# Patient Record
Sex: Male | Born: 1940 | Race: White | Hispanic: No | Smoking: Former smoker
Health system: Southern US, Community
[De-identification: ages and names within clinical notes are randomized; demographics above are authoritative.]

## PROBLEM LIST (undated history)

## (undated) DIAGNOSIS — E785 Hyperlipidemia, unspecified: Secondary | ICD-10-CM

## (undated) DIAGNOSIS — M1990 Unspecified osteoarthritis, unspecified site: Secondary | ICD-10-CM

## (undated) DIAGNOSIS — G4733 Obstructive sleep apnea (adult) (pediatric): Secondary | ICD-10-CM

## (undated) DIAGNOSIS — R0600 Dyspnea, unspecified: Secondary | ICD-10-CM

## (undated) DIAGNOSIS — R351 Nocturia: Secondary | ICD-10-CM

## (undated) DIAGNOSIS — I739 Peripheral vascular disease, unspecified: Secondary | ICD-10-CM

## (undated) DIAGNOSIS — G629 Polyneuropathy, unspecified: Secondary | ICD-10-CM

## (undated) DIAGNOSIS — I2510 Atherosclerotic heart disease of native coronary artery without angina pectoris: Secondary | ICD-10-CM

## (undated) DIAGNOSIS — N400 Enlarged prostate without lower urinary tract symptoms: Secondary | ICD-10-CM

## (undated) DIAGNOSIS — I10 Essential (primary) hypertension: Secondary | ICD-10-CM

## (undated) DIAGNOSIS — R251 Tremor, unspecified: Secondary | ICD-10-CM

## (undated) DIAGNOSIS — C4490 Unspecified malignant neoplasm of skin, unspecified: Secondary | ICD-10-CM

## (undated) DIAGNOSIS — R0989 Other specified symptoms and signs involving the circulatory and respiratory systems: Secondary | ICD-10-CM

## (undated) HISTORY — DX: Nocturia: R35.1

## (undated) HISTORY — DX: Peripheral vascular disease, unspecified: I73.9

## (undated) HISTORY — DX: Unspecified osteoarthritis, unspecified site: M19.90

## (undated) HISTORY — DX: Tremor, unspecified: R25.1

## (undated) HISTORY — DX: Benign prostatic hyperplasia without lower urinary tract symptoms: N40.0

## (undated) HISTORY — DX: Hyperlipidemia, unspecified: E78.5

## (undated) HISTORY — PX: CARDIAC CATHETERIZATION: SHX172

## (undated) HISTORY — DX: Other specified symptoms and signs involving the circulatory and respiratory systems: R09.89

## (undated) HISTORY — DX: Essential (primary) hypertension: I10

## (undated) HISTORY — DX: Obstructive sleep apnea (adult) (pediatric): G47.33

## (undated) HISTORY — DX: Polyneuropathy, unspecified: G62.9

## (undated) HISTORY — DX: Unspecified malignant neoplasm of skin, unspecified: C44.90

---

## 2004-03-24 HISTORY — PX: CORONARY ARTERY BYPASS GRAFT: SHX141

## 2004-08-20 ENCOUNTER — Ambulatory Visit: Payer: Self-pay | Admitting: Internal Medicine

## 2005-04-09 ENCOUNTER — Ambulatory Visit: Payer: Self-pay | Admitting: Internal Medicine

## 2005-04-14 ENCOUNTER — Ambulatory Visit: Payer: Self-pay | Admitting: Internal Medicine

## 2005-05-09 ENCOUNTER — Ambulatory Visit: Payer: Self-pay | Admitting: *Deleted

## 2005-05-22 ENCOUNTER — Ambulatory Visit: Payer: Self-pay | Admitting: Family Medicine

## 2005-06-02 ENCOUNTER — Ambulatory Visit: Payer: Self-pay | Admitting: *Deleted

## 2006-08-05 ENCOUNTER — Ambulatory Visit: Payer: Self-pay | Admitting: Vascular Surgery

## 2006-09-30 ENCOUNTER — Ambulatory Visit: Payer: Self-pay | Admitting: Vascular Surgery

## 2007-04-23 ENCOUNTER — Ambulatory Visit: Payer: Self-pay | Admitting: Urology

## 2008-11-13 ENCOUNTER — Other Ambulatory Visit: Payer: Self-pay | Admitting: Internal Medicine

## 2008-11-21 ENCOUNTER — Ambulatory Visit: Payer: Self-pay | Admitting: Internal Medicine

## 2011-09-30 HISTORY — PX: CAROTID ARTERY ANGIOPLASTY: SHX1300

## 2012-01-05 ENCOUNTER — Ambulatory Visit: Payer: Self-pay | Admitting: Vascular Surgery

## 2012-01-05 LAB — BASIC METABOLIC PANEL
Anion Gap: 7 (ref 7–16)
Calcium, Total: 9 mg/dL (ref 8.5–10.1)
EGFR (African American): >60
EGFR (Non-African Amer.): >60
Glucose: 152 mg/dL — ABNORMAL HIGH (ref 65–99)
Osmolality: 288 (ref 275–301)
Potassium: 3.8 mmol/L (ref 3.5–5.1)

## 2013-07-30 LAB — LIPID PANEL
CHOLESTEROL: 115 mg/dL (ref 0–200)
HDL: 31 mg/dL — AB (ref 35–70)
LDL Cholesterol: 63 mg/dL
TRIGLYCERIDES: 104 mg/dL (ref 40–160)

## 2013-07-30 LAB — HEPATIC FUNCTION PANEL
ALK PHOS: 71 U/L (ref 25–125)
ALT: 25 U/L (ref 10–40)
AST: 23 U/L (ref 14–40)
Bilirubin, Direct: 0.5 mg/dL — AB
Bilirubin, Total: 0.5 mg/dL

## 2013-07-30 LAB — BASIC METABOLIC PANEL
BUN: 15 mg/dL (ref 4–21)
Creatinine: 1.2 mg/dL (ref <=1.3)
GLUCOSE: 186 mg/dL
Potassium: 3.9 mmol/L (ref 3.4–5.3)
Sodium: 142 mmol/L (ref 137–147)

## 2013-08-16 DIAGNOSIS — R0602 Shortness of breath: Secondary | ICD-10-CM | POA: Insufficient documentation

## 2013-08-16 DIAGNOSIS — I2510 Atherosclerotic heart disease of native coronary artery without angina pectoris: Secondary | ICD-10-CM | POA: Insufficient documentation

## 2013-08-16 DIAGNOSIS — R079 Chest pain, unspecified: Secondary | ICD-10-CM | POA: Insufficient documentation

## 2013-08-16 DIAGNOSIS — E1122 Type 2 diabetes mellitus with diabetic chronic kidney disease: Secondary | ICD-10-CM | POA: Insufficient documentation

## 2013-08-16 DIAGNOSIS — R011 Cardiac murmur, unspecified: Secondary | ICD-10-CM | POA: Insufficient documentation

## 2013-08-16 DIAGNOSIS — E1142 Type 2 diabetes mellitus with diabetic polyneuropathy: Secondary | ICD-10-CM | POA: Insufficient documentation

## 2013-08-16 DIAGNOSIS — N183 Chronic kidney disease, stage 3 unspecified: Secondary | ICD-10-CM | POA: Insufficient documentation

## 2013-08-18 DIAGNOSIS — R42 Dizziness and giddiness: Secondary | ICD-10-CM | POA: Insufficient documentation

## 2013-08-18 DIAGNOSIS — R262 Difficulty in walking, not elsewhere classified: Secondary | ICD-10-CM | POA: Insufficient documentation

## 2013-12-06 ENCOUNTER — Encounter: Payer: Self-pay | Admitting: Neurology

## 2013-12-22 ENCOUNTER — Encounter: Payer: Self-pay | Admitting: Neurology

## 2014-05-15 ENCOUNTER — Encounter: Payer: Self-pay | Admitting: Family Medicine

## 2014-05-23 ENCOUNTER — Encounter
Admit: 2014-05-23 | Disposition: A | Discharge status: Home / Self Care | Payer: Self-pay | Attending: Family Medicine | Admitting: Family Medicine

## 2014-06-23 LAB — HEMOGLOBIN A1C: HEMOGLOBIN A1C: 7.1 % — AB (ref 4.0–6.0)

## 2014-07-11 NOTE — Op Note (Signed)
PATIENT NAME:  Randy Howard, Randy Howard MR#:  [id number] DATE OF BIRTH:  [birth year]  DATE OF PROCEDURE:  01/05/2012  PREOPERATIVE DIAGNOSES:  1. 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  2. Multiple previous interventions for peripheral arterial disease.  3. Coronary artery disease.  4. Diabetes.  5. Hypertension.   POSTOPERATIVE DIAGNOSES:  1. 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  2. Multiple previous interventions for peripheral arterial disease.  3. Coronary artery disease.  4. Diabetes.  5. Hypertension.   PROCEDURES:  1. Ultrasound guidance for vascular access, left femoral artery.  2. Catheter placement into right popliteal artery from left femoral approach.  3. Aortogram and selective right lower extremity angiogram.  4. Percutaneous transluminal angioplasty of distal superficial femoral artery and above-knee popliteal artery on the right with 5 mm diameter angioplasty balloon.  5. StarClose closure device left femoral artery.   SURGEON: Algernon Huxley, MD  ANESTHESIA: Local with moderate conscious sedation.   ESTIMATED BLOOD LOSS: Approximately 25 mL.   FLUOROSCOPY TIME: 13.2 minutes.   CONTRAST USED: 75 mL.   INDICATION FOR PROCEDURE: This is a gentleman I have known for some time for his peripheral vascular disease. We follow him for a small abdominal aortic aneurysm, carotid disease as well as his peripheral arterial disease. He has had worsening claudication and has now become very limiting, worse on the right and his noninvasive study showed a decreased perfusion. He is brought to the angiography suite for further evaluation and possible treatment. Risks and benefits are discussed. Informed consent is obtained.   DESCRIPTION OF PROCEDURE: Patient is brought to the vascular interventional radiology suite. Groins shaved and prepped, sterile surgical field was  created. Due to the calcified nature of the left common femoral artery ultrasound is used to access above a very densely diseased area in the proximal left common femoral artery and permanent image is recorded. J-wire and 5 French sheath are placed. Pigtail catheter was placed in the aorta at the L1 level. AP aortogram was performed. This showed some mild aneurysmal degeneration of the aorta with heavy calcification but no flow-limiting stenosis in the aorta or iliac segments. I then hooked the aortic bifurcation with a rim catheter, advanced with an Advantage wire down to the right common femoral artery. This demonstrated a very high-grade stenosis in the right common femoral artery extending to the origins of both the SFA and the profunda femoris artery. The SFA was then patent down to just above Hunter's canal where there is an approximately 80% stenosis over a short segment down to the above-knee popliteal artery. The below-knee popliteal artery was patent and there was one vessel runoff to the foot through the peroneal artery. The patient was systemically heparinized. A 6 French high flex Ansel sheath was placed over the wire. I was able to navigate through the common femoral stenosis carefully with a moderate amount of difficulty and get access into the superficial femoral artery. I elected to go ahead treat this lesion percutaneously but the common femoral lesion was not a lesion throughout should be treated percutaneously and will be best served by right femoral endarterectomy. Balloon angioplasty was performed with 5 mm diameter angioplasty balloon with mild residual stenosis of 30% which I elected to leave and not place a stent. At this point the sheath was pulled back to the left iliac artery and LAO and RAO projections were performed. This showed a high-grade stenosis of the  common femoral artery as well on the left just below our access sites. We did perform a StarClose closure device as this lesion was  in the 80% range and appeared to spare the origins of the SFA and profunda. After StarClose was placed the patient's hemostasis was achieved and he was taken to the recovery room in stable condition.   ____________________________ Algernon Huxley, MD jsd:cms D: 01/05/2012 09:23:39 ET T: 01/05/2012 10:26:24 ET JOB#: [id number]  cc: Algernon Huxley, MD, <Dictator> Algernon Huxley MD ELECTRONICALLY SIGNED 01/05/2012 13:35

## 2014-09-06 ENCOUNTER — Other Ambulatory Visit: Payer: Self-pay | Admitting: Family Medicine

## 2014-09-06 MED ORDER — MECLIZINE HCL 32 MG PO TABS: 32.0000 mg | ORAL_TABLET | Freq: Three times a day (TID) | ORAL | Status: DC | PRN

## 2014-10-04 ENCOUNTER — Other Ambulatory Visit: Payer: Self-pay | Admitting: Family Medicine

## 2014-10-04 MED ORDER — MECLIZINE HCL 32 MG PO TABS: 32.0000 mg | ORAL_TABLET | Freq: Three times a day (TID) | ORAL | Status: DC | PRN

## 2014-10-06 ENCOUNTER — Ambulatory Visit (INDEPENDENT_AMBULATORY_CARE_PROVIDER_SITE_OTHER): Payer: Medicare HMO | Admitting: Family Medicine

## 2014-10-06 ENCOUNTER — Encounter: Payer: Self-pay | Admitting: Family Medicine

## 2014-10-06 VITALS — BP 135/70 | HR 65 | Temp 97.4°F | Resp 16 | Ht 71.0 in | Wt 174.4 lb

## 2014-10-06 DIAGNOSIS — E11610 Type 2 diabetes mellitus with diabetic neuropathic arthropathy: Secondary | ICD-10-CM

## 2014-10-06 DIAGNOSIS — R600 Localized edema: Secondary | ICD-10-CM

## 2014-10-06 DIAGNOSIS — R42 Dizziness and giddiness: Secondary | ICD-10-CM | POA: Diagnosis not present

## 2014-10-06 DIAGNOSIS — E7800 Pure hypercholesterolemia, unspecified: Secondary | ICD-10-CM | POA: Insufficient documentation

## 2014-10-06 DIAGNOSIS — I10 Essential (primary) hypertension: Secondary | ICD-10-CM | POA: Insufficient documentation

## 2014-10-06 DIAGNOSIS — I739 Peripheral vascular disease, unspecified: Secondary | ICD-10-CM | POA: Diagnosis not present

## 2014-10-06 DIAGNOSIS — I872 Venous insufficiency (chronic) (peripheral): Secondary | ICD-10-CM | POA: Insufficient documentation

## 2014-10-06 LAB — POCT GLYCOSYLATED HEMOGLOBIN (HGB A1C): Hemoglobin A1C: 6.9

## 2014-10-06 MED ORDER — MECLIZINE HCL 25 MG PO TABS: 25.0000 mg | ORAL_TABLET | Freq: Three times a day (TID) | ORAL | Status: DC | PRN

## 2014-10-06 NOTE — Patient Instructions (Signed)
Keep appt. With Vascular MD and Card.  Cont. All current medications.

## 2014-10-06 NOTE — Progress Notes (Signed)
Name: Randy Howard   MRN: [id number]    DOB: [birth year]   Date:10/06/2014       Progress Note  Subjective  Chief Complaint  Chief Complaint  Patient presents with  . Diabetes    Patient does not check blood sugar at home.   . Edema    Patient states that Dr. Clayborn Bigness stopped HCTZ, started Lasix.  Marland Kitchen Hyperlipidemia    Patient reports that Dr. Clayborn Bigness also stopped Lovastatin for the next three weeks  . Dizziness    Patient states that he has been dizzy and he has been out his out his meclizine for the last 3 weeks.     HPI  Here for f/u of DM and HBP.  Has seen Dr. Clayborn Bigness and he stopped his Lovastatin and added Lasix for edema.  Pt. Stopped HCTZ on his own..  BSs not checked at home. C/o continued periodic dizzness.   Has appt. With Vascular surgeon next week.   No problem-specific assessment & plan notes found for this encounter.   Past Medical History  Diagnosis Date  . Diabetes type 2, controlled   . Nocturia   . Elevated lipids   . Hypertension   . Prostate enlargement   . Peripheral vascular disease   . Obstructive sleep apnea   . Skin cancer     Followed by Dr. Evorn Gong  . Neuropathy   . Right carotid bruit   . Tremor     Past Surgical History  Procedure Laterality Date  . Coronary artery bypass graft  2006  . Carotid artery angioplasty Left 09/30/2011    Family History  Problem Relation Age of Onset  . Heart disease Sister   . Heart disease Sister   . Diabetes Sister   . Diabetes Sister   . Diabetes Brother     History   Social History  . Marital Status: Widowed    Spouse Name: N/A  . Number of Children: N/A  . Years of Education: N/A   Occupational History  . Not on file.   Social History Main Topics  . Smoking status: Former Smoker    Quit date: 03/24/1978  . Smokeless tobacco: Not on file  . Alcohol Use: No  . Drug Use: No  . Sexual Activity: Not on file   Other Topics Concern  . Not on file   Social History Narrative      Current outpatient prescriptions:  .  amLODipine (NORVASC) 5 MG tablet, Take 5 mg by mouth daily., Disp: , Rfl:  .  aspirin 81 MG tablet, Take 81 mg by mouth daily., Disp: , Rfl:  .  baclofen (LIORESAL) 10 MG tablet, Take 10 mg by mouth 3 (three) times daily as needed for muscle spasms., Disp: , Rfl:  .  benazepril (LOTENSIN) 40 MG tablet, Take 40 mg by mouth daily., Disp: , Rfl:  .  clopidogrel (PLAVIX) 75 MG tablet, Take 75 mg by mouth daily., Disp: , Rfl:  .  etodolac (LODINE) 300 MG capsule, Take 300 mg by mouth 2 (two) times daily as needed., Disp: , Rfl:  .  finasteride (PROSCAR) 5 MG tablet, Take 5 mg by mouth daily., Disp: , Rfl:  .  furosemide (LASIX) 20 MG tablet, Take 1 tablet by mouth daily., Disp: , Rfl:  .  gabapentin (NEURONTIN) 100 MG capsule, Take 100 mg by mouth 3 (three) times daily., Disp: , Rfl:  .  glimepiride (AMARYL) 4 MG tablet, Take 2 mg by mouth daily with  breakfast., Disp: , Rfl:  .  meclizine (ANTIVERT) 32 MG tablet, Take 1 tablet (32 mg total) by mouth 3 (three) times daily as needed for dizziness., Disp: 30 tablet, Rfl: 1 .  metFORMIN (GLUCOPHAGE) 1000 MG tablet, Take 1,000 mg by mouth 2 (two) times daily with a meal., Disp: , Rfl:  .  metoprolol (LOPRESSOR) 50 MG tablet, Take 50 mg by mouth 2 (two) times daily., Disp: , Rfl:  .  tamsulosin (FLOMAX) 0.4 MG CAPS capsule, Take 1 capsule by mouth daily., Disp: , Rfl:   Allergies  Allergen Reactions  . Oxycodone Nausea Only     Review of Systems  Constitutional: Negative for fever, chills and malaise/fatigue.  Eyes: Positive for blurred vision. Negative for double vision.  Respiratory: Negative for cough, shortness of breath and wheezing.   Cardiovascular: Positive for leg swelling. Negative for chest pain, palpitations and orthopnea.  Gastrointestinal: Negative for abdominal pain and blood in stool.  Genitourinary: Negative for dysuria, urgency and frequency.  Skin: Negative for rash.  Neurological:  Positive for dizziness. Negative for weakness and headaches.       Objective  Filed Vitals:   10/06/14 1010  BP: 146/75  Pulse: 65  Temp: 97.4 F (36.3 C)  TempSrc: Oral  Resp: 16  Height: 5\' 11"  (1.803 m)  Weight: 174 lb 6.4 oz (79.107 kg)    Physical Exam  Constitutional: He is oriented to person, place, and time and well-developed, well-nourished, and in no distress. No distress.  HENT:  Head: Normocephalic and atraumatic.  Eyes: Conjunctivae and EOM are normal. Pupils are equal, round, and reactive to light.  Neck: Normal range of motion. Neck supple. Carotid bruit is present (R. harsh bruit). No thyromegaly present.  Cardiovascular: Normal rate, regular rhythm and normal heart sounds.  Exam reveals no gallop and no friction rub.   No murmur heard. Pulmonary/Chest: Effort normal and breath sounds normal. No respiratory distress. He has no wheezes. He has no rales.  Abdominal: Soft. Bowel sounds are normal. He exhibits no distension and no mass. There is no tenderness.  Musculoskeletal: He exhibits edema (1+ edena R. foot and ankle; trace edema L. foot and ankle.).  Lymphadenopathy:    He has no cervical adenopathy.  Neurological: He is alert and oriented to person, place, and time.  Dizziness with head motion.        Recent Results (from the past 2160 hour(s))  POCT A1C     Status: None   Collection Time: 10/06/14 10:35 AM  Result Value Ref Range   Hemoglobin A1C 6.9      Assessment & Plan  Problem List Items Addressed This Visit      Cardiovascular and Mediastinum   Hypertension   Relevant Medications   furosemide (LASIX) 20 MG tablet   Peripheral vascular disease   Relevant Medications   furosemide (LASIX) 20 MG tablet     Endocrine   Diabetes - Primary   Relevant Orders   POCT A1C (Completed)     Other   Elevated cholesterol   Relevant Medications   furosemide (LASIX) 20 MG tablet   Pedal edema      Meds ordered this encounter   Medications  . furosemide (LASIX) 20 MG tablet    Sig: Take 1 tablet by mouth daily.  . tamsulosin (FLOMAX) 0.4 MG CAPS capsule    Sig: Take 1 capsule by mouth daily.   1. Type 2 diabetes mellitus with diabetic neuropathic arthropathy  - POCT A1C-6.9 -Cont.  Meds. 2. Essential hypertension -continue meds  3. Elevated cholesterol   4. Pedal edema -Cont. meds 5. Peripheral vascular disease   6. Vertigo  - meclizine (ANTIVERT) 25 MG tablet; Take 1 tablet (25 mg total) by mouth 3 (three) times daily as needed for dizziness.  Dispense: 60 tablet; Refill: 6

## 2014-10-09 ENCOUNTER — Encounter: Payer: Self-pay | Admitting: *Deleted

## 2014-10-09 ENCOUNTER — Encounter
Admission: RE | Disposition: A | Discharge status: Home / Self Care | Payer: Self-pay | Source: Ambulatory Visit | Attending: Vascular Surgery

## 2014-10-09 ENCOUNTER — Ambulatory Visit
Admission: RE | Admit: 2014-10-09 | Discharge: 2014-10-09 | Disposition: A | Discharge status: Home / Self Care | Payer: Medicare HMO | Source: Ambulatory Visit | Attending: Vascular Surgery | Admitting: Vascular Surgery

## 2014-10-09 DIAGNOSIS — I10 Essential (primary) hypertension: Secondary | ICD-10-CM | POA: Insufficient documentation

## 2014-10-09 DIAGNOSIS — I70213 Atherosclerosis of native arteries of extremities with intermittent claudication, bilateral legs: Secondary | ICD-10-CM | POA: Insufficient documentation

## 2014-10-09 DIAGNOSIS — I714 Abdominal aortic aneurysm, without rupture: Secondary | ICD-10-CM | POA: Insufficient documentation

## 2014-10-09 DIAGNOSIS — Z7982 Long term (current) use of aspirin: Secondary | ICD-10-CM | POA: Diagnosis not present

## 2014-10-09 DIAGNOSIS — R0989 Other specified symptoms and signs involving the circulatory and respiratory systems: Secondary | ICD-10-CM | POA: Insufficient documentation

## 2014-10-09 DIAGNOSIS — E119 Type 2 diabetes mellitus without complications: Secondary | ICD-10-CM | POA: Insufficient documentation

## 2014-10-09 DIAGNOSIS — I6529 Occlusion and stenosis of unspecified carotid artery: Secondary | ICD-10-CM | POA: Diagnosis not present

## 2014-10-09 DIAGNOSIS — Z79899 Other long term (current) drug therapy: Secondary | ICD-10-CM | POA: Diagnosis not present

## 2014-10-09 DIAGNOSIS — E785 Hyperlipidemia, unspecified: Secondary | ICD-10-CM | POA: Insufficient documentation

## 2014-10-09 DIAGNOSIS — I2510 Atherosclerotic heart disease of native coronary artery without angina pectoris: Secondary | ICD-10-CM | POA: Insufficient documentation

## 2014-10-09 HISTORY — DX: Atherosclerotic heart disease of native coronary artery without angina pectoris: I25.10

## 2014-10-09 HISTORY — PX: PERIPHERAL VASCULAR CATHETERIZATION: SHX172C

## 2014-10-09 HISTORY — DX: Peripheral vascular disease, unspecified: I73.9

## 2014-10-09 LAB — BASIC METABOLIC PANEL
Anion gap: 7 (ref 5–15)
BUN: 22 mg/dL — ABNORMAL HIGH (ref 6–20)
CHLORIDE: 101 mmol/L (ref 101–111)
CO2: 30 mmol/L (ref 22–32)
CREATININE: 1.31 mg/dL — AB (ref 0.61–1.24)
Calcium: 9.1 mg/dL (ref 8.9–10.3)
GFR calc Af Amer: 60 mL/min — ABNORMAL LOW (ref >60)
GFR calc non Af Amer: 52 mL/min — ABNORMAL LOW (ref >60)
Glucose, Bld: 162 mg/dL — ABNORMAL HIGH (ref 65–99)
Potassium: 3.9 mmol/L (ref 3.5–5.1)
Sodium: 138 mmol/L (ref 135–145)

## 2014-10-09 SURGERY — LOWER EXTREMITY ANGIOGRAPHY
Anesthesia: Moderate Sedation | Laterality: Left

## 2014-10-09 MED ORDER — MIDAZOLAM HCL 5 MG/5ML IJ SOLN
INTRAMUSCULAR | Status: AC
  Filled 2014-10-09: qty 5

## 2014-10-09 MED ORDER — CEFAZOLIN SODIUM 1-5 GM-% IV SOLN
1.0000 g | Freq: Once | INTRAVENOUS | Status: AC
  Administered 2014-10-09: 1 g via INTRAVENOUS

## 2014-10-09 MED ORDER — SODIUM CHLORIDE 0.9 % IV SOLN
INTRAVENOUS | Status: DC
  Administered 2014-10-09: 08:00:00 via INTRAVENOUS

## 2014-10-09 MED ORDER — HEPARIN SODIUM (PORCINE) 1000 UNIT/ML IJ SOLN
INTRAMUSCULAR | Status: AC
  Filled 2014-10-09: qty 1

## 2014-10-09 MED ORDER — FENTANYL CITRATE (PF) 100 MCG/2ML IJ SOLN
INTRAMUSCULAR | Status: AC
  Filled 2014-10-09: qty 2

## 2014-10-09 MED ORDER — HEPARIN (PORCINE) IN NACL 2-0.9 UNIT/ML-% IJ SOLN
INTRAMUSCULAR | Status: AC
  Filled 2014-10-09: qty 1000

## 2014-10-09 MED ORDER — SODIUM CHLORIDE 0.9 % IJ SOLN
INTRAMUSCULAR | Status: AC
  Filled 2014-10-09: qty 15

## 2014-10-09 MED ORDER — FENTANYL CITRATE (PF) 100 MCG/2ML IJ SOLN
INTRAMUSCULAR | Status: DC | PRN
  Administered 2014-10-09 (×2): 50 ug via INTRAVENOUS

## 2014-10-09 MED ORDER — LIDOCAINE-EPINEPHRINE (PF) 1 %-1:200000 IJ SOLN
INTRAMUSCULAR | Status: AC
  Filled 2014-10-09: qty 30

## 2014-10-09 MED ORDER — IOHEXOL 300 MG/ML  SOLN
INTRAMUSCULAR | Status: DC | PRN
  Administered 2014-10-09: 60 mL via INTRAVENOUS
  Administered 2014-10-09: 100 mL via INTRAVENOUS

## 2014-10-09 MED ORDER — MIDAZOLAM HCL 2 MG/2ML IJ SOLN
INTRAMUSCULAR | Status: DC | PRN
  Administered 2014-10-09 (×2): 2 mg via INTRAVENOUS

## 2014-10-09 SURGICAL SUPPLY — 7 items
CATH ROYAL FLUSH PIG 5F 70CM (CATHETERS) ×3 IMPLANT
GLIDEWIRE ADV .035X260CM (WIRE) ×3 IMPLANT
PACK ANGIOGRAPHY (CUSTOM PROCEDURE TRAY) ×3 IMPLANT
SHEATH BRITE TIP 5FRX11 (SHEATH) ×3 IMPLANT
SYR MEDRAD MARK V 150ML (SYRINGE) ×3 IMPLANT
TUBING CONTRAST HIGH PRESS 72 (TUBING) ×3 IMPLANT
WIRE J 3MM .035X145CM (WIRE) ×3 IMPLANT

## 2014-10-09 NOTE — Op Note (Signed)
[facility] Percutaneous Study/Intervention Procedural Note   Date of Surgery: 10/09/2014  Surgeon(s):Danile Trier   Assistants:none  Pre-operative Diagnosis: PAD with claudication bilateral lower extremities  Post-operative diagnosis: Same  Procedure(s) Performed: 1. Ultrasound guidance for vascular access right femoral artery 2. Catheter placement into left common femoral artery from right femoral approach 3. Aortogram and selective bilateral lower extremity angiograms   EBL: 25 cc   Indications: Patient is a 74 year old male with a known history of severe peripheral vascular disease. He is status post interventions to bilateral lower extremities previously. The patient has noninvasive study showing further reduction in his ABIs he now has symptoms of very short distance claudication and inability to walk even a few feet without pain. The patient is brought in for angiography for further evaluation and potential treatment. Risks and benefits are discussed and informed consent is obtained  Procedure: The patient was identified and appropriate procedural time out was performed. The patient was then placed supine on the table and prepped and draped in the usual sterile fashion. Ultrasound was used to evaluate the right common femoral artery. It was heavily diseased and I access this in the proximal common femoral artery under ultrasound guidance . A digital ultrasound image was acquired. A Seldinger needle was used to access the proximal right common femoral artery under direct ultrasound guidance and a permanent image was performed. A 0.035 J wire was advanced without resistance and a 5Fr sheath was placed. Pigtail catheter was placed into the aorta and an AP aortogram was performed. This demonstrated what appeared to be at least a moderate left renal artery stenosis, the right renal artery was not  particularly well opacified, and a mildly aneurysmal aorta and tortuous iliac segments without significant stenosis but with significant calcification. I then crossed the aortic bifurcation and advanced to the left femoral head. Selective left lower extremity angiogram was then performed. This demonstrated heavily calcified left common femoral artery with short segment occlusion extending into the proximal profunda femoris artery and superficial femoral artery. The remainder of the superficial femoral artery beyond the first several centimeters appeared patent with several areas of less than 50% stenosis. The previously placed stent was patent. He then had one-vessel runoff distally with mild to moderate stenosis at the tibial origin. This was not a lesion that was appropriate to be treated with percutaneous therapy, and he will need a femoral endarterectomy to treat his left-sided disease. I then removed the wire and performed images of the right leg through the right femoral sheath. The right common femoral artery was occluded just below the access site. The profunda femoris artery really did not reconstitute distally that I can appreciate on the right. The first several centimeters of the superficial femoral artery were occluded. The superficial femoral artery was then continuous and he again had one-vessel runoff distally without apparent greater than 50% stenosis downstream. Again, this was a lesion not appropriate for percutaneous therapy. A right femoral endarterectomy will be required as well. I elected to terminate the procedure. The sheath was removed and pressure was held on the right groin for hemostasis. The patient was taken to the recovery room in stable condition having tolerated the procedure well.  Findings:  Aortogram: Mildly aneurysmal aorta with highly calcific aorta and iliac segments but no flow limiting stenosis. Left renal artery stenosis did appear to be  present. bilateral Lower Extremities: Severe common femoral disease with occlusion and lesions extending into the proximal SFA and profunda femoris arteries, worse  on the right than the left. One-vessel runoff distally.   Disposition: Patient was taken to the recovery room in stable condition having tolerated the procedure well.  Complications: None  Randy Howard 10/09/2014 8:38 AM

## 2014-10-09 NOTE — H&P (Signed)
[facility] History & Physical Update  The patient was interviewed and re-examined.  The patient's previous History and Physical has been reviewed and is unchanged.  There is no change in the plan of care. We plan to proceed with the scheduled procedure.  DEW,JASON, MD  10/09/2014, 8:07 AM

## 2014-10-09 NOTE — Discharge Instructions (Signed)

## 2014-10-10 ENCOUNTER — Encounter: Payer: Self-pay | Admitting: Vascular Surgery

## 2014-10-24 ENCOUNTER — Telehealth: Payer: Self-pay | Admitting: Family Medicine

## 2014-10-24 ENCOUNTER — Encounter
Admission: RE | Admit: 2014-10-24 | Discharge: 2014-10-24 | Disposition: A | Discharge status: Home / Self Care | Payer: Commercial Managed Care - HMO | Source: Ambulatory Visit | Attending: Vascular Surgery | Admitting: Vascular Surgery

## 2014-10-24 DIAGNOSIS — E119 Type 2 diabetes mellitus without complications: Secondary | ICD-10-CM | POA: Diagnosis not present

## 2014-10-24 DIAGNOSIS — I739 Peripheral vascular disease, unspecified: Secondary | ICD-10-CM | POA: Insufficient documentation

## 2014-10-24 DIAGNOSIS — Z01812 Encounter for preprocedural laboratory examination: Secondary | ICD-10-CM | POA: Insufficient documentation

## 2014-10-24 DIAGNOSIS — Z833 Family history of diabetes mellitus: Secondary | ICD-10-CM | POA: Insufficient documentation

## 2014-10-24 DIAGNOSIS — Z8249 Family history of ischemic heart disease and other diseases of the circulatory system: Secondary | ICD-10-CM | POA: Insufficient documentation

## 2014-10-24 DIAGNOSIS — Z01810 Encounter for preprocedural cardiovascular examination: Secondary | ICD-10-CM | POA: Insufficient documentation

## 2014-10-24 DIAGNOSIS — E780 Pure hypercholesterolemia: Secondary | ICD-10-CM | POA: Diagnosis not present

## 2014-10-24 DIAGNOSIS — R600 Localized edema: Secondary | ICD-10-CM | POA: Diagnosis not present

## 2014-10-24 DIAGNOSIS — I2510 Atherosclerotic heart disease of native coronary artery without angina pectoris: Secondary | ICD-10-CM | POA: Insufficient documentation

## 2014-10-24 DIAGNOSIS — I10 Essential (primary) hypertension: Secondary | ICD-10-CM | POA: Diagnosis not present

## 2014-10-24 LAB — TYPE AND SCREEN
ABO/RH(D): O POS
Antibody Screen: NEGATIVE

## 2014-10-24 LAB — BASIC METABOLIC PANEL
Anion gap: 7 (ref 5–15)
BUN: 26 mg/dL — AB (ref 6–20)
CALCIUM: 9.7 mg/dL (ref 8.9–10.3)
CO2: 30 mmol/L (ref 22–32)
CREATININE: 1.02 mg/dL (ref 0.61–1.24)
Chloride: 103 mmol/L (ref 101–111)
GFR calc Af Amer: >60 mL/min (ref >60)
GLUCOSE: 223 mg/dL — AB (ref 65–99)
POTASSIUM: 4 mmol/L (ref 3.5–5.1)
Sodium: 140 mmol/L (ref 135–145)

## 2014-10-24 LAB — CBC
HEMATOCRIT: 40 % (ref 40.0–52.0)
Hemoglobin: 13.4 g/dL (ref 13.0–18.0)
MCH: 30.6 pg (ref 26.0–34.0)
MCHC: 33.5 g/dL (ref 32.0–36.0)
MCV: 91.2 fL (ref 80.0–100.0)
PLATELETS: 139 10*3/uL — AB (ref 150–440)
RBC: 4.39 MIL/uL — AB (ref 4.40–5.90)
RDW: 13 % (ref 11.5–14.5)
WBC: 7.3 10*3/uL (ref 3.8–10.6)

## 2014-10-24 LAB — PROTIME-INR
INR: 1.21
PROTHROMBIN TIME: 15.5 s — AB (ref 11.4–15.0)

## 2014-10-24 LAB — ABO/RH: ABO/RH(D): O POS

## 2014-10-24 LAB — APTT: aPTT: 30 seconds (ref 24–36)

## 2014-10-24 NOTE — Patient Instructions (Addendum)
  Your procedure is scheduled GG:[id number] 11/01/2014 (ARRIVE AT 7:30) Report to Day Surgery. 2ND FLOOR MEDICAL MALL ENTRANCE To find out your arrival time please call [phone] between 1PM - 3PM on TUESDAY 10/31/2014.  Remember: Instructions that are not followed completely may result in serious medical risk, up to and including death, or upon the discretion of your surgeon and anesthesiologist your surgery may need to be rescheduled.    __X__ 1. Do not eat food or drink liquids after midnight. No gum chewing or hard candies.     __X__ 2. No Alcohol for 24 hours before or after surgery.   ____ 3. Bring all medications with you on the day of surgery if instructed.    __X__ 4. Notify your doctor if there is any change in your medical condition     (cold, fever, infections).     Do not wear jewelry, make-up, hairpins, clips or nail polish.  Do not wear lotions, powders, or perfumes.   Do not shave 48 hours prior to surgery. Men may shave face and neck.  Do not bring valuables to the hospital.    [facility] is not responsible for any belongings or valuables.               Contacts, dentures or bridgework may not be worn into surgery.  Leave your suitcase in the car. After surgery it may be brought to your room.  For patients admitted to the hospital, discharge time is determined by your                treatment team.   Patients discharged the day of surgery will not be allowed to drive home.   Please read over the following fact sheets that you were given:   Surgical Site Infection Prevention   __X__ Take these medicines the morning of surgery with A SIP OF WATER:    1. AMLODIPINE  2. BENAZEPRIL  3. GABAPENTIN  4. METOPROLOL  5.  6.  ____ Fleet Enema (as directed)   __X__ Use CHG Soap as directed  ____ Use inhalers on the day of surgery  __X__ Stop metformin 2 days prior to surgery    ____ Take 1/2 of usual insulin dose the night before surgery and none on the morning  of surgery.   __X__ Stop /Plavix/ on 10/27/2014 YOU SHOULD CONTINUE YOUR ASPIRIN  __X__ Stop Anti-inflammatories on 10/25/2014 STOP ETODOLAC(LODINE)   ____ Stop supplements until after surgery.    ____ Bring C-Pap to the hospital.

## 2014-10-24 NOTE — Telephone Encounter (Signed)
Sherry in [place] at [facility] asked for a copy of pt's EKG if one was done here.  Her call back number is [phone] and fax [phone].

## 2014-10-24 NOTE — OR Nursing (Signed)
Request for clearance by Dr Rosey Bath  Faxed  To Dr Luan Pulling and called to [facility] at Dr Lucky Cowboy

## 2014-10-24 NOTE — Telephone Encounter (Signed)
Spoke to [place].

## 2014-10-25 ENCOUNTER — Other Ambulatory Visit: Payer: Self-pay | Admitting: Family Medicine

## 2014-10-25 DIAGNOSIS — Z01818 Encounter for other preprocedural examination: Secondary | ICD-10-CM

## 2014-10-27 ENCOUNTER — Ambulatory Visit: Payer: Medicare HMO | Admitting: Cardiovascular Disease

## 2014-10-31 ENCOUNTER — Telehealth: Payer: Self-pay | Admitting: Family Medicine

## 2014-10-31 NOTE — OR Nursing (Signed)
Dr Luan Pulling office cleared patient and will fax info

## 2014-10-31 NOTE — Telephone Encounter (Signed)
Judeen Hammans at [facility] anesthesiology is looking for a medical clearance for surgery for pt.  She said something was faxed over on 8/5.  Please call [phone]

## 2014-10-31 NOTE — Telephone Encounter (Signed)
Spoke to [place] and they will refax it.

## 2014-10-31 NOTE — Telephone Encounter (Signed)
Spoke to [place] he needed a cardiac clearance not medical for his surgery pt  had seen [facility].

## 2014-11-01 ENCOUNTER — Encounter
Admission: RE | Disposition: A | Discharge status: Skilled Nursing Facility | Payer: Self-pay | Source: Ambulatory Visit | Attending: Internal Medicine

## 2014-11-01 ENCOUNTER — Encounter: Payer: Self-pay | Admitting: Anesthesiology

## 2014-11-01 ENCOUNTER — Ambulatory Visit: Payer: Medicare HMO | Admitting: Anesthesiology

## 2014-11-01 ENCOUNTER — Inpatient Hospital Stay
Admission: RE | Admit: 2014-11-01 | Discharge: 2014-11-08 | DRG: 254 | Disposition: A | Discharge status: Skilled Nursing Facility | Payer: Medicare HMO | Source: Ambulatory Visit | Attending: Vascular Surgery | Admitting: Vascular Surgery

## 2014-11-01 DIAGNOSIS — Z951 Presence of aortocoronary bypass graft: Secondary | ICD-10-CM

## 2014-11-01 DIAGNOSIS — Z8249 Family history of ischemic heart disease and other diseases of the circulatory system: Secondary | ICD-10-CM

## 2014-11-01 DIAGNOSIS — G4733 Obstructive sleep apnea (adult) (pediatric): Secondary | ICD-10-CM | POA: Diagnosis present

## 2014-11-01 DIAGNOSIS — E1140 Type 2 diabetes mellitus with diabetic neuropathy, unspecified: Secondary | ICD-10-CM | POA: Diagnosis present

## 2014-11-01 DIAGNOSIS — Z9889 Other specified postprocedural states: Secondary | ICD-10-CM | POA: Diagnosis not present

## 2014-11-01 DIAGNOSIS — E1151 Type 2 diabetes mellitus with diabetic peripheral angiopathy without gangrene: Secondary | ICD-10-CM | POA: Diagnosis present

## 2014-11-01 DIAGNOSIS — Z85828 Personal history of other malignant neoplasm of skin: Secondary | ICD-10-CM | POA: Diagnosis not present

## 2014-11-01 DIAGNOSIS — Z833 Family history of diabetes mellitus: Secondary | ICD-10-CM | POA: Diagnosis not present

## 2014-11-01 DIAGNOSIS — I10 Essential (primary) hypertension: Secondary | ICD-10-CM | POA: Diagnosis present

## 2014-11-01 DIAGNOSIS — I743 Embolism and thrombosis of arteries of the lower extremities: Secondary | ICD-10-CM | POA: Diagnosis present

## 2014-11-01 DIAGNOSIS — I2510 Atherosclerotic heart disease of native coronary artery without angina pectoris: Secondary | ICD-10-CM | POA: Diagnosis present

## 2014-11-01 DIAGNOSIS — Z87891 Personal history of nicotine dependence: Secondary | ICD-10-CM | POA: Diagnosis not present

## 2014-11-01 DIAGNOSIS — N400 Enlarged prostate without lower urinary tract symptoms: Secondary | ICD-10-CM | POA: Diagnosis present

## 2014-11-01 DIAGNOSIS — Z885 Allergy status to narcotic agent status: Secondary | ICD-10-CM

## 2014-11-01 DIAGNOSIS — I7025 Atherosclerosis of native arteries of other extremities with ulceration: Secondary | ICD-10-CM | POA: Diagnosis present

## 2014-11-01 HISTORY — PX: ENDARTERECTOMY FEMORAL: SHX5804

## 2014-11-01 LAB — CBC
HEMATOCRIT: 38 % — AB (ref 40.0–52.0)
HEMOGLOBIN: 12.5 g/dL — AB (ref 13.0–18.0)
MCH: 30.6 pg (ref 26.0–34.0)
MCHC: 32.8 g/dL (ref 32.0–36.0)
MCV: 93.5 fL (ref 80.0–100.0)
PLATELETS: 132 10*3/uL — AB (ref 150–440)
RBC: 4.07 MIL/uL — ABNORMAL LOW (ref 4.40–5.90)
RDW: 13.5 % (ref 11.5–14.5)
WBC: 17.6 10*3/uL — ABNORMAL HIGH (ref 3.8–10.6)

## 2014-11-01 LAB — GLUCOSE, CAPILLARY
Glucose-Capillary: 162 mg/dL — ABNORMAL HIGH (ref 65–99)
Glucose-Capillary: 143 mg/dL — ABNORMAL HIGH (ref 65–99)
Glucose-Capillary: 244 mg/dL — ABNORMAL HIGH (ref 65–99)

## 2014-11-01 LAB — CREATININE, SERUM
CREATININE: 1.24 mg/dL (ref 0.61–1.24)
GFR calc non Af Amer: 55 mL/min — ABNORMAL LOW (ref >60)

## 2014-11-01 LAB — MRSA PCR SCREENING: MRSA by PCR: NEGATIVE

## 2014-11-01 SURGERY — ENDARTERECTOMY, FEMORAL
Anesthesia: General | Laterality: Bilateral

## 2014-11-01 MED ORDER — CEFAZOLIN SODIUM 1-5 GM-% IV SOLN: 1.0000 g | Freq: Once | INTRAVENOUS | Status: DC

## 2014-11-01 MED ORDER — TAMSULOSIN HCL 0.4 MG PO CAPS
0.4000 mg | ORAL_CAPSULE | Freq: Every day | ORAL | Status: DC
  Administered 2014-11-01 – 2014-11-08 (×8): 0.4 mg via ORAL
  Filled 2014-11-01 (×8): qty 1

## 2014-11-01 MED ORDER — ACETAMINOPHEN 325 MG PO TABS
325.0000 mg | ORAL_TABLET | ORAL | Status: DC | PRN
  Administered 2014-11-01: 325 mg via ORAL
  Filled 2014-11-01: qty 2

## 2014-11-01 MED ORDER — ASPIRIN 81 MG PO TABS
81.0000 mg | ORAL_TABLET | Freq: Every day | ORAL | Status: DC
  Administered 2014-11-02 – 2014-11-08 (×7): 81 mg via ORAL
  Filled 2014-11-01 (×16): qty 1

## 2014-11-01 MED ORDER — ALUM & MAG HYDROXIDE-SIMETH 200-200-20 MG/5ML PO SUSP: 15.0000 mL | ORAL | Status: DC | PRN

## 2014-11-01 MED ORDER — CEFAZOLIN SODIUM 1-5 GM-% IV SOLN
INTRAVENOUS | Status: AC
  Filled 2014-11-01: qty 50

## 2014-11-01 MED ORDER — SODIUM CHLORIDE 0.9 % IV SOLN
INTRAVENOUS | Status: DC
  Administered 2014-11-01: 11:00:00 via INTRAVENOUS

## 2014-11-01 MED ORDER — FENTANYL CITRATE (PF) 100 MCG/2ML IJ SOLN
INTRAMUSCULAR | Status: DC | PRN
  Administered 2014-11-01: 100 ug via INTRAVENOUS
  Administered 2014-11-01 (×3): 50 ug via INTRAVENOUS

## 2014-11-01 MED ORDER — SUCCINYLCHOLINE CHLORIDE 20 MG/ML IJ SOLN
INTRAMUSCULAR | Status: DC | PRN
  Administered 2014-11-01: 100 mg via INTRAVENOUS

## 2014-11-01 MED ORDER — ACETAMINOPHEN 650 MG RE SUPP: 325.0000 mg | RECTAL | Status: DC | PRN

## 2014-11-01 MED ORDER — MIDAZOLAM HCL 2 MG/2ML IJ SOLN
INTRAMUSCULAR | Status: DC | PRN
  Administered 2014-11-01: 2 mg via INTRAVENOUS

## 2014-11-01 MED ORDER — EPHEDRINE SULFATE 50 MG/ML IJ SOLN
INTRAMUSCULAR | Status: DC | PRN
  Administered 2014-11-01 (×2): 10 mg via INTRAVENOUS

## 2014-11-01 MED ORDER — VANCOMYCIN HCL IN DEXTROSE 1-5 GM/200ML-% IV SOLN
INTRAVENOUS | Status: AC
  Administered 2014-11-01: 1000 mg via INTRAVENOUS
  Filled 2014-11-01: qty 200

## 2014-11-01 MED ORDER — DOPAMINE-DEXTROSE 3.2-5 MG/ML-% IV SOLN: 3.0000 ug/kg/min | INTRAVENOUS | Status: DC

## 2014-11-01 MED ORDER — SENNOSIDES-DOCUSATE SODIUM 8.6-50 MG PO TABS
1.0000 | ORAL_TABLET | Freq: Every evening | ORAL | Status: DC | PRN
  Filled 2014-11-01: qty 1

## 2014-11-01 MED ORDER — LABETALOL HCL 5 MG/ML IV SOLN: 10.0000 mg | INTRAVENOUS | Status: DC | PRN

## 2014-11-01 MED ORDER — DOCUSATE SODIUM 100 MG PO CAPS
100.0000 mg | ORAL_CAPSULE | Freq: Every day | ORAL | Status: DC
  Administered 2014-11-02 – 2014-11-08 (×7): 100 mg via ORAL
  Filled 2014-11-01 (×7): qty 1

## 2014-11-01 MED ORDER — HEPARIN SODIUM (PORCINE) 5000 UNIT/ML IJ SOLN
INTRAMUSCULAR | Status: AC
  Filled 2014-11-01: qty 1

## 2014-11-01 MED ORDER — HEPARIN SODIUM (PORCINE) 1000 UNIT/ML IJ SOLN
INTRAMUSCULAR | Status: DC | PRN
  Administered 2014-11-01: 5 mL via INTRAVENOUS

## 2014-11-01 MED ORDER — GLIMEPIRIDE 2 MG PO TABS
2.0000 mg | ORAL_TABLET | Freq: Every day | ORAL | Status: DC
  Administered 2014-11-02 – 2014-11-08 (×7): 2 mg via ORAL
  Filled 2014-11-01 (×7): qty 1

## 2014-11-01 MED ORDER — DEXAMETHASONE SODIUM PHOSPHATE 4 MG/ML IJ SOLN
INTRAMUSCULAR | Status: DC | PRN
  Administered 2014-11-01: 10 mg via INTRAVENOUS

## 2014-11-01 MED ORDER — POTASSIUM CHLORIDE CRYS ER 20 MEQ PO TBCR: 20.0000 meq | EXTENDED_RELEASE_TABLET | Freq: Every day | ORAL | Status: DC | PRN

## 2014-11-01 MED ORDER — ENOXAPARIN SODIUM 40 MG/0.4ML ~~LOC~~ SOLN
40.0000 mg | SUBCUTANEOUS | Status: DC
  Administered 2014-11-02 – 2014-11-08 (×7): 40 mg via SUBCUTANEOUS
  Filled 2014-11-01 (×7): qty 0.4

## 2014-11-01 MED ORDER — INSULIN ASPART 100 UNIT/ML ~~LOC~~ SOLN
0.0000 [IU] | Freq: Every day | SUBCUTANEOUS | Status: DC
  Administered 2014-11-01: 2 [IU] via SUBCUTANEOUS
  Filled 2014-11-01: qty 2

## 2014-11-01 MED ORDER — MORPHINE SULFATE 2 MG/ML IJ SOLN: 2.0000 mg | INTRAMUSCULAR | Status: DC | PRN

## 2014-11-01 MED ORDER — FENTANYL CITRATE (PF) 100 MCG/2ML IJ SOLN: 25.0000 ug | INTRAMUSCULAR | Status: DC | PRN

## 2014-11-01 MED ORDER — FAMOTIDINE IN NACL 20-0.9 MG/50ML-% IV SOLN
20.0000 mg | Freq: Two times a day (BID) | INTRAVENOUS | Status: DC
Start: 2014-11-01 — End: 2014-11-05
  Administered 2014-11-01 – 2014-11-05 (×8): 20 mg via INTRAVENOUS
  Filled 2014-11-01 (×12): qty 50

## 2014-11-01 MED ORDER — FINASTERIDE 5 MG PO TABS
5.0000 mg | ORAL_TABLET | Freq: Every day | ORAL | Status: DC
  Administered 2014-11-01 – 2014-11-08 (×8): 5 mg via ORAL
  Filled 2014-11-01 (×8): qty 1

## 2014-11-01 MED ORDER — FAMOTIDINE 20 MG PO TABS
20.0000 mg | ORAL_TABLET | Freq: Once | ORAL | Status: AC
  Administered 2014-11-01: 20 mg via ORAL

## 2014-11-01 MED ORDER — MECLIZINE HCL 25 MG PO TABS
25.0000 mg | ORAL_TABLET | Freq: Three times a day (TID) | ORAL | Status: DC | PRN
Start: 2014-11-01 — End: 2014-11-08

## 2014-11-01 MED ORDER — CLOPIDOGREL BISULFATE 75 MG PO TABS
75.0000 mg | ORAL_TABLET | Freq: Every day | ORAL | Status: DC
  Administered 2014-11-02 – 2014-11-08 (×7): 75 mg via ORAL
  Filled 2014-11-01 (×7): qty 1

## 2014-11-01 MED ORDER — SODIUM CHLORIDE 0.9 % IV SOLN
INTRAVENOUS | Status: DC
  Administered 2014-11-01: 18:00:00 via INTRAVENOUS

## 2014-11-01 MED ORDER — GUAIFENESIN-DM 100-10 MG/5ML PO SYRP
15.0000 mL | ORAL_SOLUTION | ORAL | Status: DC | PRN
  Filled 2014-11-01: qty 15

## 2014-11-01 MED ORDER — ONDANSETRON HCL 4 MG/2ML IJ SOLN
4.0000 mg | Freq: Four times a day (QID) | INTRAMUSCULAR | Status: DC | PRN
Start: 2014-11-01 — End: 2014-11-08
  Administered 2014-11-01 – 2014-11-02 (×2): 4 mg via INTRAVENOUS
  Filled 2014-11-01 (×2): qty 2

## 2014-11-01 MED ORDER — ONDANSETRON HCL 4 MG/2ML IJ SOLN: 4.0000 mg | Freq: Once | INTRAMUSCULAR | Status: DC | PRN

## 2014-11-01 MED ORDER — VANCOMYCIN HCL IN DEXTROSE 1-5 GM/200ML-% IV SOLN
1000.0000 mg | Freq: Once | INTRAVENOUS | Status: AC
  Administered 2014-11-01: 1000 mg via INTRAVENOUS

## 2014-11-01 MED ORDER — FAMOTIDINE 20 MG PO TABS
ORAL_TABLET | ORAL | Status: AC
  Administered 2014-11-01: 20 mg via ORAL
  Filled 2014-11-01: qty 1

## 2014-11-01 MED ORDER — SORBITOL 70 % SOLN
30.0000 mL | Freq: Every day | Status: DC | PRN
  Filled 2014-11-01: qty 30

## 2014-11-01 MED ORDER — PHENOL 1.4 % MT LIQD
1.0000 | OROMUCOSAL | Status: DC | PRN
  Administered 2014-11-07: 1 via OROMUCOSAL
  Filled 2014-11-01: qty 177

## 2014-11-01 MED ORDER — ROCURONIUM BROMIDE 100 MG/10ML IV SOLN
INTRAVENOUS | Status: DC | PRN
  Administered 2014-11-01: 10 mg via INTRAVENOUS

## 2014-11-01 MED ORDER — SODIUM CHLORIDE 0.9 % IV SOLN: 500.0000 mL | Freq: Once | INTRAVENOUS | Status: DC | PRN

## 2014-11-01 MED ORDER — VANCOMYCIN HCL IN DEXTROSE 1-5 GM/200ML-% IV SOLN
1000.0000 mg | Freq: Once | INTRAVENOUS | Status: AC
  Administered 2014-11-01: 1000 mg via INTRAVENOUS
  Filled 2014-11-01: qty 200

## 2014-11-01 MED ORDER — HYDROCODONE-ACETAMINOPHEN 5-325 MG PO TABS
1.0000 | ORAL_TABLET | Freq: Four times a day (QID) | ORAL | Status: DC | PRN
  Administered 2014-11-03: 2 via ORAL
  Administered 2014-11-03 – 2014-11-04 (×2): 1 via ORAL
  Filled 2014-11-01 (×2): qty 1
  Filled 2014-11-01: qty 2

## 2014-11-01 MED ORDER — GABAPENTIN 100 MG PO CAPS
100.0000 mg | ORAL_CAPSULE | Freq: Three times a day (TID) | ORAL | Status: DC
  Administered 2014-11-01 – 2014-11-08 (×20): 100 mg via ORAL
  Filled 2014-11-01 (×20): qty 1

## 2014-11-01 MED ORDER — MAGNESIUM SULFATE 2 GM/50ML IV SOLN
2.0000 g | Freq: Every day | INTRAVENOUS | Status: DC | PRN
  Filled 2014-11-01: qty 50

## 2014-11-01 MED ORDER — SODIUM CHLORIDE 0.9 % IV SOLN
INTRAVENOUS | Status: DC
  Administered 2014-11-01 (×3): via INTRAVENOUS

## 2014-11-01 MED ORDER — HYDRALAZINE HCL 20 MG/ML IJ SOLN: 5.0000 mg | INTRAMUSCULAR | Status: DC | PRN

## 2014-11-01 MED ORDER — PROPOFOL 10 MG/ML IV BOLUS
INTRAVENOUS | Status: DC | PRN
  Administered 2014-11-01: 170 mg via INTRAVENOUS

## 2014-11-01 MED ORDER — BENAZEPRIL HCL 20 MG PO TABS
40.0000 mg | ORAL_TABLET | Freq: Every day | ORAL | Status: DC
  Administered 2014-11-02 – 2014-11-08 (×7): 40 mg via ORAL
  Filled 2014-11-01 (×7): qty 2

## 2014-11-01 MED ORDER — BACLOFEN 10 MG PO TABS
10.0000 mg | ORAL_TABLET | Freq: Three times a day (TID) | ORAL | Status: DC | PRN
  Filled 2014-11-01: qty 1

## 2014-11-01 MED ORDER — PHENYLEPHRINE HCL 10 MG/ML IJ SOLN
INTRAMUSCULAR | Status: DC | PRN
  Administered 2014-11-01: 100 ug via INTRAVENOUS

## 2014-11-01 MED ORDER — AMLODIPINE BESYLATE 5 MG PO TABS
5.0000 mg | ORAL_TABLET | Freq: Every day | ORAL | Status: DC
  Administered 2014-11-02 – 2014-11-08 (×7): 5 mg via ORAL
  Filled 2014-11-01 (×7): qty 1

## 2014-11-01 MED ORDER — NITROGLYCERIN IN D5W 200-5 MCG/ML-% IV SOLN: 5.0000 ug/min | INTRAVENOUS | Status: DC

## 2014-11-01 MED ORDER — METOPROLOL TARTRATE 1 MG/ML IV SOLN: 2.0000 mg | INTRAVENOUS | Status: DC | PRN

## 2014-11-01 MED ORDER — INSULIN ASPART 100 UNIT/ML ~~LOC~~ SOLN
0.0000 [IU] | Freq: Three times a day (TID) | SUBCUTANEOUS | Status: DC
  Administered 2014-11-02: 1 [IU] via SUBCUTANEOUS
  Administered 2014-11-02: 2 [IU] via SUBCUTANEOUS
  Filled 2014-11-01: qty 1
  Filled 2014-11-01: qty 2

## 2014-11-01 MED ORDER — METOPROLOL TARTRATE 50 MG PO TABS
50.0000 mg | ORAL_TABLET | Freq: Two times a day (BID) | ORAL | Status: DC
  Administered 2014-11-02 – 2014-11-08 (×12): 50 mg via ORAL
  Filled 2014-11-01 (×14): qty 1

## 2014-11-01 SURGICAL SUPPLY — 57 items
APPLIER CLIP 11 MED OPEN (CLIP)
APPLIER CLIP 9.375 SM OPEN (CLIP) ×2
BAG COUNTER SPONGE EZ (MISCELLANEOUS) IMPLANT
BAG DECANTER STRL (MISCELLANEOUS) ×2 IMPLANT
BLADE CLIPPER SURG (BLADE) ×2 IMPLANT
BLADE SURG 15 STRL LF DISP TIS (BLADE) ×1 IMPLANT
BLADE SURG 15 STRL SS (BLADE) ×1
BLADE SURG SZ11 CARB STEEL (BLADE) ×2 IMPLANT
BOOT SUTURE AID YELLOW STND (SUTURE) ×4 IMPLANT
BRUSH SCRUB 4% CHG (MISCELLANEOUS) ×2 IMPLANT
CANISTER SUCT 1200ML W/VALVE (MISCELLANEOUS) ×2 IMPLANT
CATH TRAY 16F METER LATEX (MISCELLANEOUS) ×2 IMPLANT
CLIP APPLIE 11 MED OPEN (CLIP) IMPLANT
CLIP APPLIE 9.375 SM OPEN (CLIP) ×1 IMPLANT
DRAPE INCISE IOBAN 66X45 STRL (DRAPES) ×2 IMPLANT
DRAPE LAPAROTOMY 100X77 ABD (DRAPES) IMPLANT
DRAPE UNIVERSAL PACK (DRAPES) ×2 IMPLANT
DURAPREP 26ML APPLICATOR (WOUND CARE) ×2 IMPLANT
ELECT CAUTERY BLADE 6.4 (BLADE) ×4 IMPLANT
EVICEL 5ML SEALNT HUMAN B (Miscellaneous) ×2 IMPLANT
GAUZE SPONGE 4X4 12PLY STRL (GAUZE/BANDAGES/DRESSINGS) IMPLANT
GLOVE BIO SURGEON STRL SZ7 (GLOVE) ×8 IMPLANT
GOWN STRL REUS W/ TWL LRG LVL3 (GOWN DISPOSABLE) ×1 IMPLANT
GOWN STRL REUS W/ TWL XL LVL3 (GOWN DISPOSABLE) ×2 IMPLANT
GOWN STRL REUS W/TWL LRG LVL3 (GOWN DISPOSABLE) ×1
GOWN STRL REUS W/TWL XL LVL3 (GOWN DISPOSABLE) ×2
HEMOSTAT SURGICEL 2X3 (HEMOSTASIS) ×2 IMPLANT
IV NS 500ML (IV SOLUTION) ×1
IV NS 500ML BAXH (IV SOLUTION) ×1 IMPLANT
KIT RM TURNOVER STRD PROC AR (KITS) ×2 IMPLANT
LABEL OR SOLS (LABEL) ×2 IMPLANT
LIQUID BAND (GAUZE/BANDAGES/DRESSINGS) IMPLANT
LOOP RED MAXI  1X406MM (MISCELLANEOUS) ×3
LOOP VESSEL MAXI 1X406 RED (MISCELLANEOUS) ×3 IMPLANT
LOOP VESSEL MINI 0.8X406 BLUE (MISCELLANEOUS) ×6 IMPLANT
LOOPS BLUE MINI 0.8X406MM (MISCELLANEOUS) ×6
NS IRRIG 500ML POUR BTL (IV SOLUTION) ×2 IMPLANT
PACK BASIN MAJOR ARMC (MISCELLANEOUS) ×2 IMPLANT
PAD GROUND ADULT SPLIT (MISCELLANEOUS) ×2 IMPLANT
PATCH CAROTID ECM VASC 1X10 (Prosthesis & Implant Heart) ×4 IMPLANT
PENCIL ELECTRO HAND CTR (MISCELLANEOUS) ×2 IMPLANT
SUT PROLENE 5 0 RB 1 DA (SUTURE) ×4 IMPLANT
SUT PROLENE 6 0 BV (SUTURE) ×22 IMPLANT
SUT PROLENE 7 0 BV 1 (SUTURE) ×4 IMPLANT
SUT SILK 2 0 (SUTURE) ×2
SUT SILK 2-0 18XBRD TIE 12 (SUTURE) ×2 IMPLANT
SUT SILK 3 0 (SUTURE) ×2
SUT SILK 3-0 18XBRD TIE 12 (SUTURE) ×2 IMPLANT
SUT SILK 4 0 (SUTURE) ×1
SUT SILK 4-0 18XBRD TIE 12 (SUTURE) ×1 IMPLANT
SUT VIC AB 2-0 CT1 27 (SUTURE) ×2
SUT VIC AB 2-0 CT1 TAPERPNT 27 (SUTURE) ×2 IMPLANT
SUT VIC AB 3-0 SH 27 (SUTURE)
SUT VIC AB 3-0 SH 27X BRD (SUTURE) IMPLANT
SUT VICRYL+ 3-0 36IN CT-1 (SUTURE) ×4 IMPLANT
SYR 20CC LL (SYRINGE) ×2 IMPLANT
SYR 5ML LL (SYRINGE) ×2 IMPLANT

## 2014-11-01 NOTE — Op Note (Signed)
OPERATIVE NOTE   PROCEDURE: 1. Left common femoral, profunda femoris, and superficial femoral artery endarterectomies with Cormatrix patch angioplasty 2.   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 Diabetes 3. Hypertension  POST-OPERATIVE DIAGNOSIS: Same  SURGEON: Rella Egelston, MD  CO-surgeon: Hortencia Pilar, MD  ANESTHESIA: general  ESTIMATED BLOOD LOSS: 300 cc  FINDING(S): 1. significant densely calcific plaque in bilateral common femoral, profunda femoris, and superficial femoral arteries  SPECIMEN(S): Bilateral common femoral, profunda femoris, and superficial femoral artery plaque.  INDICATIONS:  Patient presents with very short distance claudication in both lower extremities which has become disabling and completely lifestyle limiting. He has previously undergone in for inguinal work in years past with improvement in symptoms but now his disease has progressed to femoral artery occlusions bilaterall Leey.  Bilateral femoral endarterectomies are planned to try to improve perfusion. The risks and benefits as well as alternative therapies including intervention were reviewed in detail all questions were answered the patient agrees to proceed with surgery.  DESCRIPTION: After obtaining full informed written consent, the patient was brought back to the operating room and placed supine upon the operating table. The patient received IV antibiotics prior to induction. After obtaining adequate anesthesia, the patient was prepped and draped in the standard fashion appropriate time out is called.   With myself working on the right  and Dr. Delana Meyer  working on the left  we began by dissecting out the femoral arteries on each side. Vertical incisions were created overlying both femoral  arteries. The common femoral artery proximally, and superficial femoral artery, and primary profunda femoris artery branches were encircled with vessel loops and prepared for control.  on each side, the dissection was carried above the circumflex vessels to a softer part of the vessel to allow for clamping. The SFA was dissected out well beyond the origin to a softer area that would be our distal endpoints. With continuous flow in the SFA and distally, we plan to try to perform eversion endarterectomies on the profunda femoris artery and patch open the proximal superficial femoral artery. Both femoral arteries were found to have significant plaque from the common femoral artery into the profunda and superficial femoral arteries.The plaque was very calcific and extensive. The profunda femoris artery was dissected out to secondary branches that were soft for control.  5000  units of heparin was given and allowed circulate for 5 minutes.   Attention is then turned to the left  femoral artery. An arteriotomy is made with 11 blade and extended with Potts scissors in the common femoral artery and carried down onto the first 3-4 cm of the superficial femoral  artery. An endarterectomy was then performed. The [facility] was used to create a plane. The proximal endpoint was cut flush with tenotomy scissors. This was in the proximal common femoral artery Near the level of the circumflex vessels. An eversion endarterectomy was then performed for the first 2-3 cm of the profunda femoris artery with a nice distal endpoint created. Good backbleeding was then seen. The distal endpoint of the superficial femoral artery  endarterectomy was created with gentle traction and the distal endpoint was clean with good backbleeding from both the SFA and profunda. The Cormatrix patcth is then selected and prepared for a patch angioplasty.  It is cut and beveled and started at the proximal endpoint with a 6-0 Prolene suture.   Approximately  one half of the suture line is run medially and laterally and the distal end point was cut and bevelled to match the arteriotomy.  A second 6-0 Prolene was started at the distal end point and run to the mid portion to complete the arteriotomy.  The vessel was flushed prior to release of control and completion of the anastomosis.  At this point, flow was established first to the profunda femoris artery and then to the superficial femoral artery. Excellent doppler signals and pulses are noted well beyond the anastomosis and both arteries.  The right  femoral artery is then addressed.  the proximal common femoral artery is controlled with a vascular clamp and the other vessels are controlled with vessel loops until the superficial femoral artery is also controlled with a small vascular clamp. Arteriotomy is made in the common femoral artery and extended down into the superficial femoral  artery About 3 cm downstream.  the vessel was completely occluded, or seemed to be a component of newer thrombus as part of his dense calcific plaque.  Similarly, an endarterectomy was performed with the [facility]. The proximal endpoint was cut flush with tenotomy scissors in the proximal common femoral artery.  The profunda femoris artery  was addressed and treated with an eversion endarterectomy and this was performed with a hemostat and gentle traction After using the [place] elevator to dissected out the first few centimeters of the profunda femoris artery and perform an eversion endarterectomy. The arteriotomy was carried down onto the superficial femoral  artery and the endarterectomy was continued to this point. The distal endpoint was created with gentle traction pulling the plaque out of the superficial femoral artery about a centimeter beyond the arteriotomy with a nice feathered distal endpoint created. The Cormatrix extracellular patch was then brought onto the field.  It is cut and beveled and  started at the proximal endpoint with a 6-0 Prolene suture.  Approximately one half of the suture line is run medially and laterally and the distal end point was cut and bevelled to match the arteriotomy.  A second 6-0 Prolene was started at the distal end point and run to the mid portion to complete the arteriotomy.   Flushing maneuvers were performed and flow was reestablished to the femoral vessels. Excellent  Doppler signals and pulses noted in the superficial femoral and profunda femoris artery below the endarterectomy sites.  Several 6-0 Prolene patch sutures were used for hemostasis on the suture lines on each side. Hemostasis was achieved.  Surgicel and Evicel topical hemostatic agents were placed in the femoral incisions and hemostasis was complete. The femoral incisions were then closed in a layered fashion with 2 layers of 2-0 Vicryl, 2 layers of 3-0 Vicryl, and 4-0 Monocryl  for the skin closure. Dermabond and sterile dressing were then placed over all incisions.  The patient was then awakened from anesthesia and taken to the recovery room in stable condition having tolerated the procedure well.  COMPLICATIONS: None  CONDITION: Stable     Cleveland Paiz 11/01/2014 3:54 PM

## 2014-11-01 NOTE — Progress Notes (Signed)
RN got message from MD on call (Dr. Delana Meyer) through OR to hold patient's plavix tonight.

## 2014-11-01 NOTE — Anesthesia Procedure Notes (Signed)
Procedure Name: Intubation Date/Time: 11/01/2014 1:04 PM Performed by: Nelda Marseille Pre-anesthesia Checklist: Patient identified, Patient being monitored, Timeout performed, Emergency Drugs available and Suction available Patient Re-evaluated:Patient Re-evaluated prior to inductionOxygen Delivery Method: Circle system utilized Preoxygenation: Pre-oxygenation with 100% oxygen Intubation Type: IV induction Ventilation: Mask ventilation without difficulty Laryngoscope Size: Mac and 3 Grade View: Grade I Tube type: Oral Tube size: 7.5 mm Number of attempts: 1 Airway Equipment and Method: Stylet Placement Confirmation: ETT inserted through vocal cords under direct vision,  positive ETCO2 and breath sounds checked- equal and bilateral Secured at: 21 cm Tube secured with: Tape Dental Injury: Teeth and Oropharynx as per pre-operative assessment

## 2014-11-01 NOTE — Anesthesia Preprocedure Evaluation (Signed)
Anesthesia Evaluation  Patient identified by MRN, date of birth, ID band Patient awake    Reviewed: Allergy & Precautions, NPO status , Patient's Chart, lab work & pertinent test results, reviewed documented beta blocker date and time   Airway Mallampati: III  TM Distance: >3 FB     Dental  (+) Edentulous Upper, Edentulous Lower   Pulmonary sleep apnea , former smoker,          Cardiovascular hypertension,     Neuro/Psych    GI/Hepatic   Endo/Other  diabetes, Type 2  Renal/GU      Musculoskeletal   Abdominal   Peds  Hematology   Anesthesia Other Findings Maybe difficult to intubate. Has a beard. He will use CPAP.  Reproductive/Obstetrics                             Anesthesia Physical Anesthesia Plan  ASA: III  Anesthesia Plan: General   Post-op Pain Management:    Induction: Intravenous  Airway Management Planned: Oral ETT  Additional Equipment:   Intra-op Plan:   Post-operative Plan:   Informed Consent: I have reviewed the patients History and Physical, chart, labs and discussed the procedure including the risks, benefits and alternatives for the proposed anesthesia with the patient or authorized representative who has indicated his/her understanding and acceptance.     Plan Discussed with: CRNA  Anesthesia Plan Comments:         Anesthesia Quick Evaluation

## 2014-11-01 NOTE — Transfer of Care (Signed)
Immediate Anesthesia Transfer of Care Note  Patient: Randy Howard  Procedure(s) Performed: Procedure(s): ENDARTERECTOMY FEMORAL (Bilateral)  Patient Location: PACU  Anesthesia Type:General  Level of Consciousness: awake and sedated  Airway & Oxygen Therapy: Patient Spontanous Breathing and Patient connected to face mask oxygen  Post-op Assessment: Report given to RN and Post -op Vital signs reviewed and stable  Post vital signs: Reviewed and stable  Last Vitals:  Filed Vitals:   11/01/14 1556  BP: 143/85  Pulse: 74  Temp: 37.2 C  Resp: 18    Complications: No apparent anesthesia complications

## 2014-11-01 NOTE — Consult Note (Signed)
Medical Consultation  Randy Howard YVO:[id number] DOB: [birth year] DOA: 11/01/2014 PCP: Dicky Doe, MD   Requesting physician: Dr Lucky Cowboy Date of consultation: 11/01/2014  Reason for consultation: medical management  Impression/Recommendations  1. Type 2 diabetes: Patient will be placed on sliding scale insulin. Continue Amaryl. Hold metformin. Blood sugars will be monitored closely.  2. Essential hypertension: Patient's blood pressure is acceptable at this point. Continue metoprolol, Norvasc and Benzapril.  3.  BPH: Continue finasteride and Flomax  4. CAD: Continue aspirin, Plavix, metoprolol. According to the patient his lovastatin has been on hold as per his cardiologist Dr.Callwood.  5. Peripheral vascular disease: s/p Left common femoral, profunda femoris, and superficial femoral artery endarterectomies with Cormatrix patch angioplasty and Right common femoral, profunda femoris, and superficial femoral artery endarterectomies with Cormatrix patch angioplasty. Management as per vascular  Chief Complaint: Patient is here with severe claudication  HPI:  This a very pleasant 74 year old male with history of CAD, peripheral vascular disease and diabetes who presented for symptomatic bilateral occlusive disease. He underwent angioplasty. Hospital service has been consulted for medical management.  Review of Systems  Constitutional: Negative for fever, chills weight loss HENT: Negative for ear pain, nosebleeds, congestion, facial swelling, rhinorrhea, neck pain, neck stiffness and ear discharge.   Respiratory: Negative for cough, shortness of breath, wheezing  Cardiovascular: Negative for chest pain, palpitations and leg swelling.  Gastrointestinal: Negative for heartburn, abdominal pain, vomiting, diarrhea or consitpation Genitourinary: Negative for dysuria, urgency, frequency, hematuria Musculoskeletal: Negative for back pain or joint pain Neurological: Negative for dizziness,  seizures, syncope, focal weakness,  numbness and headaches.  Hematological: Does not bruise/bleed easily.  Psychiatric/Behavioral: Negative for hallucinations, confusion, dysphoric mood   Past Medical History  Diagnosis Date  . Diabetes type 2, controlled   . Nocturia   . Elevated lipids   . Hypertension   . Prostate enlargement   . Peripheral vascular disease   . Obstructive sleep apnea   . Skin cancer     Followed by Dr. Evorn Gong  . Neuropathy   . Right carotid bruit   . Tremor   . Coronary artery disease   . Skin cancer   . PVD (peripheral vascular disease)    Past Surgical History  Procedure Laterality Date  . Coronary artery bypass graft  2006  . Carotid artery angioplasty Left 09/30/2011  . Cardiac catheterization    . Peripheral vascular catheterization Left 10/09/2014    Procedure: Lower Extremity Angiography;  Surgeon: Algernon Huxley, MD;  Location: [place];  Service: Cardiovascular;  Laterality: Left;  . Peripheral vascular catheterization  10/09/2014    Procedure: Lower Extremity Intervention;  Surgeon: Algernon Huxley, MD;  Location: [place] [facility];  Service: Cardiovascular;;   Social History:  reports that he quit smoking about 36 years ago. He does not have any smokeless tobacco history on file. He reports that he does not drink alcohol or use illicit drugs.  Allergies  Allergen Reactions  . Oxycodone Nausea Only   Family History  Problem Relation Age of Onset  . Heart disease Sister   . Heart disease Sister   . Diabetes Sister   . Diabetes Sister   . Diabetes Brother     Prior to Admission medications   Medication Sig Start Date End Date Taking? Authorizing Provider  amLODipine (NORVASC) 5 MG tablet Take 5 mg by mouth daily.   Yes Historical Provider, MD  aspirin 81 MG tablet Take 81 mg by mouth daily.  Yes Historical Provider, MD  baclofen (LIORESAL) 10 MG tablet Take 10 mg by mouth 3 (three) times daily as needed for muscle spasms.   Yes  Historical Provider, MD  benazepril (LOTENSIN) 40 MG tablet Take 40 mg by mouth daily.   Yes Historical Provider, MD  finasteride (PROSCAR) 5 MG tablet Take 5 mg by mouth daily.   Yes Historical Provider, MD  furosemide (LASIX) 20 MG tablet Take 1 tablet by mouth daily. 09/07/14 09/07/15 Yes Historical Provider, MD  gabapentin (NEURONTIN) 100 MG capsule Take 100 mg by mouth 3 (three) times daily.   Yes Historical Provider, MD  glimepiride (AMARYL) 4 MG tablet Take 2 mg by mouth daily with breakfast.   Yes Historical Provider, MD  meclizine (ANTIVERT) 25 MG tablet Take 1 tablet (25 mg total) by mouth 3 (three) times daily as needed for dizziness. 10/06/14  Yes Arlis Porta., MD  metoprolol (LOPRESSOR) 50 MG tablet Take 50 mg by mouth 2 (two) times daily.   Yes Historical Provider, MD  tamsulosin (FLOMAX) 0.4 MG CAPS capsule Take 1 capsule by mouth daily.   Yes Historical Provider, MD  clopidogrel (PLAVIX) 75 MG tablet Take 75 mg by mouth daily.    Historical Provider, MD  etodolac (LODINE) 300 MG capsule Take 300 mg by mouth 2 (two) times daily as needed.    Historical Provider, MD  metFORMIN (GLUCOPHAGE) 1000 MG tablet Take 1,000 mg by mouth 2 (two) times daily with a meal.    Historical Provider, MD    Physical Exam: Blood pressure 128/53, pulse 57, temperature 98.2 F (36.8 C), temperature source Oral, resp. rate 16, SpO2 96 %. @VITALS2 @ There were no vitals filed for this visit.  Intake/Output Summary (Last 24 hours) at 11/01/14 1742 Last data filed at 11/01/14 1651  Gross per 24 hour  Intake   2200 ml  Output    800 ml  Net   1400 ml     Constitutional: Appears well-developed and well-nourished. No distress. HENT: Normocephalic. Marland Kitchen Oropharynx is clear and moist.  Eyes: Conjunctivae and EOM are normal. PERRLA, no scleral icterus.  Neck: Normal ROM. Neck supple. No JVD. No tracheal deviation. CVS: RRR, S1/S2 +, no murmurs, no gallops, no carotid bruit.  Pulmonary: Effort and  breath sounds normal, no stridor, rhonchi, wheezes, rales.  Abdominal: Soft. BS +,  no distension, tenderness, rebound or guarding.  Musculoskeletal: Normal range of motion. No edema and no tenderness.  Neuro: Alert. CN 2-12 grossly intact. No focal deficits. Skin: Skin is warm and dry. No rash noted. Psychiatric: Normal mood and affect.  Patient has a good pulse right lower extremity have asked the nurses check left lower extremity Doppler  Labs  Basic Metabolic Panel: No results for input(s): NA, K, CL, CO2, GLUCOSE, BUN, CREATININE, CALCIUM, MG, PHOS in the last 168 hours. Liver Function Tests: No results for input(s): AST, ALT, ALKPHOS, BILITOT, PROT, ALBUMIN in the last 168 hours. No results for input(s): LIPASE, AMYLASE in the last 168 hours.  CBC: No results for input(s): WBC, NEUTROABS, HGB, HCT, MCV, PLT in the last 168 hours. Cardiac Enzymes: No results for input(s): CKTOTAL, CKMB, CKMBINDEX, TROPONINI in the last 168 hours. BNP: Invalid input(s): POCBNP CBG:  Recent Labs Lab 11/01/14 1604  GLUCAP 143*    Radiological Exams: No results found.     Thank you for allowing me to participate in the care of your patient. We will continue to follow.   Time spent: 50  Faysal Fenoglio,  Ulice Bold, MD

## 2014-11-01 NOTE — Progress Notes (Signed)
RN left message with OR RN for MD to call back about plavix clarification

## 2014-11-01 NOTE — H&P (Signed)
[facility] Admission History & Physical  MRN : [id number]  Randy Howard is a 74 y.o. (1940/10/08) male who presents with chief complaint of No chief complaint on file. Marland Kitchen  History of Present Illness: Patient with extensive BLE PAD.  Has claudication at only a few feet and pain worrisome for early ischemic rest pain.  Has had infrainguinal revascularization in years past and these are doing well.  Severe femoral disease on angiogram not amenable to percutaneous therapy.  BLE affected and this has progressed over months to years.  He desires treatment to improve his symptoms.    Current Facility-Administered Medications  Medication Dose Route Frequency Provider Last Rate Last Dose  . 0.9 %  sodium chloride infusion   Intravenous Continuous Andria Frames, MD 75 mL/hr at 11/01/14 1100    . 0.9 %  sodium chloride infusion   Intravenous Continuous Gunnar Bulla, MD 50 mL/hr at 11/01/14 1108    . ceFAZolin (ANCEF) 1-5 GM-% IVPB           . ceFAZolin (ANCEF) IVPB 1 g/50 mL premix  1 g Intravenous Once Algernon Huxley, MD      . vancomycin (VANCOCIN) IVPB 1000 mg/200 mL premix  1,000 mg Intravenous Once Katha Cabal, MD   1,000 mg at 11/01/14 1058    Past Medical History  Diagnosis Date  . Diabetes type 2, controlled   . Nocturia   . Elevated lipids   . Hypertension   . Prostate enlargement   . Peripheral vascular disease   . Obstructive sleep apnea   . Skin cancer     Followed by Dr. Evorn Gong  . Neuropathy   . Right carotid bruit   . Tremor   . Coronary artery disease   . Skin cancer   . PVD (peripheral vascular disease)     Past Surgical History  Procedure Laterality Date  . Coronary artery bypass graft  2006  . Carotid artery angioplasty Left 09/30/2011  . Cardiac catheterization    . Peripheral vascular catheterization Left 10/09/2014    Procedure: Lower Extremity Angiography;  Surgeon: Algernon Huxley, MD;  Location: [facility];  Service:  Cardiovascular;  Laterality: Left;  . Peripheral vascular catheterization  10/09/2014    Procedure: Lower Extremity Intervention;  Surgeon: Algernon Huxley, MD;  Location: [facility];  Service: Cardiovascular;;    Social History Social History  Substance Use Topics  . Smoking status: Former Smoker    Quit date: 03/24/1978  . Smokeless tobacco: None  . Alcohol Use: No  No IV drug use, lives alone  Family History Family History  Problem Relation Age of Onset  . Heart disease Sister   . Heart disease Sister   . Diabetes Sister   . Diabetes Sister   . Diabetes Brother   no bleeding or clotting disorders  Allergies  Allergen Reactions  . Oxycodone Nausea Only     REVIEW OF SYSTEMS (Negative unless checked)  Constitutional: [] Weight loss  [] Fever  [] Chills Cardiac: [] Chest pain   [] Chest pressure   [] Palpitations   [] Shortness of breath when laying flat   [] Shortness of breath at rest   [] Shortness of breath with exertion. Vascular:  [x] Pain in legs with walking   [] Pain in legs at rest   [] Pain in legs when laying flat   [x] Claudication   [x] Pain in feet when walking  [] Pain in feet at rest  [] Pain in feet when laying flat   []   History of DVT   [] Phlebitis   [] Swelling in legs   [] Varicose veins   [] Non-healing ulcers Pulmonary:   [] Uses home oxygen   [] Productive cough   [] Hemoptysis   [] Wheeze  [] COPD   [] Asthma Neurologic:  [] Dizziness  [] Blackouts   [] Seizures   [] History of stroke   [] History of TIA  [] Aphasia   [] Temporary blindness   [] Dysphagia   [] Weakness or numbness in arms   [] Weakness or numbness in legs Musculoskeletal:  [] Arthritis   [] Joint swelling   [] Joint pain   [] Low back pain Hematologic:  [] Easy bruising  [] Easy bleeding   [] Hypercoagulable state   [] Anemic  [] Hepatitis Gastrointestinal:  [] Blood in stool   [] Vomiting blood  [] Gastroesophageal reflux/heartburn   [] Difficulty swallowing. Genitourinary:  [] Chronic kidney disease   [] Difficult urination   [] Frequent urination  [] Burning with urination   [] Blood in urine Skin:  [] Rashes   [] Ulcers   [] Wounds Psychological:  [] History of anxiety   []  History of major depression.  Physical Examination  Filed Vitals:   11/01/14 1027  BP: 116/66  Temp: 98.4 F (36.9 C)  TempSrc: Oral  Resp: 16  SpO2: 100%   There is no weight on file to calculate BMI. Gen: WD/WN, NAD Head: [place]/AT, No temporalis wasting. Prominent temp pulse not noted. Ear/Nose/Throat: Hearing grossly intact, nares w/o erythema or drainage, oropharynx w/o Erythema/Exudate,  Eyes: PERRLA, EOMI.  Neck: Supple, no nuchal rigidity.  No bruit or JVD.  Pulmonary:  Good air movement, clear to auscultation bilaterally, no use of accessory muscles.  Cardiac: RRR, normal S1, S2, no Murmurs, rubs or gallops. Vascular:  Vessel Right Left  Radial Palpable Palpable  Ulnar Palpable Palpable  Brachial Palpable Palpable  Carotid Palpable, without bruit Palpable, without bruit  Aorta Not palpable N/A  Femoral Not Palpable Not Palpable  Popliteal Not Palpable Not Palpable  PT Not Palpable Not Palpable  DP Not Palpable Not Palpable   Gastrointestinal: soft, non-tender/non-distended. No guarding/reflex.  Musculoskeletal: M/S 5/5 throughout.  Extremities without ischemic changes.  No deformity or atrophy.  Neurologic: CN 2-12 intact. Pain and light touch intact in extremities.  Symmetrical.  Speech is fluent. Motor exam as listed above. Psychiatric: Judgment intact, Mood & affect appropriate for pt's clinical situation. Dermatologic: No rashes or ulcers noted.  No cellulitis or open wounds. Lymph : No Cervical, Axillary, or Inguinal lymphadenopathy.      CBC Lab Results  Component Value Date   WBC 7.3 10/24/2014   HGB 13.4 10/24/2014   HCT 40.0 10/24/2014   MCV 91.2 10/24/2014   PLT 139* 10/24/2014    BMET    Component Value Date/Time   NA 140 10/24/2014 0958   NA 142 07/30/2013   NA 141 01/05/2012 0724   K 4.0  10/24/2014 0958   K 3.8 01/05/2012 0724   CL 103 10/24/2014 0958   CL 106 01/05/2012 0724   CO2 30 10/24/2014 0958   CO2 28 01/05/2012 0724   GLUCOSE 223* 10/24/2014 0958   GLUCOSE 152* 01/05/2012 0724   BUN 26* 10/24/2014 0958   BUN 15 07/30/2013   BUN 24* 01/05/2012 0724   CREATININE 1.02 10/24/2014 0958   CREATININE 1.2 07/30/2013   CREATININE 1.19 01/05/2012 0724   CALCIUM 9.7 10/24/2014 0958   CALCIUM 9.0 01/05/2012 0724   GFRNONAA >60 10/24/2014 0958   GFRNONAA >60 01/05/2012 0724   GFRAA >60 10/24/2014 0958   GFRAA >60 01/05/2012 0724   Estimated Creatinine Clearance: 66.6 mL/min (by C-G formula  based on Cr of 1.02).  COAG Lab Results  Component Value Date   INR 1.21 10/24/2014     Assessment/Plan 1. PAD with claudication BLE: severe common femoral disease and disease into proximal SFA and PFA  not amenable to percutaneous therapy.  Plan bilateral femoral endarterectomies today 2. CAD: stable 3. Diabetes:  Some neuropathy in LE also present and diabetes causitive factor to PAD 4. HTN: stable   Hattie Pine, MD  11/01/2014 11:45 AM

## 2014-11-01 NOTE — Op Note (Signed)
OPERATIVE NOTE   PROCEDURE: 1. bilateral iliofemoral endarterectomy with Cormatrix patch angioplasty  PRE-OPERATIVE DIAGNOSIS: Atherosclerotic occlusive disease bilateral lower extremities with lifestyle limiting claudication and mild rest pain symptoms; hypertension; diabetes mellitus  POST-OPERATIVE DIAGNOSIS: Same  CO-SURGEON: Katha Cabal, MD and Algernon Huxley, M.D.  ASSISTANT(S): None  ANESTHESIA: general  ESTIMATED BLOOD LOSS: 300 cc  FINDING(S): 1. Profound calcific plaque noted bilaterally extending past the initial bifurcation of the profunda femoris arteries as well as down the extensive length of the SFA  SPECIMEN(S):  Calcific plaque from the common femoral, superficial femoral and the profunda femoris arteries bilaterally  INDICATIONS:   Randy Howard is a 74 y.o. male who presents with complaints of lifestyle limiting claudication and pain continuously in his feet. He is known to have severe atherosclerotic occlusive disease and has undergone multiple minimally invasive treatments in the past. However, at this point his primary area of stricture stenosis resides in the common femoral and origins of the superficial femoral and profunda femoris extending into these arteries and therefore this is not amenable to intervention and he is now undergoing open endarterectomy. The risks and benefits of been reviewed with the patient QUESTIONS of been answered alternative therapies have been reviewed as well and he is agreed to proceed with surgical open repair.  DESCRIPTION: After obtaining full informed written consent, the patient was brought back to the operating room and placed supine upon the operating table.  The patient received IV antibiotics prior to induction.  After obtaining adequate anesthesia, the patient was prepped and draped in the standard fashion for: bilateral femoral exposure.  Attention was turned to the bilateral groin with Dr. Lucky Cowboy working on the  right and myself working on the left of the patient.  Vertical  incisions were made over the common femoral artery and dissected down to the common femoral artery with electrocautery.  I dissected out the common femoral artery from the distal external iliac artery (identified by the superficial circumflex vessels) down to the femoral bifurcation.  On initial inspection, the common femoral artery was: Densely calcified there was no palpable pulse noted bilaterally.    Subsequently the dissection was continued to include all circumflex branches and the profunda femoral artery and superficial femoral artery. The superficial femoral artery was dissected circumferentially for a distance of approximately 3-4 cm and the profunda femoris was dissected circumferentially out to the fourth order branches individual vessel loops were placed around each branch. Both of the groins were treated simultaneously as noted above. Control of all branches was obtained with vessel loops.  A softer area in the distal external iliac artery amendable to clamping was identified.    The patient was given 5000 units of Heparin intravenously, which was a therapeutic bolus.   After waiting 3 minutes, the distal external iliac artery was clamped and placed all circumflex branches, and the profunda and superficial femoral arteries under tension.  Arteriotomy was made in the common femoral artery with a 11-blade and extended it with a Potts scissor proximally and distally extending the distal end down the SFA for approximately 3 cm.   Endarterectomy was then performed under direct visualization using a Soil scientist and a right angle from the mid common femoral extending up both proximally and distally. Proximally the endarterectomy was brought up to the level of the clamp where a clean edge was obtained. Distally the endarterectomy was carried down to a soft spot where a feathered edge would was obtained.  The profunda femoris was treated  with an eversion technique extending endarterectomy approximately 2 cm distally again obtaining a featheredge on both sides right and left.   At this point, I fashioned a core matrix patch for the geometry of the arteriotomy.  The patch was sewn to the artery with 2 running stitches of 6-0 Prolene, running from each end.  Prior to completing the patch angioplasty, the profunda femoral artery was flushed as was the superficial femoral artery. The system was then forward flushed. The endarterectomy site was then irrigated copiously with heparinized saline. The patch angioplasty was completed in the usual fashion.  Flow was then reestablished first to the profunda femoris and then the superficial femoral artery. Any gaps or bleeding sites in the suture line were easily controlled with a 6-0 Prolene suture. Doppler is then delivered onto the field and the SFA as well as the profunda femoris arteries were interrogated and found to have triphasic Doppler signals.  Both right and left groins were then irrigated copiously with sterile saline and subsequently Evicel and Surgicel were placed in the wound. The incision was repaired with a double layer of 2-0 Vicryl, a double layer of 3-0 Vicryl, and a layer of 4-0 Monocryl in a subcuticular fashion.  The skin was cleaned, dried, and reinforced with Dermabond.  COMPLICATIONS: None  CONDITION: Randy Howard, M.D. [facility] Office: [phone]  11/01/2014, 9:34 PM

## 2014-11-01 NOTE — Anesthesia Postprocedure Evaluation (Signed)
  Anesthesia Post-op Note  Patient: Randy Howard  Procedure(s) Performed: Procedure(s): ENDARTERECTOMY FEMORAL (Bilateral)  Anesthesia type:General  Patient location: PACU  Post pain: Pain level controlled  Post assessment: Post-op Vital signs reviewed, Patient's Cardiovascular Status Stable, Respiratory Function Stable, Patent Airway and No signs of Nausea or vomiting  Post vital signs: Reviewed and stable  Last Vitals:  Filed Vitals:   11/01/14 1641  BP: 128/53  Pulse: 57  Temp: 36.8 C  Resp: 16    Level of consciousness: awake, alert  and patient cooperative  Complications: No apparent anesthesia complications

## 2014-11-01 NOTE — Progress Notes (Addendum)
Paged MD Dr. Lucky Cowboy to clarify if MD wants Plavix given tonight. Patient had recent surgery and will get daily dose tomorrow again but hasn't had for several days.

## 2014-11-02 ENCOUNTER — Encounter: Payer: Self-pay | Admitting: Vascular Surgery

## 2014-11-02 LAB — CBC
HEMATOCRIT: 34.8 % — AB (ref 40.0–52.0)
Hemoglobin: 11.5 g/dL — ABNORMAL LOW (ref 13.0–18.0)
MCH: 30.7 pg (ref 26.0–34.0)
MCHC: 33 g/dL (ref 32.0–36.0)
MCV: 93.1 fL (ref 80.0–100.0)
Platelets: 121 10*3/uL — ABNORMAL LOW (ref 150–440)
RBC: 3.74 MIL/uL — AB (ref 4.40–5.90)
RDW: 13.4 % (ref 11.5–14.5)
WBC: 11.9 10*3/uL — AB (ref 3.8–10.6)

## 2014-11-02 LAB — BASIC METABOLIC PANEL
Anion gap: 10 (ref 5–15)
BUN: 19 mg/dL (ref 6–20)
CO2: 21 mmol/L — AB (ref 22–32)
Calcium: 8.2 mg/dL — ABNORMAL LOW (ref 8.9–10.3)
Chloride: 107 mmol/L (ref 101–111)
Creatinine, Ser: 1.16 mg/dL (ref 0.61–1.24)
GFR calc Af Amer: >60 mL/min (ref >60)
GFR, EST NON AFRICAN AMERICAN: 60 mL/min — AB (ref >60)
Glucose, Bld: 231 mg/dL — ABNORMAL HIGH (ref 65–99)
POTASSIUM: 4.4 mmol/L (ref 3.5–5.1)
SODIUM: 138 mmol/L (ref 135–145)

## 2014-11-02 LAB — GLUCOSE, CAPILLARY
GLUCOSE-CAPILLARY: 140 mg/dL — AB (ref 65–99)
GLUCOSE-CAPILLARY: 200 mg/dL — AB (ref 65–99)
Glucose-Capillary: 194 mg/dL — ABNORMAL HIGH (ref 65–99)
Glucose-Capillary: 159 mg/dL — ABNORMAL HIGH (ref 65–99)
Glucose-Capillary: 168 mg/dL — ABNORMAL HIGH (ref 65–99)

## 2014-11-02 MED ORDER — INSULIN ASPART 100 UNIT/ML ~~LOC~~ SOLN: 0.0000 [IU] | Freq: Three times a day (TID) | SUBCUTANEOUS | Status: DC

## 2014-11-02 MED ORDER — DILTIAZEM HCL 60 MG PO TABS: 60.0000 mg | ORAL_TABLET | Freq: Three times a day (TID) | ORAL | Status: DC

## 2014-11-02 MED ORDER — INSULIN ASPART 100 UNIT/ML ~~LOC~~ SOLN: 0.0000 [IU] | Freq: Every day | SUBCUTANEOUS | Status: DC

## 2014-11-02 MED ORDER — INSULIN ASPART 100 UNIT/ML ~~LOC~~ SOLN
0.0000 [IU] | Freq: Three times a day (TID) | SUBCUTANEOUS | Status: DC
  Administered 2014-11-02: 2 [IU] via SUBCUTANEOUS
  Administered 2014-11-03 (×2): 1 [IU] via SUBCUTANEOUS
  Administered 2014-11-03: 2 [IU] via SUBCUTANEOUS
  Administered 2014-11-04: 3 [IU] via SUBCUTANEOUS
  Administered 2014-11-04: 2 [IU] via SUBCUTANEOUS
  Administered 2014-11-04: 1 [IU] via SUBCUTANEOUS
  Administered 2014-11-05: 2 [IU] via SUBCUTANEOUS
  Administered 2014-11-05: 1 [IU] via SUBCUTANEOUS
  Administered 2014-11-05: 2 [IU] via SUBCUTANEOUS
  Administered 2014-11-06: 1 [IU] via SUBCUTANEOUS
  Administered 2014-11-06: 2 [IU] via SUBCUTANEOUS
  Administered 2014-11-06: 3 [IU] via SUBCUTANEOUS
  Administered 2014-11-07: 1 [IU] via SUBCUTANEOUS
  Administered 2014-11-07 – 2014-11-08 (×3): 2 [IU] via SUBCUTANEOUS
  Filled 2014-11-02 (×3): qty 2
  Filled 2014-11-02: qty 3
  Filled 2014-11-02: qty 2
  Filled 2014-11-02: qty 1
  Filled 2014-11-02: qty 2
  Filled 2014-11-02 (×2): qty 1
  Filled 2014-11-02 (×3): qty 2
  Filled 2014-11-02: qty 1
  Filled 2014-11-02 (×2): qty 2
  Filled 2014-11-02: qty 3
  Filled 2014-11-02: qty 1
  Filled 2014-11-02: qty 2

## 2014-11-02 MED ORDER — INSULIN GLARGINE 100 UNIT/ML ~~LOC~~ SOLN
12.0000 [IU] | Freq: Every day | SUBCUTANEOUS | Status: DC
  Filled 2014-11-02 (×2): qty 0.12

## 2014-11-02 NOTE — Care Management Note (Signed)
Case Management Note  Patient Details  Name: LUCY WOOLEVER MRN: [id number] Date of Birth: 1940-05-17  Subjective/Objective:                  Met with patient to discuss discharge planning. His PCP is Dr. Philbert Riser. He uses [employer] and cab for transportation. He uses [facility] for Rx and they deliver to his home. He lives alone and having problems with vertigo. He has canes but no other DME at home.   Action/Plan: RNCM will continue to follow.   Expected Discharge Date:                  Expected Discharge Plan:     In-House Referral:     Discharge planning Services  CM Consult  Post Acute Care Choice:  Home Health, Durable Medical Equipment Choice offered to:  Patient  DME Arranged:    DME Agency:     HH Arranged:    [place] Agency:     Status of Service:  In process, will continue to follow  Medicare Important Message Given:    Date Medicare IM Given:    Medicare IM give by:    Date Additional Medicare IM Given:    Additional Medicare Important Message give by:     If discussed at [place] of Stay Meetings, dates discussed:    Additional Comments:  Marshell Garfinkel, RN 11/02/2014, 1:28 PM

## 2014-11-02 NOTE — Evaluation (Signed)
Physical Therapy Evaluation Patient Details Name: Randy Howard MRN: [id number] DOB: [birth year] Today's Date: 11/02/2014   History of Present Illness  Pt admitted to hospital with BLE PAD and was diagnosed with severe common femoral disease.  Pt had bilat femoral endarterectomies procedure to address the diagnosis.     Clinical Impression  Pt complied with therapy session but noted 10/10 pain with movement around his new hip flexor incisions (3/10 pain with resting). Pt was able to complete supine to sit transfer with min assist, but reported signs of dizziness when he sat up, pt vommitted after 2 min of sitting. Pt BP didn't significantly increase/decrease from supine to sitting (see vitals), pt reports hx of episodes of vertigo. Pt presents with unsteadiness/instability in seated/standing position and dizziness/fatigue is limiting him from ambulating for longer distances.  PT notified RN and RN provided pt with meds to address his nausea. After this, pt was able to stand up for ~1 min and transfer to his recliner chair with min assist using a RW (~59ft).  Pt would benefit from skilled PT in order to increase gross LE strength, improve with transfers, and ambulate for further distances.        Follow Up Recommendations SNF    Equipment Recommendations       Recommendations for Other Services       Precautions / Restrictions Precautions Precautions: Fall Restrictions Weight Bearing Restrictions: No      Mobility  Bed Mobility Overal bed mobility: Needs Assistance Bed Mobility: Supine to Sit     Supine to sit: Min assist     General bed mobility comments: Pt was able to sit up, although it was difficult for him, but required min assist to scoot his body to the EOB.    Transfers Overall transfer level: Needs assistance Equipment used: Rolling walker (2 wheeled) Transfers: Sit to/from Stand Sit to Stand: Min assist         General transfer comment: Pt was able to stand  with min assit using a RW, pt reports that he requires trunk flexion in standing position secondary to pain in incision.   Ambulation/Gait Ambulation/Gait assistance: Min assist Ambulation Distance (Feet): 5 Feet Assistive device: Rolling walker (2 wheeled) Gait Pattern/deviations: Step-to pattern;Decreased step length - right;Decreased step length - left;Decreased stride length;Trunk flexed   Gait velocity interpretation: <1.8 ft/sec, indicative of risk for recurrent falls    Stairs            Wheelchair Mobility    Modified Rankin (Stroke Patients Only)       Balance Overall balance assessment: Needs assistance Sitting-balance support: No upper extremity supported;Single extremity supported;Feet supported Sitting balance-Leahy Scale: Fair Sitting balance - Comments: Pt was able to sit for at least 2 min as seen in session, pt was limited by reported dizziness with sitting up.  Pt vommitted after 2 min of sitting, he reported that he has had previous episodes of vertigo prior to hospital admission.    Standing balance support: Bilateral upper extremity supported Standing balance-Leahy Scale: Poor Standing balance comment: Pt was able to stand for ~1 min to transfer to the recliner chair, pt remained in trunk flexion to avoid pain from hip flexor incision from his recent surgery.                              Pertinent Vitals/Pain Pain Assessment: 0-10 Pain Score: 10-Worst pain ever (3/10 pain resting, 10/10 with  WB) Pain Descriptors / Indicators: Sore;Spasm;Tender;Discomfort Pain Intervention(s): Limited activity within patient's tolerance;Monitored during session    Home Living Family/patient expects to be discharged to:: Private residence Living Arrangements: Alone   Type of Home: House Home Access: Stairs to enter Entrance Stairs-Rails: Right Entrance Stairs-Number of Steps: 4 Home Layout: One level Home Equipment: None      Prior Function Level of  Independence: Independent         Comments: Pt reports he was able to work in his garden prior to hospital visit.      Hand Dominance        Extremity/Trunk Assessment   Upper Extremity Assessment: Overall WFL for tasks assessed           Lower Extremity Assessment: Overall WFL for tasks assessed (Pt demonstrates at least 4+/5 in bilat LE with exception to R ankle DF (3+/5) and hip flexion not assessed secondary to pain. )         Communication   Communication: No difficulties  Cognition Arousal/Alertness: Awake/alert Behavior During Therapy: WFL for tasks assessed/performed Overall Cognitive Status: Within Functional Limits for tasks assessed                      General Comments      Exercises Other Exercises Other Exercises: Supine bilat ankle pumps/heel slides, 1 x 15.  Seated at EOB bilat LAQs, 1 x 10. (23min)      Assessment/Plan    PT Assessment Patient needs continued PT services  PT Diagnosis Difficulty walking   PT Problem List Decreased strength;Decreased range of motion;Decreased activity tolerance;Decreased balance;Decreased mobility;Decreased coordination;Decreased cognition;Decreased knowledge of use of DME;Decreased safety awareness  PT Treatment Interventions DME instruction;Gait training;Stair training;Functional mobility training;Therapeutic activities;Therapeutic exercise;Balance training;Cognitive remediation   PT Goals (Current goals can be found in the Care Plan section) Acute Rehab PT Goals Patient Stated Goal: to return home PT Goal Formulation: With patient Time For Goal Achievement: 11/16/14 Potential to Achieve Goals: Good    Frequency Min 2X/week   Barriers to discharge Inaccessible home environment 4 steps    Co-evaluation               End of Session Equipment Utilized During Treatment: Gait belt Activity Tolerance: Patient tolerated treatment well;Patient limited by fatigue;Patient limited by pain Patient  left: in chair;with call bell/phone within reach;with chair alarm set Nurse Communication: Mobility status;Patient requests pain meds (Notified RN of pt's needs for meds to address his vomitting. )         Time: 6767-2094 PT Time Calculation (min) (ACUTE ONLY): 38 min   Charges:         PT G Codes:        Bernestine Amass, SPT 11/02/2014 10:51 AM

## 2014-11-02 NOTE — Progress Notes (Signed)
Initial Nutrition Assessment       INTERVENTION:  Nutrition Supplement Therapy: Will add no sugar added mightyshake BID for added nutrition (additional 300 kcals and 11 g of protein each) Meals and snacks: May need to consider carbmodified diet pending intake and blood glucose   NUTRITION DIAGNOSIS:   Inadequate oral intake related to poor appetite as evidenced by per patient/family report.    GOAL:   Patient will meet greater than or equal to 90% of their needs    MONITOR:    (Energy intake, Glucose profile)  REASON FOR ASSESSMENT:   Malnutrition Screening Tool    ASSESSMENT:      Pt s/p bilateral endarterectomy with agnioplasty Past Medical History  Diagnosis Date  . Diabetes type 2, controlled   . Nocturia   . Elevated lipids   . Hypertension   . Prostate enlargement   . Peripheral vascular disease   . Obstructive sleep apnea   . Skin cancer     Followed by Dr. Evorn Gong  . Neuropathy   . Right carotid bruit   . Tremor   . Coronary artery disease   . Skin cancer   . PVD (peripheral vascular disease)     Current Nutrition: tolerating liquids this am  Food/Nutrition-Related History: Pt reports appetite has been decreased for the last 6 months. "I don't want to fix anything to eat." " I am just so weak."  Typically eats breakfast bar and coffee in am, then may eat 2 eggs for lunch then sandwich for evening meal   Medications: colace, amaryl, aspart, Mg sulfate, Kdur, NS at 126ml/hr  Electrolyte/Renal Profile and Glucose Profile:   Recent Labs Lab 11/01/14 1906 11/02/14 0434  NA  --  138  K  --  4.4  CL  --  107  CO2  --  21*  BUN  --  19  CREATININE 1.24 1.16  CALCIUM  --  8.2*  GLUCOSE  --  231*     Last BM:8/9   Nutrition-Focused Physical Exam Findings:Nutrition-Focused physical exam completed. Findings are WDL for fat depletion, muscle depletion, and edema.     Weight Change: Pt reports weight loss of 20 pounds in the last 6  months.    Wt encounters reviewed to 06/23/14 and show weight gain    Diet Order:  Diet regular Room service appropriate?: Yes; Fluid consistency:: Thin  Skin:   reviewed  Height:   Ht Readings from Last 1 Encounters:  11/01/14 5\' 11"  (1.803 m)    Weight:   Wt Readings from Last 1 Encounters:  11/01/14 178 lb 2.1 oz (80.8 kg)     BMI:  Body mass index is 24.86 kg/(m^2).  Estimated Nutritional Needs:   Kcal:  BEE 1567 kcals (IF 1.0-1.2, AF 1.3) 2992-4268 kcals/d.  Protein:  (1.0-1.2 g/kg) 81-97 g/d  Fluid:  (30-47ml/kg) 2430-2861ml/d  EDUCATION NEEDS:   No education needs identified at this time  [place]. Zenia Resides, [place], [place] (pager)

## 2014-11-02 NOTE — Progress Notes (Signed)
Report called to Manuela Schwartz, RN informed RN pt alert and oriented; on RA; sinus rhythm/sinus brady; vss; adequate uop via foley; bilateral groin incisions with honeycomb dressing in place with old dried drainage at incision site; pt up to chair today tolerated well; advanced diet from clear liquid to carb modified pt tolerating well will continue to monitor and assess pt

## 2014-11-02 NOTE — Consult Note (Signed)
Reason for Consult: No chief complaint on file.  Referring Physician: Dr.Dew  Randy Howard is an 74 y.o. male.  HPI:  Patient is out of bed in chair and getting lunch. Feeling fine. Had surgery yesterday and tolerating food. Pain is manageable with the current pain regimen  Past Medical History  Diagnosis Date  . Diabetes type 2, controlled   . Nocturia   . Elevated lipids   . Hypertension   . Prostate enlargement   . Peripheral vascular disease   . Obstructive sleep apnea   . Skin cancer     Followed by Dr. Evorn Gong  . Neuropathy   . Right carotid bruit   . Tremor   . Coronary artery disease   . Skin cancer   . PVD (peripheral vascular disease)     Past Surgical History  Procedure Laterality Date  . Coronary artery bypass graft  2006  . Carotid artery angioplasty Left 09/30/2011  . Cardiac catheterization    . Peripheral vascular catheterization Left 10/09/2014    Procedure: Lower Extremity Angiography;  Surgeon: Algernon Huxley, MD;  Location: [facility];  Service: Cardiovascular;  Laterality: Left;  . Peripheral vascular catheterization  10/09/2014    Procedure: Lower Extremity Intervention;  Surgeon: Algernon Huxley, MD;  Location: [facility];  Service: Cardiovascular;;  . Endarterectomy femoral Bilateral 11/01/2014    Procedure: ENDARTERECTOMY FEMORAL;  Surgeon: Algernon Huxley, MD;  Location: [facility];  Service: Vascular;  Laterality: Bilateral;    Family History  Problem Relation Age of Onset  . Heart disease Sister   . Heart disease Sister   . Diabetes Sister   . Diabetes Sister   . Diabetes Brother     Social History:  reports that he quit smoking about 36 years ago. He does not have any smokeless tobacco history on file. He reports that he does not drink alcohol or use illicit drugs.  Allergies:  Allergies  Allergen Reactions  . Oxycodone Nausea Only    Medications: I have reviewed the patient's current medications.  Results for orders placed  or performed during the hospital encounter of 11/01/14 (from the past 48 hour(s))  Glucose, capillary     Status: Abnormal   Collection Time: 11/01/14 10:20 AM  Result Value Ref Range   Glucose-Capillary 162 (H) 65 - 99 mg/dL  Glucose, capillary     Status: Abnormal   Collection Time: 11/01/14  4:04 PM  Result Value Ref Range   Glucose-Capillary 143 (H) 65 - 99 mg/dL  MRSA PCR Screening     Status: None   Collection Time: 11/01/14  6:08 PM  Result Value Ref Range   MRSA by PCR NEGATIVE NEGATIVE    Comment:        The GeneXpert MRSA Assay (FDA approved for NASAL specimens only), is one component of a comprehensive MRSA colonization surveillance program. It is not intended to diagnose MRSA infection nor to guide or monitor treatment for MRSA infections.   CBC     Status: Abnormal   Collection Time: 11/01/14  7:06 PM  Result Value Ref Range   WBC 17.6 (H) 3.8 - 10.6 K/uL   RBC 4.07 (L) 4.40 - 5.90 MIL/uL   Hemoglobin 12.5 (L) 13.0 - 18.0 g/dL   HCT 38.0 (L) 40.0 - 52.0 %   MCV 93.5 80.0 - 100.0 fL   MCH 30.6 26.0 - 34.0 pg   MCHC 32.8 32.0 - 36.0 g/dL   RDW 13.5  11.5 - 14.5 %   Platelets 132 (L) 150 - 440 K/uL  Creatinine, serum     Status: Abnormal   Collection Time: 11/01/14  7:06 PM  Result Value Ref Range   Creatinine, Ser 1.24 0.61 - 1.24 mg/dL   GFR calc non Af Amer 55 (L) >60 mL/min   GFR calc Af Amer >60 >60 mL/min    Comment: (NOTE) The eGFR has been calculated using the CKD EPI equation. This calculation has not been validated in all clinical situations. eGFR's persistently <60 mL/min signify possible Chronic Kidney Disease.   Glucose, capillary     Status: Abnormal   Collection Time: 11/01/14  9:28 PM  Result Value Ref Range   Glucose-Capillary 244 (H) 65 - 99 mg/dL   Comment 1 Notify RN   CBC     Status: Abnormal   Collection Time: 11/02/14  4:34 AM  Result Value Ref Range   WBC 11.9 (H) 3.8 - 10.6 K/uL   RBC 3.74 (L) 4.40 - 5.90 MIL/uL   Hemoglobin  11.5 (L) 13.0 - 18.0 g/dL   HCT 34.8 (L) 40.0 - 52.0 %   MCV 93.1 80.0 - 100.0 fL   MCH 30.7 26.0 - 34.0 pg   MCHC 33.0 32.0 - 36.0 g/dL   RDW 13.4 11.5 - 14.5 %   Platelets 121 (L) 150 - 440 K/uL  Basic metabolic panel     Status: Abnormal   Collection Time: 11/02/14  4:34 AM  Result Value Ref Range   Sodium 138 135 - 145 mmol/L   Potassium 4.4 3.5 - 5.1 mmol/L   Chloride 107 101 - 111 mmol/L   CO2 21 (L) 22 - 32 mmol/L   Glucose, Bld 231 (H) 65 - 99 mg/dL   BUN 19 6 - 20 mg/dL   Creatinine, Ser 1.16 0.61 - 1.24 mg/dL   Calcium 8.2 (L) 8.9 - 10.3 mg/dL   GFR calc non Af Amer 60 (L) >60 mL/min   GFR calc Af Amer >60 >60 mL/min    Comment: (NOTE) The eGFR has been calculated using the CKD EPI equation. This calculation has not been validated in all clinical situations. eGFR's persistently <60 mL/min signify possible Chronic Kidney Disease.    Anion gap 10 5 - 15  Glucose, capillary     Status: Abnormal   Collection Time: 11/02/14  7:09 AM  Result Value Ref Range   Glucose-Capillary 194 (H) 65 - 99 mg/dL  Glucose, capillary     Status: Abnormal   Collection Time: 11/02/14 11:30 AM  Result Value Ref Range   Glucose-Capillary 140 (H) 65 - 99 mg/dL  Glucose, capillary     Status: Abnormal   Collection Time: 11/02/14  2:00 PM  Result Value Ref Range   Glucose-Capillary 200 (H) 65 - 99 mg/dL    No results found.  ROS:  CONSTITUTIONAL: Denies fevers, chills. Denies any fatigue, weakness.  EYES: Denies blurry vision, double vision, eye pain. EARS, NOSE, THROAT: Denies tinnitus, ear pain, hearing loss. RESPIRATORY: Denies cough, wheeze, shortness of breath.  CARDIOVASCULAR: Denies chest pain, palpitations, edema.  GASTROINTESTINAL: Denies nausea, vomiting, diarrhea, abdominal pain. Denies bright red blood per rectum. GENITOURINARY: Denies dysuria, hematuria. ENDOCRINE: Denies nocturia or thyroid problems. HEMATOLOGIC AND LYMPHATIC: Denies easy bruising or bleeding. SKIN:  Denies rash or lesion. MUSCULOSKELETAL: Denies pain in neck, back, shoulder, knees, hips or arthritic symptoms.  NEUROLOGIC: Denies paralysis, paresthesias.  PSYCHIATRIC: Denies anxiety or depressive symptoms. Blood pressure 122/64, pulse 66, temperature  97.8 F (36.6 C), temperature source Oral, resp. rate 15, height 5' 11"  (1.803 m), weight 80.8 kg (178 lb 2.1 oz), SpO2 99 %.   PHYSICAL EXAMINATION:  GENERAL: Well-nourished, well-developed ,currently in no acute distress.  HEAD: Normocephalic, atraumatic.  EYES: Pupils equal, round, and reactive to light. Extraocular muscles intact. No scleral icterus.  MOUTH: Moist mucosal membranes. Dentition intact. No abscess noted. EARS, NOSE, THROAT: Clear without exudates. No external lesions.  NECK: Supple. No thyromegaly. No nodules. No JVD.  PULMONARY: Clear to auscultation bilaterally without wheezes, rales, or rhonchi. No use of accessory muscles. Good respiratory effort. CHEST: Nontender to palpation.  CARDIOVASCULAR: S1, S2, regular rate and rhythm. No murmurs, rubs, or gallops.  GASTROINTESTINAL: Soft, nontender, nondistended. No masses. Positive bowel sounds. No hepatosplenomegaly. MUSCULOSKELETAL: No swelling, clubbing, edema. Clear dressing of the wound bilaterally NEUROLOGIC: Cranial nerves II-XII intact. No gross focal neurological deficits. Sensation intact. Reflexes intact. SKIN: No ulcerations, lesions, rash, cyanosis. Skin warm, dry. Turgor intact. PSYCHIATRIC: Mood, affect within normal limits. Patient awake, alert, oriented x 3. Insight and judgment intact.   Assessment/Plan:  1. Type 2 diabetes:  Continue sliding scale insulin. Continue Amaryl. Hold metformin. Blood sugars will be monitored closely.  2. Essential hypertension:  Patient's blood pressure is steady at this point. Continue metoprolol, Norvasc and Benzapril.  3. BPH: Continue finasteride and Flomax  4. CAD: Continue aspirin, Plavix, metoprolol. According to  the patient his lovastatin has been on hold as per his cardiologist Dr.Callwood.  5. Peripheral vascular disease: s/p Left common femoral, profunda femoris, and superficial femoral artery endarterectomies with Cormatrix patch angioplasty and Right common femoral, profunda femoris, and superficial femoral artery endarterectomies with Cormatrix patch angioplasty. Management as per vascular  Patient is getting transferred to floor   TOTAL TIME TAKING CARE OF THIS PATIENT: 33  minutes.  @[facility] @ Pager - [phone] 11/02/2014, 2:48 PM

## 2014-11-02 NOTE — Progress Notes (Signed)
[facility]  Daily Progress Note   Subjective  - 1 Day Post-Op  Fair amount of pain.  Some nausea and dizziness.  Did not do well with PT  Objective Filed Vitals:   11/02/14 1115 11/02/14 1200 11/02/14 1300 11/02/14 1513  BP:  126/55 122/64 150/54  Pulse: 61 55 66 70  Temp:    97.7 F (36.5 C)  TempSrc:    Oral  Resp: 18 19 15 19   Height:      Weight:      SpO2: 100% 99% 99% 98%    Intake/Output Summary (Last 24 hours) at 11/02/14 1643 Last data filed at 11/02/14 1100  Gross per 24 hour  Intake 2766.67 ml  Output    875 ml  Net 1891.67 ml    PULM  CTAB CV  RRR VASC  Palpable pedal pulses  Laboratory CBC    Component Value Date/Time   WBC 11.9* 11/02/2014 0434   HGB 11.5* 11/02/2014 0434   HCT 34.8* 11/02/2014 0434   PLT 121* 11/02/2014 0434    BMET    Component Value Date/Time   NA 138 11/02/2014 0434   NA 142 07/30/2013   NA 141 01/05/2012 0724   K 4.4 11/02/2014 0434   K 3.8 01/05/2012 0724   CL 107 11/02/2014 0434   CL 106 01/05/2012 0724   CO2 21* 11/02/2014 0434   CO2 28 01/05/2012 0724   GLUCOSE 231* 11/02/2014 0434   GLUCOSE 152* 01/05/2012 0724   BUN 19 11/02/2014 0434   BUN 15 07/30/2013   BUN 24* 01/05/2012 0724   CREATININE 1.16 11/02/2014 0434   CREATININE 1.2 07/30/2013   CREATININE 1.19 01/05/2012 0724   CALCIUM 8.2* 11/02/2014 0434   CALCIUM 9.0 01/05/2012 0724   GFRNONAA 60* 11/02/2014 0434   GFRNONAA >60 01/05/2012 0724   GFRAA >60 11/02/2014 0434   GFRAA >60 01/05/2012 0724    Assessment/Planning: POD #1 s/p bilateral femoral endarterectomies   Doing well  To floor  Regular diet  PT evaluation     Randy Howard  11/02/2014, 4:43 PM

## 2014-11-02 NOTE — Progress Notes (Signed)
Pt accepted int ransfer from [facility]. He is alert and oriented. No c/o pain. Foley draining clear. bilat femoral  Dressings intact. Preprandial fs 157. Vss. Feint pedal pulses felt. Tele nsr.

## 2014-11-03 LAB — GLUCOSE, CAPILLARY
GLUCOSE-CAPILLARY: 178 mg/dL — AB (ref 65–99)
Glucose-Capillary: 194 mg/dL — ABNORMAL HIGH (ref 65–99)
Glucose-Capillary: 144 mg/dL — ABNORMAL HIGH (ref 65–99)
Glucose-Capillary: 130 mg/dL — ABNORMAL HIGH (ref 65–99)

## 2014-11-03 LAB — SURGICAL PATHOLOGY

## 2014-11-03 NOTE — Progress Notes (Signed)
A&O. Very weak. Complains of soreness at incision sites but refuses pain meds. Dressings CD&I. Pedal pulses very weak but palpable. On room air. No s/s distress.

## 2014-11-03 NOTE — Consult Note (Signed)
Reason for Consult: No chief complaint on file.  Referring Physician: Dr.Dew  Randy Howard is an 74 y.o. male.  HPI:  Patient is out of bed in chair and getting lunch. Feeling fine.  Pain is manageable with the current pain regimen, but didn't work well with the physical therapy today  Past Medical History  Diagnosis Date  . Diabetes type 2, controlled   . Nocturia   . Elevated lipids   . Hypertension   . Prostate enlargement   . Peripheral vascular disease   . Obstructive sleep apnea   . Skin cancer     Followed by Dr. Evorn Gong  . Neuropathy   . Right carotid bruit   . Tremor   . Coronary artery disease   . Skin cancer   . PVD (peripheral vascular disease)     Past Surgical History  Procedure Laterality Date  . Coronary artery bypass graft  2006  . Carotid artery angioplasty Left 09/30/2011  . Cardiac catheterization    . Peripheral vascular catheterization Left 10/09/2014    Procedure: Lower Extremity Angiography;  Surgeon: Algernon Huxley, MD;  Location: [facility];  Service: Cardiovascular;  Laterality: Left;  . Peripheral vascular catheterization  10/09/2014    Procedure: Lower Extremity Intervention;  Surgeon: Algernon Huxley, MD;  Location: [facility];  Service: Cardiovascular;;  . Endarterectomy femoral Bilateral 11/01/2014    Procedure: ENDARTERECTOMY FEMORAL;  Surgeon: Algernon Huxley, MD;  Location: [facility];  Service: Vascular;  Laterality: Bilateral;    Family History  Problem Relation Age of Onset  . Heart disease Sister   . Heart disease Sister   . Diabetes Sister   . Diabetes Sister   . Diabetes Brother     Social History:  reports that he quit smoking about 36 years ago. He does not have any smokeless tobacco history on file. He reports that he does not drink alcohol or use illicit drugs.  Allergies:  Allergies  Allergen Reactions  . Oxycodone Nausea Only    Medications: I have reviewed the patient's current medications.  Results for  orders placed or performed during the hospital encounter of 11/01/14 (from the past 48 hour(s))  Glucose, capillary     Status: Abnormal   Collection Time: 11/01/14  4:04 PM  Result Value Ref Range   Glucose-Capillary 143 (H) 65 - 99 mg/dL  MRSA PCR Screening     Status: None   Collection Time: 11/01/14  6:08 PM  Result Value Ref Range   MRSA by PCR NEGATIVE NEGATIVE    Comment:        The GeneXpert MRSA Assay (FDA approved for NASAL specimens only), is one component of a comprehensive MRSA colonization surveillance program. It is not intended to diagnose MRSA infection nor to guide or monitor treatment for MRSA infections.   CBC     Status: Abnormal   Collection Time: 11/01/14  7:06 PM  Result Value Ref Range   WBC 17.6 (H) 3.8 - 10.6 K/uL   RBC 4.07 (L) 4.40 - 5.90 MIL/uL   Hemoglobin 12.5 (L) 13.0 - 18.0 g/dL   HCT 38.0 (L) 40.0 - 52.0 %   MCV 93.5 80.0 - 100.0 fL   MCH 30.6 26.0 - 34.0 pg   MCHC 32.8 32.0 - 36.0 g/dL   RDW 13.5 11.5 - 14.5 %   Platelets 132 (L) 150 - 440 K/uL  Creatinine, serum     Status: Abnormal   Collection Time:  11/01/14  7:06 PM  Result Value Ref Range   Creatinine, Ser 1.24 0.61 - 1.24 mg/dL   GFR calc non Af Amer 55 (L) >60 mL/min   GFR calc Af Amer >60 >60 mL/min    Comment: (NOTE) The eGFR has been calculated using the CKD EPI equation. This calculation has not been validated in all clinical situations. eGFR's persistently <60 mL/min signify possible Chronic Kidney Disease.   Glucose, capillary     Status: Abnormal   Collection Time: 11/01/14  9:28 PM  Result Value Ref Range   Glucose-Capillary 244 (H) 65 - 99 mg/dL   Comment 1 Notify RN   CBC     Status: Abnormal   Collection Time: 11/02/14  4:34 AM  Result Value Ref Range   WBC 11.9 (H) 3.8 - 10.6 K/uL   RBC 3.74 (L) 4.40 - 5.90 MIL/uL   Hemoglobin 11.5 (L) 13.0 - 18.0 g/dL   HCT 34.8 (L) 40.0 - 52.0 %   MCV 93.1 80.0 - 100.0 fL   MCH 30.7 26.0 - 34.0 pg   MCHC 33.0 32.0 -  36.0 g/dL   RDW 13.4 11.5 - 14.5 %   Platelets 121 (L) 150 - 440 K/uL  Basic metabolic panel     Status: Abnormal   Collection Time: 11/02/14  4:34 AM  Result Value Ref Range   Sodium 138 135 - 145 mmol/L   Potassium 4.4 3.5 - 5.1 mmol/L   Chloride 107 101 - 111 mmol/L   CO2 21 (L) 22 - 32 mmol/L   Glucose, Bld 231 (H) 65 - 99 mg/dL   BUN 19 6 - 20 mg/dL   Creatinine, Ser 1.16 0.61 - 1.24 mg/dL   Calcium 8.2 (L) 8.9 - 10.3 mg/dL   GFR calc non Af Amer 60 (L) >60 mL/min   GFR calc Af Amer >60 >60 mL/min    Comment: (NOTE) The eGFR has been calculated using the CKD EPI equation. This calculation has not been validated in all clinical situations. eGFR's persistently <60 mL/min signify possible Chronic Kidney Disease.    Anion gap 10 5 - 15  Glucose, capillary     Status: Abnormal   Collection Time: 11/02/14  7:09 AM  Result Value Ref Range   Glucose-Capillary 194 (H) 65 - 99 mg/dL  Glucose, capillary     Status: Abnormal   Collection Time: 11/02/14 11:30 AM  Result Value Ref Range   Glucose-Capillary 140 (H) 65 - 99 mg/dL  Glucose, capillary     Status: Abnormal   Collection Time: 11/02/14  2:00 PM  Result Value Ref Range   Glucose-Capillary 200 (H) 65 - 99 mg/dL  Glucose, capillary     Status: Abnormal   Collection Time: 11/02/14  4:31 PM  Result Value Ref Range   Glucose-Capillary 159 (H) 65 - 99 mg/dL  Glucose, capillary     Status: Abnormal   Collection Time: 11/02/14  8:22 PM  Result Value Ref Range   Glucose-Capillary 168 (H) 65 - 99 mg/dL  Glucose, capillary     Status: Abnormal   Collection Time: 11/03/14  7:46 AM  Result Value Ref Range   Glucose-Capillary 194 (H) 65 - 99 mg/dL  Glucose, capillary     Status: Abnormal   Collection Time: 11/03/14 11:43 AM  Result Value Ref Range   Glucose-Capillary 144 (H) 65 - 99 mg/dL    No results found.  ROS:  CONSTITUTIONAL: Denies fevers, chills. Denies any fatigue, weakness.  EYES: Denies  blurry vision, double  vision, eye pain. EARS, NOSE, THROAT: Denies tinnitus, ear pain, hearing loss. RESPIRATORY: Denies cough, wheeze, shortness of breath.  CARDIOVASCULAR: Denies chest pain, palpitations, edema.  GASTROINTESTINAL: Denies nausea, vomiting, diarrhea, abdominal pain. Denies bright red blood per rectum. GENITOURINARY: Denies dysuria, hematuria. ENDOCRINE: Denies nocturia or thyroid problems. HEMATOLOGIC AND LYMPHATIC: Denies easy bruising or bleeding. SKIN: Denies rash or lesion. MUSCULOSKELETAL: Denies pain in neck, back, shoulder, knees, hips or arthritic symptoms.  NEUROLOGIC: Denies paralysis, paresthesias.  PSYCHIATRIC: Denies anxiety or depressive symptoms. Blood pressure 125/55, pulse 54, temperature 98.7 F (37.1 C), temperature source Oral, resp. rate 16, height _0  (1.803 m), weight 80.8 kg (178 lb 2.1 oz), SpO2 96 %.   PHYSICAL EXAMINATION:  GENERAL: Well-nourished, well-developed ,currently in no acute distress.  HEAD: Normocephalic, atraumatic.  EYES: Pupils equal, round, and reactive to light. Extraocular muscles intact. No scleral icterus.  MOUTH: Moist mucosal membranes. Dentition intact. No abscess noted. EARS, NOSE, THROAT: Clear without exudates. No external lesions.  NECK: Supple. No thyromegaly. No nodules. No JVD.  PULMONARY: Clear to auscultation bilaterally without wheezes, rales, or rhonchi. No use of accessory muscles. Good respiratory effort. CHEST: Nontender to palpation.  CARDIOVASCULAR: S1, S2, regular rate and rhythm. No murmurs, rubs, or gallops.  GASTROINTESTINAL: Soft, nontender, nondistended. No masses. Positive bowel sounds. No hepatosplenomegaly. MUSCULOSKELETAL: No swelling, clubbing, edema. Clear dressing of the wound bilaterally NEUROLOGIC: Cranial nerves II-XII intact. No gross focal neurological deficits. Sensation intact. Reflexes intact. SKIN: No ulcerations, lesions, rash, cyanosis. Skin warm, dry. Turgor intact. PSYCHIATRIC: Mood, affect within  normal limits. Patient awake, alert, oriented x 3. Insight and judgment intact.   Assessment/Plan:  1. Type 2 diabetes:  Continue sliding scale insulin. Continue Amaryl. Can resume metformin in a.m. if eating good. Blood sugars will be monitored closely.  2. Essential hypertension:  Patient's blood pressure is steady at this point. Continue metoprolol, Norvasc and Benzapril.  3. BPH: Continue finasteride and Flomax  4. CAD: Continue aspirin, Plavix, metoprolol. According to the patient his lovastatin has been on hold as per his cardiologist Dr.Callwood.  5. Peripheral vascular disease: s/p Left common femoral, profunda femoris, and superficial femoral artery endarterectomies with Cormatrix patch angioplasty and Right common femoral, profunda femoris, and superficial femoral artery endarterectomies with Cormatrix patch angioplasty. Management as per vascular  Will sign off, please call with any questions   TOTAL TIME TAKING CARE OF THIS PATIENT: 25  minutes.  _1 @ Pager - [phone] 11/03/2014, 2:51 PM

## 2014-11-03 NOTE — Care Management Important Message (Signed)
Important Message  Patient Details  Name: Randy Howard MRN: [id number] Date of Birth: December 01, 1940   Medicare Important Message Given:  Yes-second notification given    [name] 11/03/2014, 10:25 AM

## 2014-11-03 NOTE — Clinical Social Work Note (Signed)
CSW aware of patient and that consult is for SNF. CSW to complete assessment. Patient has medicare [employer] and would need prior auth before placement. Shela Leff MSW,LCSW [phone]

## 2014-11-03 NOTE — Progress Notes (Signed)
[facility]  Daily Progress Note   Subjective  - 2 Days Post-Op  Says he still feels pretty rough. No major events Did not do well with PT yesterday.  Try again today  Objective Filed Vitals:   11/02/14 1513 11/02/14 2023 11/03/14 0450 11/03/14 0735  BP: 150/54 136/51 133/58 129/45  Pulse: 70 74 80 62  Temp: 97.7 F (36.5 C) 98.2 F (36.8 C) 98.9 F (37.2 C) 99.1 F (37.3 C)  TempSrc: Oral Oral Oral   Resp: 19 20 20 18   Height:      Weight:      SpO2: 98% 99% 95% 95%    Intake/Output Summary (Last 24 hours) at 11/03/14 0916 Last data filed at 11/03/14 0805  Gross per 24 hour  Intake    400 ml  Output   2000 ml  Net  -1600 ml    PULM  CTAB CV  RRR VASC  Palpable pedal pulses, incisions C/D/I  Laboratory CBC    Component Value Date/Time   WBC 11.9* 11/02/2014 0434   HGB 11.5* 11/02/2014 0434   HCT 34.8* 11/02/2014 0434   PLT 121* 11/02/2014 0434    BMET    Component Value Date/Time   NA 138 11/02/2014 0434   NA 142 07/30/2013   NA 141 01/05/2012 0724   K 4.4 11/02/2014 0434   K 3.8 01/05/2012 0724   CL 107 11/02/2014 0434   CL 106 01/05/2012 0724   CO2 21* 11/02/2014 0434   CO2 28 01/05/2012 0724   GLUCOSE 231* 11/02/2014 0434   GLUCOSE 152* 01/05/2012 0724   BUN 19 11/02/2014 0434   BUN 15 07/30/2013   BUN 24* 01/05/2012 0724   CREATININE 1.16 11/02/2014 0434   CREATININE 1.2 07/30/2013   CREATININE 1.19 01/05/2012 0724   CALCIUM 8.2* 11/02/2014 0434   CALCIUM 9.0 01/05/2012 0724   GFRNONAA 60* 11/02/2014 0434   GFRNONAA >60 01/05/2012 0724   GFRAA >60 11/02/2014 0434   GFRAA >60 01/05/2012 0724    Assessment/Planning: POD #2 s/p bilateral femoral endarterectomies   Doing OK  PT to work with to assess home safety  Regular diet      Randy Howard  11/03/2014, 9:16 AM

## 2014-11-04 LAB — GLUCOSE, CAPILLARY
GLUCOSE-CAPILLARY: 149 mg/dL — AB (ref 65–99)
GLUCOSE-CAPILLARY: 206 mg/dL — AB (ref 65–99)
GLUCOSE-CAPILLARY: 169 mg/dL — AB (ref 65–99)

## 2014-11-04 NOTE — Progress Notes (Signed)
A&O. Resting in bed all night. Foley in  place. Bil dressings to groin in place, CD&I. No complaints through the night. Pedal pulses palpable but very weak.

## 2014-11-04 NOTE — Progress Notes (Signed)
 Vein & Vascular Surgery  Daily Progress Note   Subjective: 3 Days Post-Op: bilateral iliofemoral endarterectomy with Cormatrix patch angioplasty for atherosclerotic occlusive disease bilateral lower extremities with lifestyle limiting claudication and mild rest pain symptoms.  Patient laying comfortable in bed. Continues to be very resistant with PT and nursing with ambulation, states he experiences pain and weakness in his lower extremities when he stands.   Objective: Filed Vitals:   11/03/14 1958 11/04/14 0509 11/04/14 0800 11/04/14 1142  BP: 135/49 152/52 139/58 147/67  Pulse: 65 59 69 63  Temp: 98.5 F (36.9 C) 98.9 F (37.2 C) 98.4 F (36.9 C) 99 F (37.2 C)  TempSrc: Oral Oral  Oral  Resp: 25 22 20 16   Height:      Weight:      SpO2: 98% 97% 98% 99%    Intake/Output Summary (Last 24 hours) at 11/04/14 1336 Last data filed at 11/04/14 0830  Gross per 24 hour  Intake    340 ml  Output   1625 ml  Net  -1285 ml    Physical Exam: A&Ox3, NAD CV: RRR Pulmonary: CTA Bilaterally Abdomen: Soft, Nontender, Nondistended Vascular: GU: foley intact, draining clear urine Right: OR dressing removed. Incision clean, dry and intact. Thigh soft, calf soft, feet warm, DP palpable pulses. Left: OR dressing removed. Incision clean, dry and intact. Thigh soft, calf soft, feet warm, DP palpable pulses.   Laboratory: CBC    Component Value Date/Time   WBC 11.9* 11/02/2014 0434   HGB 11.5* 11/02/2014 0434   HCT 34.8* 11/02/2014 0434   PLT 121* 11/02/2014 0434    BMET    Component Value Date/Time   NA 138 11/02/2014 0434   NA 142 07/30/2013   NA 141 01/05/2012 0724   K 4.4 11/02/2014 0434   K 3.8 01/05/2012 0724   CL 107 11/02/2014 0434   CL 106 01/05/2012 0724   CO2 21* 11/02/2014 0434   CO2 28 01/05/2012 0724   GLUCOSE 231* 11/02/2014 0434   GLUCOSE 152* 01/05/2012 0724   BUN 19 11/02/2014 0434   BUN 15 07/30/2013   BUN 24* 01/05/2012 0724   CREATININE  1.16 11/02/2014 0434   CREATININE 1.2 07/30/2013   CREATININE 1.19 01/05/2012 0724   CALCIUM 8.2* 11/02/2014 0434   CALCIUM 9.0 01/05/2012 0724   GFRNONAA 60* 11/02/2014 0434   GFRNONAA >60 01/05/2012 0724   GFRAA >60 11/02/2014 0434   GFRAA >60 01/05/2012 0724    Assessment/Planning: 3 Days Post-Op: bilateral iliofemoral endarterectomy with Cormatrix patch angioplasty for atherosclerotic occlusive disease bilateral lower extremities with lifestyle limiting claudication and mild rest pain symptoms - stable 1) Foley will be removed today, was kept in longer due to patient being immobile after surgery. 2) Strongly encouraged OOB / working with PT with patient. 3) Consult to social work as the patient will most likely need subacute or acute rehab as he is very resistant to work with PT and nursing to ambulate.   Marcelle Overlie PA-C 11/04/2014 1:36 PM

## 2014-11-05 LAB — GLUCOSE, CAPILLARY
GLUCOSE-CAPILLARY: 170 mg/dL — AB (ref 65–99)
GLUCOSE-CAPILLARY: 142 mg/dL — AB (ref 65–99)
Glucose-Capillary: 153 mg/dL — ABNORMAL HIGH (ref 65–99)

## 2014-11-05 LAB — CBC
HCT: 32.5 % — ABNORMAL LOW (ref 40.0–52.0)
HEMOGLOBIN: 10.9 g/dL — AB (ref 13.0–18.0)
MCH: 30.7 pg (ref 26.0–34.0)
MCHC: 33.6 g/dL (ref 32.0–36.0)
MCV: 91.3 fL (ref 80.0–100.0)
PLATELETS: 117 10*3/uL — AB (ref 150–440)
RBC: 3.56 MIL/uL — AB (ref 4.40–5.90)
RDW: 13.2 % (ref 11.5–14.5)
WBC: 9.9 10*3/uL (ref 3.8–10.6)

## 2014-11-05 LAB — BASIC METABOLIC PANEL
ANION GAP: 6 (ref 5–15)
BUN: 18 mg/dL (ref 6–20)
CALCIUM: 8.3 mg/dL — AB (ref 8.9–10.3)
CO2: 29 mmol/L (ref 22–32)
CREATININE: 1.11 mg/dL (ref 0.61–1.24)
Chloride: 103 mmol/L (ref 101–111)
GFR calc non Af Amer: >60 mL/min (ref >60)
GLUCOSE: 161 mg/dL — AB (ref 65–99)
Potassium: 3.8 mmol/L (ref 3.5–5.1)
Sodium: 138 mmol/L (ref 135–145)

## 2014-11-05 LAB — MAGNESIUM: MAGNESIUM: 2 mg/dL (ref 1.7–2.4)

## 2014-11-05 LAB — PHOSPHORUS: Phosphorus: 2.2 mg/dL — ABNORMAL LOW (ref 2.5–4.6)

## 2014-11-05 MED ORDER — PANTOPRAZOLE SODIUM 40 MG PO TBEC
40.0000 mg | DELAYED_RELEASE_TABLET | Freq: Every day | ORAL | Status: DC
  Administered 2014-11-06 – 2014-11-08 (×3): 40 mg via ORAL
  Filled 2014-11-05 (×3): qty 1

## 2014-11-05 NOTE — Plan of Care (Signed)
Problem: Phase I Progression Outcomes Goal: Initial discharge plan identified Outcome: Progressing Home vs. rehab

## 2014-11-05 NOTE — Progress Notes (Signed)
A&O. Very weak. Incision sites look unremarkable. Pulses palpable but very weak. Medicated once for pain at beginning of shift. Slept well through the night.

## 2014-11-05 NOTE — Progress Notes (Signed)
Went into see if pt was ready to ambulate, sleeping at present without distress

## 2014-11-05 NOTE — Progress Notes (Signed)
[facility]  Daily Progress Note  Subjective: 4 Days Post-Op: bilateral iliofemoral endarterectomy with Cormatrix patch angioplasty for atherosclerotic occlusive disease bilateral lower extremities with lifestyle limiting claudication and mild rest pain symptoms.  Patient laying comfortably in bed. Foley removed yesterday without issue. Continues to be very resistant with ambulation, he has not ambulated - states he continues to experience pain and weakness in his lower extremities when he stands. We had a long discussion regarding the importance of ambulation. I strongly encouraged him to ambulate with nursing today. We also discussed discharge planning and possible placement in rehab upon discharge due to his need for assistance with ambulation.   Objective: Filed Vitals:   11/04/14 1142 11/04/14 2016 11/05/14 0522 11/05/14 0802  BP: 147/67 164/64 151/67 148/56  Pulse: 63 81 68 68  Temp: 99 F (37.2 C) 98.5 F (36.9 C) 98.1 F (36.7 C) 98.3 F (36.8 C)  TempSrc: Oral Oral Oral Oral  Resp: 16 18 18 16   Height:      Weight:      SpO2: 99% 98% 95% 97%    Intake/Output Summary (Last 24 hours) at 11/05/14 1050 Last data filed at 11/05/14 0900  Gross per 24 hour  Intake    725 ml  Output   2775 ml  Net  -2050 ml    Physical Exam: A&Ox3, NAD CV: RRR Pulmonary: CTA Bilaterally Abdomen: Soft, Nontender, Nondistended Vascular: Right: Incision clean, dry and intact. Thigh soft, calf soft, feet warm, DP palpable pulses. Left: Incision clean, dry and intact. Thigh soft, calf soft, feet warm, DP palpable pulses.   Laboratory: CBC    Component Value Date/Time   WBC 9.9 11/05/2014 0418   HGB 10.9* 11/05/2014 0418   HCT 32.5* 11/05/2014 0418   PLT 117* 11/05/2014 0418    BMET    Component Value Date/Time   NA 138 11/05/2014 0418   NA 142 07/30/2013   NA 141 01/05/2012 0724   K 3.8 11/05/2014 0418   K 3.8 01/05/2012 0724   CL 103 11/05/2014 0418   CL  106 01/05/2012 0724   CO2 29 11/05/2014 0418   CO2 28 01/05/2012 0724   GLUCOSE 161* 11/05/2014 0418   GLUCOSE 152* 01/05/2012 0724   BUN 18 11/05/2014 0418   BUN 15 07/30/2013   BUN 24* 01/05/2012 0724   CREATININE 1.11 11/05/2014 0418   CREATININE 1.2 07/30/2013   CREATININE 1.19 01/05/2012 0724   CALCIUM 8.3* 11/05/2014 0418   CALCIUM 9.0 01/05/2012 0724   GFRNONAA >60 11/05/2014 0418   GFRNONAA >60 01/05/2012 0724   GFRAA >60 11/05/2014 0418   GFRAA >60 01/05/2012 0724    Assessment/Planning: 4 Days Post-Op: bilateral iliofemoral endarterectomy with Cormatrix patch angioplasty for atherosclerotic occlusive disease bilateral lower extremities with lifestyle limiting claudication and mild rest pain symptoms - stable 1) Awaiting social work recommendations / Biochemist, clinical for rehab placement 2) PT / ambulation  [employer] PA-C 11/05/2014 10:50 AM

## 2014-11-05 NOTE — Progress Notes (Addendum)
Assisted pt oob with walker, very unsteady and unable to ambulate more than 3 steps, states his rt leg/thigh area is burning.  Assisted back to bed.  Pt requesting to leave bunny/heel boots off for a little bit.

## 2014-11-06 LAB — GLUCOSE, CAPILLARY
GLUCOSE-CAPILLARY: 159 mg/dL — AB (ref 65–99)
Glucose-Capillary: 224 mg/dL — ABNORMAL HIGH (ref 65–99)
Glucose-Capillary: 127 mg/dL — ABNORMAL HIGH (ref 65–99)
Glucose-Capillary: 217 mg/dL — ABNORMAL HIGH (ref 65–99)

## 2014-11-06 NOTE — Progress Notes (Signed)
Physical Therapy Treatment Patient Details Name: Randy Howard MRN: [id number] DOB: [birth year] Today's Date: 11/06/2014    History of Present Illness Pt admitted to hospital with BLE PAD and was diagnosed with severe common femoral disease.  Pt had bilat femoral endarterectomies procedure to address the diagnosis.     PT Comments    Pt reports less nausea/dizziness compared to first session and, although his bilat hip incisions/R medial thigh are still painful (7/10) with movement, it has improved from previous session (10/10).  Pt was able to transfer from supine to sit with supervision with min dizziness, dissipated in ~1 min. Pt was able to stand and ambulate for 120 ft with min assist using a RW, but would intermittently lose balance and require mod assist. Pt would continue to benefit from skilled PT in order to increase ambulation distance, improve with transfers, and increase LE strength all while decrease bilat LE pain.     Follow Up Recommendations  SNF     Equipment Recommendations  Rolling walker with 5" wheels    Recommendations for Other Services       Precautions / Restrictions Precautions Precautions: Fall Restrictions Weight Bearing Restrictions: No    Mobility  Bed Mobility Overal bed mobility: Needs Assistance Bed Mobility: Supine to Sit     Supine to sit: Supervision     General bed mobility comments: Pt reports min dizziness with sitting up, but dissipated after ~1 min  Transfers Overall transfer level: Needs assistance Equipment used: Rolling walker (2 wheeled) Transfers: Sit to/from Stand Sit to Stand: Min assist;Mod assist         General transfer comment: Pt was able to stand with min assist with RW but would intermittently lose balance secondary to bilat leg buckling and would require mod assist.    Ambulation/Gait Ambulation/Gait assistance: Min assist;Mod assist Ambulation Distance (Feet): 120 Feet Assistive device: Rolling walker (2  wheeled)     Gait velocity interpretation: <1.8 ft/sec, indicative of risk for recurrent falls General Gait Details: Pt was able to ambulate with min assist using a RW but would intermittently present with a LOB and require mod assist. Pt presents with a bilat LE circumduction during swing phase secondary to avoiding pain from knee flex.  Pt reports that it's easier to slide R foot across then lift it up during swing phase.    Stairs            Wheelchair Mobility    Modified Rankin (Stroke Patients Only)       Balance Overall balance assessment: Needs assistance Sitting-balance support: Single extremity supported;No upper extremity supported Sitting balance-Leahy Scale: Good Sitting balance - Comments: Pt would intermittently extend trunk in sitting and fell backwards but was able to cath himself with bilat UE.  Pt was able to complete unsupported EOB therex.       Standing balance-Leahy Scale: Fair Standing balance comment: Pt was able to stand with min assist using a RW and was able to ambulate for 120 ft. Bilat leg pain is biggest inhibiting factor to standing balance.                     Cognition Arousal/Alertness: Awake/alert Behavior During Therapy: WFL for tasks assessed/performed Overall Cognitive Status: Within Functional Limits for tasks assessed                      Exercises Other Exercises Other Exercises: Supine bilat ankle pumps/heel slides, 1 x 15.  Seated at EOB bilat LAQs, 1 x 10. (4min)    General Comments        Pertinent Vitals/Pain Pain Assessment: 0-10 Pain Score: 7  Pain Location: Bilat hip incisions, R medial thigh Pain Descriptors / Indicators: Sore;Spasm;Tender;Discomfort;Grimacing;Guarding Pain Intervention(s): Limited activity within patient's tolerance;Monitored during session    Home Living                      Prior Function            PT Goals (current goals can now be found in the care plan  section) Acute Rehab PT Goals Patient Stated Goal: to return home PT Goal Formulation: With patient Time For Goal Achievement: 11/16/14 Potential to Achieve Goals: Good Progress towards PT goals: Progressing toward goals    Frequency  Min 2X/week    PT Plan      Co-evaluation             End of Session Equipment Utilized During Treatment: Gait belt Activity Tolerance: Patient tolerated treatment well;Patient limited by fatigue;Patient limited by pain Patient left: in chair;with call bell/phone within reach;with chair alarm set     Time: 5974-1638 PT Time Calculation (min) (ACUTE ONLY): 38 min  Charges:                       G CodesBernestine Amass, SPT 11-09-2014 4:43 PM

## 2014-11-06 NOTE — Progress Notes (Signed)
Pt's CBG is 177, no nighttime coverage ordered.

## 2014-11-06 NOTE — Clinical Social Work Note (Addendum)
Late entry 11/06/14 for 11/05/14 Clinical Social Work Assessment  Patient Details  Name: Randy Howard MRN: [id number] Date of Birth: 05-12-1940  Date of referral:  11/02/14               Reason for consult:  Facility Placement                Permission sought to share information with:  Chartered certified accountant granted to share information::  Yes, Verbal Permission Granted  Name::        Agency::  Local snfs  Relationship::     Contact Information:     Housing/Transportation Living arrangements for the past 2 months:  Single Family Home Source of Information:  Patient Patient Interpreter Needed:  None Criminal Activity/Legal Involvement Pertinent to Current Situation/Hospitalization:  No - Comment as needed Significant Relationships:  Adult Children Lives with:  Self Do you feel safe going back to the place where you live?  No Need for family participation in patient care:  No (Coment)  Care giving concerns:  Pt lives alone, seems to be estranged from family who live in [place].    Social Worker assessment / plan:  Clinical Social Worker was referred to Pt to assist with dc planning. CSW met with Pt alone in his room to dicuss plans. Pt reports being divorced since 2001. Pt moved to [place] from [place] at that time. Pt has 3 children still in [place]. 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 Pt reports that he lives alone in a "shed" behind an old IT consultant. He states that there is power and indoor plumbing at his homoe. Pt states that he has no little contact with his adult children in [place]. He uses [facility] to get to his medical appointments and the [place] on Thursdays where he likes to play bocci ball.  CSW discussed STR at SNF with Pt and he is in agreement if he cannot walk. CSW encouraged Pt to work with PT as soon as he is able to minimize his physical decline. Pt's insurance requires preauthorization for  snf placement, which is dependent on notes from PT evaluation.   Employment status:  Disabled (Comment on whether or not currently receiving Disability) Insurance information:  Managed Medicare PT Recommendations:  [place] / Referral to community resources:   area skilled nursing facilities  Patient/Family's Response to care:  Pt states that his pain in limiting his movement at this time. Pt believes that he might be able to walk once his pain is managed. Pt is also concerned with leaving his home empty due to "living near a pack a thieves."   Patient/Family's Understanding of and Emotional Response to Diagnosis, Current Treatment, and Prognosis:  Pt reports struggling the most with his pain at this time. No family involved in Pt's care. Pt would benefit from doing a [place] if he is agreeable.   Emotional Assessment Appearance:  Appears stated age Attitude/Demeanor/Rapport:  Apprehensive Affect (typically observed):  Anxious Orientation:  Oriented to Self, Oriented to Place, Oriented to  Time, Oriented to Situation Alcohol / Substance use:  Never Used Psych involvement (Current and /or in the community):  No (Comment)  Discharge Needs  Concerns to be addressed:  Adjustment to Illness, Lack of Support Readmission within the last 30 days:  No Current discharge risk:  Lack of support system, Dependent with Mobility Barriers to  Discharge:  Continued Medical Work up   [employer], LCSW 11/06/2014, 6:05 AM

## 2014-11-06 NOTE — Progress Notes (Signed)
[facility]  Daily Progress Note   Subjective  - 5 Days Post-Op  Not that mobile yet.  PT saying likely needs SNF Eating. No events overnight  Objective Filed Vitals:   11/05/14 2025 11/06/14 0522 11/06/14 0800 11/06/14 1159  BP: 128/75 132/52 134/51 129/56  Pulse: 83 64 63   Temp: 98.6 F (37 C) 98.5 F (36.9 C)  97.2 F (36.2 C)  TempSrc: Oral Oral    Resp: 18 18 20 18   Height:      Weight:      SpO2: 100% 97% 100%     Intake/Output Summary (Last 24 hours) at 11/06/14 1650 Last data filed at 11/06/14 1352  Gross per 24 hour  Intake    600 ml  Output    460 ml  Net    140 ml    PULM  CTAB CV  RRR VASC  Feet warm, good pedal pulses  Laboratory CBC    Component Value Date/Time   WBC 9.9 11/05/2014 0418   HGB 10.9* 11/05/2014 0418   HCT 32.5* 11/05/2014 0418   PLT 117* 11/05/2014 0418    BMET    Component Value Date/Time   NA 138 11/05/2014 0418   NA 142 07/30/2013   NA 141 01/05/2012 0724   K 3.8 11/05/2014 0418   K 3.8 01/05/2012 0724   CL 103 11/05/2014 0418   CL 106 01/05/2012 0724   CO2 29 11/05/2014 0418   CO2 28 01/05/2012 0724   GLUCOSE 161* 11/05/2014 0418   GLUCOSE 152* 01/05/2012 0724   BUN 18 11/05/2014 0418   BUN 15 07/30/2013   BUN 24* 01/05/2012 0724   CREATININE 1.11 11/05/2014 0418   CREATININE 1.2 07/30/2013   CREATININE 1.19 01/05/2012 0724   CALCIUM 8.3* 11/05/2014 0418   CALCIUM 9.0 01/05/2012 0724   GFRNONAA >60 11/05/2014 0418   GFRNONAA >60 01/05/2012 0724   GFRAA >60 11/05/2014 0418   GFRAA >60 01/05/2012 0724    Assessment/Planning: POD #5 s/p bilateral femoral endarterectomies   Not doing much with PT  May need rehab  Reassess activity level tomorrow and plan discharge    DEW,JASON  11/06/2014, 4:50 PM

## 2014-11-06 NOTE — Clinical Social Work Note (Signed)
Nursing stated to CSW that patient is willing to work with PT. Will await to see how patient does with PT and then speak with patient again regarding rehab. Shela Leff MSW,LCSW [phone]

## 2014-11-07 LAB — GLUCOSE, CAPILLARY
GLUCOSE-CAPILLARY: 152 mg/dL — AB (ref 65–99)
GLUCOSE-CAPILLARY: 171 mg/dL — AB (ref 65–99)
Glucose-Capillary: 172 mg/dL — ABNORMAL HIGH (ref 65–99)
Glucose-Capillary: 158 mg/dL — ABNORMAL HIGH (ref 65–99)

## 2014-11-07 MED ORDER — ACETAMINOPHEN 325 MG PO TABS: ORAL_TABLET | ORAL | Status: DC

## 2014-11-07 MED ORDER — INSULIN ASPART 100 UNIT/ML ~~LOC~~ SOLN: 0.0000 [IU] | Freq: Three times a day (TID) | SUBCUTANEOUS | Status: DC

## 2014-11-07 MED ORDER — HYDROCODONE-ACETAMINOPHEN 5-325 MG PO TABS: 1.0000 | ORAL_TABLET | Freq: Four times a day (QID) | ORAL | Status: DC | PRN

## 2014-11-07 NOTE — Clinical Social Work Note (Signed)
Patient has chosen [facility] and CSW has informed [facility] and they will initiate auth with medicare human[employer].  Shela Leff MSW,LCSW [phone]

## 2014-11-07 NOTE — Discharge Instructions (Signed)
Patient may shower.  Keep incisions clean and dry.

## 2014-11-07 NOTE — Progress Notes (Signed)
Nutrition Follow-up    INTERVENTION:   Meals and Snacks: Cater to patient preferences Medical Food Supplement Therapy: continue Sugarfree Mighty Shakes BID   NUTRITION DIAGNOSIS:   Inadequate oral intake related to poor appetite as evidenced by per patient/family report.  GOAL:   Patient will meet greater than or equal to 90% of their needs  MONITOR:    (Energy intake, Glucose profile)  REASON FOR ASSESSMENT:   Malnutrition Screening Tool    ASSESSMENT:   Pt working with PT on visit today  Diet Order:  Diet Carb Modified Fluid consistency:: Thin; Room service appropriate?: Yes   Energy Intake: recorded po intake 95% of meals on average  Electrolyte and Renal Profile:  Recent Labs Lab 11/01/14 1906 11/02/14 0434 11/05/14 0418  BUN  --  19 18  CREATININE 1.24 1.16 1.11  NA  --  138 138  K  --  4.4 3.8  MG  --   --  2.0  PHOS  --   --  2.2*   Glucose Profile:  Recent Labs  11/06/14 2027 11/07/14 0734 11/07/14 1133  GLUCAP 217* 172* 158*   Meds: ss novolog, senokot, Kcl  Height:   Ht Readings from Last 1 Encounters:  11/01/14 5\' 11"  (1.803 m)    Weight: no new weight since admission  Wt Readings from Last 1 Encounters:  11/01/14 178 lb 2.1 oz (80.8 kg)   Filed Weights   11/01/14 1900  Weight: 178 lb 2.1 oz (80.8 kg)    BMI:  Body mass index is 24.86 kg/(m^2).  Estimated Nutritional Needs:   Kcal:  BEE 1567 kcals (IF 1.0-1.2, AF 1.3) 6256-3893 kcals/d.  Protein:  (1.0-1.2 g/kg) 81-97 g/d  Fluid:  (30-77ml/kg) 2430-288ml/d  EDUCATION NEEDS:   No education needs identified at this time  [place], RD, LDN [phone] Pager

## 2014-11-07 NOTE — Clinical Social Work Note (Signed)
Patient evaluated by PT yesterday afternoon and they have recommended rehab. CSW spoke with patient who is agreeable to this and understands that his insurance will have to authorize this. CSW has initiated bedsearch today. Will follow up with patient. Shela Leff MSW,LCSW

## 2014-11-07 NOTE — Progress Notes (Signed)
[facility]  Daily Progress Note  Subjective: 6 Days Post-Op: bilateral iliofemoral endarterectomy with Cormatrix patch angioplasty for atherosclerotic occlusive disease bilateral lower extremities with lifestyle limiting claudication and mild rest pain symptoms.  Patient working with PT however still having difficulties ambulating. PT recommends SNF, primary team agrees this would be best for patient upon discharge.   Objective: Filed Vitals:   11/06/14 0800 11/06/14 1159 11/06/14 2026 11/07/14 0514  BP: 134/51 129/56 135/65 121/58  Pulse: 63  83 62  Temp:  97.2 F (36.2 C) 98 F (36.7 C) 98.3 F (36.8 C)  TempSrc:      Resp: 20 18 16 16   Height:      Weight:      SpO2: 100%  97% 96%    Intake/Output Summary (Last 24 hours) at 11/07/14 0824 Last data filed at 11/07/14 0758  Gross per 24 hour  Intake    840 ml  Output   1210 ml  Net   -370 ml    Physical Exam: A&Ox3, NAD CV: RRR Pulmonary: CTA Bilaterally Abdomen: Soft, Nontender, Nondistended Vascular: Right: Incision clean, dry and intact. Thigh soft, calf soft, feet warm, DP palpable pulses. Left: Incision clean, dry and intact. Thigh soft, calf soft, feet warm, DP palpable pulses.   Laboratory: CBC    Component Value Date/Time   WBC 9.9 11/05/2014 0418   HGB 10.9* 11/05/2014 0418   HCT 32.5* 11/05/2014 0418   PLT 117* 11/05/2014 0418    BMET    Component Value Date/Time   NA 138 11/05/2014 0418   NA 142 07/30/2013   NA 141 01/05/2012 0724   K 3.8 11/05/2014 0418   K 3.8 01/05/2012 0724   CL 103 11/05/2014 0418   CL 106 01/05/2012 0724   CO2 29 11/05/2014 0418   CO2 28 01/05/2012 0724   GLUCOSE 161* 11/05/2014 0418   GLUCOSE 152* 01/05/2012 0724   BUN 18 11/05/2014 0418   BUN 15 07/30/2013   BUN 24* 01/05/2012 0724   CREATININE 1.11 11/05/2014 0418   CREATININE 1.2 07/30/2013   CREATININE 1.19 01/05/2012 0724   CALCIUM 8.3* 11/05/2014 0418   CALCIUM 9.0 01/05/2012 0724    GFRNONAA >60 11/05/2014 0418   GFRNONAA >60 01/05/2012 0724   GFRAA >60 11/05/2014 0418   GFRAA >60 01/05/2012 0724    Assessment/Planning: 6 Days Post-Op: bilateral iliofemoral endarterectomy with Cormatrix patch angioplasty for atherosclerotic occlusive disease bilateral lower extremities with lifestyle limiting claudication and mild rest pain symptoms - stable 1) D/C to rehab pending insurance approval / social work 2) PT / ambulation  [employer] PA-C 11/07/2014 8:24 AM

## 2014-11-07 NOTE — Discharge Summary (Signed)
[facility]    Discharge Summary    Patient ID:  Randy Howard MRN: [id number] DOB/AGE: 75-Apr-1942 75 y.o.  Admit date: 11/01/2014 Discharge date: 11/07/2014 Date of Surgery: 11/01/2014 Surgeon: Surgeon(s): Randy Huxley, MD Randy Cabal, MD  Admission Diagnosis: ASO  Discharge Diagnoses:  ASO  Secondary Diagnoses: Past Medical History  Diagnosis Date  . Diabetes type 2, controlled   . Nocturia   . Elevated lipids   . Hypertension   . Prostate enlargement   . Peripheral vascular disease   . Obstructive sleep apnea   . Skin cancer     Followed by Randy Howard  . Neuropathy   . Right carotid bruit   . Tremor   . Coronary artery disease   . Skin cancer   . PVD (peripheral vascular disease)     Procedure(s): BILATERAL ENDARTERECTOMY (FEMORAL ARTERY)  Discharged Condition: Good  HPI:  The patient is a 74 year old male with extensive bilateral lower extremity peripheral artery disease.He has been experienced claudication at only a few feet and pain worrisome for early ischemic rest pain.He has undergone infrainguinal revascularization in years past and these are doing well.He now presents with severe femoral disease on angiogram not amenable to percutaneous therapy. Bilateral lower extremities are affected and this has progressed over months to years.  Hospital Course:  On 11/01/14, the patient underwent, 1. Left common femoral, profunda femoris, and superficial femoral artery endarterectomies with Cormatrix patch angioplasty 2. Right common femoral, profunda femoris, and superficial femoral artery endarterectomies with Cormatrix patch angioplasty   He tolerated the procedure without complication and was transferred from the PACU to the surgical floor with out complication. The patients diet was advanced and his foley was removed without complication. His pain was controlled with PO medication.  He received physical therapy during  his inpatient stay as he struggled to ambulate independently. Both the primary team and physical therapy recommended SNF upon discharge.  Extubated: POD: 0  Physical exam:  A&Ox3, NAD CV: RRR Pulmonary: CTA Bilaterally Abdomen: Soft, Nontender, Nondistended Vascular: Right: Incision clean, dry and intact. Thigh soft, calf soft, feet warm, DP palpable pulses. Left: Incision clean, dry and intact. Thigh soft, calf soft, feet warm, DP palpable pulses.  Post-op wounds: clean, dry, intact or healing well Pt. Ambulating with difficulty, voiding and taking PO diet without difficulty. Pt pain controlled with PO pain meds. Labs as below Complications:none  Consults:  Treatment Team:  Randy Mango, MD  (Internal Medicine)  Significant Diagnostic Studies: CBC Lab Results  Component Value Date   WBC 9.9 11/05/2014   HGB 10.9* 11/05/2014   HCT 32.5* 11/05/2014   MCV 91.3 11/05/2014   PLT 117* 11/05/2014    BMET    Component Value Date/Time   NA 138 11/05/2014 0418   NA 142 07/30/2013   NA 141 01/05/2012 0724   K 3.8 11/05/2014 0418   K 3.8 01/05/2012 0724   CL 103 11/05/2014 0418   CL 106 01/05/2012 0724   CO2 29 11/05/2014 0418   CO2 28 01/05/2012 0724   GLUCOSE 161* 11/05/2014 0418   GLUCOSE 152* 01/05/2012 0724   BUN 18 11/05/2014 0418   BUN 15 07/30/2013   BUN 24* 01/05/2012 0724   CREATININE 1.11 11/05/2014 0418   CREATININE 1.2 07/30/2013   CREATININE 1.19 01/05/2012 0724   CALCIUM 8.3* 11/05/2014 0418   CALCIUM 9.0 01/05/2012 0724   GFRNONAA >60 11/05/2014 0418   GFRNONAA >60 01/05/2012 4503  GFRAA >60 11/05/2014 0418   GFRAA >60 01/05/2012 0724   COAG Lab Results  Component Value Date   INR 1.21 10/24/2014     Disposition:  Discharge to :Skilled nursing facility    Medication List    ASK your doctor about these medications        amLODipine 5 MG tablet  Commonly known as:  NORVASC  Take 5 mg by mouth daily.     aspirin 81 MG tablet  Take 81  mg by mouth daily.     baclofen 10 MG tablet  Commonly known as:  LIORESAL  Take 10 mg by mouth 3 (three) times daily as needed for muscle spasms.     benazepril 40 MG tablet  Commonly known as:  LOTENSIN  Take 40 mg by mouth daily.     clopidogrel 75 MG tablet  Commonly known as:  PLAVIX  Take 75 mg by mouth daily.     etodolac 300 MG capsule  Commonly known as:  LODINE  Take 300 mg by mouth 2 (two) times daily as needed.     finasteride 5 MG tablet  Commonly known as:  PROSCAR  Take 5 mg by mouth daily.     furosemide 20 MG tablet  Commonly known as:  LASIX  Take 1 tablet by mouth daily.     gabapentin 100 MG capsule  Commonly known as:  NEURONTIN  Take 100 mg by mouth 3 (three) times daily.     glimepiride 4 MG tablet  Commonly known as:  AMARYL  Take 2 mg by mouth daily with breakfast.     meclizine 25 MG tablet  Commonly known as:  ANTIVERT  Take 1 tablet (25 mg total) by mouth 3 (three) times daily as needed for dizziness.     metFORMIN 1000 MG tablet  Commonly known as:  GLUCOPHAGE  Take 1,000 mg by mouth 2 (two) times daily with a meal.     metoprolol 50 MG tablet  Commonly known as:  LOPRESSOR  Take 50 mg by mouth 2 (two) times daily.     tamsulosin 0.4 MG Caps capsule  Commonly known as:  FLOMAX  Take 1 capsule by mouth daily.       Verbal and written Discharge instructions given to the patient. Wound care per Discharge AVS     Follow-up Information    Follow up with Howard,JASON, MD In 4 weeks.   Specialties:  Vascular Surgery, Radiology, Interventional Cardiology   Why:  ABI and bilateral arterial duplex   Contact information:   [place] [address] [phone]       Signed: Sela Hua, PA-C  11/07/2014, 8:10 PM

## 2014-11-07 NOTE — Progress Notes (Signed)
Physical Therapy Treatment Patient Details Name: Randy Howard MRN: [id number] DOB: March 23, 1941 Today's Date: 11/07/2014    History of Present Illness Pt admitted to hospital with BLE PAD and was diagnosed with severe common femoral disease.  Pt had bilat femoral endarterectomies procedure to address the diagnosis.     PT Comments    Pt demonstrated willingness to comply with pt today and reports less pain with active movements during today's session. Pt required supervision with bed mobility and was able to stand with min assist with RW, pt would intermittently lose balance secondary to LOB from pain in R LE. Pt lost balance with standing on first attempt and had to return to sitting, but was able to complete transfer on second attempt. Regarding ambulation, pt demonstrated improved knee flex during swing phase and had less LOBs (2) for a further distance (180 ft) when compared to the previous session.  Pt would continue to benefit from skilled PT in order to improve with transfers, increase strength in bilat LEs, and decrease pain in bilat LEs.   Follow Up Recommendations  SNF     Equipment Recommendations  Rolling walker with 5" wheels    Recommendations for Other Services       Precautions / Restrictions Precautions Precautions: Fall Restrictions Weight Bearing Restrictions: No    Mobility  Bed Mobility Overal bed mobility: Needs Assistance Bed Mobility: Supine to Sit     Supine to sit: Supervision     General bed mobility comments: Pt reports min dizziness with standing up, but dissipated after ~1 min  Transfers Overall transfer level: Needs assistance Equipment used: Rolling walker (2 wheeled) Transfers: Sit to/from Stand Sit to Stand: Min assist;Mod assist         General transfer comment: Pt was able to stand with min assist with RW but would intermittently lose balance secondary to LOB from pain in R LE. Pt lost balance with standing on first attempt and had  to return to sitting.  Pt was able to complete transfer on second attempt.   Ambulation/Gait Ambulation/Gait assistance: Min assist Ambulation Distance (Feet): 180 Feet Assistive device: Rolling walker (2 wheeled)       General Gait Details: Pt was able to ambulate with min assist using a RW throughout the entire session and demonstrated good bilat knee flex in swing phase.  Pt presents with less LOBs (2 total) during ambulation today and reported less LE pain with ambulation.    Stairs            Wheelchair Mobility    Modified Rankin (Stroke Patients Only)       Balance Overall balance assessment: Needs assistance Sitting-balance support: Single extremity supported;No upper extremity supported Sitting balance-Leahy Scale: Good Sitting balance - Comments: Pt would intermittently extend trunk in sitting and fell backwards but was able to catch himself with bilat UE.  Pt was able to complete unsupported EOB therex but was limited to ~5 min secondary to LBP.     Standing balance support: Bilateral upper extremity supported Standing balance-Leahy Scale: Fair Standing balance comment: Heavy UE compensatetion on RW with ambulation secondary to guarding from bilat LE pain.  Pt was able to ambulate for 180 ft today.                      Cognition Arousal/Alertness: Awake/alert Behavior During Therapy: WFL for tasks assessed/performed Overall Cognitive Status: Within Functional Limits for tasks assessed  Exercises Other Exercises Other Exercises: Supine bilat heel slides/hip abd, 1 x 10.  Other Exercises: Seated at EOB bilat LAQ/shoulder flex with hip add isometrics with a towel in between the legs, 1 x 10.      General Comments        Pertinent Vitals/Pain Pain Score: 7  Pain Location: Bilat hip incisions, R medial thigh Pain Descriptors / Indicators: Sore;Spasm;Tender;Grimacing;Guarding;Discomfort Pain Intervention(s): Limited  activity within patient's tolerance;Monitored during session    Home Living                      Prior Function            PT Goals (current goals can now be found in the care plan section) Acute Rehab PT Goals Patient Stated Goal: to return home PT Goal Formulation: With patient Time For Goal Achievement: 11/16/14 Potential to Achieve Goals: Good Additional Goals Additional Goal #1: Will address goal for stairs in future visit.  Progress towards PT goals: Progressing toward goals    Frequency  Min 2X/week    PT Plan      Co-evaluation             End of Session Equipment Utilized During Treatment: Gait belt Activity Tolerance: Patient tolerated treatment well;Patient limited by fatigue;Patient limited by pain Patient left: in chair;with call bell/phone within reach;with chair alarm set     Time: 1417-1450 PT Time Calculation (min) (ACUTE ONLY): 33 min  Charges:                       G CodesBernestine Amass, SPT 11-28-2014 3:05 PM

## 2014-11-08 LAB — CREATININE, SERUM
CREATININE: 1.05 mg/dL (ref 0.61–1.24)
GFR calc Af Amer: >60 mL/min (ref >60)
GFR calc non Af Amer: >60 mL/min (ref >60)

## 2014-11-08 LAB — GLUCOSE, CAPILLARY
Glucose-Capillary: 156 mg/dL — ABNORMAL HIGH (ref 65–99)
Glucose-Capillary: 173 mg/dL — ABNORMAL HIGH (ref 65–99)

## 2014-11-08 NOTE — Progress Notes (Signed)
EMS getting patient now. IV and telemetry removed. Packet sent with EMS. No patient complaints or distress. Toniann Ket

## 2014-11-08 NOTE — Progress Notes (Signed)
Patient given discharge teaching regarding medications, diet, follow-up appointments and activity. Packet prepared by clinical social work, [employer]. Patient understanding verbalized. No complaints at this time. . IV and telemetry to be discontinued prior to leaving (EMS called for transport). Skin assessment as previously charted and vitals are stable; on room air. Patient being discharged to [facility]- report called to [facility]. Patient has informed family where he is going. No further needs by Care Management/Social Work. Waiting for EMS. Toniann Ket

## 2014-11-08 NOTE — Care Management Important Message (Signed)
Important Message  Patient Details  Name: Randy Howard MRN: [id number] Date of Birth: 1940-07-31   Medicare Important Message Given:  Yes-third notification given    [name] 11/08/2014, 10:17 AM

## 2014-11-08 NOTE — Progress Notes (Signed)
[facility]  Daily Progress Note  Subjective: 7 Days Post-Op: Bilateral iliofemoral endarterectomy with Cormatrix patch angioplasty for atherosclerotic occlusive disease bilateral lower extremities with lifestyle limiting claudication and mild rest pain symptoms.  Patient working with PT however still having difficulties ambulating. PT recommends SNF, primary team agrees this would be best for patient upon discharge. Patient has chosen a facility - awaiting approval.   Objective: Filed Vitals:   11/07/14 1130 11/07/14 2003 11/07/14 2142 11/08/14 0441  BP: 149/66 147/59  134/57  Pulse: 64 70 74 62  Temp: 97.6 F (36.4 C) 97.4 F (36.3 C)  97.3 F (36.3 C)  TempSrc: Oral Oral    Resp: 18 20  17   Height:      Weight:      SpO2: 100% 99%  98%    Intake/Output Summary (Last 24 hours) at 11/08/14 0949 Last data filed at 11/08/14 5409  Gross per 24 hour  Intake    240 ml  Output   2375 ml  Net  -2135 ml    Physical Exam: A&Ox3, NAD CV: RRR Pulmonary: CTA Bilaterally Abdomen: Soft, Nontender, Nondistended Vascular: Right: Incision clean, dry and intact. Thigh soft, calf soft, feet warm, DP palpable pulses. Left: Incision clean, dry and intact. Thigh soft, calf soft, feet warm, DP palpable pulses.  Laboratory: CBC    Component Value Date/Time   WBC 9.9 11/05/2014 0418   HGB 10.9* 11/05/2014 0418   HCT 32.5* 11/05/2014 0418   PLT 117* 11/05/2014 0418    BMET    Component Value Date/Time   NA 138 11/05/2014 0418   NA 142 07/30/2013   NA 141 01/05/2012 0724   K 3.8 11/05/2014 0418   K 3.8 01/05/2012 0724   CL 103 11/05/2014 0418   CL 106 01/05/2012 0724   CO2 29 11/05/2014 0418   CO2 28 01/05/2012 0724   GLUCOSE 161* 11/05/2014 0418   GLUCOSE 152* 01/05/2012 0724   BUN 18 11/05/2014 0418   BUN 15 07/30/2013   BUN 24* 01/05/2012 0724   CREATININE 1.05 11/08/2014 0457   CREATININE 1.2 07/30/2013   CREATININE 1.19 01/05/2012 0724   CALCIUM  8.3* 11/05/2014 0418   CALCIUM 9.0 01/05/2012 0724   GFRNONAA >60 11/08/2014 0457   GFRNONAA >60 01/05/2012 0724   GFRAA >60 11/08/2014 0457   GFRAA >60 01/05/2012 0724    Assessment/Planning: 7 Days Post-Op: bilateral iliofemoral endarterectomy with Cormatrix patch angioplasty for atherosclerotic occlusive disease bilateral lower extremities with lifestyle limiting claudication and mild rest pain symptoms - stable 1) D/C to rehab pending insurance approval / social work   [employer] PA-C 11/08/2014 9:49 AM

## 2014-11-08 NOTE — Care Management Important Message (Signed)
Important Message  Patient Details  Name: Randy Howard MRN: [id number] Date of Birth: 09-Jul-1940   Medicare Important Message Given:  Yes-third notification given    [name] 11/08/2014, 10:17 AM

## 2014-11-08 NOTE — Clinical Social Work Note (Signed)
Helene Kelp at [facility] has received auth from [facility] and MD has discharged patient today to [facility]. Patient is aware and in agreement. Patient to transport via EMS. Discharge summary sent to [facility]. Shela Leff MSW,LCSW [phone]

## 2014-11-15 LAB — GLUCOSE, CAPILLARY
Glucose-Capillary: 151 mg/dL — ABNORMAL HIGH (ref 65–99)
Glucose-Capillary: 177 mg/dL — ABNORMAL HIGH (ref 65–99)

## 2014-12-05 ENCOUNTER — Encounter: Payer: Self-pay | Admitting: Family Medicine

## 2014-12-05 ENCOUNTER — Other Ambulatory Visit: Payer: Self-pay | Admitting: *Deleted

## 2014-12-05 ENCOUNTER — Ambulatory Visit (INDEPENDENT_AMBULATORY_CARE_PROVIDER_SITE_OTHER): Payer: Commercial Managed Care - HMO | Admitting: Family Medicine

## 2014-12-05 VITALS — BP 147/66 | HR 57 | Temp 97.6°F | Resp 16 | Ht 71.0 in | Wt 173.0 lb

## 2014-12-05 DIAGNOSIS — R600 Localized edema: Secondary | ICD-10-CM

## 2014-12-05 DIAGNOSIS — I739 Peripheral vascular disease, unspecified: Secondary | ICD-10-CM

## 2014-12-05 DIAGNOSIS — J40 Bronchitis, not specified as acute or chronic: Secondary | ICD-10-CM

## 2014-12-05 DIAGNOSIS — E0842 Diabetes mellitus due to underlying condition with diabetic polyneuropathy: Secondary | ICD-10-CM | POA: Diagnosis not present

## 2014-12-05 DIAGNOSIS — I10 Essential (primary) hypertension: Secondary | ICD-10-CM

## 2014-12-05 MED ORDER — FUROSEMIDE 20 MG PO TABS: 20.0000 mg | ORAL_TABLET | Freq: Every day | ORAL | Status: DC

## 2014-12-05 MED ORDER — AZITHROMYCIN 250 MG PO TABS
ORAL_TABLET | ORAL | Status: DC
Start: 2014-12-05 — End: 2015-03-07

## 2014-12-05 MED ORDER — FUROSEMIDE 20 MG PO TABS: ORAL_TABLET | ORAL | Status: DC

## 2014-12-05 NOTE — Progress Notes (Signed)
Name: Randy Howard   MRN: [id number]    DOB: 26-Feb-1941   Date:12/05/2014       Progress Note  Subjective  Chief Complaint  Chief Complaint  Patient presents with  . surgery follow up    HPI Here to f/u after bilateral leg vascular surgery.  Out of rehab.  DM and HBP.  Not checking BS at home.  BSs at [facility] were 90-100 usually.  Has had sores on ends of great toes.  Healing b y tghem selves now.  No problem-specific assessment & plan notes found for this encounter.   Past Medical History  Diagnosis Date  . Diabetes type 2, controlled   . Nocturia   . Elevated lipids   . Hypertension   . Prostate enlargement   . Peripheral vascular disease   . Obstructive sleep apnea   . Skin cancer     Followed by Dr. Evorn Gong  . Neuropathy   . Right carotid bruit   . Tremor   . Coronary artery disease   . Skin cancer   . PVD (peripheral vascular disease)     Social History  Substance Use Topics  . Smoking status: Former Smoker    Quit date: 03/24/1978  . Smokeless tobacco: Not on file  . Alcohol Use: No     Current outpatient prescriptions:  .  amLODipine (NORVASC) 5 MG tablet, Take 5 mg by mouth daily., Disp: , Rfl:  .  aspirin 81 MG tablet, Take 81 mg by mouth daily., Disp: , Rfl:  .  baclofen (LIORESAL) 10 MG tablet, Take 10 mg by mouth 3 (three) times daily as needed for muscle spasms., Disp: , Rfl:  .  benazepril (LOTENSIN) 40 MG tablet, Take 40 mg by mouth daily., Disp: , Rfl:  .  clopidogrel (PLAVIX) 75 MG tablet, Take 75 mg by mouth daily., Disp: , Rfl:  .  etodolac (LODINE) 300 MG capsule, Take 300 mg by mouth 2 (two) times daily as needed., Disp: , Rfl:  .  finasteride (PROSCAR) 5 MG tablet, Take 5 mg by mouth daily., Disp: , Rfl:  .  furosemide (LASIX) 20 MG tablet, Take 1 tablet by mouth daily., Disp: , Rfl:  .  gabapentin (NEURONTIN) 100 MG capsule, Take 100 mg by mouth 3 (three) times daily., Disp: , Rfl:  .  glimepiride (AMARYL) 4 MG tablet, Take 2 mg by mouth daily  with breakfast., Disp: , Rfl:  .  meclizine (ANTIVERT) 25 MG tablet, Take 1 tablet (25 mg total) by mouth 3 (three) times daily as needed for dizziness., Disp: 60 tablet, Rfl: 6 .  metFORMIN (GLUCOPHAGE) 1000 MG tablet, Take 500 mg by mouth 2 (two) times daily with a meal. , Disp: , Rfl:  .  metoprolol (LOPRESSOR) 50 MG tablet, Take 50 mg by mouth 2 (two) times daily., Disp: , Rfl:  .  tamsulosin (FLOMAX) 0.4 MG CAPS capsule, Take 1 capsule by mouth daily., Disp: , Rfl:   Allergies  Allergen Reactions  . Oxycodone Nausea Only    Review of Systems  Constitutional: Negative for fever, chills, weight loss and malaise/fatigue.  HENT: Negative for hearing loss.   Eyes: Negative for blurred vision and double vision.  Respiratory: Positive for cough (had since in rehab NH.), sputum production (green sputum), shortness of breath and wheezing.   Cardiovascular: Positive for leg swelling. Negative for chest pain, palpitations and orthopnea.  Gastrointestinal: Negative for heartburn, abdominal pain and blood in stool.  Genitourinary: Negative for dysuria,  urgency and frequency.  Musculoskeletal: Negative for myalgias and joint pain.  Neurological: Negative for weakness and headaches.      Objective  Filed Vitals:   12/05/14 1306  BP: 147/66  Pulse: 57  Temp: 97.6 F (36.4 C)  TempSrc: Oral  Resp: 16  Height: 5' 11"  (1.803 m)  Weight: 173 lb (78.472 kg)     Physical Exam  Constitutional: He is well-developed, well-nourished, and in no distress. No distress.  HENT:  Head: Normocephalic and atraumatic.  Right Ear: External ear normal.  Left Ear: External ear normal.  Mouth/Throat: Oropharynx is clear and moist.  Eyes: Conjunctivae and EOM are normal. Pupils are equal, round, and reactive to light. No scleral icterus.  Neck: Neck supple. Carotid bruit is not present. No thyromegaly present.  Cardiovascular: Normal rate, regular rhythm, normal heart sounds and intact distal pulses.   Exam reveals no gallop and no friction rub.   No murmur heard. Pulmonary/Chest: Effort normal and breath sounds normal. No respiratory distress. He has no wheezes. He has no rales.  Coarse breath sounds throughout both lungs  Abdominal: He exhibits no distension, no abdominal bruit and no mass. There is no tenderness.  Musculoskeletal: He exhibits edema (2+  pedal edema bilaterally).  Lymphadenopathy:    He has no cervical adenopathy.  Skin:  Healing ulcers at ends of bilat. Great toes.  Not open at this time.  Healing well.  Vitals reviewed.     Recent Results (from the past 2160 hour(s))  POCT A1C     Status: None   Collection Time: 10/06/14 10:35 AM  Result Value Ref Range   Hemoglobin A1C 6.9   Basic metabolic panel     Status: Abnormal   Collection Time: 10/09/14  7:19 AM  Result Value Ref Range   Sodium 138 135 - 145 mmol/L   Potassium 3.9 3.5 - 5.1 mmol/L   Chloride 101 101 - 111 mmol/L   CO2 30 22 - 32 mmol/L   Glucose, Bld 162 (H) 65 - 99 mg/dL   BUN 22 (H) 6 - 20 mg/dL   Creatinine, Ser 1.31 (H) 0.61 - 1.24 mg/dL   Calcium 9.1 8.9 - 10.3 mg/dL   GFR calc non Af Amer 52 (L) >60 mL/min   GFR calc Af Amer 60 (L) >60 mL/min    Comment: (NOTE) The eGFR has been calculated using the CKD EPI equation. This calculation has not been validated in all clinical situations. eGFR's persistently <60 mL/min signify possible Chronic Kidney Disease.    Anion gap 7 5 - 15  Type and screen for Red Blood Exchange     Status: None   Collection Time: 10/24/14  9:58 AM  Result Value Ref Range   ABO/RH(D) O POS    Antibody Screen NEG    Sample Expiration 11/07/2014   CBC     Status: Abnormal   Collection Time: 10/24/14  9:58 AM  Result Value Ref Range   WBC 7.3 3.8 - 10.6 K/uL   RBC 4.39 (L) 4.40 - 5.90 MIL/uL   Hemoglobin 13.4 13.0 - 18.0 g/dL   HCT 40.0 40.0 - 52.0 %   MCV 91.2 80.0 - 100.0 fL   MCH 30.6 26.0 - 34.0 pg   MCHC 33.5 32.0 - 36.0 g/dL   RDW 13.0 11.5 - 14.5 %    Platelets 139 (L) 150 - 440 K/uL  Protime-INR     Status: Abnormal   Collection Time: 10/24/14  9:58 AM  Result  Value Ref Range   Prothrombin Time 15.5 (H) 11.4 - 15.0 seconds   INR 1.21   APTT     Status: None   Collection Time: 10/24/14  9:58 AM  Result Value Ref Range   aPTT 30 24 - 36 seconds  Basic metabolic panel     Status: Abnormal   Collection Time: 10/24/14  9:58 AM  Result Value Ref Range   Sodium 140 135 - 145 mmol/L   Potassium 4.0 3.5 - 5.1 mmol/L   Chloride 103 101 - 111 mmol/L   CO2 30 22 - 32 mmol/L   Glucose, Bld 223 (H) 65 - 99 mg/dL   BUN 26 (H) 6 - 20 mg/dL   Creatinine, Ser 1.02 0.61 - 1.24 mg/dL   Calcium 9.7 8.9 - 10.3 mg/dL   GFR calc non Af Amer >60 >60 mL/min   GFR calc Af Amer >60 >60 mL/min    Comment: (NOTE) The eGFR has been calculated using the CKD EPI equation. This calculation has not been validated in all clinical situations. eGFR's persistently <60 mL/min signify possible Chronic Kidney Disease.    Anion gap 7 5 - 15  ABO/Rh     Status: None   Collection Time: 10/24/14  9:59 AM  Result Value Ref Range   ABO/RH(D) O POS   Glucose, capillary     Status: Abnormal   Collection Time: 11/01/14 10:20 AM  Result Value Ref Range   Glucose-Capillary 162 (H) 65 - 99 mg/dL  Surgical pathology     Status: None   Collection Time: 11/01/14  2:13 PM  Result Value Ref Range   SURGICAL PATHOLOGY      Surgical Pathology CASE: [id number] PATIENT: Kharon Fessel Surgical Pathology Report     SPECIMEN SUBMITTED: A. Profunda plaque, left common femoral, superficial femoral B. Profunda plaque, right common femoral, superficial femoral  CLINICAL HISTORY: None provided  PRE-OPERATIVE DIAGNOSIS: ASO  POST-OPERATIVE DIAGNOSIS: ASO     DIAGNOSIS: A. PROFUNDA PLAQUE, LEFT COMMON FEMORAL, SUPERFICIAL FEMORAL: - CALCIFIED ATHEROMATOUS PLAQUE.  B. PROFUNDA PLAQUE, RIGHT COMMON FEMORAL, SUPERFICIAL FEMORAL: - CALCIFIED ATHEROMATOUS  PLAQUE.    GROSS DESCRIPTION:  A. Labeled: left common femoral, superficial femoral and profunda plaque Tissue Fragment(s): multiple Measurement: aggregate, 8.5 x 1.5 x 1.0 cm Comment: calcified yellow to tan fragments, sectioned  Representative submitted in cassette(s): 1 following decalcification  B. Labeled: right common femoral, superficial femoral and profunda plaque Tissue Fragment(s): multiple Measurement: aggregate,  6.2 x 4.5 x 1.1 cm Comment: orange to tan focally calcified fragments, sectioned  Representative submitted in cassette(s): 1 following decalcification     Final Diagnosis performed by Quay Burow, MD.  Electronically signed 11/03/2014 9:02:50AM    The electronic signature indicates that the named Attending Pathologist has evaluated the specimen  Technical component performed at [facility], [address] [place], [place] [address] Lab: [phone] Dir: Darrick Penna. Evette Doffing, MD  Professional component performed at [facility], [facility], [place], [place], [place] [address] Lab: [phone] Dir: Dellia Nims. Rubinas, MD    Glucose, capillary     Status: Abnormal   Collection Time: 11/01/14  4:04 PM  Result Value Ref Range   Glucose-Capillary 143 (H) 65 - 99 mg/dL  MRSA PCR Screening     Status: None   Collection Time: 11/01/14  6:08 PM  Result Value Ref Range   MRSA by PCR NEGATIVE NEGATIVE    Comment:        The GeneXpert MRSA Assay (FDA approved  for NASAL specimens only), is one component of a comprehensive MRSA colonization surveillance program. It is not intended to diagnose MRSA infection nor to guide or monitor treatment for MRSA infections.   CBC     Status: Abnormal   Collection Time: 11/01/14  7:06 PM  Result Value Ref Range   WBC 17.6 (H) 3.8 - 10.6 K/uL   RBC 4.07 (L) 4.40 - 5.90 MIL/uL   Hemoglobin 12.5 (L) 13.0 - 18.0 g/dL   HCT 38.0 (L) 40.0 - 52.0 %   MCV 93.5 80.0 - 100.0 fL   MCH 30.6 26.0 - 34.0 pg    MCHC 32.8 32.0 - 36.0 g/dL   RDW 13.5 11.5 - 14.5 %   Platelets 132 (L) 150 - 440 K/uL  Creatinine, serum     Status: Abnormal   Collection Time: 11/01/14  7:06 PM  Result Value Ref Range   Creatinine, Ser 1.24 0.61 - 1.24 mg/dL   GFR calc non Af Amer 55 (L) >60 mL/min   GFR calc Af Amer >60 >60 mL/min    Comment: (NOTE) The eGFR has been calculated using the CKD EPI equation. This calculation has not been validated in all clinical situations. eGFR's persistently <60 mL/min signify possible Chronic Kidney Disease.   Glucose, capillary     Status: Abnormal   Collection Time: 11/01/14  9:28 PM  Result Value Ref Range   Glucose-Capillary 244 (H) 65 - 99 mg/dL   Comment 1 Notify RN   CBC     Status: Abnormal   Collection Time: 11/02/14  4:34 AM  Result Value Ref Range   WBC 11.9 (H) 3.8 - 10.6 K/uL   RBC 3.74 (L) 4.40 - 5.90 MIL/uL   Hemoglobin 11.5 (L) 13.0 - 18.0 g/dL   HCT 34.8 (L) 40.0 - 52.0 %   MCV 93.1 80.0 - 100.0 fL   MCH 30.7 26.0 - 34.0 pg   MCHC 33.0 32.0 - 36.0 g/dL   RDW 13.4 11.5 - 14.5 %   Platelets 121 (L) 150 - 440 K/uL  Basic metabolic panel     Status: Abnormal   Collection Time: 11/02/14  4:34 AM  Result Value Ref Range   Sodium 138 135 - 145 mmol/L   Potassium 4.4 3.5 - 5.1 mmol/L   Chloride 107 101 - 111 mmol/L   CO2 21 (L) 22 - 32 mmol/L   Glucose, Bld 231 (H) 65 - 99 mg/dL   BUN 19 6 - 20 mg/dL   Creatinine, Ser 1.16 0.61 - 1.24 mg/dL   Calcium 8.2 (L) 8.9 - 10.3 mg/dL   GFR calc non Af Amer 60 (L) >60 mL/min   GFR calc Af Amer >60 >60 mL/min    Comment: (NOTE) The eGFR has been calculated using the CKD EPI equation. This calculation has not been validated in all clinical situations. eGFR's persistently <60 mL/min signify possible Chronic Kidney Disease.    Anion gap 10 5 - 15  Glucose, capillary     Status: Abnormal   Collection Time: 11/02/14  7:09 AM  Result Value Ref Range   Glucose-Capillary 194 (H) 65 - 99 mg/dL  Glucose, capillary      Status: Abnormal   Collection Time: 11/02/14 11:30 AM  Result Value Ref Range   Glucose-Capillary 140 (H) 65 - 99 mg/dL  Glucose, capillary     Status: Abnormal   Collection Time: 11/02/14  2:00 PM  Result Value Ref Range   Glucose-Capillary 200 (H) 65 - 99  mg/dL  Glucose, capillary     Status: Abnormal   Collection Time: 11/02/14  4:31 PM  Result Value Ref Range   Glucose-Capillary 159 (H) 65 - 99 mg/dL  Glucose, capillary     Status: Abnormal   Collection Time: 11/02/14  8:22 PM  Result Value Ref Range   Glucose-Capillary 168 (H) 65 - 99 mg/dL  Glucose, capillary     Status: Abnormal   Collection Time: 11/03/14  7:46 AM  Result Value Ref Range   Glucose-Capillary 194 (H) 65 - 99 mg/dL  Glucose, capillary     Status: Abnormal   Collection Time: 11/03/14 11:43 AM  Result Value Ref Range   Glucose-Capillary 144 (H) 65 - 99 mg/dL  Glucose, capillary     Status: Abnormal   Collection Time: 11/03/14  4:40 PM  Result Value Ref Range   Glucose-Capillary 130 (H) 65 - 99 mg/dL  Glucose, capillary     Status: Abnormal   Collection Time: 11/03/14  8:23 PM  Result Value Ref Range   Glucose-Capillary 178 (H) 65 - 99 mg/dL  Glucose, capillary     Status: Abnormal   Collection Time: 11/04/14  7:47 AM  Result Value Ref Range   Glucose-Capillary 149 (H) 65 - 99 mg/dL   Comment 1 Notify RN    Comment 2 Document in Chart   Glucose, capillary     Status: Abnormal   Collection Time: 11/04/14 11:43 AM  Result Value Ref Range   Glucose-Capillary 206 (H) 65 - 99 mg/dL   Comment 1 Notify RN    Comment 2 Document in Chart   Glucose, capillary     Status: Abnormal   Collection Time: 11/04/14  4:53 PM  Result Value Ref Range   Glucose-Capillary 151 (H) 65 - 99 mg/dL   Comment 1 Notify RN    Comment 2 Document in Chart   Glucose, capillary     Status: Abnormal   Collection Time: 11/04/14  8:16 PM  Result Value Ref Range   Glucose-Capillary 169 (H) 65 - 99 mg/dL   Comment 1 Notify RN    CBC     Status: Abnormal   Collection Time: 11/05/14  4:18 AM  Result Value Ref Range   WBC 9.9 3.8 - 10.6 K/uL   RBC 3.56 (L) 4.40 - 5.90 MIL/uL   Hemoglobin 10.9 (L) 13.0 - 18.0 g/dL   HCT 32.5 (L) 40.0 - 52.0 %   MCV 91.3 80.0 - 100.0 fL   MCH 30.7 26.0 - 34.0 pg   MCHC 33.6 32.0 - 36.0 g/dL   RDW 13.2 11.5 - 14.5 %   Platelets 117 (L) 150 - 440 K/uL  Basic metabolic panel     Status: Abnormal   Collection Time: 11/05/14  4:18 AM  Result Value Ref Range   Sodium 138 135 - 145 mmol/L   Potassium 3.8 3.5 - 5.1 mmol/L   Chloride 103 101 - 111 mmol/L   CO2 29 22 - 32 mmol/L   Glucose, Bld 161 (H) 65 - 99 mg/dL   BUN 18 6 - 20 mg/dL   Creatinine, Ser 1.11 0.61 - 1.24 mg/dL   Calcium 8.3 (L) 8.9 - 10.3 mg/dL   GFR calc non Af Amer >60 >60 mL/min   GFR calc Af Amer >60 >60 mL/min    Comment: (NOTE) The eGFR has been calculated using the CKD EPI equation. This calculation has not been validated in all clinical situations. eGFR's persistently <60 mL/min signify possible  Chronic Kidney Disease.    Anion gap 6 5 - 15  Magnesium     Status: None   Collection Time: 11/05/14  4:18 AM  Result Value Ref Range   Magnesium 2.0 1.7 - 2.4 mg/dL  Phosphorus     Status: Abnormal   Collection Time: 11/05/14  4:18 AM  Result Value Ref Range   Phosphorus 2.2 (L) 2.5 - 4.6 mg/dL  Glucose, capillary     Status: Abnormal   Collection Time: 11/05/14  7:49 AM  Result Value Ref Range   Glucose-Capillary 153 (H) 65 - 99 mg/dL   Comment 1 Notify RN    Comment 2 Document in Chart   Glucose, capillary     Status: Abnormal   Collection Time: 11/05/14 12:11 PM  Result Value Ref Range   Glucose-Capillary 170 (H) 65 - 99 mg/dL   Comment 1 Notify RN    Comment 2 Document in Chart   Glucose, capillary     Status: Abnormal   Collection Time: 11/05/14  4:04 PM  Result Value Ref Range   Glucose-Capillary 142 (H) 65 - 99 mg/dL   Comment 1 Notify RN    Comment 2 Document in Chart   Glucose,  capillary     Status: Abnormal   Collection Time: 11/05/14  8:27 PM  Result Value Ref Range   Glucose-Capillary 177 (H) 65 - 99 mg/dL   Comment 1 Notify RN   Glucose, capillary     Status: Abnormal   Collection Time: 11/06/14  7:10 AM  Result Value Ref Range   Glucose-Capillary 159 (H) 65 - 99 mg/dL  Glucose, capillary     Status: Abnormal   Collection Time: 11/06/14 11:24 AM  Result Value Ref Range   Glucose-Capillary 224 (H) 65 - 99 mg/dL  Glucose, capillary     Status: Abnormal   Collection Time: 11/06/14  4:11 PM  Result Value Ref Range   Glucose-Capillary 127 (H) 65 - 99 mg/dL  Glucose, capillary     Status: Abnormal   Collection Time: 11/06/14  8:27 PM  Result Value Ref Range   Glucose-Capillary 217 (H) 65 - 99 mg/dL   Comment 1 Notify RN   Glucose, capillary     Status: Abnormal   Collection Time: 11/07/14  7:34 AM  Result Value Ref Range   Glucose-Capillary 172 (H) 65 - 99 mg/dL  Glucose, capillary     Status: Abnormal   Collection Time: 11/07/14 11:33 AM  Result Value Ref Range   Glucose-Capillary 158 (H) 65 - 99 mg/dL  Glucose, capillary     Status: Abnormal   Collection Time: 11/07/14  4:19 PM  Result Value Ref Range   Glucose-Capillary 152 (H) 65 - 99 mg/dL  Glucose, capillary     Status: Abnormal   Collection Time: 11/07/14  8:05 PM  Result Value Ref Range   Glucose-Capillary 171 (H) 65 - 99 mg/dL  Creatinine, serum     Status: None   Collection Time: 11/08/14  4:57 AM  Result Value Ref Range   Creatinine, Ser 1.05 0.61 - 1.24 mg/dL   GFR calc non Af Amer >60 >60 mL/min   GFR calc Af Amer >60 >60 mL/min    Comment: (NOTE) The eGFR has been calculated using the CKD EPI equation. This calculation has not been validated in all clinical situations. eGFR's persistently <60 mL/min signify possible Chronic Kidney Disease.   Glucose, capillary     Status: Abnormal   Collection Time: 11/08/14  7:17 AM  Result Value Ref Range   Glucose-Capillary 156 (H) 65 - 99  mg/dL  Glucose, capillary     Status: Abnormal   Collection Time: 11/08/14 12:35 PM  Result Value Ref Range   Glucose-Capillary 173 (H) 65 - 99 mg/dL     Assessment & Plan   1. Diabetes mellitus due to underlying condition with diabetic polyneuropathy   2. Essential hypertension   3. Peripheral vascular disease   4. Pedal edema  - furosemide (LASIX) 20 MG tablet; Take 1 tablet orally twice a day.  Dispense: 60 tablet; Refill: 6  5. Bronchitis  - azithromycin (ZITHROMAX) 250 MG tablet; Take 2 tabs on day 1, then 1 tab on days 2-5  Dispense: 6 tablet; Refill: 0

## 2014-12-05 NOTE — Patient Instructions (Addendum)
Use Triple antibiotic ointment and a Bandaid over toe ulcers until healed.  Plan to get BMP on return.  Continue all current meds otherwise.

## 2014-12-07 ENCOUNTER — Telehealth: Payer: Self-pay | Admitting: *Deleted

## 2014-12-07 NOTE — Telephone Encounter (Signed)
New PA # [id number] Called [facility] with auth.

## 2014-12-21 ENCOUNTER — Ambulatory Visit: Payer: Medicare HMO | Admitting: Family Medicine

## 2014-12-26 ENCOUNTER — Encounter: Payer: Self-pay | Admitting: Family Medicine

## 2014-12-26 ENCOUNTER — Ambulatory Visit (INDEPENDENT_AMBULATORY_CARE_PROVIDER_SITE_OTHER): Payer: Commercial Managed Care - HMO | Admitting: Family Medicine

## 2014-12-26 VITALS — BP 150/72 | HR 52 | Resp 16 | Ht 71.0 in | Wt 176.0 lb

## 2014-12-26 DIAGNOSIS — I5022 Chronic systolic (congestive) heart failure: Secondary | ICD-10-CM | POA: Diagnosis not present

## 2014-12-26 DIAGNOSIS — R600 Localized edema: Secondary | ICD-10-CM | POA: Diagnosis not present

## 2014-12-26 DIAGNOSIS — Z23 Encounter for immunization: Secondary | ICD-10-CM

## 2014-12-26 DIAGNOSIS — I10 Essential (primary) hypertension: Secondary | ICD-10-CM

## 2014-12-26 DIAGNOSIS — I509 Heart failure, unspecified: Secondary | ICD-10-CM | POA: Insufficient documentation

## 2014-12-26 DIAGNOSIS — S91332A Puncture wound without foreign body, left foot, initial encounter: Secondary | ICD-10-CM

## 2014-12-26 DIAGNOSIS — E119 Type 2 diabetes mellitus without complications: Secondary | ICD-10-CM

## 2014-12-26 MED ORDER — FUROSEMIDE 40 MG PO TABS: ORAL_TABLET | ORAL | Status: DC

## 2014-12-26 NOTE — Progress Notes (Signed)
Name: Randy Howard   MRN: [id number]    DOB: [birth year]   Date:12/26/2014       Progress Note  Subjective  Chief Complaint  Chief Complaint  Patient presents with  . Hypertension    follow up  . Diabetes    follow up    HPI  Here for f/u of DM and HBP.  Still having swelling in legs.  Had recxent vasuclAr surgery. Of legs.  Not taking Gabapentin as directed.   No problem-specific assessment & plan notes found for this encounter.   Past Medical History  Diagnosis Date  . Diabetes type 2, controlled   . Nocturia   . Elevated lipids   . Hypertension   . Prostate enlargement   . Peripheral vascular disease   . Obstructive sleep apnea   . Skin cancer     Followed by Dr. Evorn Gong  . Neuropathy   . Right carotid bruit   . Tremor   . Coronary artery disease   . Skin cancer   . PVD (peripheral vascular disease)     Social History  Substance Use Topics  . Smoking status: Former Smoker    Quit date: 03/24/1978  . Smokeless tobacco: Not on file  . Alcohol Use: No     Current outpatient prescriptions:  .  amLODipine (NORVASC) 5 MG tablet, Take 5 mg by mouth daily., Disp: , Rfl:  .  aspirin 81 MG tablet, Take 81 mg by mouth daily., Disp: , Rfl:  .  baclofen (LIORESAL) 10 MG tablet, Take 10 mg by mouth 3 (three) times daily as needed for muscle spasms., Disp: , Rfl:  .  benazepril (LOTENSIN) 40 MG tablet, Take 40 mg by mouth daily., Disp: , Rfl:  .  clopidogrel (PLAVIX) 75 MG tablet, Take 75 mg by mouth daily., Disp: , Rfl:  .  etodolac (LODINE) 300 MG capsule, Take 300 mg by mouth 2 (two) times daily as needed., Disp: , Rfl:  .  finasteride (PROSCAR) 5 MG tablet, Take 5 mg by mouth daily., Disp: , Rfl:  .  furosemide (LASIX) 20 MG tablet, Take 1 tablet orally twice a day., Disp: 60 tablet, Rfl: 6 .  gabapentin (NEURONTIN) 100 MG capsule, Take 100 mg by mouth 3 (three) times daily., Disp: , Rfl:  .  glimepiride (AMARYL) 4 MG tablet, Take 2 mg by mouth daily with breakfast.,  Disp: , Rfl:  .  lovastatin (MEVACOR) 40 MG tablet, Take 1 tablet (40 mg total) by mouth at bedtime., Disp: 90 tablet, Rfl: 0 .  meclizine (ANTIVERT) 25 MG tablet, Take 1 tablet (25 mg total) by mouth 3 (three) times daily as needed for dizziness., Disp: 60 tablet, Rfl: 6 .  metFORMIN (GLUCOPHAGE) 1000 MG tablet, Take 500 mg by mouth 2 (two) times daily with a meal. , Disp: , Rfl:  .  metoprolol (LOPRESSOR) 50 MG tablet, Take 50 mg by mouth 2 (two) times daily., Disp: , Rfl:  .  tamsulosin (FLOMAX) 0.4 MG CAPS capsule, Take 1 capsule by mouth daily., Disp: , Rfl:  .  azithromycin (ZITHROMAX) 250 MG tablet, Take 2 tabs on day 1, then 1 tab on days 2-5 (Patient not taking: Reported on 12/26/2014), Disp: 6 tablet, Rfl: 0  Allergies  Allergen Reactions  . Oxycodone Nausea Only    Review of Systems  Constitutional: Positive for malaise/fatigue. Negative for fever, chills and weight loss.  Eyes: Negative for blurred vision and double vision.  Respiratory: Positive for shortness of  breath. Negative for cough, sputum production and wheezing.   Cardiovascular: Positive for PND. Negative for chest pain, palpitations, orthopnea and leg swelling.  Gastrointestinal: Negative for heartburn, abdominal pain and blood in stool.  Genitourinary: Negative for dysuria, urgency and frequency.  Skin: Negative for rash.  Neurological: Negative for dizziness, tremors, sensory change, focal weakness, weakness and headaches.      Objective  Filed Vitals:   12/26/14 1306  BP: 150/72  Pulse: 52  Resp: 16  Height: _0  (1.803 m)  Weight: 176 lb (79.833 kg)     Physical Exam  Constitutional: He is oriented to person, place, and time and well-developed, well-nourished, and in no distress. No distress.  HENT:  Head: Normocephalic and atraumatic.  Eyes: Conjunctivae and EOM are normal. Pupils are equal, round, and reactive to light. No scleral icterus.  Neck: Normal range of motion. Neck supple. Carotid  bruit is not present. No thyromegaly present.  Cardiovascular: Normal rate, regular rhythm, normal heart sounds and intact distal pulses.  Exam reveals no gallop and no friction rub.   No murmur heard. Pulmonary/Chest: Effort normal and breath sounds normal. No respiratory distress. He has no wheezes. He has no rales.  Abdominal: Soft. Bowel sounds are normal. He exhibits no distension and no abdominal bruit. There is no tenderness. There is no rebound.  Musculoskeletal: He exhibits edema (2+ edema of both feet. lower legs.).  Diabetic foot exam- Decreased vibratory sense in both feet.  Also, decreased pin prick in both toes.  Position sense is ok.  Skin is ok except for a healing puncture wound of ball of L. Foot.  No cellulitis  Lymphadenopathy:    He has no cervical adenopathy.  Neurological: He is alert and oriented to person, place, and time.  Vitals reviewed.     Recent Results (from the past 2160 hour(s))  POCT A1C     Status: None   Collection Time: 10/06/14 10:35 AM  Result Value Ref Range   Hemoglobin A1C 6.9   Basic metabolic panel     Status: Abnormal   Collection Time: 10/09/14  7:19 AM  Result Value Ref Range   Sodium 138 135 - 145 mmol/L   Potassium 3.9 3.5 - 5.1 mmol/L   Chloride 101 101 - 111 mmol/L   CO2 30 22 - 32 mmol/L   Glucose, Bld 162 (H) 65 - 99 mg/dL   BUN 22 (H) 6 - 20 mg/dL   Creatinine, Ser 1.31 (H) 0.61 - 1.24 mg/dL   Calcium 9.1 8.9 - 10.3 mg/dL   GFR calc non Af Amer 52 (L) >60 mL/min   GFR calc Af Amer 60 (L) >60 mL/min    Comment: (NOTE) The eGFR has been calculated using the CKD EPI equation. This calculation has not been validated in all clinical situations. eGFR's persistently <60 mL/min signify possible Chronic Kidney Disease.    Anion gap 7 5 - 15  Type and screen for Red Blood Exchange     Status: None   Collection Time: 10/24/14  9:58 AM  Result Value Ref Range   ABO/RH(D) O POS    Antibody Screen NEG    Sample Expiration  11/07/2014   CBC     Status: Abnormal   Collection Time: 10/24/14  9:58 AM  Result Value Ref Range   WBC 7.3 3.8 - 10.6 K/uL   RBC 4.39 (L) 4.40 - 5.90 MIL/uL   Hemoglobin 13.4 13.0 - 18.0 g/dL   HCT 40.0 40.0 - 52.0 %  MCV 91.2 80.0 - 100.0 fL   MCH 30.6 26.0 - 34.0 pg   MCHC 33.5 32.0 - 36.0 g/dL   RDW 13.0 11.5 - 14.5 %   Platelets 139 (L) 150 - 440 K/uL  Protime-INR     Status: Abnormal   Collection Time: 10/24/14  9:58 AM  Result Value Ref Range   Prothrombin Time 15.5 (H) 11.4 - 15.0 seconds   INR 1.21   APTT     Status: None   Collection Time: 10/24/14  9:58 AM  Result Value Ref Range   aPTT 30 24 - 36 seconds  Basic metabolic panel     Status: Abnormal   Collection Time: 10/24/14  9:58 AM  Result Value Ref Range   Sodium 140 135 - 145 mmol/L   Potassium 4.0 3.5 - 5.1 mmol/L   Chloride 103 101 - 111 mmol/L   CO2 30 22 - 32 mmol/L   Glucose, Bld 223 (H) 65 - 99 mg/dL   BUN 26 (H) 6 - 20 mg/dL   Creatinine, Ser 1.02 0.61 - 1.24 mg/dL   Calcium 9.7 8.9 - 10.3 mg/dL   GFR calc non Af Amer >60 >60 mL/min   GFR calc Af Amer >60 >60 mL/min    Comment: (NOTE) The eGFR has been calculated using the CKD EPI equation. This calculation has not been validated in all clinical situations. eGFR's persistently <60 mL/min signify possible Chronic Kidney Disease.    Anion gap 7 5 - 15  ABO/Rh     Status: None   Collection Time: 10/24/14  9:59 AM  Result Value Ref Range   ABO/RH(D) O POS   Glucose, capillary     Status: Abnormal   Collection Time: 11/01/14 10:20 AM  Result Value Ref Range   Glucose-Capillary 162 (H) 65 - 99 mg/dL  Surgical pathology     Status: None   Collection Time: 11/01/14  2:13 PM  Result Value Ref Range   SURGICAL PATHOLOGY      Surgical Pathology CASE: [id number] PATIENT: Maximus Lesniewski Surgical Pathology Report     SPECIMEN SUBMITTED: A. Profunda plaque, left common femoral, superficial femoral B. Profunda plaque, right common femoral,  superficial femoral  CLINICAL HISTORY: None provided  PRE-OPERATIVE DIAGNOSIS: ASO  POST-OPERATIVE DIAGNOSIS: ASO     DIAGNOSIS: A. PROFUNDA PLAQUE, LEFT COMMON FEMORAL, SUPERFICIAL FEMORAL: - CALCIFIED ATHEROMATOUS PLAQUE.  B. PROFUNDA PLAQUE, RIGHT COMMON FEMORAL, SUPERFICIAL FEMORAL: - CALCIFIED ATHEROMATOUS PLAQUE.    GROSS DESCRIPTION:  A. Labeled: left common femoral, superficial femoral and profunda plaque Tissue Fragment(s): multiple Measurement: aggregate, 8.5 x 1.5 x 1.0 cm Comment: calcified yellow to tan fragments, sectioned  Representative submitted in cassette(s): 1 following decalcification  B. Labeled: right common femoral, superficial femoral and profunda plaque Tissue Fragment(s): multiple Measurement: aggregate,  6.2 x 4.5 x 1.1 cm Comment: orange to tan focally calcified fragments, sectioned  Representative submitted in cassette(s): 1 following decalcification     Final Diagnosis performed by Quay Burow, MD.  Electronically signed 11/03/2014 9:02:50AM    The electronic signature indicates that the named Attending Pathologist has evaluated the specimen  Technical component performed at [facility], [address] [place], [place] [address] Lab: [phone] Dir: Darrick Penna. Evette Doffing, MD  Professional component performed at [facility], [facility], [place], [place], [place] [address] Lab: [phone] Dir: Dellia Nims. Rubinas, MD    Glucose, capillary     Status: Abnormal   Collection Time: 11/01/14  4:04 PM  Result Value Ref  Range   Glucose-Capillary 143 (H) 65 - 99 mg/dL  MRSA PCR Screening     Status: None   Collection Time: 11/01/14  6:08 PM  Result Value Ref Range   MRSA by PCR NEGATIVE NEGATIVE    Comment:        The GeneXpert MRSA Assay (FDA approved for NASAL specimens only), is one component of a comprehensive MRSA colonization surveillance program. It is not intended to diagnose MRSA infection nor to  guide or monitor treatment for MRSA infections.   CBC     Status: Abnormal   Collection Time: 11/01/14  7:06 PM  Result Value Ref Range   WBC 17.6 (H) 3.8 - 10.6 K/uL   RBC 4.07 (L) 4.40 - 5.90 MIL/uL   Hemoglobin 12.5 (L) 13.0 - 18.0 g/dL   HCT 38.0 (L) 40.0 - 52.0 %   MCV 93.5 80.0 - 100.0 fL   MCH 30.6 26.0 - 34.0 pg   MCHC 32.8 32.0 - 36.0 g/dL   RDW 13.5 11.5 - 14.5 %   Platelets 132 (L) 150 - 440 K/uL  Creatinine, serum     Status: Abnormal   Collection Time: 11/01/14  7:06 PM  Result Value Ref Range   Creatinine, Ser 1.24 0.61 - 1.24 mg/dL   GFR calc non Af Amer 55 (L) >60 mL/min   GFR calc Af Amer >60 >60 mL/min    Comment: (NOTE) The eGFR has been calculated using the CKD EPI equation. This calculation has not been validated in all clinical situations. eGFR's persistently <60 mL/min signify possible Chronic Kidney Disease.   Glucose, capillary     Status: Abnormal   Collection Time: 11/01/14  9:28 PM  Result Value Ref Range   Glucose-Capillary 244 (H) 65 - 99 mg/dL   Comment 1 Notify RN   CBC     Status: Abnormal   Collection Time: 11/02/14  4:34 AM  Result Value Ref Range   WBC 11.9 (H) 3.8 - 10.6 K/uL   RBC 3.74 (L) 4.40 - 5.90 MIL/uL   Hemoglobin 11.5 (L) 13.0 - 18.0 g/dL   HCT 34.8 (L) 40.0 - 52.0 %   MCV 93.1 80.0 - 100.0 fL   MCH 30.7 26.0 - 34.0 pg   MCHC 33.0 32.0 - 36.0 g/dL   RDW 13.4 11.5 - 14.5 %   Platelets 121 (L) 150 - 440 K/uL  Basic metabolic panel     Status: Abnormal   Collection Time: 11/02/14  4:34 AM  Result Value Ref Range   Sodium 138 135 - 145 mmol/L   Potassium 4.4 3.5 - 5.1 mmol/L   Chloride 107 101 - 111 mmol/L   CO2 21 (L) 22 - 32 mmol/L   Glucose, Bld 231 (H) 65 - 99 mg/dL   BUN 19 6 - 20 mg/dL   Creatinine, Ser 1.16 0.61 - 1.24 mg/dL   Calcium 8.2 (L) 8.9 - 10.3 mg/dL   GFR calc non Af Amer 60 (L) >60 mL/min   GFR calc Af Amer >60 >60 mL/min    Comment: (NOTE) The eGFR has been calculated using the CKD EPI  equation. This calculation has not been validated in all clinical situations. eGFR's persistently <60 mL/min signify possible Chronic Kidney Disease.    Anion gap 10 5 - 15  Glucose, capillary     Status: Abnormal   Collection Time: 11/02/14  7:09 AM  Result Value Ref Range   Glucose-Capillary 194 (H) 65 - 99 mg/dL  Glucose, capillary  Status: Abnormal   Collection Time: 11/02/14 11:30 AM  Result Value Ref Range   Glucose-Capillary 140 (H) 65 - 99 mg/dL  Glucose, capillary     Status: Abnormal   Collection Time: 11/02/14  2:00 PM  Result Value Ref Range   Glucose-Capillary 200 (H) 65 - 99 mg/dL  Glucose, capillary     Status: Abnormal   Collection Time: 11/02/14  4:31 PM  Result Value Ref Range   Glucose-Capillary 159 (H) 65 - 99 mg/dL  Glucose, capillary     Status: Abnormal   Collection Time: 11/02/14  8:22 PM  Result Value Ref Range   Glucose-Capillary 168 (H) 65 - 99 mg/dL  Glucose, capillary     Status: Abnormal   Collection Time: 11/03/14  7:46 AM  Result Value Ref Range   Glucose-Capillary 194 (H) 65 - 99 mg/dL  Glucose, capillary     Status: Abnormal   Collection Time: 11/03/14 11:43 AM  Result Value Ref Range   Glucose-Capillary 144 (H) 65 - 99 mg/dL  Glucose, capillary     Status: Abnormal   Collection Time: 11/03/14  4:40 PM  Result Value Ref Range   Glucose-Capillary 130 (H) 65 - 99 mg/dL  Glucose, capillary     Status: Abnormal   Collection Time: 11/03/14  8:23 PM  Result Value Ref Range   Glucose-Capillary 178 (H) 65 - 99 mg/dL  Glucose, capillary     Status: Abnormal   Collection Time: 11/04/14  7:47 AM  Result Value Ref Range   Glucose-Capillary 149 (H) 65 - 99 mg/dL   Comment 1 Notify RN    Comment 2 Document in Chart   Glucose, capillary     Status: Abnormal   Collection Time: 11/04/14 11:43 AM  Result Value Ref Range   Glucose-Capillary 206 (H) 65 - 99 mg/dL   Comment 1 Notify RN    Comment 2 Document in Chart   Glucose, capillary      Status: Abnormal   Collection Time: 11/04/14  4:53 PM  Result Value Ref Range   Glucose-Capillary 151 (H) 65 - 99 mg/dL   Comment 1 Notify RN    Comment 2 Document in Chart   Glucose, capillary     Status: Abnormal   Collection Time: 11/04/14  8:16 PM  Result Value Ref Range   Glucose-Capillary 169 (H) 65 - 99 mg/dL   Comment 1 Notify RN   CBC     Status: Abnormal   Collection Time: 11/05/14  4:18 AM  Result Value Ref Range   WBC 9.9 3.8 - 10.6 K/uL   RBC 3.56 (L) 4.40 - 5.90 MIL/uL   Hemoglobin 10.9 (L) 13.0 - 18.0 g/dL   HCT 32.5 (L) 40.0 - 52.0 %   MCV 91.3 80.0 - 100.0 fL   MCH 30.7 26.0 - 34.0 pg   MCHC 33.6 32.0 - 36.0 g/dL   RDW 13.2 11.5 - 14.5 %   Platelets 117 (L) 150 - 440 K/uL  Basic metabolic panel     Status: Abnormal   Collection Time: 11/05/14  4:18 AM  Result Value Ref Range   Sodium 138 135 - 145 mmol/L   Potassium 3.8 3.5 - 5.1 mmol/L   Chloride 103 101 - 111 mmol/L   CO2 29 22 - 32 mmol/L   Glucose, Bld 161 (H) 65 - 99 mg/dL   BUN 18 6 - 20 mg/dL   Creatinine, Ser 1.11 0.61 - 1.24 mg/dL   Calcium 8.3 (L) 8.9 -  10.3 mg/dL   GFR calc non Af Amer >60 >60 mL/min   GFR calc Af Amer >60 >60 mL/min    Comment: (NOTE) The eGFR has been calculated using the CKD EPI equation. This calculation has not been validated in all clinical situations. eGFR's persistently <60 mL/min signify possible Chronic Kidney Disease.    Anion gap 6 5 - 15  Magnesium     Status: None   Collection Time: 11/05/14  4:18 AM  Result Value Ref Range   Magnesium 2.0 1.7 - 2.4 mg/dL  Phosphorus     Status: Abnormal   Collection Time: 11/05/14  4:18 AM  Result Value Ref Range   Phosphorus 2.2 (L) 2.5 - 4.6 mg/dL  Glucose, capillary     Status: Abnormal   Collection Time: 11/05/14  7:49 AM  Result Value Ref Range   Glucose-Capillary 153 (H) 65 - 99 mg/dL   Comment 1 Notify RN    Comment 2 Document in Chart   Glucose, capillary     Status: Abnormal   Collection Time: 11/05/14 12:11  PM  Result Value Ref Range   Glucose-Capillary 170 (H) 65 - 99 mg/dL   Comment 1 Notify RN    Comment 2 Document in Chart   Glucose, capillary     Status: Abnormal   Collection Time: 11/05/14  4:04 PM  Result Value Ref Range   Glucose-Capillary 142 (H) 65 - 99 mg/dL   Comment 1 Notify RN    Comment 2 Document in Chart   Glucose, capillary     Status: Abnormal   Collection Time: 11/05/14  8:27 PM  Result Value Ref Range   Glucose-Capillary 177 (H) 65 - 99 mg/dL   Comment 1 Notify RN   Glucose, capillary     Status: Abnormal   Collection Time: 11/06/14  7:10 AM  Result Value Ref Range   Glucose-Capillary 159 (H) 65 - 99 mg/dL  Glucose, capillary     Status: Abnormal   Collection Time: 11/06/14 11:24 AM  Result Value Ref Range   Glucose-Capillary 224 (H) 65 - 99 mg/dL  Glucose, capillary     Status: Abnormal   Collection Time: 11/06/14  4:11 PM  Result Value Ref Range   Glucose-Capillary 127 (H) 65 - 99 mg/dL  Glucose, capillary     Status: Abnormal   Collection Time: 11/06/14  8:27 PM  Result Value Ref Range   Glucose-Capillary 217 (H) 65 - 99 mg/dL   Comment 1 Notify RN   Glucose, capillary     Status: Abnormal   Collection Time: 11/07/14  7:34 AM  Result Value Ref Range   Glucose-Capillary 172 (H) 65 - 99 mg/dL  Glucose, capillary     Status: Abnormal   Collection Time: 11/07/14 11:33 AM  Result Value Ref Range   Glucose-Capillary 158 (H) 65 - 99 mg/dL  Glucose, capillary     Status: Abnormal   Collection Time: 11/07/14  4:19 PM  Result Value Ref Range   Glucose-Capillary 152 (H) 65 - 99 mg/dL  Glucose, capillary     Status: Abnormal   Collection Time: 11/07/14  8:05 PM  Result Value Ref Range   Glucose-Capillary 171 (H) 65 - 99 mg/dL  Creatinine, serum     Status: None   Collection Time: 11/08/14  4:57 AM  Result Value Ref Range   Creatinine, Ser 1.05 0.61 - 1.24 mg/dL   GFR calc non Af Amer >60 >60 mL/min   GFR calc Af Amer >60 >  60 mL/min    Comment:  (NOTE) The eGFR has been calculated using the CKD EPI equation. This calculation has not been validated in all clinical situations. eGFR's persistently <60 mL/min signify possible Chronic Kidney Disease.   Glucose, capillary     Status: Abnormal   Collection Time: 11/08/14  7:17 AM  Result Value Ref Range   Glucose-Capillary 156 (H) 65 - 99 mg/dL  Glucose, capillary     Status: Abnormal   Collection Time: 11/08/14 12:35 PM  Result Value Ref Range   Glucose-Capillary 173 (H) 65 - 99 mg/dL     Assessment & Plan  1. Type 2 diabetes mellitus without complication, unspecified long term insulin use status (Index)  - HM Diabetes Foot Exam  2. Need for influenza vaccination  - Flu vaccine HIGH DOSE PF (Fluzone High dose)  3. Essential hypertension   4. Chronic systolic congestive heart failure (HCC)  - furosemide (LASIX) 40 MG tablet; Take 2 tablets each morning  Dispense: 60 tablet; Refill: 6  5. Puncture wound of foot, left, initial encounter  - Td : Tetanus/diphtheria >7yo Preservative  free  6. Pedal edema  - furosemide (LASIX) 40 MG tablet; Take 2 tablets each morning  Dispense: 60 tablet; Refill: 6

## 2014-12-26 NOTE — Patient Instructions (Signed)
Keep foot wound clean and bandged until healed.

## 2015-01-16 ENCOUNTER — Ambulatory Visit: Payer: Medicare HMO | Admitting: Family Medicine

## 2015-02-09 ENCOUNTER — Other Ambulatory Visit: Payer: Self-pay | Admitting: Family Medicine

## 2015-02-19 ENCOUNTER — Ambulatory Visit: Payer: Medicare HMO | Admitting: Family Medicine

## 2015-02-19 ENCOUNTER — Ambulatory Visit: Payer: Commercial Managed Care - HMO | Admitting: Family Medicine

## 2015-03-01 ENCOUNTER — Other Ambulatory Visit: Payer: Self-pay | Admitting: Family Medicine

## 2015-03-03 ENCOUNTER — Other Ambulatory Visit: Payer: Self-pay | Admitting: Family Medicine

## 2015-03-07 ENCOUNTER — Ambulatory Visit (INDEPENDENT_AMBULATORY_CARE_PROVIDER_SITE_OTHER): Payer: Commercial Managed Care - HMO | Admitting: Family Medicine

## 2015-03-07 ENCOUNTER — Encounter: Payer: Self-pay | Admitting: Family Medicine

## 2015-03-07 VITALS — BP 131/69 | HR 60 | Temp 97.9°F | Resp 16 | Ht 71.0 in | Wt 175.0 lb

## 2015-03-07 DIAGNOSIS — I10 Essential (primary) hypertension: Secondary | ICD-10-CM | POA: Diagnosis not present

## 2015-03-07 DIAGNOSIS — I70213 Atherosclerosis of native arteries of extremities with intermittent claudication, bilateral legs: Secondary | ICD-10-CM | POA: Diagnosis not present

## 2015-03-07 DIAGNOSIS — I5022 Chronic systolic (congestive) heart failure: Secondary | ICD-10-CM

## 2015-03-07 DIAGNOSIS — E119 Type 2 diabetes mellitus without complications: Secondary | ICD-10-CM | POA: Diagnosis not present

## 2015-03-07 DIAGNOSIS — E7800 Pure hypercholesterolemia, unspecified: Secondary | ICD-10-CM | POA: Diagnosis not present

## 2015-03-07 DIAGNOSIS — F419 Anxiety disorder, unspecified: Secondary | ICD-10-CM | POA: Insufficient documentation

## 2015-03-07 DIAGNOSIS — R011 Cardiac murmur, unspecified: Secondary | ICD-10-CM

## 2015-03-07 DIAGNOSIS — R600 Localized edema: Secondary | ICD-10-CM

## 2015-03-07 DIAGNOSIS — I739 Peripheral vascular disease, unspecified: Secondary | ICD-10-CM | POA: Diagnosis not present

## 2015-03-07 DIAGNOSIS — I2510 Atherosclerotic heart disease of native coronary artery without angina pectoris: Secondary | ICD-10-CM

## 2015-03-07 DIAGNOSIS — R0602 Shortness of breath: Secondary | ICD-10-CM | POA: Diagnosis not present

## 2015-03-07 LAB — POCT GLYCOSYLATED HEMOGLOBIN (HGB A1C)

## 2015-03-07 MED ORDER — BUSPIRONE HCL 5 MG PO TABS: 5.0000 mg | ORAL_TABLET | Freq: Two times a day (BID) | ORAL | Status: DC

## 2015-03-07 MED ORDER — FUROSEMIDE 40 MG PO TABS: ORAL_TABLET | ORAL | Status: DC

## 2015-03-07 NOTE — Patient Instructions (Signed)
Keep appts with Dr. Clayborn Bigness and Dr. Lucky Cowboy as directed.

## 2015-03-07 NOTE — Progress Notes (Signed)
Name: Randy Howard   MRN: [id number]    DOB: April 03, 1940   Date:03/07/2015       Progress Note  Subjective  Chief Complaint  Chief Complaint  Patient presents with  . Diabetes    HPI Here for f/u of DM.  He does not take meds regularly.  Takes Metformin 1000 mg. Daily and a 500 in the evening about 1/2 the time.  He says he feels shaky if he takes 1000 mg bid.    Reports feeling "nervous" at times.  Cannot be more specific.  Says has taken some Xanax in distant past.  Says he doesn't sleep well.  Trouble going to sleep and staying asleep.  Patient reports having a Pneumovax-23 about 10 years ago, after turning 18.  Patient declines colonoscopy at this time .  Will discuss again next year.  No problem-specific assessment & plan notes found for this encounter.   Past Medical History  Diagnosis Date  . Diabetes type 2, controlled ([place])   . Nocturia   . Elevated lipids   . Hypertension   . Prostate enlargement   . Peripheral vascular disease ([place])   . Obstructive sleep apnea   . Skin cancer     Followed by Dr. Evorn Gong  . Neuropathy ([place])   . Right carotid bruit   . Tremor   . Coronary artery disease   . Skin cancer   . PVD (peripheral vascular disease) [facility])     Past Surgical History  Procedure Laterality Date  . Coronary artery bypass graft  2006  . Carotid artery angioplasty Left 09/30/2011  . Cardiac catheterization    . Peripheral vascular catheterization Left 10/09/2014    Procedure: Lower Extremity Angiography;  Surgeon: Algernon Huxley, MD;  Location: [place] [facility];  Service: Cardiovascular;  Laterality: Left;  . Peripheral vascular catheterization  10/09/2014    Procedure: Lower Extremity Intervention;  Surgeon: Algernon Huxley, MD;  Location: [place] [facility];  Service: Cardiovascular;;  . Endarterectomy femoral Bilateral 11/01/2014    Procedure: ENDARTERECTOMY FEMORAL;  Surgeon: Algernon Huxley, MD;  Location: [facility];  Service: Vascular;  Laterality:  Bilateral;    Family History  Problem Relation Age of Onset  . Heart disease Sister   . Heart disease Sister   . Diabetes Sister   . Diabetes Sister   . Diabetes Brother     Social History   Social History  . Marital Status: Widowed    Spouse Name: N/A  . Number of Children: N/A  . Years of Education: N/A   Occupational History  . Not on file.   Social History Main Topics  . Smoking status: Former Smoker    Quit date: 03/24/1978  . Smokeless tobacco: Not on file  . Alcohol Use: No  . Drug Use: No  . Sexual Activity: Not on file   Other Topics Concern  . Not on file   Social History Narrative     Current outpatient prescriptions:  .  amLODipine (NORVASC) 10 MG tablet, TAKE 1 TABLET BY MOUTH ONCE DAILY FOR HIGH BLOOD PRESSURE., Disp: 90 tablet, Rfl: 3 .  aspirin 81 MG tablet, Take 81 mg by mouth daily., Disp: , Rfl:  .  baclofen (LIORESAL) 10 MG tablet, TAKE 1 TABLET BY MOUTH 3 TIMES DAILY AS NEEDED., Disp: 60 each, Rfl: 6 .  benazepril (LOTENSIN) 40 MG tablet, TAKE 1 TABLET BY MOUTH ONCE DAILY., Disp: 90 tablet, Rfl: 3 .  clopidogrel (PLAVIX) 75  MG tablet, TAKE 1 TABLET BY MOUTH ONCE DAILY., Disp: 90 tablet, Rfl: 3 .  etodolac (LODINE) 300 MG capsule, Take 300 mg by mouth 2 (two) times daily as needed., Disp: , Rfl:  .  finasteride (PROSCAR) 5 MG tablet, TAKE 1 TABLET BY MOUTH ONCE DAILY., Disp: 90 tablet, Rfl: 3 .  furosemide (LASIX) 40 MG tablet, Take 2 tablets twice a day, in AM and early afternoon., Disp: 120 tablet, Rfl: 6 .  gabapentin (NEURONTIN) 100 MG capsule, Take 100 mg by mouth 3 (three) times daily., Disp: , Rfl:  .  glimepiride (AMARYL) 4 MG tablet, Take 2 mg by mouth daily with breakfast., Disp: , Rfl:  .  meclizine (ANTIVERT) 25 MG tablet, Take 1 tablet (25 mg total) by mouth 3 (three) times daily as needed for dizziness., Disp: 60 tablet, Rfl: 6 .  metFORMIN (GLUCOPHAGE) 1000 MG tablet, Take 1,000 mg by mouth daily with breakfast. Take 1 tablet in AM  and 1/2 tablet in evening., Disp: , Rfl:  .  metoprolol (LOPRESSOR) 50 MG tablet, Take 50 mg by mouth 2 (two) times daily., Disp: , Rfl:  .  tamsulosin (FLOMAX) 0.4 MG CAPS capsule, Take 1 capsule by mouth daily., Disp: , Rfl:  .  busPIRone (BUSPAR) 5 MG tablet, Take 1 tablet (5 mg total) by mouth 2 (two) times daily., Disp: 60 tablet, Rfl: 3 .  lovastatin (MEVACOR) 40 MG tablet, Take 40 mg by mouth at bedtime. Reported on 03/07/2015, Disp: 90 tablet, Rfl: 0  Allergies  Allergen Reactions  . Oxycodone Nausea Only     Review of Systems  Constitutional: Negative for fever, chills, weight loss and malaise/fatigue.  HENT: Negative for hearing loss.   Eyes: Negative for blurred vision and double vision.  Respiratory: Negative for cough, shortness of breath and wheezing.   Cardiovascular: Positive for leg swelling. Negative for chest pain and palpitations.  Gastrointestinal: Positive for heartburn. Negative for abdominal pain and blood in stool.  Genitourinary: Negative for dysuria, urgency and frequency.  Skin: Negative for rash.  Neurological: Negative for weakness and headaches.  Psychiatric/Behavioral: The patient is nervous/anxious.        Objective  Filed Vitals:   03/07/15 1324 03/07/15 1404  BP: 131/69   Pulse: 52 60  Temp: 97.9 F (36.6 C)   TempSrc: Oral   Resp: 16   Height: 5\' 11"  (1.803 m)   Weight: 175 lb (79.379 kg)     Physical Exam  Constitutional: He is oriented to person, place, and time and well-developed, well-nourished, and in no distress. No distress.  HENT:  Head: Normocephalic and atraumatic.  Eyes: Conjunctivae and EOM are normal. Pupils are equal, round, and reactive to light. No scleral icterus.  Neck: Normal range of motion. Neck supple. Carotid bruit is present. No thyromegaly present.  Cardiovascular: Normal rate, regular rhythm and normal heart sounds.  Exam reveals no gallop and no friction rub.   No murmur heard. Pulmonary/Chest: Effort  normal and breath sounds normal. No respiratory distress. He has no wheezes. He has no rales.  Abdominal: Soft. Bowel sounds are normal. He exhibits no distension and no mass. There is no tenderness.  Musculoskeletal: He exhibits edema (1+ pedal edema to knees).  Lymphadenopathy:    He has no cervical adenopathy.  Neurological: He is alert and oriented to person, place, and time.  Vitals reviewed.       Recent Results (from the past 2160 hour(s))  POCT HgB A1C     Status:  Abnormal   Collection Time: 03/07/15  1:39 PM  Result Value Ref Range   Hemoglobin A1C 7.2%      Assessment & Plan  Problem List Items Addressed This Visit      Cardiovascular and Mediastinum   Hypertension   Relevant Medications   furosemide (LASIX) 40 MG tablet   Peripheral vascular disease (HCC)   Relevant Medications   furosemide (LASIX) 40 MG tablet   Atherosclerosis of native artery of both lower extremities with intermittent claudication (HCC)   Relevant Medications   furosemide (LASIX) 40 MG tablet   Arteriosclerosis of coronary artery   Relevant Medications   furosemide (LASIX) 40 MG tablet   CHF (congestive heart failure) (HCC)   Relevant Medications   furosemide (LASIX) 40 MG tablet   Other Relevant Orders   Basic Metabolic Panel (BMET)     Endocrine   Diabetes ([place]) - Primary   Relevant Orders   POCT HgB A1C (Completed)   Basic Metabolic Panel (BMET)     Other   Elevated cholesterol   Relevant Medications   furosemide (LASIX) 40 MG tablet   Pedal edema   Relevant Medications   furosemide (LASIX) 40 MG tablet   Other Relevant Orders   Basic Metabolic Panel (BMET)   Cardiac murmur   Breath shortness   Anxiety disorder   Relevant Medications   busPIRone (BUSPAR) 5 MG tablet      Meds ordered this encounter  Medications  . furosemide (LASIX) 40 MG tablet    Sig: Take 2 tablets twice a day, in AM and early afternoon.    Dispense:  120 tablet    Refill:  6  . busPIRone  (BUSPAR) 5 MG tablet    Sig: Take 1 tablet (5 mg total) by mouth 2 (two) times daily.    Dispense:  60 tablet    Refill:  3   1. Type 2 diabetes mellitus without complication, without long-term current use of insulin (HCC)  - POCT HgB [id number] - Basic Metabolic Panel (BMET) -Take Metformin 1000 mg in AM and 1/21 tablet every evening at supper. 2. Pedal edema  - furosemide (LASIX) 40 MG tablet; Take 2 tablets twice a day, in AM and early afternoon.  Dispense: 120 tablet; Refill: 6 - Basic Metabolic Panel (BMET)  3. Chronic systolic congestive heart failure (HCC)  - furosemide (LASIX) 40 MG tablet; Take 2 tablets twice a day, in AM and early afternoon.  Dispense: 120 tablet; Refill: 6 - Basic Metabolic Panel (BMET)  4. Essential hypertension   5. Peripheral vascular disease ([place])   6. Anxiety disorder, unspecified anxiety disorder type  - busPIRone (BUSPAR) 5 MG tablet; Take 1 tablet (5 mg total) by mouth 2 (two) times daily.  Dispense: 60 tablet; Refill: 3  7. Atherosclerosis of native artery of both lower extremities with intermittent claudication ([place])   8. Arteriosclerosis of coronary artery   9. Elevated cholesterol   10. Cardiac murmur   11. Breath shortness

## 2015-03-14 ENCOUNTER — Other Ambulatory Visit: Payer: Self-pay | Admitting: Family Medicine

## 2015-04-27 ENCOUNTER — Telehealth: Payer: Self-pay | Admitting: Family Medicine

## 2015-04-27 LAB — HM DIABETES EYE EXAM

## 2015-04-27 NOTE — Telephone Encounter (Signed)
Approved for 6 month or 6 visits starting today 04/27/2015.  Approval # [id number]

## 2015-04-27 NOTE — Telephone Encounter (Signed)
Pt called states that he have appt.today at  [facility] at 10:15 he need a referral  DX: [id number] Dr. Sandra Cockayne NPI [id number]

## 2015-05-01 ENCOUNTER — Telehealth: Payer: Self-pay | Admitting: Family Medicine

## 2015-05-01 NOTE — Telephone Encounter (Signed)
Approval is done 05/06/15 till 11/02/15 with approval number [id number] Randy Howard.

## 2015-05-01 NOTE — Telephone Encounter (Signed)
Pt need a referral   Dr. Milus Height  DX: [id number] NPI: [id number] Appt Feb 13th @1 :00

## 2015-05-14 ENCOUNTER — Ambulatory Visit: Payer: Commercial Managed Care - HMO | Admitting: Family Medicine

## 2015-05-14 LAB — HM DIABETES EYE EXAM

## 2015-05-17 ENCOUNTER — Encounter: Payer: Self-pay | Admitting: Family Medicine

## 2015-05-25 ENCOUNTER — Other Ambulatory Visit: Payer: Self-pay | Admitting: Family Medicine

## 2015-05-26 ENCOUNTER — Other Ambulatory Visit: Payer: Self-pay | Admitting: Family Medicine

## 2015-05-29 ENCOUNTER — Other Ambulatory Visit: Payer: Self-pay | Admitting: Family Medicine

## 2015-05-29 MED ORDER — TAMSULOSIN HCL 0.4 MG PO CAPS: 0.4000 mg | ORAL_CAPSULE | Freq: Every day | ORAL | Status: DC

## 2015-06-04 ENCOUNTER — Ambulatory Visit (INDEPENDENT_AMBULATORY_CARE_PROVIDER_SITE_OTHER): Payer: Commercial Managed Care - HMO | Admitting: Family Medicine

## 2015-06-04 ENCOUNTER — Encounter: Payer: Self-pay | Admitting: Family Medicine

## 2015-06-04 VITALS — BP 140/63 | HR 60 | Resp 16 | Ht 71.0 in | Wt 178.0 lb

## 2015-06-04 DIAGNOSIS — K4090 Unilateral inguinal hernia, without obstruction or gangrene, not specified as recurrent: Secondary | ICD-10-CM | POA: Insufficient documentation

## 2015-06-04 DIAGNOSIS — I739 Peripheral vascular disease, unspecified: Secondary | ICD-10-CM

## 2015-06-04 DIAGNOSIS — K4091 Unilateral inguinal hernia, without obstruction or gangrene, recurrent: Secondary | ICD-10-CM

## 2015-06-04 DIAGNOSIS — E119 Type 2 diabetes mellitus without complications: Secondary | ICD-10-CM | POA: Diagnosis not present

## 2015-06-04 DIAGNOSIS — R251 Tremor, unspecified: Secondary | ICD-10-CM | POA: Diagnosis not present

## 2015-06-04 DIAGNOSIS — I10 Essential (primary) hypertension: Secondary | ICD-10-CM | POA: Diagnosis not present

## 2015-06-04 LAB — GLUCOSE, POCT (MANUAL RESULT ENTRY): POC Glucose: 165 mg/dl — AB (ref 70–99)

## 2015-06-04 NOTE — Progress Notes (Signed)
Name: Randy Howard   MRN: [id number]    DOB: May 09, 1940   Date:06/04/2015       Progress Note  Subjective  Chief Complaint  Chief Complaint  Patient presents with  . Diabetes    03/07/2015  7.2  will skip A1c today due to timing and insurance. Patient has lost 2 toenails in past month. Patient is not checking BS and can not work his machine.     HPI Here for f/u of DM.  He is 1 day too early for A1c and cannot come back later this week or go to lab later this week.  He does not check BSs at home.  Has seen [facility] re: his retinal changes from DM.   Getting injections.  Also has f/u appt with Dr. Lucky Cowboy for peripheral vascular disease in near future.    No problem-specific assessment & plan notes found for this encounter.   Past Medical History  Diagnosis Date  . Diabetes type 2, controlled ([place])   . Nocturia   . Elevated lipids   . Hypertension   . Prostate enlargement   . Peripheral vascular disease ([place])   . Obstructive sleep apnea   . Skin cancer     Followed by Dr. Evorn Gong  . Neuropathy ([place])   . Right carotid bruit   . Tremor   . Coronary artery disease   . Skin cancer   . PVD (peripheral vascular disease) [facility])     Past Surgical History  Procedure Laterality Date  . Coronary artery bypass graft  2006  . Carotid artery angioplasty Left 09/30/2011  . Cardiac catheterization    . Peripheral vascular catheterization Left 10/09/2014    Procedure: Lower Extremity Angiography;  Surgeon: Algernon Huxley, MD;  Location: [place] [facility];  Service: Cardiovascular;  Laterality: Left;  . Peripheral vascular catheterization  10/09/2014    Procedure: Lower Extremity Intervention;  Surgeon: Algernon Huxley, MD;  Location: [place] [facility];  Service: Cardiovascular;;  . Endarterectomy femoral Bilateral 11/01/2014    Procedure: ENDARTERECTOMY FEMORAL;  Surgeon: Algernon Huxley, MD;  Location: [facility];  Service: Vascular;  Laterality: Bilateral;    Family History  Problem  Relation Age of Onset  . Heart disease Sister   . Heart disease Sister   . Diabetes Sister   . Diabetes Sister   . Diabetes Brother     Social History   Social History  . Marital Status: Widowed    Spouse Name: N/A  . Number of Children: N/A  . Years of Education: N/A   Occupational History  . Not on file.   Social History Main Topics  . Smoking status: Former Smoker    Quit date: 03/24/1978  . Smokeless tobacco: Not on file  . Alcohol Use: No  . Drug Use: No  . Sexual Activity: Not on file   Other Topics Concern  . Not on file   Social History Narrative     Current outpatient prescriptions:  .  amLODipine (NORVASC) 10 MG tablet, TAKE 1 TABLET BY MOUTH ONCE DAILY FOR HIGH BLOOD PRESSURE., Disp: 90 tablet, Rfl: 3 .  aspirin 81 MG tablet, Take 81 mg by mouth daily., Disp: , Rfl:  .  baclofen (LIORESAL) 10 MG tablet, TAKE 1 TABLET BY MOUTH 3 TIMES DAILY AS NEEDED., Disp: 60 each, Rfl: 6 .  benazepril (LOTENSIN) 40 MG tablet, TAKE 1 TABLET BY MOUTH ONCE DAILY., Disp: 90 tablet, Rfl: 3 .  busPIRone (  BUSPAR) 5 MG tablet, Take 1 tablet (5 mg total) by mouth 2 (two) times daily., Disp: 60 tablet, Rfl: 3 .  clopidogrel (PLAVIX) 75 MG tablet, TAKE 1 TABLET BY MOUTH ONCE DAILY., Disp: 90 tablet, Rfl: 3 .  etodolac (LODINE) 300 MG capsule, TAKE 1 CAPSULE BY MOUTH TWICE DAILY AS NEEDED., Disp: 60 capsule, Rfl: 1 .  finasteride (PROSCAR) 5 MG tablet, TAKE 1 TABLET BY MOUTH ONCE DAILY., Disp: 90 tablet, Rfl: 3 .  furosemide (LASIX) 40 MG tablet, Take 2 tablets twice a day, in AM and early afternoon., Disp: 120 tablet, Rfl: 6 .  gabapentin (NEURONTIN) 100 MG capsule, Take 100 mg by mouth 3 (three) times daily., Disp: , Rfl:  .  glimepiride (AMARYL) 4 MG tablet, Take 2 mg by mouth daily with breakfast., Disp: , Rfl:  .  meclizine (ANTIVERT) 25 MG tablet, Take 1 tablet (25 mg total) by mouth 3 (three) times daily as needed for dizziness., Disp: 60 tablet, Rfl: 6 .  metFORMIN (GLUCOPHAGE)  1000 MG tablet, Take 1,000 mg by mouth daily with breakfast. Take 1 tablet in AM and 1/2 tablet in evening., Disp: , Rfl:  .  metoprolol (LOPRESSOR) 50 MG tablet, TAKE 1 TABLET BY MOUTH TWICE DAILY, Disp: 180 tablet, Rfl: 3 .  tamsulosin (FLOMAX) 0.4 MG CAPS capsule, Take 1 capsule (0.4 mg total) by mouth daily., Disp: 30 capsule, Rfl: 12  Allergies  Allergen Reactions  . Oxycodone Nausea Only     Review of Systems  Constitutional: Negative for fever, chills, weight loss and malaise/fatigue.  HENT: Positive for congestion. Negative for hearing loss.   Eyes: Positive for blurred vision. Negative for double vision.  Respiratory: Negative for cough, shortness of breath and wheezing.   Cardiovascular: Negative for chest pain, palpitations and leg swelling.  Gastrointestinal: Negative for heartburn, abdominal pain and blood in stool.  Genitourinary: Negative for dysuria, urgency and frequency.  Musculoskeletal: Negative for myalgias and joint pain.  Skin: Negative for rash.  Neurological: Negative for dizziness, tremors, weakness and headaches.      Objective  Filed Vitals:   06/04/15 1340 06/04/15 1406  BP: 140/63   Pulse: 51 60  Resp: 16   Height: 5\' 11"  (1.803 m)   Weight: 178 lb (80.74 kg)   SpO2: 100%     Physical Exam  Constitutional: He is oriented to person, place, and time and well-developed, well-nourished, and in no distress. No distress.  HENT:  Head: Normocephalic and atraumatic.  Eyes: Conjunctivae and EOM are normal. Pupils are equal, round, and reactive to light. No scleral icterus.  Neck: Normal range of motion. Neck supple. Carotid bruit is not present. No thyromegaly present.  Cardiovascular: Normal rate, regular rhythm and normal heart sounds.   Pulmonary/Chest: Effort normal and breath sounds normal. No respiratory distress. He has no wheezes. He has no rales.  Genitourinary:  R inguinal hernia.  Musculoskeletal: He exhibits edema (2+ ptting edema of R  leg;  Trace edema of L leg.).  Lymphadenopathy:    He has no cervical adenopathy.  Neurological: He is alert and oriented to person, place, and time.  Tremor of head noted and intention tremor of hands noted.  Vitals reviewed.      Recent Results (from the past 2160 hour(s))  POCT HgB A1C     Status: Abnormal   Collection Time: 03/07/15  1:39 PM  Result Value Ref Range   Hemoglobin A1C 7.2%   HM DIABETES EYE EXAM  Status: Abnormal   Collection Time: 04/27/15 12:00 AM  Result Value Ref Range   HM Diabetic Eye Exam Retinopathy (A) No Retinopathy    Comment: Referred to Retina Specialist by [facility]  HM DIABETES EYE EXAM     Status: Abnormal   Collection Time: 05/14/15 12:00 AM  Result Value Ref Range   HM Diabetic Eye Exam Retinopathy (A) No Retinopathy    Comment: severe nonprolferative retinopathy rt/proliferative retinopa  POCT Glucose (CBG)     Status: Abnormal   Collection Time: 06/04/15  1:59 PM  Result Value Ref Range   POC Glucose 165 (A) 70 - 99 mg/dl     Assessment & Plan  Problem List Items Addressed This Visit      Cardiovascular and Mediastinum   Hypertension   Peripheral vascular disease ([place])     Endocrine   Diabetes ([place]) - Primary   Relevant Orders   POCT Glucose (CBG) (Completed)     Other   Tremor   Relevant Orders   Ambulatory referral to Neurology   Inguinal hernia   Relevant Orders   Ambulatory referral to General Surgery      No orders of the defined types were placed in this encounter.   1. Type 2 diabetes mellitus without complication, without long-term current use of insulin (HCC) Cont. meds - POCT Glucose (CBG)-165  2. Essential hypertension Cont. meds  3. Peripheral vascular disease (HCC) See Dr. Lucky Cowboy.  4. Tremor  - Ambulatory referral to Neurology  5. Unilateral recurrent inguinal hernia without obstruction or gangrene  - Ambulatory referral to General Surgery

## 2015-06-12 ENCOUNTER — Telehealth: Payer: Self-pay | Admitting: *Deleted

## 2015-06-12 ENCOUNTER — Encounter: Payer: Self-pay | Admitting: *Deleted

## 2015-06-12 NOTE — Telephone Encounter (Signed)
Called patient to let him know appt scheduled with Dr. Jamal Collin. Appt scheduled 06/21/15 at 10:30. Patient states he will call office back as he has another appt on 06/20/15. Dr.Sankar office number is [phone] patient may call and reschedule.

## 2015-06-21 ENCOUNTER — Ambulatory Visit: Payer: Self-pay | Admitting: General Surgery

## 2015-06-21 ENCOUNTER — Telehealth: Payer: Self-pay | Admitting: Family Medicine

## 2015-06-21 NOTE — Telephone Encounter (Signed)
Megan at [facility] needs a [facility] authorization for pt's May 8th appt.  He will be seeing Dr. Melrose Nakayama for tremor.  Her call back number is [phone]

## 2015-06-21 NOTE — Telephone Encounter (Signed)
Done Randy Howard entered and faxed to [facility].[place]

## 2015-07-23 ENCOUNTER — Ambulatory Visit (INDEPENDENT_AMBULATORY_CARE_PROVIDER_SITE_OTHER): Payer: Commercial Managed Care - HMO | Admitting: General Surgery

## 2015-07-23 ENCOUNTER — Encounter: Payer: Self-pay | Admitting: General Surgery

## 2015-07-23 VITALS — BP 120/78 | HR 74 | Resp 12 | Ht 71.5 in | Wt 175.0 lb

## 2015-07-23 DIAGNOSIS — K4090 Unilateral inguinal hernia, without obstruction or gangrene, not specified as recurrent: Secondary | ICD-10-CM

## 2015-07-23 NOTE — Patient Instructions (Addendum)
Hernia, Adult A hernia is the bulging of an organ or tissue through a weak spot in the muscles of the abdomen (abdominal wall). Hernias develop most often near the navel or groin. There are many kinds of hernias. Common kinds include:  Femoral hernia. This kind of hernia develops under the groin in the upper thigh area.  Inguinal hernia. This kind of hernia develops in the groin or scrotum.  Umbilical hernia. This kind of hernia develops near the navel.  Hiatal hernia. This kind of hernia causes part of the stomach to be pushed up into the chest.  Incisional hernia. This kind of hernia bulges through a scar from an abdominal surgery. CAUSES This condition may be caused by:  Heavy lifting.  Coughing over a long period of time.  Straining to have a bowel movement.  An incision made during an abdominal surgery.  A birth defect (congenital defect).  Excess weight or obesity.  Smoking.  Poor nutrition.  Cystic fibrosis.  Excess fluid in the abdomen.  Undescended testicles. SYMPTOMS Symptoms of a hernia include:  A lump on the abdomen. This is the first sign of a hernia. The lump may become more obvious with standing, straining, or coughing. It may get bigger over time if it is not treated or if the condition causing it is not treated.  Pain. A hernia is usually painless, but it may become painful over time if treatment is delayed. The pain is usually dull and may get worse with standing or lifting heavy objects. Sometimes a hernia gets tightly squeezed in the weak spot (strangulated) or stuck there (incarcerated) and causes additional symptoms. These symptoms may include:  Vomiting.  Nausea.  Constipation.  Irritability. DIAGNOSIS A hernia may be diagnosed with:  A physical exam. During the exam your health care provider may ask you to cough or to make a specific movement, because a hernia is usually more visible when you move.  Imaging tests. These can  include:  X-rays.  Ultrasound.  CT scan. TREATMENT A hernia that is small and painless may not need to be treated. A hernia that is large or painful may be treated with surgery. Inguinal hernias may be treated with surgery to prevent incarceration or strangulation. Strangulated hernias are always treated with surgery, because lack of blood to the trapped organ or tissue can cause it to die. Surgery to treat a hernia involves pushing the bulge back into place and repairing the weak part of the abdomen. HOME CARE INSTRUCTIONS  Avoid straining.  Do not lift anything heavier than 10 lb (4.5 kg).  Lift with your leg muscles, not your back muscles. This helps avoid strain.  When coughing, try to cough gently.  Prevent constipation. Constipation leads to straining with bowel movements, which can make a hernia worse or cause a hernia repair to break down. You can prevent constipation by:  Eating a high-fiber diet that includes plenty of fruits and vegetables.  Drinking enough fluids to keep your urine clear or pale yellow. Aim to drink 6-8 glasses of water per day.  Using a stool softener as directed by your health care provider.  Lose weight, if you are overweight.  Do not use any tobacco products, including cigarettes, chewing tobacco, or electronic cigarettes. If you need help quitting, ask your health care provider.  Keep all follow-up visits as directed by your health care provider. This is important. Your health care provider may need to monitor your condition. SEEK MEDICAL CARE IF:  You have   swelling, redness, and pain in the affected area.  Your bowel habits change. SEEK IMMEDIATE MEDICAL CARE IF:  You have a fever.  You have abdominal pain that is getting worse.  You feel nauseous or you vomit.  You cannot push the hernia back in place by gently pressing on it while you are lying down.  The hernia:  Changes in shape or size.  Is stuck outside the  abdomen.  Becomes discolored.  Feels hard or tender.   This information is not intended to replace advice given to you by your health care provider. Make sure you discuss any questions you have with your health care provider.   Document Released: 03/10/2005 Document Revised: 03/31/2014 Document Reviewed: 01/18/2014 Elsevier Interactive Patient Education 2016 [employer].  Patient has been scheduled for a CT abdomen/pelvis without contrast (oral contrast only) at 08-08-15 at [facility] for 3:30 pm (arrive 3:15 pm). Prep: no solids 4 hours prior but patient may have clear liquids up until exam time and pick up prep kit. Patient verbalizes understanding.

## 2015-07-23 NOTE — Progress Notes (Signed)
Patient ID: Randy Howard, male   DOB: 06/12/1940, 75 y.o.   MRN: [id number]  Chief Complaint  Patient presents with  . OTHER    hernia    HPI Randy Howard is a 75 y.o. male here for evaluation of a right inguinal hernia. He states that the herniation has been there "since the 70s." Patient has occasional discomfort with the herniation but denies sharp pain. Patient reports having had surgery on his arteries in both groins in August of last year. He is not clear on details. I have reviewed the history of present illness with the patient. HPI  Past Medical History  Diagnosis Date  . Diabetes type 2, controlled ([place])   . Nocturia   . Elevated lipids   . Hypertension   . Prostate enlargement   . Peripheral vascular disease ([place])   . Obstructive sleep apnea   . Skin cancer     Followed by Dr. Evorn Gong  . Neuropathy ([place])   . Right carotid bruit   . Tremor   . Coronary artery disease   . Skin cancer   . PVD (peripheral vascular disease) ([place])   . Hyperlipidemia   . Arthritis     Past Surgical History  Procedure Laterality Date  . Coronary artery bypass graft  2006  . Carotid artery angioplasty Left 09/30/2011  . Cardiac catheterization    . Peripheral vascular catheterization Left 10/09/2014    Procedure: Lower Extremity Angiography;  Surgeon: Algernon Huxley, MD;  Location: [facility];  Service: Cardiovascular;  Laterality: Left;  . Peripheral vascular catheterization  10/09/2014    Procedure: Lower Extremity Intervention;  Surgeon: Algernon Huxley, MD;  Location: [facility];  Service: Cardiovascular;;  . Endarterectomy femoral Bilateral 11/01/2014    Procedure: ENDARTERECTOMY FEMORAL;  Surgeon: Algernon Huxley, MD;  Location: [facility];  Service: Vascular;  Laterality: Bilateral;    Family History  Problem Relation Age of Onset  . Heart disease Sister   . Heart disease Sister   . Diabetes Sister   . Diabetes Sister   . Diabetes Brother     Social  History Social History  Substance Use Topics  . Smoking status: Former Smoker -- 25 years    Quit date: 03/24/1985  . Smokeless tobacco: Never Used  . Alcohol Use: No    Allergies  Allergen Reactions  . Oxycodone Nausea Only    Current Outpatient Prescriptions  Medication Sig Dispense Refill  . amLODipine (NORVASC) 10 MG tablet TAKE 1 TABLET BY MOUTH ONCE DAILY FOR HIGH BLOOD PRESSURE. 90 tablet 3  . aspirin 81 MG tablet Take 81 mg by mouth daily.    . baclofen (LIORESAL) 10 MG tablet TAKE 1 TABLET BY MOUTH 3 TIMES DAILY AS NEEDED. 60 each 6  . benazepril (LOTENSIN) 40 MG tablet TAKE 1 TABLET BY MOUTH ONCE DAILY. 90 tablet 3  . busPIRone (BUSPAR) 5 MG tablet Take 1 tablet (5 mg total) by mouth 2 (two) times daily. 60 tablet 3  . clopidogrel (PLAVIX) 75 MG tablet TAKE 1 TABLET BY MOUTH ONCE DAILY. 90 tablet 3  . etodolac (LODINE) 300 MG capsule TAKE 1 CAPSULE BY MOUTH TWICE DAILY AS NEEDED. 60 capsule 1  . finasteride (PROSCAR) 5 MG tablet TAKE 1 TABLET BY MOUTH ONCE DAILY. 90 tablet 3  . furosemide (LASIX) 40 MG tablet Take 2 tablets twice a day, in AM and early afternoon. 120 tablet 6  . gabapentin (NEURONTIN) 100 MG capsule  Take 100 mg by mouth 3 (three) times daily.    Marland Kitchen glimepiride (AMARYL) 4 MG tablet Take 2 mg by mouth daily with breakfast.    . meclizine (ANTIVERT) 25 MG tablet Take 1 tablet (25 mg total) by mouth 3 (three) times daily as needed for dizziness. 60 tablet 6  . metFORMIN (GLUCOPHAGE) 1000 MG tablet Take 1,000 mg by mouth daily with breakfast. Take 1 tablet in AM and 1/2 tablet in evening.    . metoprolol (LOPRESSOR) 50 MG tablet TAKE 1 TABLET BY MOUTH TWICE DAILY 180 tablet 3  . tamsulosin (FLOMAX) 0.4 MG CAPS capsule Take 1 capsule (0.4 mg total) by mouth daily. 30 capsule 12   No current facility-administered medications for this visit.    Review of Systems Review of Systems  Constitutional: Negative.   Respiratory: Negative.   Cardiovascular: Negative.    Gastrointestinal: Negative.     Blood pressure 120/78, pulse 74, resp. rate 12, height 5' 11.5" (1.816 m), weight 175 lb (79.379 kg).  Physical Exam Physical Exam  Constitutional: He is oriented to person, place, and time. He appears well-developed and well-nourished.  Eyes: Conjunctivae are normal. No scleral icterus.  Neck: Neck supple.  Cardiovascular: Normal rate, regular rhythm and normal heart sounds.   Pulses:      Femoral pulses are 2+ on the right side, and 2+ on the left side. There is edema in both legs. Right side notably worse.  Pulmonary/Chest: Effort normal and breath sounds normal.  Abdominal: Soft. Bowel sounds are normal. A hernia is present. Hernia confirmed positive in the right inguinal area.    Musculoskeletal:       Right shoulder: Swelling: lower extremities.  Lymphadenopathy:    He has no cervical adenopathy.  Neurological: He is alert and oriented to person, place, and time.  Skin: Skin is warm and dry.    Data Reviewed Op note from his most recent vascular surgery.  Assessment    Patient has a large, reducable right inguinal hernia. Patient states that the hernia was present for years with minimal, if any, symptoms  Patient obviously has significant cardiovascular disease and is on Plavix. It would be prudent to discuss with both his cardiologist and vascular surgeon. Also, a CT may help with the anatomic details of this hernia. Plan    Patient will have a CT of the abdomen and pelvis. Patient to follow up after CT results. Will consult Dr. Lucky Cowboy regarding his femoral artery surgery. Also, with his cardiologist regarding cardiac status. Patient has been scheduled for a CT abdomen/pelvis without contrast (oral contrast only) at 08-08-15 at [facility] for 3:30 pm (arrive 3:15 pm). Prep: no solids 4 hours prior but patient may have clear liquids up until exam time and pick up prep kit. Patient verbalizes understanding.      PCP: Larene Beach This has been scribed by Lesly Rubenstein LPN    Christene Lye 07/23/2015, 3:13 PM

## 2015-07-30 DIAGNOSIS — G250 Essential tremor: Secondary | ICD-10-CM | POA: Insufficient documentation

## 2015-08-08 ENCOUNTER — Ambulatory Visit
Admission: RE | Admit: 2015-08-08 | Discharge: 2015-08-08 | Disposition: A | Discharge status: Home / Self Care | Payer: Commercial Managed Care - HMO | Source: Ambulatory Visit | Attending: General Surgery | Admitting: General Surgery

## 2015-08-08 DIAGNOSIS — K4090 Unilateral inguinal hernia, without obstruction or gangrene, not specified as recurrent: Secondary | ICD-10-CM

## 2015-08-08 DIAGNOSIS — K4020 Bilateral inguinal hernia, without obstruction or gangrene, not specified as recurrent: Secondary | ICD-10-CM | POA: Diagnosis not present

## 2015-08-09 ENCOUNTER — Telehealth: Payer: Self-pay | Admitting: *Deleted

## 2015-08-09 NOTE — Telephone Encounter (Signed)
-----   Message from Christene Lye, MD sent at 08/09/2015  8:38 AM EDT ----- Need to see pt to discuss findings

## 2015-08-09 NOTE — Telephone Encounter (Signed)
Patient notified as instructed and verbalizes understanding.   This patient has been scheduled for an appointment on 09-10-15 at 2 pm. He requested to wait till convenient due to his schedule.

## 2015-08-13 ENCOUNTER — Ambulatory Visit: Payer: Commercial Managed Care - HMO | Admitting: Family Medicine

## 2015-08-26 ENCOUNTER — Other Ambulatory Visit: Payer: Self-pay | Admitting: Family Medicine

## 2015-08-28 ENCOUNTER — Ambulatory Visit: Payer: Commercial Managed Care - HMO | Admitting: Family Medicine

## 2015-09-03 ENCOUNTER — Encounter: Payer: Self-pay | Admitting: *Deleted

## 2015-09-07 ENCOUNTER — Encounter: Payer: Self-pay | Admitting: Family Medicine

## 2015-09-10 ENCOUNTER — Ambulatory Visit: Payer: Commercial Managed Care - HMO | Admitting: General Surgery

## 2015-09-11 ENCOUNTER — Telehealth: Payer: Self-pay | Admitting: Family Medicine

## 2015-09-11 MED ORDER — TAMSULOSIN HCL 0.4 MG PO CAPS: 0.4000 mg | ORAL_CAPSULE | Freq: Every day | ORAL | Status: DC

## 2015-09-11 MED ORDER — CLOPIDOGREL BISULFATE 75 MG PO TABS: 75.0000 mg | ORAL_TABLET | Freq: Every day | ORAL | Status: DC

## 2015-09-11 NOTE — Telephone Encounter (Signed)
A refill was sent in for pt's tamsulosin but it was only 30 pills.  He usually gets a 90 day supply.  He also needs a 90 day refill on clopidogrel sent to [facility].  His call back number is [phone]

## 2015-09-11 NOTE — Telephone Encounter (Signed)
rx corrected and resubmitted.[place]

## 2015-09-27 ENCOUNTER — Encounter: Payer: Self-pay | Admitting: *Deleted

## 2015-10-01 ENCOUNTER — Telehealth: Payer: Self-pay

## 2015-10-01 ENCOUNTER — Encounter: Payer: Self-pay | Admitting: Family Medicine

## 2015-10-01 ENCOUNTER — Ambulatory Visit (INDEPENDENT_AMBULATORY_CARE_PROVIDER_SITE_OTHER): Payer: Commercial Managed Care - HMO | Admitting: Family Medicine

## 2015-10-01 VITALS — BP 133/62 | HR 57 | Temp 97.4°F | Resp 20 | Ht 72.0 in | Wt 179.0 lb

## 2015-10-01 DIAGNOSIS — E119 Type 2 diabetes mellitus without complications: Secondary | ICD-10-CM

## 2015-10-01 DIAGNOSIS — R600 Localized edema: Secondary | ICD-10-CM | POA: Diagnosis not present

## 2015-10-01 DIAGNOSIS — I10 Essential (primary) hypertension: Secondary | ICD-10-CM

## 2015-10-01 DIAGNOSIS — I70213 Atherosclerosis of native arteries of extremities with intermittent claudication, bilateral legs: Secondary | ICD-10-CM | POA: Diagnosis not present

## 2015-10-01 DIAGNOSIS — I2510 Atherosclerotic heart disease of native coronary artery without angina pectoris: Secondary | ICD-10-CM | POA: Diagnosis not present

## 2015-10-01 LAB — POCT GLYCOSYLATED HEMOGLOBIN (HGB A1C): Hemoglobin A1C: 6.7

## 2015-10-01 MED ORDER — METOLAZONE 2.5 MG PO TABS: 2.5000 mg | ORAL_TABLET | Freq: Every day | ORAL | Status: DC

## 2015-10-01 MED ORDER — METFORMIN HCL 1000 MG PO TABS: ORAL_TABLET | ORAL | Status: DC

## 2015-10-01 NOTE — Patient Instructions (Signed)
Plan CMP, CBC and Lipid panel on return

## 2015-10-01 NOTE — Telephone Encounter (Signed)
Randy Howard from [facility] is requesting a new prescription for Metformin. Per Pt prescription was suppose to be for 500 mg tablets. Patient is also requesting a 90 day prescription for Buspirone to be sent into [facility]. sd

## 2015-10-01 NOTE — Telephone Encounter (Signed)
Done.-jh 

## 2015-10-01 NOTE — Progress Notes (Signed)
Name: Randy Howard   MRN: [id number]    DOB: 1940/06/08   Date:10/01/2015       Progress Note  Subjective  Chief Complaint  Chief Complaint  Patient presents with  . Diabetes  . Hypertension  . Leg Swelling    HPI  Here for f/u of DM and HBP.  C/o bilateral pedal edema getting worse.  Taking 80 mg of Lasix twice a day.   No problem-specific assessment & plan notes found for this encounter.   Past Medical History  Diagnosis Date  . Diabetes type 2, controlled ([place])   . Nocturia   . Elevated lipids   . Hypertension   . Prostate enlargement   . Peripheral vascular disease ([place])   . Obstructive sleep apnea   . Skin cancer     Followed by Dr. Evorn Gong  . Neuropathy ([place])   . Right carotid bruit   . Tremor   . Coronary artery disease   . Skin cancer   . PVD (peripheral vascular disease) ([place])   . Hyperlipidemia   . Arthritis     Past Surgical History  Procedure Laterality Date  . Coronary artery bypass graft  2006  . Carotid artery angioplasty Left 09/30/2011  . Cardiac catheterization    . Peripheral vascular catheterization Left 10/09/2014    Procedure: Lower Extremity Angiography;  Surgeon: Algernon Huxley, MD;  Location: [facility];  Service: Cardiovascular;  Laterality: Left;  . Peripheral vascular catheterization  10/09/2014    Procedure: Lower Extremity Intervention;  Surgeon: Algernon Huxley, MD;  Location: [facility];  Service: Cardiovascular;;  . Endarterectomy femoral Bilateral 11/01/2014    Procedure: ENDARTERECTOMY FEMORAL;  Surgeon: Algernon Huxley, MD;  Location: [facility];  Service: Vascular;  Laterality: Bilateral;    Family History  Problem Relation Age of Onset  . Heart disease Sister   . Heart disease Sister   . Diabetes Sister   . Diabetes Sister   . Diabetes Brother     Social History   Social History  . Marital Status: Widowed    Spouse Name: N/A  . Number of Children: N/A  . Years of Education: N/A   Occupational History  .  Not on file.   Social History Main Topics  . Smoking status: Former Smoker -- 25 years    Quit date: 03/24/1985  . Smokeless tobacco: Never Used  . Alcohol Use: No  . Drug Use: No  . Sexual Activity: Not on file   Other Topics Concern  . Not on file   Social History Narrative     Current outpatient prescriptions:  .  amLODipine (NORVASC) 10 MG tablet, TAKE 1 TABLET BY MOUTH ONCE DAILY FOR HIGH BLOOD PRESSURE., Disp: 90 tablet, Rfl: 3 .  aspirin 81 MG tablet, Take 81 mg by mouth daily., Disp: , Rfl:  .  baclofen (LIORESAL) 10 MG tablet, TAKE 1 TABLET BY MOUTH 3 TIMES DAILY AS NEEDED., Disp: 60 each, Rfl: 6 .  benazepril (LOTENSIN) 40 MG tablet, TAKE 1 TABLET BY MOUTH ONCE DAILY., Disp: 90 tablet, Rfl: 3 .  busPIRone (BUSPAR) 5 MG tablet, Take 1 tablet (5 mg total) by mouth 2 (two) times daily., Disp: 60 tablet, Rfl: 3 .  clopidogrel (PLAVIX) 75 MG tablet, Take 1 tablet (75 mg total) by mouth daily., Disp: 90 tablet, Rfl: 3 .  etodolac (LODINE) 300 MG capsule, TAKE 1 CAPSULE BY MOUTH TWICE DAILY AS NEEDED., Disp: 60 capsule, Rfl: 1 .  finasteride (PROSCAR) 5 MG tablet, TAKE 1 TABLET BY MOUTH ONCE DAILY., Disp: 90 tablet, Rfl: 3 .  furosemide (LASIX) 40 MG tablet, Take 2 tablets twice a day, in AM and early afternoon., Disp: 120 tablet, Rfl: 6 .  gabapentin (NEURONTIN) 100 MG capsule, Take 100 mg by mouth 3 (three) times daily., Disp: , Rfl:  .  glimepiride (AMARYL) 4 MG tablet, TAKE 1 TABLET BY MOUTH ONCE DAILY, Disp: 90 tablet, Rfl: 3 .  meclizine (ANTIVERT) 25 MG tablet, Take 1 tablet (25 mg total) by mouth 3 (three) times daily as needed for dizziness., Disp: 60 tablet, Rfl: 6 .  metFORMIN (GLUCOPHAGE) 1000 MG tablet, Take 2 tablets twice a day with a meal, Disp: 360 tablet, Rfl: 3 .  metoprolol (LOPRESSOR) 50 MG tablet, TAKE 1 TABLET BY MOUTH TWICE DAILY, Disp: 180 tablet, Rfl: 3 .  tamsulosin (FLOMAX) 0.4 MG CAPS capsule, Take 1 capsule (0.4 mg total) by mouth daily., Disp: 90  capsule, Rfl: 3 .  metolazone (ZAROXOLYN) 2.5 MG tablet, Take 1 tablet (2.5 mg total) by mouth daily., Disp: 90 tablet, Rfl: 3  Allergies  Allergen Reactions  . Oxycodone Nausea Only     Review of Systems  Constitutional: Negative for fever, chills, weight loss and malaise/fatigue.  HENT: Negative for hearing loss.   Eyes: Negative for blurred vision and double vision.  Respiratory: Negative for cough, shortness of breath and wheezing.   Cardiovascular: Positive for leg swelling. Negative for chest pain and palpitations.  Gastrointestinal: Negative for heartburn, abdominal pain and blood in stool.  Genitourinary: Negative for dysuria, urgency and frequency.  Skin: Negative for rash.  Neurological: Negative for dizziness, tremors, weakness and headaches.      Objective  Filed Vitals:   10/01/15 1327  BP: 133/62  Pulse: 57  Temp: 97.4 F (36.3 C)  TempSrc: Oral  Resp: 20  Height: 6' (1.829 m)  Weight: 179 lb (81.194 kg)    Physical Exam  Constitutional: He is oriented to person, place, and time and well-developed, well-nourished, and in no distress. No distress.  HENT:  Head: Normocephalic and atraumatic.  Eyes: Conjunctivae and EOM are normal. Pupils are equal, round, and reactive to light. No scleral icterus.  Neck: Normal range of motion. Neck supple. Carotid bruit is not present. No thyromegaly present.  Cardiovascular: Normal rate, regular rhythm and normal heart sounds.  Exam reveals no gallop and no friction rub.   No murmur heard. Pulmonary/Chest: Effort normal and breath sounds normal. No respiratory distress. He has no wheezes. He has no rales.  Abdominal: Soft. Bowel sounds are normal.  Musculoskeletal: He exhibits edema (2+ pitting edema of both legs to knees.).  Lymphadenopathy:    He has no cervical adenopathy.  Neurological: He is alert and oriented to person, place, and time.  Vitals reviewed.      Recent Results (from the past 2160 hour(s))   POCT glycosylated hemoglobin (Hb A1C)     Status: None   Collection Time: 10/01/15  1:40 PM  Result Value Ref Range   Hemoglobin A1C 6.7      Assessment & Plan  Problem List Items Addressed This Visit      Cardiovascular and Mediastinum   Hypertension   Relevant Medications   metolazone (ZAROXOLYN) 2.5 MG tablet   Atherosclerosis of native artery of both lower extremities with intermittent claudication (HCC)   Relevant Medications   metolazone (ZAROXOLYN) 2.5 MG tablet   Arteriosclerosis of coronary artery   Relevant Medications  metolazone (ZAROXOLYN) 2.5 MG tablet     Endocrine   Diabetes mellitus ([place]) - Primary   Relevant Medications   metFORMIN (GLUCOPHAGE) 1000 MG tablet   Other Relevant Orders   POCT glycosylated hemoglobin (Hb A1C) (Completed)     Other   Pedal edema   Relevant Medications   metolazone (ZAROXOLYN) 2.5 MG tablet      Meds ordered this encounter  Medications  . metolazone (ZAROXOLYN) 2.5 MG tablet    Sig: Take 1 tablet (2.5 mg total) by mouth daily.    Dispense:  90 tablet    Refill:  3  . metFORMIN (GLUCOPHAGE) 1000 MG tablet    Sig: Take 2 tablets twice a day with a meal    Dispense:  360 tablet    Refill:  3   .1. Type 2 diabetes mellitus without complication, without long-term current use of insulin (HCC)  - POCT glycosylated hemoglobin (Hb A1C)-6.7 - metFORMIN (GLUCOPHAGE) 1000 MG tablet; Take 2 tablets twice a day with a meal  Dispense: 360 tablet; Refill: 3 Cont Amaryl 2. Essential hypertension Cont Amlodipine, Metoprolol, Benazopril  3. Arteriosclerosis of coronary artery Cont Plavix  4. Atherosclerosis of native artery of both lower extremities with intermittent claudication ([place])   5. Pedal edema Cont Lasix. - metolazone (ZAROXOLYN) 2.5 MG tablet; Take 1 tablet (2.5 mg total) by mouth daily.  Dispense: 90 tablet; Refill: 3

## 2015-10-03 ENCOUNTER — Encounter: Payer: Self-pay | Admitting: General Surgery

## 2015-10-03 ENCOUNTER — Telehealth: Payer: Self-pay | Admitting: Family Medicine

## 2015-10-03 ENCOUNTER — Ambulatory Visit (INDEPENDENT_AMBULATORY_CARE_PROVIDER_SITE_OTHER): Payer: Commercial Managed Care - HMO | Admitting: General Surgery

## 2015-10-03 VITALS — BP 134/78 | HR 60 | Resp 12 | Ht 72.0 in | Wt 178.0 lb

## 2015-10-03 DIAGNOSIS — K4090 Unilateral inguinal hernia, without obstruction or gangrene, not specified as recurrent: Secondary | ICD-10-CM | POA: Diagnosis not present

## 2015-10-03 NOTE — Progress Notes (Signed)
Patient ID: Randy Howard, male   DOB: Aug 03, 1940, 75 y.o.   MRN: [id number]  Chief Complaint  Patient presents with  . Follow-up    CT    HPI Randy Howard is a 75 y.o. male here today for his follow up ct scan done on 08/08/15. He has bilateral inguinal hernias. I have reviewed the history of present illness with the patient.  HPI  Past Medical History  Diagnosis Date  . Diabetes type 2, controlled ([place])   . Nocturia   . Elevated lipids   . Hypertension   . Prostate enlargement   . Peripheral vascular disease ([place])   . Obstructive sleep apnea   . Skin cancer     Followed by Dr. Evorn Gong  . Neuropathy ([place])   . Right carotid bruit   . Tremor   . Coronary artery disease   . Skin cancer   . PVD (peripheral vascular disease) ([place])   . Hyperlipidemia   . Arthritis     Past Surgical History  Procedure Laterality Date  . Coronary artery bypass graft  2006  . Carotid artery angioplasty Left 09/30/2011  . Cardiac catheterization    . Peripheral vascular catheterization Left 10/09/2014    Procedure: Lower Extremity Angiography;  Surgeon: Algernon Huxley, MD;  Location: [facility];  Service: Cardiovascular;  Laterality: Left;  . Peripheral vascular catheterization  10/09/2014    Procedure: Lower Extremity Intervention;  Surgeon: Algernon Huxley, MD;  Location: [place];  Service: Cardiovascular;;  . Endarterectomy femoral Bilateral 11/01/2014    Procedure: ENDARTERECTOMY FEMORAL;  Surgeon: Algernon Huxley, MD;  Location: [facility];  Service: Vascular;  Laterality: Bilateral;    Family History  Problem Relation Age of Onset  . Heart disease Sister   . Heart disease Sister   . Diabetes Sister   . Diabetes Sister   . Diabetes Brother     Social History Social History  Substance Use Topics  . Smoking status: Former Smoker -- 25 years    Quit date: 03/24/1985  . Smokeless tobacco: Never Used  . Alcohol Use: No    Allergies  Allergen Reactions  . Oxycodone  Nausea Only    Current Outpatient Prescriptions  Medication Sig Dispense Refill  . amLODipine (NORVASC) 10 MG tablet TAKE 1 TABLET BY MOUTH ONCE DAILY FOR HIGH BLOOD PRESSURE. 90 tablet 3  . aspirin 81 MG tablet Take 81 mg by mouth daily.    . baclofen (LIORESAL) 10 MG tablet TAKE 1 TABLET BY MOUTH 3 TIMES DAILY AS NEEDED. 60 each 6  . benazepril (LOTENSIN) 40 MG tablet TAKE 1 TABLET BY MOUTH ONCE DAILY. 90 tablet 3  . busPIRone (BUSPAR) 5 MG tablet Take 1 tablet (5 mg total) by mouth 2 (two) times daily. 60 tablet 3  . clopidogrel (PLAVIX) 75 MG tablet Take 1 tablet (75 mg total) by mouth daily. 90 tablet 3  . etodolac (LODINE) 300 MG capsule TAKE 1 CAPSULE BY MOUTH TWICE DAILY AS NEEDED. 60 capsule 1  . finasteride (PROSCAR) 5 MG tablet TAKE 1 TABLET BY MOUTH ONCE DAILY. 90 tablet 3  . furosemide (LASIX) 40 MG tablet Take 2 tablets twice a day, in AM and early afternoon. 120 tablet 6  . gabapentin (NEURONTIN) 100 MG capsule Take 100 mg by mouth 3 (three) times daily.    Marland Kitchen glimepiride (AMARYL) 4 MG tablet TAKE 1 TABLET BY MOUTH ONCE DAILY 90 tablet 3  . meclizine (ANTIVERT) 25  MG tablet Take 1 tablet (25 mg total) by mouth 3 (three) times daily as needed for dizziness. 60 tablet 6  . metFORMIN (GLUCOPHAGE) 1000 MG tablet Take 2 tablets twice a day with a meal 360 tablet 3  . metolazone (ZAROXOLYN) 2.5 MG tablet Take 1 tablet (2.5 mg total) by mouth daily. 90 tablet 3  . metoprolol (LOPRESSOR) 50 MG tablet TAKE 1 TABLET BY MOUTH TWICE DAILY 180 tablet 3  . tamsulosin (FLOMAX) 0.4 MG CAPS capsule Take 1 capsule (0.4 mg total) by mouth daily. 90 capsule 3   No current facility-administered medications for this visit.    Review of Systems Review of Systems  Constitutional: Negative.   Respiratory: Negative.   Cardiovascular: Negative.   Gastrointestinal: Negative.     Blood pressure 134/78, pulse 60, resp. rate 12, height 6' (1.829 m), weight 178 lb (80.74 kg).  Physical  Exam Physical Exam  Constitutional: He is oriented to person, place, and time. He appears well-developed and well-nourished.  Eyes: Conjunctivae are normal. No scleral icterus.  Neck: Neck supple.  Cardiovascular: Normal rate, regular rhythm and normal heart sounds.   Moderate edema in both legs  Pulmonary/Chest: Effort normal and breath sounds normal.  Abdominal: Soft. Normal appearance and bowel sounds are normal. There is no hepatomegaly. There is no tenderness. A hernia is present. Hernia confirmed positive in the right inguinal area and confirmed positive in the left inguinal area.  Neurological: He is alert and oriented to person, place, and time.  Skin: Skin is warm and dry.  Bilateral inguinal hernias, right larger than left  Data Reviewed Ct  Scan reviewed There is a large right inguinal hernia containing sizeable portion of omentum. Smaller left inguinal hernia containing some bowel. Both hernias appear to be likely direct type. Assessment    Bilateral inguinal hernia     Plan  Hernia precautions and incarceration were discussed with the patient. If they develop symptoms of an incarcerated hernia, they were encouraged to seek prompt medical attention.  I have recommended repair of the hernia using mesh on an outpatient basis in the near future. The risk of infection was reviewed. The role of prosthetic mesh to minimize the risk of recurrence was reviewed.  Patient to be scheduled for bilateral inguinal hernia if he decided to have it done This patient is scheduled to see Dr. Clayborn Bigness at [facility] for cardiac clearance on Monday, 10-15-15 at 2:15 pm. He will need to obtain clearance prior to scheduling hernia repair.       PCP:  Philbert Riser, Nikki Dom This information has been scribed by Gaspar Cola CMA.   Sanari Offner G 10/04/2015, 3:09 PM

## 2015-10-03 NOTE — Patient Instructions (Signed)

## 2015-10-03 NOTE — Telephone Encounter (Signed)
[facility] surgical associate Selinda Eon called regarding pt. As per her Dr. Jamal Collin referred  pt to be seen by cardiology for  dx K40.90 and approval is done # [id number] valid from 10/03/15 till 03/31/16.

## 2015-10-04 ENCOUNTER — Encounter: Payer: Self-pay | Admitting: General Surgery

## 2015-10-17 ENCOUNTER — Telehealth: Payer: Self-pay

## 2015-10-17 NOTE — Telephone Encounter (Signed)
Patient called to say that he had cancelled his cardiac clearance appointment with Dr Clayborn Bigness. He states that he does not want to have hernia surgery. He was instructed to contact us in the future if he changed his mind. The patient is amendable to this.

## 2015-10-18 ENCOUNTER — Other Ambulatory Visit: Payer: Self-pay | Admitting: *Deleted

## 2015-10-18 ENCOUNTER — Telehealth: Payer: Self-pay | Admitting: Family Medicine

## 2015-10-18 MED ORDER — NITROGLYCERIN 0.4 MG SL SUBL: 0.4000 mg | SUBLINGUAL_TABLET | SUBLINGUAL | 3 refills | Status: AC | PRN

## 2015-10-18 NOTE — Telephone Encounter (Signed)
done

## 2015-11-05 ENCOUNTER — Encounter: Payer: Self-pay | Admitting: Family Medicine

## 2015-11-05 ENCOUNTER — Ambulatory Visit
Admission: RE | Admit: 2015-11-05 | Discharge: 2015-11-05 | Disposition: A | Discharge status: Home / Self Care | Payer: Commercial Managed Care - HMO | Source: Ambulatory Visit | Attending: Family Medicine | Admitting: Family Medicine

## 2015-11-05 ENCOUNTER — Other Ambulatory Visit: Payer: Self-pay | Admitting: *Deleted

## 2015-11-05 ENCOUNTER — Ambulatory Visit (INDEPENDENT_AMBULATORY_CARE_PROVIDER_SITE_OTHER): Payer: Commercial Managed Care - HMO | Admitting: Family Medicine

## 2015-11-05 VITALS — BP 119/67 | HR 68 | Temp 97.6°F | Resp 16 | Ht 70.0 in | Wt 157.0 lb

## 2015-11-05 DIAGNOSIS — R0602 Shortness of breath: Secondary | ICD-10-CM | POA: Insufficient documentation

## 2015-11-05 DIAGNOSIS — M533 Sacrococcygeal disorders, not elsewhere classified: Secondary | ICD-10-CM

## 2015-11-05 DIAGNOSIS — I739 Peripheral vascular disease, unspecified: Secondary | ICD-10-CM | POA: Diagnosis not present

## 2015-11-05 DIAGNOSIS — I5020 Unspecified systolic (congestive) heart failure: Secondary | ICD-10-CM

## 2015-11-05 DIAGNOSIS — R634 Abnormal weight loss: Secondary | ICD-10-CM | POA: Diagnosis not present

## 2015-11-05 DIAGNOSIS — E0800 Diabetes mellitus due to underlying condition with hyperosmolarity without nonketotic hyperglycemic-hyperosmolar coma (NKHHC): Secondary | ICD-10-CM | POA: Diagnosis not present

## 2015-11-05 DIAGNOSIS — Z951 Presence of aortocoronary bypass graft: Secondary | ICD-10-CM | POA: Insufficient documentation

## 2015-11-05 DIAGNOSIS — I70213 Atherosclerosis of native arteries of extremities with intermittent claudication, bilateral legs: Secondary | ICD-10-CM | POA: Diagnosis not present

## 2015-11-05 DIAGNOSIS — R6881 Early satiety: Secondary | ICD-10-CM | POA: Diagnosis not present

## 2015-11-05 DIAGNOSIS — R600 Localized edema: Secondary | ICD-10-CM | POA: Diagnosis not present

## 2015-11-05 DIAGNOSIS — I10 Essential (primary) hypertension: Secondary | ICD-10-CM

## 2015-11-05 DIAGNOSIS — I700 Atherosclerosis of aorta: Secondary | ICD-10-CM | POA: Insufficient documentation

## 2015-11-05 DIAGNOSIS — R42 Dizziness and giddiness: Secondary | ICD-10-CM

## 2015-11-05 NOTE — Progress Notes (Signed)
Name: Randy Howard   MRN: [id number]    DOB: Oct 15, 1940   Date:11/05/2015       Progress Note  Subjective  Chief Complaint  Chief Complaint  Patient presents with  . Leg Pain  . Back Pain    shoulder blade rt side  . Anorexia  . Diabetes  . Hypertension  . Edema    HPI Here for f/u of pedal edema.  Legs have gone down inb size, but still swollen at ankles.  Legs still numb.  Sees Dr. Melrose Nakayama about this.  BSs at home not checked.  He has not eaten well for several months.  Very early satiety.  Starts eating and stops because full right away.  He also feels weak and lightheaded on arising with loqw blood pressure.  HE c/o chest congestion.  Has some cough.  Has thick sputum.  Fell 2 weeks ago and injured tailbone.  Still very sore.  Feels that there is something under R eyelid.  No problem-specific Assessment & Plan notes found for this encounter.   Past Medical History:  Diagnosis Date  . Arthritis   . Coronary artery disease   . Diabetes type 2, controlled ([place])   . Elevated lipids   . Hyperlipidemia   . Hypertension   . Neuropathy ([place])   . Nocturia   . Obstructive sleep apnea   . Peripheral vascular disease ([place])   . Prostate enlargement   . PVD (peripheral vascular disease) ([place])   . Right carotid bruit   . Skin cancer    Followed by Dr. Evorn Gong  . Skin cancer   . Tremor     Past Surgical History:  Procedure Laterality Date  . CARDIAC CATHETERIZATION    . CAROTID ARTERY ANGIOPLASTY Left 09/30/2011  . CORONARY ARTERY BYPASS GRAFT  2006  . ENDARTERECTOMY FEMORAL Bilateral 11/01/2014   Procedure: ENDARTERECTOMY FEMORAL;  Surgeon: Algernon Huxley, MD;  Location: [facility];  Service: Vascular;  Laterality: Bilateral;  . PERIPHERAL VASCULAR CATHETERIZATION Left 10/09/2014   Procedure: Lower Extremity Angiography;  Surgeon: Algernon Huxley, MD;  Location: [place] [facility];  Service: Cardiovascular;  Laterality: Left;  . PERIPHERAL VASCULAR CATHETERIZATION  10/09/2014   Procedure: Lower Extremity Intervention;  Surgeon: Algernon Huxley, MD;  Location: [facility] [facility];  Service: Cardiovascular;;    Family History  Problem Relation Age of Onset  . Heart disease Sister   . Heart disease Sister   . Diabetes Sister   . Diabetes Sister   . Diabetes Brother     Social History   Social History  . Marital status: Widowed    Spouse name: N/A  . Number of children: N/A  . Years of education: N/A   Occupational History  . Not on file.   Social History Main Topics  . Smoking status: Former Smoker    Years: 25.00    Quit date: 03/24/1985  . Smokeless tobacco: Never Used  . Alcohol use No  . Drug use: No  . Sexual activity: Not on file   Other Topics Concern  . Not on file   Social History Narrative  . No narrative on file     Current Outpatient Prescriptions:  .  aspirin 81 MG tablet, Take 81 mg by mouth daily., Disp: , Rfl:  .  baclofen (LIORESAL) 10 MG tablet, TAKE 1 TABLET BY MOUTH 3 TIMES DAILY AS NEEDED., Disp: 60 each, Rfl: 6 .  benazepril (LOTENSIN) 40 MG tablet, TAKE 1 TABLET  BY MOUTH ONCE DAILY., Disp: 90 tablet, Rfl: 3 .  busPIRone (BUSPAR) 5 MG tablet, Take 1 tablet (5 mg total) by mouth 2 (two) times daily. (Patient taking differently: Take 5 mg by mouth 2 (two) times daily as needed. ), Disp: 60 tablet, Rfl: 3 .  clopidogrel (PLAVIX) 75 MG tablet, Take 1 tablet (75 mg total) by mouth daily., Disp: 90 tablet, Rfl: 3 .  etodolac (LODINE) 300 MG capsule, TAKE 1 CAPSULE BY MOUTH TWICE DAILY AS NEEDED., Disp: 60 capsule, Rfl: 1 .  finasteride (PROSCAR) 5 MG tablet, TAKE 1 TABLET BY MOUTH ONCE DAILY., Disp: 90 tablet, Rfl: 3 .  furosemide (LASIX) 40 MG tablet, Take 2 tablets twice a day, in AM and early afternoon., Disp: 120 tablet, Rfl: 6 .  gabapentin (NEURONTIN) 100 MG capsule, Take 200 mg by mouth 2 (two) times daily. , Disp: , Rfl:  .  glimepiride (AMARYL) 4 MG tablet, TAKE 1 TABLET BY MOUTH ONCE DAILY, Disp: 90 tablet, Rfl: 3 .   meclizine (ANTIVERT) 25 MG tablet, Take 1 tablet (25 mg total) by mouth 3 (three) times daily as needed for dizziness., Disp: 60 tablet, Rfl: 6 .  metFORMIN (GLUCOPHAGE) 1000 MG tablet, Take 2 tablets twice a day with a meal, Disp: 360 tablet, Rfl: 3 .  metolazone (ZAROXOLYN) 2.5 MG tablet, Take 1 tablet (2.5 mg total) by mouth daily., Disp: 90 tablet, Rfl: 3 .  metoprolol (LOPRESSOR) 50 MG tablet, TAKE 1 TABLET BY MOUTH TWICE DAILY, Disp: 180 tablet, Rfl: 3 .  nitroGLYCERIN (NITROSTAT) 0.4 MG SL tablet, Place 1 tablet (0.4 mg total) under the tongue every 5 (five) minutes as needed for chest pain., Disp: 50 tablet, Rfl: 3 .  tamsulosin (FLOMAX) 0.4 MG CAPS capsule, Take 1 capsule (0.4 mg total) by mouth daily., Disp: 90 capsule, Rfl: 3  Allergies  Allergen Reactions  . Oxycodone Nausea Only     Review of Systems  Constitutional: Positive for malaise/fatigue and weight loss. Negative for chills and fever.  HENT: Negative for hearing loss.   Eyes: Negative for blurred vision and double vision.  Respiratory: Positive for cough, sputum production, shortness of breath and wheezing. Hemoptysis: thick sputum.   Cardiovascular: Negative for chest pain, palpitations and leg swelling.  Genitourinary: Negative for dysuria, frequency and urgency.  Skin: Negative for rash.  Neurological: Positive for sensory change. Negative for dizziness, tremors, weakness and headaches.      Objective  Vitals:   11/05/15 1459  BP: 119/67  Pulse: 68  Resp: 16  Temp: 97.6 F (36.4 C)  TempSrc: Oral  Weight: 157 lb (71.2 kg)  Height: 5\' 10"  (1.778 m)    Physical Exam  Constitutional: He is oriented to person, place, and time and well-developed, well-nourished, and in no distress. No distress.  HENT:  Head: Normocephalic and atraumatic.  Eyes: Conjunctivae and EOM are normal. Pupils are equal, round, and reactive to light. No scleral icterus.  Neck: Normal range of motion. Neck supple. Carotid bruit is  not present. No thyromegaly present.  Cardiovascular: Normal rate, regular rhythm and normal heart sounds.  Exam reveals no gallop and no friction rub.   No murmur heard. Pulmonary/Chest: Effort normal and breath sounds normal. No respiratory distress. He has no wheezes. He has no rales.  Musculoskeletal: He exhibits edema (R leg with 2+ pitting edema  to mid calf).  Tender over coccyx  Lymphadenopathy:    He has no cervical adenopathy.  Neurological: He is alert and oriented to  person, place, and time.  Vitals reviewed.      Recent Results (from the past 2160 hour(s))  POCT glycosylated hemoglobin (Hb A1C)     Status: None   Collection Time: 10/01/15  1:40 PM  Result Value Ref Range   Hemoglobin A1C 6.7      Assessment & Plan  Problem List Items Addressed This Visit      Cardiovascular and Mediastinum   Hypertension - Primary   Relevant Orders   BASIC METABOLIC PANEL WITH GFR   Peripheral vascular disease (HCC)   Atherosclerosis of native artery of both lower extremities with intermittent claudication (HCC)   CHF (congestive heart failure) (HCC)     Endocrine   Diabetes mellitus ([place])     Other   Pedal edema   Dizziness   Breath shortness   Relevant Orders   DG Chest 2 View   Coccyx pain   Relevant Orders   DG Sacrum/Coccyx   Early satiety   Relevant Orders   Ambulatory referral to Gastroenterology   Weight loss   Relevant Orders   Ambulatory referral to Gastroenterology    Other Visit Diagnoses   None.     No orders of the defined types were placed in this encounter.  1. Essential hypertension  - BASIC METABOLIC PANEL WITH GFR Discontinue Amlodipine. Cont others 2. Peripheral vascular disease ([place])   3. Atherosclerosis of native artery of both lower extremities with intermittent claudication ([place])   4. Systolic congestive heart failure, unspecified congestive heart failure chronicity ([place])   5. Diabetes mellitus due to underlying condition  with hyperosmolarity without coma, without long-term current use of insulin (HCC) Cont meds  6. Pedal edema   7. Dizziness   8. Breath shortness  - DG Chest 2 View; Future  9. Coccyx pain  - DG Sacrum/Coccyx; Future  10. Early satiety with nausea Refer to Dr. Allen Norris  (GI).

## 2015-11-06 ENCOUNTER — Other Ambulatory Visit: Payer: Self-pay | Admitting: Family Medicine

## 2015-11-06 ENCOUNTER — Other Ambulatory Visit: Payer: Self-pay | Admitting: *Deleted

## 2015-11-06 DIAGNOSIS — I10 Essential (primary) hypertension: Secondary | ICD-10-CM

## 2015-11-06 DIAGNOSIS — E119 Type 2 diabetes mellitus without complications: Secondary | ICD-10-CM

## 2015-11-06 DIAGNOSIS — N289 Disorder of kidney and ureter, unspecified: Secondary | ICD-10-CM

## 2015-11-06 LAB — BASIC METABOLIC PANEL WITH GFR
BUN: 118 mg/dL — AB (ref 7–25)
CO2: 29 mmol/L (ref 20–31)
CREATININE: 3.59 mg/dL — AB (ref 0.70–1.18)
Calcium: 9.8 mg/dL (ref 8.6–10.3)
Chloride: 96 mmol/L — ABNORMAL LOW (ref 98–110)
GFR, EST AFRICAN AMERICAN: 18 mL/min — AB (ref >=60)
GFR, Est Non African American: 16 mL/min — ABNORMAL LOW (ref >=60)
GLUCOSE: 134 mg/dL — AB (ref 65–99)
POTASSIUM: 3.3 mmol/L — AB (ref 3.5–5.3)
Sodium: 140 mmol/L (ref 135–146)

## 2015-11-06 MED ORDER — GLIMEPIRIDE 4 MG PO TABS: 8.0000 mg | ORAL_TABLET | Freq: Every day | ORAL | 3 refills | Status: DC

## 2015-11-06 MED ORDER — POTASSIUM CHLORIDE ER 10 MEQ PO TBCR: 10.0000 meq | EXTENDED_RELEASE_TABLET | Freq: Every day | ORAL | 6 refills | Status: DC

## 2015-11-08 ENCOUNTER — Telehealth: Payer: Self-pay | Admitting: *Deleted

## 2015-11-08 ENCOUNTER — Encounter: Payer: Self-pay | Admitting: *Deleted

## 2015-11-08 NOTE — Telephone Encounter (Signed)
Patient aware of recommendations, appt rescheduled 8/21 and nephrology appt 11/22/15 1st available.

## 2015-11-08 NOTE — Telephone Encounter (Signed)
Per Dr. Luan Pulling: 1- Patient needs to reschedule appt that was cancelled on 11/12/15. His kidney function is critical and this may be causing some off his discomfort.He needs to re-evaluate patient re: pain and sleep concerns.  Important: patient needs to STOP Metformin and Zaroxolyn immediately.  Patient needs to be seen by Nephrology- appt has been scheduled 11/22/15 at 8:40 in [facility] office.

## 2015-11-12 ENCOUNTER — Ambulatory Visit: Payer: Commercial Managed Care - HMO | Admitting: Family Medicine

## 2015-11-12 ENCOUNTER — Encounter: Payer: Self-pay | Admitting: Family Medicine

## 2015-11-12 ENCOUNTER — Ambulatory Visit (INDEPENDENT_AMBULATORY_CARE_PROVIDER_SITE_OTHER): Payer: Commercial Managed Care - HMO | Admitting: Family Medicine

## 2015-11-12 VITALS — BP 130/74 | HR 93 | Resp 16 | Ht 72.0 in | Wt 157.0 lb

## 2015-11-12 DIAGNOSIS — E0800 Diabetes mellitus due to underlying condition with hyperosmolarity without nonketotic hyperglycemic-hyperosmolar coma (NKHHC): Secondary | ICD-10-CM | POA: Diagnosis not present

## 2015-11-12 DIAGNOSIS — R6881 Early satiety: Secondary | ICD-10-CM

## 2015-11-12 DIAGNOSIS — N19 Unspecified kidney failure: Secondary | ICD-10-CM

## 2015-11-12 DIAGNOSIS — R600 Localized edema: Secondary | ICD-10-CM

## 2015-11-12 DIAGNOSIS — I5020 Unspecified systolic (congestive) heart failure: Secondary | ICD-10-CM | POA: Diagnosis not present

## 2015-11-12 DIAGNOSIS — R634 Abnormal weight loss: Secondary | ICD-10-CM | POA: Diagnosis not present

## 2015-11-12 LAB — CBC WITH DIFFERENTIAL/PLATELET
BASOS PCT: 0 %
Basophils Absolute: 0 cells/uL (ref 0–200)
EOS ABS: 180 {cells}/uL (ref 15–500)
Eosinophils Relative: 3 %
HCT: 36 % — ABNORMAL LOW (ref 38.5–50.0)
Hemoglobin: 12.2 g/dL — ABNORMAL LOW (ref 13.2–17.1)
LYMPHS PCT: 31 %
Lymphs Abs: 1860 cells/uL (ref 850–3900)
MCH: 30.5 pg (ref 27.0–33.0)
MCHC: 33.9 g/dL (ref 32.0–36.0)
MCV: 90 fL (ref 80.0–100.0)
MONOS PCT: 9 %
MPV: 10.5 fL (ref 7.5–12.5)
Monocytes Absolute: 540 cells/uL (ref 200–950)
NEUTROS ABS: 3420 {cells}/uL (ref 1500–7800)
Neutrophils Relative %: 57 %
PLATELETS: 147 10*3/uL (ref 140–400)
RBC: 4 MIL/uL — AB (ref 4.20–5.80)
RDW: 13.2 % (ref 11.0–15.0)
WBC: 6 10*3/uL (ref 3.8–10.8)

## 2015-11-12 LAB — BASIC METABOLIC PANEL WITH GFR
BUN: 74 mg/dL — ABNORMAL HIGH (ref 7–25)
CHLORIDE: 95 mmol/L — AB (ref 98–110)
CO2: 34 mmol/L — AB (ref 20–31)
Calcium: 9.9 mg/dL (ref 8.6–10.3)
Creat: 2.43 mg/dL — ABNORMAL HIGH (ref 0.70–1.18)
GFR, EST NON AFRICAN AMERICAN: 25 mL/min — AB (ref >=60)
GFR, Est African American: 29 mL/min — ABNORMAL LOW (ref >=60)
Glucose, Bld: 198 mg/dL — ABNORMAL HIGH (ref 65–99)
Potassium: 3.6 mmol/L (ref 3.5–5.3)
SODIUM: 139 mmol/L (ref 135–146)

## 2015-11-12 MED ORDER — TRAMADOL HCL 50 MG PO TABS
50.0000 mg | ORAL_TABLET | Freq: Three times a day (TID) | ORAL | 0 refills | Status: DC | PRN
Start: 2015-11-12 — End: 2015-12-11

## 2015-11-12 MED ORDER — GLIMEPIRIDE 4 MG PO TABS: ORAL_TABLET | ORAL | 3 refills | Status: DC

## 2015-11-12 MED ORDER — HYDROXYZINE HCL 25 MG PO TABS
25.0000 mg | ORAL_TABLET | Freq: Three times a day (TID) | ORAL | 0 refills | Status: DC | PRN
Start: 2015-11-12 — End: 2015-12-10

## 2015-11-12 NOTE — Progress Notes (Signed)
Name: Randy Howard   MRN: [id number]    DOB: [birth year]   Date:11/12/2015       Progress Note  Subjective  Chief Complaint  Chief Complaint  Patient presents with  . Acute Renal Failure  . Diabetes  . Weight Loss  . Insomnia    HPI For f/u of acute renal failure, DM with peripheral neuropathy causing lots of pain, weight loss with early satiety and nausea.  He has stopped Zaroxolyn and Metformin as directed.  He is not sleeping well.  No problem-specific Assessment & Plan notes found for this encounter.   Past Medical History:  Diagnosis Date  . Arthritis   . Coronary artery disease   . Diabetes type 2, controlled ([place])   . Elevated lipids   . Hyperlipidemia   . Hypertension   . Neuropathy ([place])   . Nocturia   . Obstructive sleep apnea   . Peripheral vascular disease ([place])   . Prostate enlargement   . PVD (peripheral vascular disease) ([place])   . Right carotid bruit   . Skin cancer    Followed by Dr. Evorn Gong  . Skin cancer   . Tremor     Past Surgical History:  Procedure Laterality Date  . CARDIAC CATHETERIZATION    . CAROTID ARTERY ANGIOPLASTY Left 09/30/2011  . CORONARY ARTERY BYPASS GRAFT  2006  . ENDARTERECTOMY FEMORAL Bilateral 11/01/2014   Procedure: ENDARTERECTOMY FEMORAL;  Surgeon: Algernon Huxley, MD;  Location: [facility];  Service: Vascular;  Laterality: Bilateral;  . PERIPHERAL VASCULAR CATHETERIZATION Left 10/09/2014   Procedure: Lower Extremity Angiography;  Surgeon: Algernon Huxley, MD;  Location: [place] [facility];  Service: Cardiovascular;  Laterality: Left;  . PERIPHERAL VASCULAR CATHETERIZATION  10/09/2014   Procedure: Lower Extremity Intervention;  Surgeon: Algernon Huxley, MD;  Location: [facility];  Service: Cardiovascular;;    Family History  Problem Relation Age of Onset  . Heart disease Sister   . Heart disease Sister   . Diabetes Sister   . Diabetes Sister   . Diabetes Brother     Social History   Social History  . Marital status:  Widowed    Spouse name: N/A  . Number of children: N/A  . Years of education: N/A   Occupational History  . Not on file.   Social History Main Topics  . Smoking status: Former Smoker    Years: 25.00    Quit date: 03/24/1985  . Smokeless tobacco: Never Used  . Alcohol use No  . Drug use: No  . Sexual activity: Not on file   Other Topics Concern  . Not on file   Social History Narrative  . No narrative on file     Current Outpatient Prescriptions:  .  aspirin 81 MG tablet, Take 81 mg by mouth daily., Disp: , Rfl:  .  baclofen (LIORESAL) 10 MG tablet, TAKE 1 TABLET BY MOUTH 3 TIMES DAILY AS NEEDED., Disp: 60 each, Rfl: 6 .  benazepril (LOTENSIN) 40 MG tablet, TAKE 1 TABLET BY MOUTH ONCE DAILY., Disp: 90 tablet, Rfl: 3 .  busPIRone (BUSPAR) 5 MG tablet, Take 1 tablet (5 mg total) by mouth 2 (two) times daily. (Patient taking differently: Take 5 mg by mouth 2 (two) times daily as needed. ), Disp: 60 tablet, Rfl: 3 .  clopidogrel (PLAVIX) 75 MG tablet, Take 1 tablet (75 mg total) by mouth daily., Disp: 90 tablet, Rfl: 3 .  etodolac (LODINE) 300 MG capsule, TAKE 1  CAPSULE BY MOUTH TWICE DAILY AS NEEDED., Disp: 60 capsule, Rfl: 1 .  finasteride (PROSCAR) 5 MG tablet, TAKE 1 TABLET BY MOUTH ONCE DAILY., Disp: 90 tablet, Rfl: 3 .  furosemide (LASIX) 40 MG tablet, Take 2 tablets twice a day, in AM and early afternoon., Disp: 120 tablet, Rfl: 6 .  gabapentin (NEURONTIN) 100 MG capsule, Take 200 mg by mouth 2 (two) times daily. , Disp: , Rfl:  .  glimepiride (AMARYL) 4 MG tablet, Take 1 tablet each morning, Disp: 180 tablet, Rfl: 3 .  hydrOXYzine (ATARAX/VISTARIL) 25 MG tablet, Take 1 tablet (25 mg total) by mouth 3 (three) times daily as needed., Disp: 30 tablet, Rfl: 0 .  meclizine (ANTIVERT) 25 MG tablet, Take 1 tablet (25 mg total) by mouth 3 (three) times daily as needed for dizziness., Disp: 60 tablet, Rfl: 6 .  metolazone (ZAROXOLYN) 2.5 MG tablet, Take 1 tablet (2.5 mg total) by mouth  daily., Disp: 90 tablet, Rfl: 3 .  metoprolol (LOPRESSOR) 50 MG tablet, TAKE 1 TABLET BY MOUTH TWICE DAILY, Disp: 180 tablet, Rfl: 3 .  nitroGLYCERIN (NITROSTAT) 0.4 MG SL tablet, Place 1 tablet (0.4 mg total) under the tongue every 5 (five) minutes as needed for chest pain., Disp: 50 tablet, Rfl: 3 .  potassium chloride (K-DUR) 10 MEQ tablet, Take 1 tablet (10 mEq total) by mouth daily., Disp: 30 tablet, Rfl: 6 .  tamsulosin (FLOMAX) 0.4 MG CAPS capsule, Take 1 capsule (0.4 mg total) by mouth daily., Disp: 90 capsule, Rfl: 3 .  traMADol (ULTRAM) 50 MG tablet, Take 1 tablet (50 mg total) by mouth every 8 (eight) hours as needed., Disp: 30 tablet, Rfl: 0  Allergies  Allergen Reactions  . Oxycodone Nausea Only     Review of Systems  Constitutional: Positive for malaise/fatigue and weight loss. Negative for chills and fever.  HENT: Positive for hearing loss.   Eyes: Positive for blurred vision and double vision.  Respiratory: Positive for shortness of breath. Negative for cough and wheezing.   Cardiovascular: Positive for leg swelling. Negative for chest pain and palpitations.  Gastrointestinal: Positive for nausea. Negative for blood in stool and heartburn.       Early satiety  Genitourinary: Negative for dysuria, frequency and urgency.  Skin: Negative for rash.  Neurological: Positive for tingling, weakness and headaches.       Bilaeral peripheral neuropathy      Objective  Vitals:   11/12/15 1428  BP: 130/74  Pulse: 93  Resp: 16  Weight: 157 lb (71.2 kg)  Height: 6' (1.829 m)    Physical Exam  Constitutional: He is oriented to person, place, and time and well-developed, well-nourished, and in no distress. No distress.  HENT:  Head: Normocephalic and atraumatic.  Neck: Normal range of motion. Carotid bruit is not present. No thyromegaly present.  Cardiovascular: Normal rate, regular rhythm and normal heart sounds.  Exam reveals no gallop and no friction rub.   No murmur  heard. Pulmonary/Chest: Effort normal and breath sounds normal. No respiratory distress. He has no wheezes. He has no rales.  Abdominal: Soft. Bowel sounds are normal. He exhibits no mass. There is tenderness (diffuse lower abd tenderness.).  Musculoskeletal: He exhibits edema (trace pedal edema bilateral feet.).  Lymphadenopathy:    He has no cervical adenopathy.  Neurological: He is alert and oriented to person, place, and time.  Vitals reviewed.      Recent Results (from the past 2160 hour(s))  POCT glycosylated hemoglobin (Hb A1C)  Status: None   Collection Time: 10/01/15  1:40 PM  Result Value Ref Range   Hemoglobin A1C 6.7   BASIC METABOLIC PANEL WITH GFR     Status: Abnormal   Collection Time: 11/05/15  4:36 PM  Result Value Ref Range   Sodium 140 135 - 146 mmol/L   Potassium 3.3 (L) 3.5 - 5.3 mmol/L   Chloride 96 (L) 98 - 110 mmol/L   CO2 29 20 - 31 mmol/L   Glucose, Bld 134 (H) 65 - 99 mg/dL   BUN 118 (H) 7 - 25 mg/dL    Comment: Result repeated and verified.   Creat 3.59 (H) 0.70 - 1.18 mg/dL    Comment:   For patients > or = 76 years of age: The upper reference limit for Creatinine is approximately 13% higher for people identified as African-American.      Calcium 9.8 8.6 - 10.3 mg/dL   GFR, Est African American 18 (L) >=60 mL/min   GFR, Est Non African American 16 (L) >=60 mL/min     Assessment & Plan  Problem List Items Addressed This Visit      Cardiovascular and Mediastinum   CHF (congestive heart failure) (HCC) - Primary     Endocrine   Diabetes mellitus (HCC)   Relevant Medications   glimepiride (AMARYL) 4 MG tablet     Other   Pedal edema   Early satiety   Weight loss    Other Visit Diagnoses    Renal failure       Relevant Orders   BASIC METABOLIC PANEL WITH GFR   CBC with Differential      Meds ordered this encounter  Medications  . hydrOXYzine (ATARAX/VISTARIL) 25 MG tablet    Sig: Take 1 tablet (25 mg total) by mouth 3  (three) times daily as needed.    Dispense:  30 tablet    Refill:  0  . traMADol (ULTRAM) 50 MG tablet    Sig: Take 1 tablet (50 mg total) by mouth every 8 (eight) hours as needed.    Dispense:  30 tablet    Refill:  0  . glimepiride (AMARYL) 4 MG tablet    Sig: Take 1 tablet each morning    Dispense:  180 tablet    Refill:  3   1. Systolic congestive heart failure, unspecified congestive heart failure chronicity ([place])   2. Diabetes mellitus due to underlying condition with hyperosmolarity without coma, without long-term current use of insulin (HCC)  - glimepiride (AMARYL) 4 MG tablet; Take 1 tablet each morning  Dispense: 180 tablet; Refill: 3  3. Pedal edema   4. Weight loss   5. Early satiety Keep GI appt.  6. Renal failure Keep Nephrology appt,.- BASIC METABOLIC PANEL WITH GFR - CBC with Differential

## 2015-11-16 ENCOUNTER — Telehealth: Payer: Self-pay

## 2015-11-16 NOTE — Telephone Encounter (Signed)
Called patient, he does not want to do the colonoscopy or the upper endoscopy at this time. Patient states that he has so much going on right now. I told him that it's very important that he has these procedures done because of his current conditions. Patient still declined to have them done at this time. I offered the patient an office visit appointment with Dr. Allen Norris and he declined that as well. I told him that I will send him a notification letter to his address as a reminder for him to contact me when he is ready.

## 2015-11-22 ENCOUNTER — Other Ambulatory Visit: Payer: Self-pay | Admitting: Family Medicine

## 2015-11-22 DIAGNOSIS — R42 Dizziness and giddiness: Secondary | ICD-10-CM

## 2015-12-02 ENCOUNTER — Other Ambulatory Visit: Payer: Self-pay | Admitting: Family Medicine

## 2015-12-02 DIAGNOSIS — I10 Essential (primary) hypertension: Secondary | ICD-10-CM

## 2015-12-04 ENCOUNTER — Telehealth: Payer: Self-pay | Admitting: Family Medicine

## 2015-12-04 NOTE — Telephone Encounter (Signed)
Pt has appt with Sidney Kidney on Sept 15th at 1:20 but is not sure of the location.  Please call [phone]

## 2015-12-04 NOTE — Telephone Encounter (Signed)
[place] Dr. Achille Rich [place] [phone]

## 2015-12-10 ENCOUNTER — Ambulatory Visit: Payer: Commercial Managed Care - HMO | Admitting: Family Medicine

## 2015-12-10 ENCOUNTER — Other Ambulatory Visit: Payer: Self-pay | Admitting: Family Medicine

## 2015-12-11 ENCOUNTER — Other Ambulatory Visit: Payer: Self-pay | Admitting: Family Medicine

## 2015-12-11 MED ORDER — TRAMADOL HCL 50 MG PO TABS: 50.0000 mg | ORAL_TABLET | Freq: Three times a day (TID) | ORAL | 3 refills | Status: DC | PRN

## 2015-12-12 ENCOUNTER — Other Ambulatory Visit: Payer: Self-pay | Admitting: Family Medicine

## 2015-12-12 NOTE — Telephone Encounter (Signed)
Pt. Called requesting something for vomiting . Pt call back  # is [phone]

## 2015-12-13 MED ORDER — ONDANSETRON 4 MG PO TBDP: 4.0000 mg | ORAL_TABLET | Freq: Three times a day (TID) | ORAL | 0 refills | Status: DC | PRN

## 2015-12-13 NOTE — Telephone Encounter (Signed)
Send in Rx for Zofran 4 mg, sub ling., #10 / no refills.  1 SL every 8 hours as needed for N/V.-jh

## 2015-12-31 ENCOUNTER — Ambulatory Visit: Payer: Commercial Managed Care - HMO | Admitting: Family Medicine

## 2016-02-05 ENCOUNTER — Telehealth: Payer: Self-pay | Admitting: Family Medicine

## 2016-02-05 NOTE — Telephone Encounter (Signed)
[employer] [phone] Valid 02/05/16-08/04/15 #6 visit npi [id number]

## 2016-02-05 NOTE — Telephone Encounter (Signed)
Pt is at [place] needs a [facility] referral for pt to see Dr. Garald Braver [phone] for N28.9 and hypertension.  Her call back number is [phone]

## 2016-03-07 ENCOUNTER — Other Ambulatory Visit: Payer: Self-pay | Admitting: Family Medicine

## 2016-03-07 ENCOUNTER — Telehealth: Payer: Self-pay | Admitting: Family Medicine

## 2016-03-07 DIAGNOSIS — I5022 Chronic systolic (congestive) heart failure: Secondary | ICD-10-CM

## 2016-03-07 DIAGNOSIS — R600 Localized edema: Secondary | ICD-10-CM

## 2016-03-07 NOTE — Telephone Encounter (Signed)
Error

## 2016-03-14 ENCOUNTER — Encounter (INDEPENDENT_AMBULATORY_CARE_PROVIDER_SITE_OTHER): Payer: Self-pay

## 2016-03-14 ENCOUNTER — Ambulatory Visit (INDEPENDENT_AMBULATORY_CARE_PROVIDER_SITE_OTHER): Payer: Self-pay | Admitting: Vascular Surgery

## 2016-03-14 ENCOUNTER — Other Ambulatory Visit (INDEPENDENT_AMBULATORY_CARE_PROVIDER_SITE_OTHER): Payer: Self-pay

## 2016-04-21 ENCOUNTER — Ambulatory Visit: Payer: Commercial Managed Care - HMO | Admitting: Family Medicine

## 2016-04-28 ENCOUNTER — Encounter: Payer: Self-pay | Admitting: Family Medicine

## 2016-04-28 ENCOUNTER — Ambulatory Visit (INDEPENDENT_AMBULATORY_CARE_PROVIDER_SITE_OTHER): Payer: Medicare HMO | Admitting: Family Medicine

## 2016-04-28 ENCOUNTER — Encounter: Payer: Self-pay | Admitting: *Deleted

## 2016-04-28 ENCOUNTER — Other Ambulatory Visit: Payer: Self-pay | Admitting: *Deleted

## 2016-04-28 VITALS — BP 150/75 | HR 66 | Resp 16 | Ht 69.0 in | Wt 160.0 lb

## 2016-04-28 DIAGNOSIS — Z23 Encounter for immunization: Secondary | ICD-10-CM | POA: Diagnosis not present

## 2016-04-28 DIAGNOSIS — E0800 Diabetes mellitus due to underlying condition with hyperosmolarity without nonketotic hyperglycemic-hyperosmolar coma (NKHHC): Secondary | ICD-10-CM

## 2016-04-28 DIAGNOSIS — R251 Tremor, unspecified: Secondary | ICD-10-CM

## 2016-04-28 DIAGNOSIS — I70213 Atherosclerosis of native arteries of extremities with intermittent claudication, bilateral legs: Secondary | ICD-10-CM | POA: Diagnosis not present

## 2016-04-28 DIAGNOSIS — I739 Peripheral vascular disease, unspecified: Secondary | ICD-10-CM | POA: Diagnosis not present

## 2016-04-28 DIAGNOSIS — E7800 Pure hypercholesterolemia, unspecified: Secondary | ICD-10-CM | POA: Diagnosis not present

## 2016-04-28 DIAGNOSIS — R634 Abnormal weight loss: Secondary | ICD-10-CM

## 2016-04-28 DIAGNOSIS — R42 Dizziness and giddiness: Secondary | ICD-10-CM

## 2016-04-28 DIAGNOSIS — R600 Localized edema: Secondary | ICD-10-CM | POA: Diagnosis not present

## 2016-04-28 DIAGNOSIS — I10 Essential (primary) hypertension: Secondary | ICD-10-CM

## 2016-04-28 NOTE — Progress Notes (Signed)
Name: Randy Howard   MRN: [id number]    DOB: [birth year]   Date:04/28/2016       Progress Note  Subjective  Chief Complaint  Chief Complaint  Patient presents with  . Hypertension  . Diabetes    HPI Here for f/u of HBP, DM, elevated lipids.  He is still having occ. "dizzy spells".  No more nausea.  Taking meclizine  Prn.  Not checking B Ss at home.  Not taking BPs at home.  No problem-specific Assessment & Plan notes found for this encounter.   Past Medical History:  Diagnosis Date  . Arthritis   . Coronary artery disease   . Diabetes type 2, controlled ([place])   . Elevated lipids   . Hyperlipidemia   . Hypertension   . Neuropathy ([place])   . Nocturia   . Obstructive sleep apnea   . Peripheral vascular disease ([place])   . Prostate enlargement   . PVD (peripheral vascular disease) ([place])   . Right carotid bruit   . Skin cancer    Followed by Dr. Evorn Gong  . Skin cancer   . Tremor     Past Surgical History:  Procedure Laterality Date  . CARDIAC CATHETERIZATION    . CAROTID ARTERY ANGIOPLASTY Left 09/30/2011  . CORONARY ARTERY BYPASS GRAFT  2006  . ENDARTERECTOMY FEMORAL Bilateral 11/01/2014   Procedure: ENDARTERECTOMY FEMORAL;  Surgeon: Algernon Huxley, MD;  Location: [facility];  Service: Vascular;  Laterality: Bilateral;  . PERIPHERAL VASCULAR CATHETERIZATION Left 10/09/2014   Procedure: Lower Extremity Angiography;  Surgeon: Algernon Huxley, MD;  Location: [place];  Service: Cardiovascular;  Laterality: Left;  . PERIPHERAL VASCULAR CATHETERIZATION  10/09/2014   Procedure: Lower Extremity Intervention;  Surgeon: Algernon Huxley, MD;  Location: [facility];  Service: Cardiovascular;;    Family History  Problem Relation Age of Onset  . Heart disease Sister   . Heart disease Sister   . Diabetes Sister   . Diabetes Sister   . Diabetes Brother     Social History   Social History  . Marital status: Widowed    Spouse name: N/A  . Number of children: N/A  . Years  of education: N/A   Occupational History  . Not on file.   Social History Main Topics  . Smoking status: Former Smoker    Packs/day: 1.00    Years: 25.00    Types: Cigarettes    Quit date: 03/24/1985  . Smokeless tobacco: Never Used  . Alcohol use No  . Drug use: No  . Sexual activity: Not on file   Other Topics Concern  . Not on file   Social History Narrative  . No narrative on file     Current Outpatient Prescriptions:  .  aspirin 81 MG tablet, Take 81 mg by mouth daily., Disp: , Rfl:  .  baclofen (LIORESAL) 10 MG tablet, TAKE 1 TABLET BY MOUTH 3 TIMES DAILY AS NEEDED., Disp: 60 each, Rfl: 2 .  benazepril (LOTENSIN) 40 MG tablet, TAKE 1 TABLET BY MOUTH ONCE DAILY., Disp: 90 tablet, Rfl: 0 .  busPIRone (BUSPAR) 5 MG tablet, Take 1 tablet (5 mg total) by mouth 2 (two) times daily. (Patient taking differently: Take 5 mg by mouth 2 (two) times daily as needed. ), Disp: 60 tablet, Rfl: 3 .  clopidogrel (PLAVIX) 75 MG tablet, Take 1 tablet (75 mg total) by mouth daily., Disp: 90 tablet, Rfl: 3 .  etodolac (LODINE) 300 MG  capsule, TAKE 1 CAPSULE BY MOUTH TWICE DAILY AS NEEDED., Disp: 60 capsule, Rfl: 6 .  finasteride (PROSCAR) 5 MG tablet, TAKE 1 TABLET BY MOUTH ONCE DAILY., Disp: 90 tablet, Rfl: 3 .  furosemide (LASIX) 40 MG tablet, TAKE 2 TABLETS BY MOUTH TWICE DAILY (MORNING AND EARLY AFTERNOON) (Patient taking differently: TAKE 2 TABLETS BY MOUTH TWICE DAILY (MORNING AND EARLY AFTERNOON) as needed), Disp: 120 tablet, Rfl: 3 .  gabapentin (NEURONTIN) 100 MG capsule, Take 300 mg by mouth 2 (two) times daily. , Disp: , Rfl:  .  glimepiride (AMARYL) 4 MG tablet, Take 1 tablet each morning, Disp: 180 tablet, Rfl: 3 .  hydrOXYzine (ATARAX/VISTARIL) 25 MG tablet, TAKE 1 TABLET BY MOUTH 3 TIMES DAILY AS NEEDED, Disp: 30 tablet, Rfl: 12 .  meclizine (ANTIVERT) 25 MG tablet, TAKE 1 TABLET BY MOUTH 3 TIMES DAILY AS NEEDED FOR DIZZINESS., Disp: 60 tablet, Rfl: 3 .  metoprolol (LOPRESSOR) 50 MG  tablet, TAKE 1 TABLET BY MOUTH TWICE DAILY, Disp: 180 tablet, Rfl: 3 .  ondansetron (ZOFRAN ODT) 4 MG disintegrating tablet, Take 1 tablet (4 mg total) by mouth every 8 (eight) hours as needed for nausea or vomiting., Disp: 10 tablet, Rfl: 0 .  potassium chloride (K-DUR) 10 MEQ tablet, Take 1 tablet (10 mEq total) by mouth daily., Disp: 30 tablet, Rfl: 6 .  tamsulosin (FLOMAX) 0.4 MG CAPS capsule, Take 1 capsule (0.4 mg total) by mouth daily., Disp: 90 capsule, Rfl: 3 .  traMADol (ULTRAM) 50 MG tablet, Take 1 tablet (50 mg total) by mouth every 8 (eight) hours as needed., Disp: 30 tablet, Rfl: 3 .  nitroGLYCERIN (NITROSTAT) 0.4 MG SL tablet, Place 1 tablet (0.4 mg total) under the tongue every 5 (five) minutes as needed for chest pain. (Patient not taking: Reported on 04/28/2016), Disp: 50 tablet, Rfl: 3  Allergies  Allergen Reactions  . Oxycodone Nausea Only     Review of Systems  Constitutional: Negative for chills, fever, malaise/fatigue and weight loss.  HENT: Negative for hearing loss and tinnitus.   Eyes: Negative for blurred vision and double vision.  Respiratory: Negative for cough, shortness of breath and wheezing.   Cardiovascular: Positive for leg swelling (on and off). Negative for chest pain and palpitations.  Gastrointestinal: Negative for abdominal pain, blood in stool and heartburn.  Genitourinary: Negative for dysuria, frequency and urgency.  Musculoskeletal: Negative for joint pain and myalgias.  Skin: Negative for rash.  Neurological: Positive for dizziness and tingling. Negative for tremors, weakness and headaches.      Objective  Vitals:   04/28/16 1405 04/28/16 1430  BP: (!) 151/72 (!) 150/75  Pulse: (!) 56 66  Resp: 16   Weight: 160 lb (72.6 kg)   Height: 5\' 9"  (1.753 m)     Physical Exam  Constitutional: He is oriented to person, place, and time and well-developed, well-nourished, and in no distress. No distress.  HENT:  Head: Normocephalic and  atraumatic.  Eyes: Conjunctivae and EOM are normal. Pupils are equal, round, and reactive to light. No scleral icterus.  Neck: Normal range of motion. Neck supple. Carotid bruit is not present. No thyromegaly present.  Cardiovascular: Normal rate, regular rhythm and normal heart sounds.  Exam reveals no gallop and no friction rub.   No murmur heard. Pulmonary/Chest: Effort normal and breath sounds normal. No respiratory distress. He has no wheezes. He has no rales.  Abdominal: Soft. Bowel sounds are normal. He exhibits no distension, no abdominal bruit and no mass.  There is no tenderness.  Musculoskeletal: He exhibits no edema.  Lymphadenopathy:    He has no cervical adenopathy.  Neurological: He is alert and oriented to person, place, and time.  Some dizziness with head motion.  Walks with a cane.  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  Vitals reviewed.      No results found for this or any previous visit (from the past 2160 hour(s)).   Assessment & Plan  Problem List Items Addressed This Visit      Cardiovascular and Mediastinum   Hypertension - Primary   Relevant Orders   COMPLETE METABOLIC PANEL WITH GFR   Peripheral vascular disease (HCC)   Atherosclerosis of native artery of both lower extremities with intermittent claudication (HCC)     Endocrine   Diabetes mellitus (HCC)   Relevant Orders   HgB A1c     Other   Elevated cholesterol   Relevant Orders   Lipid Profile   Pedal edema   Dizziness   Relevant Orders   CBC with Differential   Tremor   Weight loss      No orders of the defined types were placed in this encounter. 1. Essential hypertension  - COMPLETE METABOLIC PANEL WITH GFR  2. Peripheral vascular disease ([place])   3. Atherosclerosis of native artery of both lower extremities with intermittent claudication ([place])   4. Diabetes mellitus due to underlying condition with hyperosmolarity without coma, without long-term current use of insulin  (HCC)  - HgB A1c  5. Weight loss   6. Tremor   7. Pedal edema   8. Elevated cholesterol  - Lipid Profile  9. Dizziness - CBC with Differential  Cont all current at current doses.

## 2016-04-28 NOTE — Addendum Note (Signed)
Addended by: Devona Konig on: 04/28/2016 02:49 PM   Modules accepted: Orders

## 2016-05-05 ENCOUNTER — Other Ambulatory Visit: Payer: Medicare HMO

## 2016-05-09 ENCOUNTER — Other Ambulatory Visit: Payer: Medicare HMO

## 2016-05-09 DIAGNOSIS — E0800 Diabetes mellitus due to underlying condition with hyperosmolarity without nonketotic hyperglycemic-hyperosmolar coma (NKHHC): Secondary | ICD-10-CM | POA: Diagnosis not present

## 2016-05-09 DIAGNOSIS — R42 Dizziness and giddiness: Secondary | ICD-10-CM | POA: Diagnosis not present

## 2016-05-09 DIAGNOSIS — I10 Essential (primary) hypertension: Secondary | ICD-10-CM | POA: Diagnosis not present

## 2016-05-09 DIAGNOSIS — E7800 Pure hypercholesterolemia, unspecified: Secondary | ICD-10-CM | POA: Diagnosis not present

## 2016-05-09 LAB — CBC WITH DIFFERENTIAL/PLATELET
Basophils Absolute: 52 cells/uL (ref 0–200)
Basophils Relative: 1 %
EOS PCT: 3 %
Eosinophils Absolute: 156 cells/uL (ref 15–500)
HCT: 39.6 % (ref 38.5–50.0)
HEMOGLOBIN: 12.8 g/dL — AB (ref 13.2–17.1)
LYMPHS ABS: 1820 {cells}/uL (ref 850–3900)
Lymphocytes Relative: 35 %
MCH: 29.2 pg (ref 27.0–33.0)
MCHC: 32.3 g/dL (ref 32.0–36.0)
MCV: 90.2 fL (ref 80.0–100.0)
MONOS PCT: 8 %
MPV: 11.1 fL (ref 7.5–12.5)
Monocytes Absolute: 416 cells/uL (ref 200–950)
NEUTROS PCT: 53 %
Neutro Abs: 2756 cells/uL (ref 1500–7800)
PLATELETS: 155 10*3/uL (ref 140–400)
RBC: 4.39 MIL/uL (ref 4.20–5.80)
RDW: 14.2 % (ref 11.0–15.0)
WBC: 5.2 10*3/uL (ref 3.8–10.8)

## 2016-05-10 LAB — LIPID PANEL
CHOLESTEROL: 105 mg/dL (ref <200)
HDL: 31 mg/dL — ABNORMAL LOW (ref >40)
LDL Cholesterol: 61 mg/dL (ref <100)
TRIGLYCERIDES: 64 mg/dL (ref <150)
Total CHOL/HDL Ratio: 3.4 Ratio (ref <5.0)
VLDL: 13 mg/dL (ref <30)

## 2016-05-10 LAB — COMPLETE METABOLIC PANEL WITH GFR
ALBUMIN: 3.7 g/dL (ref 3.6–5.1)
ALK PHOS: 68 U/L (ref 40–115)
ALT: 24 U/L (ref 9–46)
AST: 22 U/L (ref 10–35)
BUN: 19 mg/dL (ref 7–25)
CALCIUM: 9.1 mg/dL (ref 8.6–10.3)
CO2: 29 mmol/L (ref 20–31)
Chloride: 106 mmol/L (ref 98–110)
Creat: 1.19 mg/dL — ABNORMAL HIGH (ref 0.70–1.18)
GFR, EST NON AFRICAN AMERICAN: 59 mL/min — AB (ref >=60)
GFR, Est African American: 68 mL/min (ref >=60)
Glucose, Bld: 107 mg/dL — ABNORMAL HIGH (ref 65–99)
POTASSIUM: 3.7 mmol/L (ref 3.5–5.3)
Sodium: 142 mmol/L (ref 135–146)
Total Bilirubin: 0.5 mg/dL (ref 0.2–1.2)
Total Protein: 6.4 g/dL (ref 6.1–8.1)

## 2016-05-10 LAB — HEMOGLOBIN A1C
Hgb A1c MFr Bld: 6.2 % — ABNORMAL HIGH (ref <5.7)
Mean Plasma Glucose: 131 mg/dL

## 2016-05-15 ENCOUNTER — Other Ambulatory Visit: Payer: Self-pay | Admitting: Family Medicine

## 2016-05-15 DIAGNOSIS — R42 Dizziness and giddiness: Secondary | ICD-10-CM

## 2016-05-16 ENCOUNTER — Encounter: Payer: Self-pay | Admitting: Vascular Surgery

## 2016-05-26 ENCOUNTER — Other Ambulatory Visit: Payer: Self-pay | Admitting: *Deleted

## 2016-05-26 MED ORDER — HYDROXYZINE HCL 25 MG PO TABS: 25.0000 mg | ORAL_TABLET | Freq: Three times a day (TID) | ORAL | 12 refills | Status: DC | PRN

## 2016-06-04 ENCOUNTER — Other Ambulatory Visit (INDEPENDENT_AMBULATORY_CARE_PROVIDER_SITE_OTHER): Payer: Medicare HMO

## 2016-06-04 ENCOUNTER — Ambulatory Visit (INDEPENDENT_AMBULATORY_CARE_PROVIDER_SITE_OTHER): Payer: Medicare HMO | Admitting: Vascular Surgery

## 2016-06-04 ENCOUNTER — Encounter (INDEPENDENT_AMBULATORY_CARE_PROVIDER_SITE_OTHER): Payer: Medicare HMO

## 2016-06-17 ENCOUNTER — Other Ambulatory Visit: Payer: Self-pay | Admitting: Family Medicine

## 2016-06-17 DIAGNOSIS — I10 Essential (primary) hypertension: Secondary | ICD-10-CM

## 2016-06-18 NOTE — Telephone Encounter (Signed)
Patient has called requesting refill.

## 2016-07-04 ENCOUNTER — Other Ambulatory Visit: Payer: Self-pay | Admitting: Family Medicine

## 2016-07-04 DIAGNOSIS — G629 Polyneuropathy, unspecified: Secondary | ICD-10-CM

## 2016-07-04 DIAGNOSIS — G250 Essential tremor: Secondary | ICD-10-CM

## 2016-07-04 MED ORDER — TRAMADOL HCL 50 MG PO TABS: 50.0000 mg | ORAL_TABLET | Freq: Three times a day (TID) | ORAL | 0 refills | Status: DC | PRN

## 2016-07-04 NOTE — Telephone Encounter (Signed)
Could you check with patient to see how much Tramadol he has left? I have never seen him in office before, and would like to review this in an office visit to determine what we are treating and how often he is using it before making decision to prescribe it.  He may schedule office visit when ready.  [name], [facility] 07/04/2016, 9:08 AM

## 2016-07-04 NOTE — Telephone Encounter (Signed)
Requested refill on Tramadol, prescribed by prior PCP Dr Luan Pulling in 10/2015 as initial rx for variety of chronic pain issues especially DM neuropathy by chart review. I have not seen patient in office, refused initial refill and [facility] staff contacted patient today, he states almost out only has one left, and due for refills, he takes one pill 50mg  daily, and I agreed to a one month temporary rx to be faxed to [facility], and he will schedule office visit within 1 month to be seen to follow-up on this rx if need to extend it.  [name], [facility] 07/04/2016, 10:05 AM

## 2016-07-28 ENCOUNTER — Encounter: Payer: Self-pay | Admitting: Family Medicine

## 2016-07-28 ENCOUNTER — Ambulatory Visit (INDEPENDENT_AMBULATORY_CARE_PROVIDER_SITE_OTHER): Payer: Medicare HMO | Admitting: Family Medicine

## 2016-07-28 VITALS — BP 138/65 | HR 68 | Temp 97.8°F | Resp 16 | Ht 69.0 in | Wt 168.0 lb

## 2016-07-28 DIAGNOSIS — I10 Essential (primary) hypertension: Secondary | ICD-10-CM | POA: Diagnosis not present

## 2016-07-28 DIAGNOSIS — R42 Dizziness and giddiness: Secondary | ICD-10-CM

## 2016-07-28 DIAGNOSIS — I739 Peripheral vascular disease, unspecified: Secondary | ICD-10-CM | POA: Diagnosis not present

## 2016-07-28 DIAGNOSIS — M159 Polyosteoarthritis, unspecified: Secondary | ICD-10-CM | POA: Insufficient documentation

## 2016-07-28 DIAGNOSIS — M150 Primary generalized (osteo)arthritis: Secondary | ICD-10-CM | POA: Diagnosis not present

## 2016-07-28 DIAGNOSIS — G629 Polyneuropathy, unspecified: Secondary | ICD-10-CM

## 2016-07-28 MED ORDER — HYDROXYZINE HCL 10 MG PO TABS: 10.0000 mg | ORAL_TABLET | Freq: Three times a day (TID) | ORAL | 5 refills | Status: DC | PRN

## 2016-07-28 MED ORDER — TRAMADOL HCL 50 MG PO TABS: 50.0000 mg | ORAL_TABLET | Freq: Two times a day (BID) | ORAL | 2 refills | Status: DC | PRN

## 2016-07-28 MED ORDER — GABAPENTIN 100 MG PO CAPS: ORAL_CAPSULE | ORAL | 5 refills | Status: DC

## 2016-07-28 NOTE — Patient Instructions (Signed)
Thank you for coming to the clinic today.  1.  Refilled Tramadol 50mg , take once daily either morning or evening, and if you need to take twice a day that is fine, but you will run out sooner, notify us if need refill  2. Start taking 1 to 2 additional gabapentin pills in EVENING - Now take 2 gabapentin pills in MORNING and then 1 in evening, if need can take up to 2 in evening as well - This will help you sleep better  3. Also switch Hydroxyzine to EVENING, not morning -sent new prescription to [facility] for Hydroxyzine 10mg  - up to 3 times daily as needed on bottle, but prefer if you only take once daily in evening  4. Stop taking Meclizine everyday. ONLY take it if you need it. - If not improving vertigo let me know and we can schedule you to see the therapist again, because Meclizine does not FIX the vertigo, it can cause some side effects as well  Recommend to start taking Tylenol Extra Strength 500mg  tabs - take 1 to 2 tabs per dose (max 1000mg ) every 6-8 hours for pain (take regularly, don't skip a dose for next 7 days), max 24 hour daily dose is 6 tablets or 3000mg . In the future you can repeat the same everyday Tylenol course for 1-2 weeks at a time.  - This is safe to take with anti-inflammatory medicines (Ibuprofen, Advil, Naproxen, Aleve, Meloxicam, Mobic)  Please re consider allowing [place] to send a home health person out to evaluate you. Through [facility] or other agency.  Please schedule a follow-up appointment with Dr. Parks Ranger in 3 months for HTN, med refill, Pain follow-up  If you have any other questions or concerns, please feel free to call the clinic or send a message through [place]. You may also schedule an earlier appointment if necessary.  [name], DO [place]

## 2016-07-28 NOTE — Assessment & Plan Note (Signed)
Elevated BP, some minor improvement on manual re-check, today with acute pain, and unsure if took all meds today - Complicated by CKD-III-IV, followed by Nephro  Plan: 1. Continue current regimen - Metoprolol, Benazepril 2. No changes today - followed by Renal and Cardiology, will stay tuned for adjusting BP meds if needed 3. Monitor BP outside office, follow-up sooner if needed 4. Follow-up 3 months HTN

## 2016-07-28 NOTE — Assessment & Plan Note (Addendum)
Stable chronic problem, history of vertigo in past, some instability and dizziness intermittently still, likely multi-factorial with polypharmacy and sedating meds, also frailty in age 76 patient with significant vascular chronic conditions  Plan: 1. Discussion today on adjusting his med regimen since he seems to be taking all sedating meds and those with possible anticholinergic side effects - Refilled Hydroxyzine for anxiety, however patient had difficulty explaining this indication, reduced dose from 25 to 10mg  can use up to TID PRN or ideally shift morning dose to PM dose to help with sleep and anxiety as well - May continue Gabapentin in AM but prefer start or change to PM dose - May continue Meclizine PRN however advised patient prefer not to use this DAILY in AM, may be contributing to side effects and dizziness, if needs will need to follow-up with vestibular PT again

## 2016-07-28 NOTE — Assessment & Plan Note (Signed)
Chronic problem, followed by Vascular surgery, s/p prior stents, likely etiology for chronic lower extremity pain. - Continue current regimen - Continue gabapentin, Tramadol 

## 2016-07-28 NOTE — Assessment & Plan Note (Signed)
Stable chronic problem Continue Gabapentin, adjust dose, seems not improved at higher dose in past, 200-300mg  up to TID, now back down to just 200mg  in AM. Increase to add PM dose 200-300mg  PM may help with sleep at night as well

## 2016-07-28 NOTE — Assessment & Plan Note (Signed)
Chronic problem multiple joints, primarily neck and back, with chronic pain, limited relief with therapy - Refill Tramadol today 50mg  q 12 hr PRN #30, +2 refills, difficulty to determine if significant relief, but seems to help, in future can rx more if helping and if less sedating that other meds - Advised start regular Tylenol dosing up to 1g TID, avoid NSAIDs CKD - Increase Gabapentin 200mg  AM and 200-300pmg PM - Continue Baclofen regularly, caution with AM rx if sedation

## 2016-07-28 NOTE — Progress Notes (Addendum)
Subjective:    Patient ID: ARIAS WEINERT, male    DOB: 1941/01/30, 76 y.o.   MRN: [id number]  LEO FRAY is a 75 y.o. male presenting on 07/28/2016 for Hypertension   HPI   CHRONIC HTN: Reports no recent concerns, has had some elevated BP before, but not checking regularly at home. Current Meds - Benazepril 41m daily, Metoprolol 529mBID, Furosemide 4029mM and PRN PM   Reports good compliance, took meds today. Tolerating well, w/o complaints. Lifestyle - active but limited regular exercise due to chronic pain Denies CP, dyspnea, HA, edema, dizziness / lightheadedness  CKD III-IV / Chronic Peripheral Edema - Followed by Dr SinSherri Radd Vascular Dr DewLucky CowboyContinues on Furosemide 43m16m AM and PRN PM - Vascular Surgery Dr Dew Lucky Cowboyronic Pain / MSK Spasm / Neck / Neuropathy in Feet - Reports chronic history of pain multiple joints, especially chronic neck and back pain attributed to osteoarthritis among other conditions - Taking Baclofen 10mg42mly in AM, has not taken dose today - Taking Gabapentin 100mg 28mules x 2 in morning - Taking Tramadol 50mg i78mrning - Cannot take NSAIDs, not taking Tylenol regularly - Sometimes forgets taking medications at times, has persistent complaints of pain, despite current med regimen, but unsure if meds are helping  History of Vertigo / Anxiety - Prior history of vertigo BPPV with [facility], also he has been on chronic Meclizine PRN with improvement, but he does actually take it regularly. - Also history of some anxiety and problems with tremor or shaking and panic attacks, takes Hydroxyzine daily in AM, buspar 5mg [id number]) unsure how often he takes it. 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 - He does not ambulate with assistance devices often, but does sometimes use cane - [facility] or [facility] at home for med assistance   Social History  Substance Use Topics  . Smoking status: Former Smoker    Packs/day: 1.00    Years: 25.00    Types: Cigarettes    Quit date: 03/24/1985  . Smokeless tobacco: Never Used  . Alcohol use No    Review of Systems Per HPI unless specifically indicated above     Objective:    BP 138/65 (BP Location: Left Arm, Cuff Size: Normal)   Pulse 68   Temp 97.8 F (36.6 C) (Oral)   Resp 16   Ht 5' 9"  (1.753 m)   Wt 168 lb (76.2 kg)   BMI 24.81 kg/m   Wt Readings from Last 3 Encounters:  07/28/16 168 lb (76.2 kg)  04/28/16 160 lb (72.6 kg)  11/12/15 157 lb (71.2 kg)    Physical Exam  Constitutional: He is oriented to person, place, and time. He appears well-developed and well-nourished. No distress.  Chronically ill but currently well-appearing 76 year 39d male, comfortable, cooperative  HENT:  Head: Normocephalic and atraumatic.  Mouth/Throat: Oropharynx is clear and moist.  Eyes: Conjunctivae are normal. Right eye exhibits no discharge. Left eye exhibits no discharge.  Neck:  Neck Inspection / Palpation: some muscle asymmetry with posterior cervical and trapezius  spasm, palpable as well on exam, non tender. ROM: Mild reduced neck extension / flexion, preserved rotation Strength: distal upper extremity grip 5/5 bilateral Neurovascular: sensation intact upper extremity  Cardiovascular: Normal rate, regular rhythm, normal heart sounds and intact distal pulses.   No murmur heard. Pulmonary/Chest: Effort normal.  Musculoskeletal: He exhibits edema (Stable chronic bilateral +2 pitting edema lower leg ankle/feet, R>L).  Neurological: He is alert and oriented to person, place, and time.  Coordination normal.  Distal sensation to light touch is reduced Gait is cautious but appropriate, no assistance device available today, some difficulty from rising from seated to standing position  Skin: Skin is warm and dry. No rash noted. He is not diaphoretic. No erythema.  Psychiatric: He has a normal mood and affect. His behavior is normal.  Nursing note and vitals reviewed.  Results for orders placed or performed in visit on 04/28/16  COMPLETE METABOLIC PANEL WITH GFR  Result Value Ref Range   Sodium 142 135 - 146 mmol/L   Potassium 3.7 3.5 - 5.3 mmol/L   Chloride 106 98 - 110 mmol/L   CO2 29 20 - 31 mmol/L   Glucose, Bld 107 (H) 65 - 99 mg/dL   BUN 19 7 - 25 mg/dL   Creat 1.19 (H) 0.70 - 1.18 mg/dL   Total Bilirubin 0.5 0.2 - 1.2 mg/dL   Alkaline Phosphatase 68 40 - 115 U/L   AST 22 10 - 35 U/L   ALT 24 9 - 46 U/L   Total Protein 6.4 6.1 - 8.1 g/dL   Albumin 3.7 3.6 - 5.1 g/dL   Calcium 9.1 8.6 - 10.3 mg/dL   GFR, Est African American 68 >=60 mL/min   GFR, Est Non African American 59 (L) >=60 mL/min  CBC with Differential  Result Value Ref Range   WBC 5.2 3.8 - 10.8 K/uL   RBC 4.39 4.20 - 5.80 MIL/uL   Hemoglobin 12.8 (L) 13.2 - 17.1 g/dL   HCT 39.6 38.5 - 50.0 %   MCV 90.2 80.0 - 100.0 fL   MCH 29.2 27.0 - 33.0 pg   MCHC 32.3 32.0 - 36.0 g/dL   RDW 14.2 11.0 - 15.0 %   Platelets 155 140 - 400 K/uL   MPV 11.1 7.5 - 12.5 fL   Neutro Abs 2,756 1,500 - 7,800 cells/uL   Lymphs Abs 1,820 850 - 3,900 cells/uL   Monocytes Absolute 416 200 - 950 cells/uL   Eosinophils Absolute 156 15 - 500 cells/uL   Basophils Absolute 52 0 - 200 cells/uL   Neutrophils Relative % 53 %   Lymphocytes Relative 35 %   Monocytes Relative 8 %   Eosinophils Relative 3 %   Basophils Relative 1 %   Smear Review Criteria for review not met   Lipid Profile  Result Value Ref Range   Cholesterol 105 <200 mg/dL   Triglycerides 64 <150 mg/dL   HDL 31 (L) >40 mg/dL   Total CHOL/HDL Ratio 3.4  <5.0 Ratio   VLDL 13 <30 mg/dL   LDL Cholesterol 61 <100 mg/dL  HgB A1c  Result Value Ref Range   Hgb A1c MFr Bld 6.2 (H) <5.7 %   Mean Plasma Glucose 131 mg/dL      Assessment & Plan:   Problem List Items Addressed This Visit    Peripheral vascular disease ([place])    Chronic problem, followed by Vascular surgery, s/p prior stents, likely etiology for chronic lower extremity pain. - Continue current regimen - Continue  gabapentin, Tramadol      Osteoarthritis of multiple joints    Chronic problem multiple joints, primarily neck and back, with chronic pain, limited relief with therapy - Refill Tramadol today 66m q 12 hr PRN #30, +2 refills, difficulty to determine if significant relief, but seems to help, in future can rx more if helping and if less sedating that other meds - Advised start regular Tylenol dosing up to 1g TID, avoid NSAIDs CKD - Increase Gabapentin 2065mAM and 200-300pmg PM - Continue Baclofen regularly, caution with AM rx if sedation      Relevant Medications   hydrOXYzine (ATARAX/VISTARIL) 10 MG tablet   traMADol (ULTRAM) 50 MG tablet   Neuropathy    Stable chronic problem Continue Gabapentin, adjust dose, seems not improved at higher dose in past, 200-30041mp to TID, now back down to just 200m24m AM. Increase to add PM dose 200-300mg61mmay help with sleep at night as well      Relevant Medications   gabapentin (NEURONTIN) 100 MG capsule   traMADol (ULTRAM) 50 MG tablet   Hypertension - Primary    Elevated BP, some minor improvement on manual re-check, today with acute pain, and unsure if took all meds today - Complicated by CKD-III-IV, followed by Nephro  Plan: 1. Continue current regimen - Metoprolol, Benazepril 2. No changes today - followed by Renal and Cardiology, will stay tuned for adjusting BP meds if needed 3. Monitor BP outside office, follow-up sooner if needed 4. Follow-up 3 months HTN      Dizziness    Stable chronic problem, history of  vertigo in past, some instability and dizziness intermittently still, likely multi-factorial with polypharmacy and sedating meds, also frailty in age 64 pa48ent with significant vascular chronic conditions  Plan: 1. Discussion today on adjusting his med regimen since he seems to be taking all sedating meds and those with possible anticholinergic side effects - Refilled Hydroxyzine for anxiety, however patient had difficulty explaining this indication, reduced dose from 25 to 10mg 78muse up to TID PRN or ideally shift morning dose to PM dose to help with sleep and anxiety as well - May continue Gabapentin in AM but prefer start or change to PM dose - May continue Meclizine PRN however advised patient prefer not to use this DAILY in AM, may be contributing to side effects and dizziness, if needs will need to follow-up with vestibular PT again         Meds ordered this encounter  Medications  . hydrOXYzine (ATARAX/VISTARIL) 10 MG tablet    Sig: Take 1 tablet (10 mg total) by mouth 3 (three) times daily as needed.    Dispense:  30 tablet    Refill:  5  . gabapentin (NEURONTIN) 100 MG capsule    Sig: Take 2 capsules in AM and 1-2 capsules at bedtime    Dispense:  90 capsule    Refill:  5  . traMADol (ULTRAM) 50 MG tablet    Sig: Take 1 tablet (50 mg total) by mouth every 12 (twelve) hours as needed.    Dispense:  30 tablet    Refill:  2    Additionally requested that patient consider HH [facility] for any assistance needed at home and especially medication management to confirm current regimen, he has some difficulty getting to office due to chronic pain among other concerns. He declined this today.  Follow up plan: Return in about 3 months (around 10/28/2016) for blood pressure, med refills,  chronic pain.  [name], [facility] 07/28/2016, 10:04 PM

## 2016-08-12 ENCOUNTER — Other Ambulatory Visit: Payer: Self-pay

## 2016-08-12 DIAGNOSIS — R112 Nausea with vomiting, unspecified: Secondary | ICD-10-CM

## 2016-08-12 MED ORDER — ONDANSETRON 4 MG PO TBDP: 4.0000 mg | ORAL_TABLET | Freq: Three times a day (TID) | ORAL | 0 refills | Status: DC | PRN

## 2016-08-19 ENCOUNTER — Other Ambulatory Visit (INDEPENDENT_AMBULATORY_CARE_PROVIDER_SITE_OTHER): Payer: Self-pay | Admitting: Vascular Surgery

## 2016-08-19 DIAGNOSIS — I70219 Atherosclerosis of native arteries of extremities with intermittent claudication, unspecified extremity: Secondary | ICD-10-CM

## 2016-08-19 DIAGNOSIS — I714 Abdominal aortic aneurysm, without rupture, unspecified: Secondary | ICD-10-CM

## 2016-08-20 ENCOUNTER — Encounter (INDEPENDENT_AMBULATORY_CARE_PROVIDER_SITE_OTHER): Payer: Self-pay | Admitting: Vascular Surgery

## 2016-08-20 ENCOUNTER — Ambulatory Visit (INDEPENDENT_AMBULATORY_CARE_PROVIDER_SITE_OTHER): Payer: Medicare HMO | Admitting: Vascular Surgery

## 2016-08-20 ENCOUNTER — Ambulatory Visit (INDEPENDENT_AMBULATORY_CARE_PROVIDER_SITE_OTHER): Payer: Medicare HMO

## 2016-08-20 VITALS — BP 152/75 | HR 55 | Resp 16 | Wt 166.0 lb

## 2016-08-20 DIAGNOSIS — I70219 Atherosclerosis of native arteries of extremities with intermittent claudication, unspecified extremity: Secondary | ICD-10-CM

## 2016-08-20 DIAGNOSIS — I739 Peripheral vascular disease, unspecified: Secondary | ICD-10-CM

## 2016-08-20 DIAGNOSIS — I714 Abdominal aortic aneurysm, without rupture, unspecified: Secondary | ICD-10-CM

## 2016-08-20 DIAGNOSIS — I70213 Atherosclerosis of native arteries of extremities with intermittent claudication, bilateral legs: Secondary | ICD-10-CM | POA: Diagnosis not present

## 2016-08-20 NOTE — Progress Notes (Signed)
Subjective:    Patient ID: Randy Howard, male    DOB: 20-Oct-1940, 76 y.o.   MRN: [id number] Chief Complaint  Patient presents with  . Follow-up   Patient presents for a yearly PAD and AAA follow up. Patient continues to complain of bilateral lower extremity pain. Pain interferes with his ability to function on a daily basis. Pain worsens with activity. He underwent an aortic duplex which was notable for an abdominal aortic aneurysm measuring 2.94cm AP x 3.16cm transverse (previous 3.01cm on 07/05/14). He denies any symptoms such as back pain, pulsatile abdominal masses or thrombosis in his extremities. The patient underwent an ABI which showed bilateral moderate arterial disease (this is a decrease in ABI when compared to 06/05/15). Bilateral lower extremity arterial duplex with monophasic flow. When compared to 06/05/15 there has been a deterioration in arterial blood flow to the lower extremity. The patient denies rest pain or ulcers to his feet / toes.    Review of Systems  Constitutional: Negative.   HENT: Negative.   Eyes: Negative.   Respiratory: Negative.   Cardiovascular:       Bilateral Lower Extremity Pain  Gastrointestinal: Negative.   Endocrine: Negative.   Genitourinary: Negative.   Musculoskeletal: Negative.   Skin: Negative.   Allergic/Immunologic: Negative.   Neurological: Negative.   Hematological: Negative.   Psychiatric/Behavioral: Negative.       Objective:   Physical Exam  Constitutional: He is oriented to person, place, and time. He appears well-developed and well-nourished. No distress.  HENT:  Head: Normocephalic and atraumatic.  Eyes: Conjunctivae are normal. Pupils are equal, round, and reactive to light.  Neck: Normal range of motion.  Cardiovascular: Normal rate, regular rhythm and normal heart sounds.   Pulses:      Radial pulses are 2+ on the right side, and 2+ on the left side.       Dorsalis pedis pulses are 0 on the right side, and 0 on the left  side.       Posterior tibial pulses are 0 on the right side, and 0 on the left side.  Pulmonary/Chest: Effort normal.  Musculoskeletal: Normal range of motion. He exhibits no edema.  Neurological: He is alert and oriented to person, place, and time.  Skin: Skin is warm and dry. He is not diaphoretic.  Psychiatric: He has a normal mood and affect. His behavior is normal. Judgment and thought content normal.  Vitals reviewed.  BP (!) 152/75   Pulse (!) 55   Resp 16   Wt 166 lb (75.3 kg)   BMI 24.51 kg/m   Past Medical History:  Diagnosis Date  . Arthritis   . Coronary artery disease   . Diabetes type 2, controlled ([place])   . Elevated lipids   . Hyperlipidemia   . Hypertension   . Neuropathy   . Nocturia   . Obstructive sleep apnea   . Peripheral vascular disease ([place])   . Prostate enlargement   . PVD (peripheral vascular disease) ([place])   . Right carotid bruit   . Skin cancer    Followed by Dr. Evorn Gong  . Skin cancer   . Tremor    Social History   Social History  . Marital status: Widowed    Spouse name: N/A  . Number of children: N/A  . Years of education: N/A   Occupational History  . Not on file.   Social History Main Topics  . Smoking status: Former Smoker    Packs/day:  1.00    Years: 25.00    Types: Cigarettes    Quit date: 03/24/1985  . Smokeless tobacco: Never Used  . Alcohol use No  . Drug use: No  . Sexual activity: Not on file   Other Topics Concern  . Not on file   Social History Narrative  . No narrative on file   Past Surgical History:  Procedure Laterality Date  . CARDIAC CATHETERIZATION    . CAROTID ARTERY ANGIOPLASTY Left 09/30/2011  . CORONARY ARTERY BYPASS GRAFT  2006  . ENDARTERECTOMY FEMORAL Bilateral 11/01/2014   Procedure: ENDARTERECTOMY FEMORAL;  Surgeon: Algernon Huxley, MD;  Location: [facility];  Service: Vascular;  Laterality: Bilateral;  . PERIPHERAL VASCULAR CATHETERIZATION Left 10/09/2014   Procedure: Lower Extremity Angiography;   Surgeon: Algernon Huxley, MD;  Location: [facility];  Service: Cardiovascular;  Laterality: Left;  . PERIPHERAL VASCULAR CATHETERIZATION  10/09/2014   Procedure: Lower Extremity Intervention;  Surgeon: Algernon Huxley, MD;  Location: [facility];  Service: Cardiovascular;;   Family History  Problem Relation Age of Onset  . Heart disease Sister   . Heart disease Sister   . Diabetes Sister   . Diabetes Sister   . Diabetes Brother    Allergies  Allergen Reactions  . Oxycodone Nausea Only       Assessment & Plan:  Patient presents for a yearly PAD and AAA follow up. Patient continues to complain of bilateral lower extremity pain. Pain interferes with his ability to function on a daily basis. Pain worsens with activity. He underwent an aortic duplex which was notable for an abdominal aortic aneurysm measuring 2.94cm AP x 3.16cm transverse (previous 3.01cm on 07/05/14). He denies any symptoms such as back pain, pulsatile abdominal masses or thrombosis in his extremities. The patient underwent an ABI which showed bilateral moderate arterial disease (this is a decrease in ABI when compared to 06/05/15). Bilateral lower extremity arterial duplex with monophasic flow. When compared to 06/05/15 there has been a deterioration in arterial blood flow to the lower extremity. The patient denies rest pain or ulcers to his feet / toes.   1. Peripheral vascular disease (HCC) - Worsening Decrease in both ABI and arterial duplex. Patient symptomatic to the point his pain interferes with his ability to function on a daily basis.  Recommended bilateral lower extremity angiograms. I spent over 20 minutes with the patient explaining the pathophysiology of arterial disease, treatment both medically and endovascularly.  Patient does not want to move forward until he speaks to someone "medically qualified" to make this decision.  Rescheduled the patient to come back to speak to Dr. Lucky Cowboy.  2. Atherosclerosis  of native artery of both lower extremities with intermittent claudication ([place]) - Worsening As above.   3. AAA - Stable Duplex stable, physical exam unremarkable. No surgery or intervention at this time. The patient to follow up in XX months with an aortic duplex. The patient has an asymptomatic abdominal aortic aneurysm that is less than 4 cm in maximal diameter.  I have reviewed the natural history of abdominal aortic aneurysm and the small risk of rupture for aneurysm less than 5 cm in size.  However, as these small aneurysms tend to enlarge over time, continued surveillance with ultrasound or CT scan is mandatory.  The patient's blood pressure is being adequately controlled however I have reviewed the importance of hypertension and lipid control and the importance of continuing his abstinence from tobacco.  The patient is  also encouraged to exercise a minimum of 30 minutes 4 times a week.  Should the patient develop new onset abdominal or back pain or signs of peripheral embolization they are instructed to seek medical attention immediately and to alert the physician providing care that they have an aneurysm.  The patient voices their understanding.  Current Outpatient Prescriptions on File Prior to Visit  Medication Sig Dispense Refill  . aspirin 81 MG tablet Take 81 mg by mouth daily.    . baclofen (LIORESAL) 10 MG tablet TAKE 1 TABLET BY MOUTH 3 TIMES DAILY AS NEEDED. 60 each 2  . benazepril (LOTENSIN) 40 MG tablet TAKE 1 TABLET BY MOUTH ONCE DAILY 90 tablet 3  . busPIRone (BUSPAR) 5 MG tablet Take 1 tablet (5 mg total) by mouth 2 (two) times daily. (Patient taking differently: Take 5 mg by mouth 2 (two) times daily as needed. ) 60 tablet 3  . clopidogrel (PLAVIX) 75 MG tablet Take 1 tablet (75 mg total) by mouth daily. 90 tablet 3  . etodolac (LODINE) 300 MG capsule TAKE 1 CAPSULE BY MOUTH TWICE DAILY AS NEEDED. 60 capsule 6  . finasteride (PROSCAR) 5 MG tablet TAKE 1 TABLET BY MOUTH ONCE  DAILY. 90 tablet 3  . furosemide (LASIX) 40 MG tablet TAKE 2 TABLETS BY MOUTH TWICE DAILY (MORNING AND EARLY AFTERNOON) (Patient taking differently: TAKE 2 TABLETS BY MOUTH TWICE DAILY (MORNING AND EARLY AFTERNOON) as needed) 120 tablet 3  . gabapentin (NEURONTIN) 100 MG capsule Take 2 capsules in AM and 1-2 capsules at bedtime 90 capsule 5  . glimepiride (AMARYL) 4 MG tablet Take 1 tablet each morning 180 tablet 3  . hydrOXYzine (ATARAX/VISTARIL) 10 MG tablet Take 1 tablet (10 mg total) by mouth 3 (three) times daily as needed. 30 tablet 5  . meclizine (ANTIVERT) 25 MG tablet TAKE 1 TABLET BY MOUTH 3 TIMES DAILY AS NEEDED DIZZINESS 60 tablet 6  . metoprolol (LOPRESSOR) 50 MG tablet TAKE 1 TABLET BY MOUTH TWICE DAILY 180 tablet 3  . nitroGLYCERIN (NITROSTAT) 0.4 MG SL tablet Place 1 tablet (0.4 mg total) under the tongue every 5 (five) minutes as needed for chest pain. 50 tablet 3  . ondansetron (ZOFRAN ODT) 4 MG disintegrating tablet Take 1 tablet (4 mg total) by mouth every 8 (eight) hours as needed for nausea or vomiting. 20 tablet 0  . potassium chloride (K-DUR) 10 MEQ tablet Take 1 tablet (10 mEq total) by mouth daily. 30 tablet 6  . tamsulosin (FLOMAX) 0.4 MG CAPS capsule Take 1 capsule (0.4 mg total) by mouth daily. 90 capsule 3  . traMADol (ULTRAM) 50 MG tablet Take 1 tablet (50 mg total) by mouth every 12 (twelve) hours as needed. 30 tablet 2   No current facility-administered medications on file prior to visit.    There are no Patient Instructions on file for this visit. No Follow-up on file.  Glorianne Proctor A Cloe Sockwell, PA-C

## 2016-09-12 ENCOUNTER — Ambulatory Visit (INDEPENDENT_AMBULATORY_CARE_PROVIDER_SITE_OTHER): Payer: Medicare HMO | Admitting: Vascular Surgery

## 2016-09-18 ENCOUNTER — Other Ambulatory Visit: Payer: Self-pay

## 2016-09-18 MED ORDER — TAMSULOSIN HCL 0.4 MG PO CAPS: 0.4000 mg | ORAL_CAPSULE | Freq: Every day | ORAL | 0 refills | Status: DC

## 2016-10-04 ENCOUNTER — Other Ambulatory Visit: Payer: Self-pay | Admitting: Family Medicine

## 2016-10-04 DIAGNOSIS — M159 Polyosteoarthritis, unspecified: Secondary | ICD-10-CM

## 2016-10-04 DIAGNOSIS — G629 Polyneuropathy, unspecified: Secondary | ICD-10-CM

## 2016-10-06 NOTE — Telephone Encounter (Signed)
Last visit with me 07/28/16, refilled Tramadol at that time, he was asked to return in about 3 months. He is requesting refill Tramadol at this time. Phoned in refill to [facility], Tramadol 50mg  tabs #30 +2 refills for another 3 month supply. We can review this at his upcoming apt in August.  Philena Obey, [facility] 10/06/2016, 11:42 AM

## 2016-10-08 ENCOUNTER — Other Ambulatory Visit: Payer: Self-pay | Admitting: Family Medicine

## 2016-10-08 DIAGNOSIS — I739 Peripheral vascular disease, unspecified: Secondary | ICD-10-CM

## 2016-10-08 DIAGNOSIS — I70213 Atherosclerosis of native arteries of extremities with intermittent claudication, bilateral legs: Secondary | ICD-10-CM

## 2016-10-08 MED ORDER — CLOPIDOGREL BISULFATE 75 MG PO TABS: 75.0000 mg | ORAL_TABLET | Freq: Every day | ORAL | 3 refills | Status: DC

## 2016-10-30 ENCOUNTER — Other Ambulatory Visit: Payer: Self-pay

## 2016-10-30 DIAGNOSIS — F419 Anxiety disorder, unspecified: Secondary | ICD-10-CM

## 2016-10-30 DIAGNOSIS — I2510 Atherosclerotic heart disease of native coronary artery without angina pectoris: Secondary | ICD-10-CM

## 2016-10-30 MED ORDER — METOPROLOL TARTRATE 50 MG PO TABS: 50.0000 mg | ORAL_TABLET | Freq: Two times a day (BID) | ORAL | 3 refills | Status: DC

## 2016-10-30 MED ORDER — BUSPIRONE HCL 5 MG PO TABS: 5.0000 mg | ORAL_TABLET | Freq: Two times a day (BID) | ORAL | 3 refills | Status: DC | PRN

## 2016-11-03 ENCOUNTER — Ambulatory Visit: Payer: Medicare HMO | Admitting: Family Medicine

## 2016-11-12 ENCOUNTER — Encounter: Payer: Self-pay | Admitting: Family Medicine

## 2016-11-12 ENCOUNTER — Ambulatory Visit (INDEPENDENT_AMBULATORY_CARE_PROVIDER_SITE_OTHER): Payer: Medicare HMO | Admitting: Family Medicine

## 2016-11-12 VITALS — BP 166/58 | HR 55 | Temp 98.1°F | Resp 16 | Ht 69.0 in | Wt 169.4 lb

## 2016-11-12 DIAGNOSIS — F5101 Primary insomnia: Secondary | ICD-10-CM

## 2016-11-12 DIAGNOSIS — I70213 Atherosclerosis of native arteries of extremities with intermittent claudication, bilateral legs: Secondary | ICD-10-CM | POA: Diagnosis not present

## 2016-11-12 DIAGNOSIS — I872 Venous insufficiency (chronic) (peripheral): Secondary | ICD-10-CM

## 2016-11-12 DIAGNOSIS — M150 Primary generalized (osteo)arthritis: Secondary | ICD-10-CM | POA: Diagnosis not present

## 2016-11-12 DIAGNOSIS — I739 Peripheral vascular disease, unspecified: Secondary | ICD-10-CM

## 2016-11-12 DIAGNOSIS — I10 Essential (primary) hypertension: Secondary | ICD-10-CM

## 2016-11-12 DIAGNOSIS — G629 Polyneuropathy, unspecified: Secondary | ICD-10-CM

## 2016-11-12 DIAGNOSIS — M159 Polyosteoarthritis, unspecified: Secondary | ICD-10-CM

## 2016-11-12 DIAGNOSIS — R634 Abnormal weight loss: Secondary | ICD-10-CM | POA: Diagnosis not present

## 2016-11-12 DIAGNOSIS — E1142 Type 2 diabetes mellitus with diabetic polyneuropathy: Secondary | ICD-10-CM

## 2016-11-12 DIAGNOSIS — G4700 Insomnia, unspecified: Secondary | ICD-10-CM | POA: Insufficient documentation

## 2016-11-12 DIAGNOSIS — R69 Illness, unspecified: Secondary | ICD-10-CM | POA: Diagnosis not present

## 2016-11-12 LAB — POCT GLYCOSYLATED HEMOGLOBIN (HGB A1C): Hemoglobin A1C: 6.7 — AB (ref ?–5.7)

## 2016-11-12 MED ORDER — BACLOFEN 10 MG PO TABS: 10.0000 mg | ORAL_TABLET | Freq: Three times a day (TID) | ORAL | 3 refills | Status: DC | PRN

## 2016-11-12 MED ORDER — MIRTAZAPINE 15 MG PO TABS: 15.0000 mg | ORAL_TABLET | Freq: Every day | ORAL | 5 refills | Status: DC

## 2016-11-12 MED ORDER — TRAMADOL HCL 50 MG PO TABS: 50.0000 mg | ORAL_TABLET | Freq: Two times a day (BID) | ORAL | 2 refills | Status: DC | PRN

## 2016-11-12 NOTE — Progress Notes (Signed)
Subjective:    Patient ID: Randy Howard, male    DOB: 22-Nov-1940, 76 y.o.   MRN: [id number]  Randy Howard is a 76 y.o. male presenting on 11/12/2016 for Hypertension (medication refill)   HPI   CHRONIC HTN: Reports no new concern. History of persistent elevated BP. Still not checking at home. Current Meds - Benazepril 40mg  daily, Metoprolol 50mg  BID, Furosemide 40mg  AM and PRN PM   Reports good compliance, took meds today. Tolerating well, w/o complaints. Lifestyle - remains active (not able to participate in as many games as he used to at senior center) but limited regular exercise due to chronic pain Denies CP, dyspnea, HA, edema, dizziness / lightheadedness  CKD III-IV / Chronic Peripheral Edema - Followed by Dr Sherri Rad and Vascular Dr Lucky Cowboy - Continues on Furosemide 40mg  in AM and PRN PM - Vascular Surgery follow-up in near future, as he already has multiple stents in legs and may be scheduled to have another evaluation and management, last in 07/2016 with reduced ABI and arterial duplex, would likely need angiograms - Wearing compression socks - He admits some toenails dark - Asking about Podiatry, would like to return to Dr Cleda Mccreedy Podiatry  CHRONIC DM, Type 2: No new concerns. Meds: Glimepiride 4mg  daily in AM Reports good compliance. Tolerating well w/o side-effects Currently on ACEi Denies hypoglycemia, polyuria, visual changes, numbness or tingling.  Chronic Pain / MSK Spasm / Neck / Neuropathy in Feet - Reports chronic history of pain multiple joints, especially chronic neck and back pain secondary to OA/DJD - Taking Baclofen 10mg  daily in AM - request refill - Taking Gabapentin 100mg  capsules x 2 in morning - has not increased dose, would like to stay at this dose and request refill - Taking Tramadol 50mg  in morning - doing well on this med still, seems to help, not tried increasing it, recent refill was phoned in but would like paper to have on file - Cannot take  NSAIDs, not taking Tylenol regularly - Sometimes forgets taking medications at times still, but overall has many complaints of pain still. Declined HH last visit.  WEIGHT LOSS / INSMONIA: - Reports concern with some unintentional wt loss, some poor appetite, 190 to 150s, now up 169 - Drinking 2-4 boost supplements at times - More difficulty functioning and standing, participating in games at senior center - previously offered Zoloft in past he declined, he tried Xanax in past, no longer taking - Admits insomnia with difficulty falling asleep, unsure if due to pain or other - tried Trazodone in past, had problem with making him too sedated, no longer wanted to take that  Depression screen [facility] 2/9 11/12/2016 04/28/2016 11/05/2015  Decreased Interest 1 0 0  Down, Depressed, Hopeless 2 0 0  PHQ - 2 Score 3 0 0  Altered sleeping 3 - -  Tired, decreased energy 3 - -  Change in appetite 3 - -  Feeling bad or failure about yourself  0 - -  Trouble concentrating 2 - -  Moving slowly or fidgety/restless 1 - -  Suicidal thoughts 0 - -  PHQ-9 Score 15 - -  Difficult doing work/chores Somewhat difficult - -   No flowsheet data found.   Social History  Substance Use Topics  . Smoking status: Former Smoker    Packs/day: 1.00    Years: 25.00    Types: Cigarettes    Quit date: 03/24/1985  . Smokeless tobacco: Never Used  . Alcohol use No  Review of Systems Per HPI unless specifically indicated above     Objective:    BP (!) 166/58   Pulse (!) 55   Temp 98.1 F (36.7 C) (Oral)   Resp 16   Ht 5\' 9"  (1.753 m)   Wt 169 lb 6.4 oz (76.8 kg)   BMI 25.02 kg/m   Wt Readings from Last 3 Encounters:  11/12/16 169 lb 6.4 oz (76.8 kg)  08/20/16 166 lb (75.3 kg)  07/28/16 168 lb (76.2 kg)    Physical Exam  Constitutional: He is oriented to person, place, and time. He appears well-developed and well-nourished. No distress.  Chronically ill but currently well-appearing 76 year old male,  comfortable, cooperative  HENT:  Head: Normocephalic and atraumatic.  Mouth/Throat: Oropharynx is clear and moist.  Eyes: Conjunctivae are normal. Right eye exhibits no discharge. Left eye exhibits no discharge.  Neck:  Neck Inspection / Palpation: persistent mostly unchanged muscle hypertonicity with posterior cervical and trapezius spasm, palpable on exam, non tender. ROM: Mild reduced neck extension / flexion, preserved rotation Strength: distal upper extremity grip 5/5 bilateral Neurovascular: sensation intact upper extremity  Cardiovascular: Normal rate, regular rhythm, normal heart sounds and intact distal pulses.   No murmur heard. Pulmonary/Chest: Effort normal.  Musculoskeletal: He exhibits edema (Stable chronic bilateral +2 pitting edema lower leg ankle/feet, R>L).  Neurological: He is alert and oriented to person, place, and time. Coordination normal.  Distal sensation to light touch is reduced Gait is cautious but appropriate, has cane today, some difficulty from rising from seated to standing position  Skin: Skin is warm and dry. No rash noted. He is not diaphoretic. No erythema.  Psychiatric: He has a normal mood and affect. His behavior is normal.  Nursing note and vitals reviewed.  Diabetic Foot Exam - Simple   Simple Foot Form Diabetic Foot exam was performed with the following findings:  Yes 11/12/2016  2:25 PM  Visual Inspection See comments:  Yes Sensation Testing See comments:  Yes Pulse Check Posterior Tibialis and Dorsalis pulse intact bilaterally:  Yes Comments Bilateral feet with diminished monofilament sensation, L great toe with healing blister and skin abrasion callus with some residual erythema not extending, mild tender. Toenails on both sides thicker and prior loss of toenails     Results for orders placed or performed in visit on 11/12/16  POCT HgB A1C  Result Value Ref Range   Hemoglobin A1C 6.7 (A) 5.7    Recent Labs  05/09/16 0001  11/12/16 1834  HGBA1C 6.2* 6.7*        Assessment & Plan:   Problem List Items Addressed This Visit    Weight loss    Unintentional wt loss, likely in setting of multiple chronic comorbidities Start mirtazapine for inc wt gain, appetite      Peripheral vascular disease ([place])    Chronic problem, followed by Vascular surgery, s/p prior stents, likely etiology for chronic lower extremity pain. - Continue current regimen - Continue gabapentin, Tramadol      Osteoarthritis of multiple joints    Chronic problem multiple joints, primarily neck and back, with chronic pain, limited relief with therapy - Refill Tramadol today 50mg  q 12 hr PRN #30, +2 refills,  - seems still helping, recently phoned in 09/2016, printed to give him next 3 month script when ready for it, offered to inc amt but he declines - Advised start regular Tylenol dosing up to 1g TID, avoid NSAIDs CKD - Increase Gabapentin 200mg  AM and 200-300pmg  PM - Continue Baclofen regularly if helping, he does not have meds today      Relevant Medications   baclofen (LIORESAL) 10 MG tablet   traMADol (ULTRAM) 50 MG tablet   Neuropathy    Stable chronic problem Continue Gabapentin, may adjust dose, patient did not seem to make change, uncertain if he did or not      Relevant Medications   traMADol (ULTRAM) 50 MG tablet   Insomnia    Start new Mirtazapine 15mg  nightly, help appetite and insomnia      Relevant Medications   mirtazapine (REMERON) 15 MG tablet   Hypertension    Still elevated BP, still with pain today - Complicated by CKD-III-IV, followed by Nephro / PAD/CHF vascular surgery & Cardiology  Plan: 1. Continue current regimen - Metoprolol, Benazepril, Furosemide 2. Need to monitor BP outside office, follow-up sooner if needed 3. Follow-up 3 months HTN      Diabetes mellitus ([place]) - Primary    Well-controlled DM with A1c 6.7 (mild inc from 6.2 but still at goal, for chronically ill 20 yr  patient) Complications with CKD, neuropathy, PAD No hypoglycemia  Plan:  1. Continue current therapy - Glimepiride 4mg  daily AM - caution with hypoglycemia, reviewed warning 2. Encourage improved lifestyle - now concern with some wt loss, agree with boost may try glucerna, continue to work on regular meals (small frequent if needed) 3. DM Foot exam done today / Advised to schedule DM ophtho exam, send record 4. Follow-up 3 months DM A1c - may adjust sulfonylurea in future, concern with further wt loss, may DC all DM meds      Relevant Orders   POCT HgB A1C (Completed)   Chronic venous insufficiency    Underlying etiology for chronic LE edema Adhering to some compression but limited elevation at times Limited activity Improved on lasix but chronic problem      Atherosclerosis of native artery of both lower extremities with intermittent claudication (HCC)    Chronic problem with known PAD and claudication among other co morbidities contributing to his chronic LE pain and limited function Followed by Vascular Surgery, awaiting further work-up with some reduced ABI/arterial duplex         Meds ordered this encounter  Medications  . baclofen (LIORESAL) 10 MG tablet    Sig: Take 1 tablet (10 mg total) by mouth 3 (three) times daily as needed.    Dispense:  90 each    Refill:  3  . traMADol (ULTRAM) 50 MG tablet    Sig: Take 1 tablet (50 mg total) by mouth every 12 (twelve) hours as needed for moderate pain.    Dispense:  30 tablet    Refill:  2  . mirtazapine (REMERON) 15 MG tablet    Sig: Take 1 tablet (15 mg total) by mouth at bedtime.    Dispense:  30 tablet    Refill:  5    Again requested that patient consider [place] evaluation for any assistance needed at home and especially medication management to confirm current regimen, he has some difficulty getting to office due to chronic pain among other concerns. He declined this for second time. He is defensive and refuses additional  help at home.  Follow up plan: Return in about 3 months (around 02/12/2017) for blood pressure, neuropathy/pain, insomnia.  [name], [facility] 11/13/2016, 10:02 PM

## 2016-11-12 NOTE — Patient Instructions (Addendum)
Thank you for coming to the clinic today.  1.  Call to schedule to see Dr Cleda Mccreedy for [facility] [place], [place] [address] Hours - M-F 8-5 Phone: [phone] phone  If worsening toe problems redness swelling, infection, fever/chills or worsening, please seek immediate attention at hospital Emergency Department  2. Follow-up with Dr Evorn Gong for early skin cancer abnormal spots as discussed  3. Printed Tramadol refill, you already have 2 refills on file  4. Start new medicine Mirtazapine 15mg  nightly to help sleep and help improve appetite   Please schedule a Follow-up Appointment to: Return in about 3 months (around 02/12/2017) for blood pressure, neuropathy/pain, insomnia.  If you have any other questions or concerns, please feel free to call the clinic or send a message through [place]. You may also schedule an earlier appointment if necessary.  Additionally, you may be receiving a survey about your experience at our clinic within a few days to 1 week by e-mail or mail. We value your feedback.  [name], DO [place]

## 2016-11-13 NOTE — Assessment & Plan Note (Signed)
Chronic problem multiple joints, primarily neck and back, with chronic pain, limited relief with therapy - Refill Tramadol today 50mg  q 12 hr PRN #30, +2 refills,  - seems still helping, recently phoned in 09/2016, printed to give him next 3 month script when ready for it, offered to inc amt but he declines - Advised start regular Tylenol dosing up to 1g TID, avoid NSAIDs CKD - Increase Gabapentin 200mg  AM and 200-300pmg PM - Continue Baclofen regularly if helping, he does not have meds today

## 2016-11-13 NOTE — Assessment & Plan Note (Signed)
Underlying etiology for chronic LE edema Adhering to some compression but limited elevation at times Limited activity Improved on lasix but chronic problem

## 2016-11-13 NOTE — Assessment & Plan Note (Signed)
Start new Mirtazapine 15mg  nightly, help appetite and insomnia

## 2016-11-13 NOTE — Assessment & Plan Note (Signed)
Unintentional wt loss, likely in setting of multiple chronic comorbidities Start mirtazapine for inc wt gain, appetite

## 2016-11-13 NOTE — Assessment & Plan Note (Signed)
Stable chronic problem Continue Gabapentin, may adjust dose, patient did not seem to make change, uncertain if he did or not

## 2016-11-13 NOTE — Assessment & Plan Note (Signed)
Still elevated BP, still with pain today - Complicated by CKD-III-IV, followed by Nephro / PAD/CHF vascular surgery & Cardiology  Plan: 1. Continue current regimen - Metoprolol, Benazepril, Furosemide 2. Need to monitor BP outside office, follow-up sooner if needed 3. Follow-up 3 months HTN

## 2016-11-13 NOTE — Assessment & Plan Note (Signed)
Chronic problem with known PAD and claudication among other co morbidities contributing to his chronic LE pain and limited function Followed by Vascular Surgery, awaiting further work-up with some reduced ABI/arterial duplex

## 2016-11-13 NOTE — Assessment & Plan Note (Signed)
Chronic problem, followed by Vascular surgery, s/p prior stents, likely etiology for chronic lower extremity pain. - Continue current regimen - Continue gabapentin, Tramadol

## 2016-11-13 NOTE — Assessment & Plan Note (Signed)
Well-controlled DM with A1c 6.7 (mild inc from 6.2 but still at goal, for chronically ill 76 yr patient) Complications with CKD, neuropathy, PAD No hypoglycemia  Plan:  1. Continue current therapy - Glimepiride 4mg  daily AM - caution with hypoglycemia, reviewed warning 2. Encourage improved lifestyle - now concern with some wt loss, agree with boost may try glucerna, continue to work on regular meals (small frequent if needed) 3. DM Foot exam done today / Advised to schedule DM ophtho exam, send record 4. Follow-up 3 months DM A1c - may adjust sulfonylurea in future, concern with further wt loss, may DC all DM meds

## 2016-11-18 ENCOUNTER — Other Ambulatory Visit: Payer: Self-pay

## 2016-11-18 DIAGNOSIS — G629 Polyneuropathy, unspecified: Secondary | ICD-10-CM

## 2016-11-18 MED ORDER — GABAPENTIN 100 MG PO CAPS: ORAL_CAPSULE | ORAL | 5 refills | Status: DC

## 2016-12-22 ENCOUNTER — Other Ambulatory Visit: Payer: Self-pay | Admitting: Family Medicine

## 2016-12-22 DIAGNOSIS — M150 Primary generalized (osteo)arthritis: Secondary | ICD-10-CM

## 2016-12-22 DIAGNOSIS — G4700 Insomnia, unspecified: Secondary | ICD-10-CM

## 2016-12-22 DIAGNOSIS — F419 Anxiety disorder, unspecified: Secondary | ICD-10-CM

## 2016-12-22 DIAGNOSIS — M159 Polyosteoarthritis, unspecified: Secondary | ICD-10-CM

## 2016-12-22 DIAGNOSIS — N401 Enlarged prostate with lower urinary tract symptoms: Secondary | ICD-10-CM

## 2016-12-23 ENCOUNTER — Other Ambulatory Visit: Payer: Self-pay

## 2016-12-29 ENCOUNTER — Encounter: Payer: Self-pay | Admitting: Family Medicine

## 2016-12-29 ENCOUNTER — Other Ambulatory Visit: Payer: Self-pay

## 2016-12-29 DIAGNOSIS — N400 Enlarged prostate without lower urinary tract symptoms: Secondary | ICD-10-CM | POA: Insufficient documentation

## 2016-12-29 MED ORDER — FINASTERIDE 5 MG PO TABS: 5.0000 mg | ORAL_TABLET | Freq: Every day | ORAL | 1 refills | Status: DC

## 2016-12-29 NOTE — Telephone Encounter (Signed)
Decline Etodolac since patient cannot take NSAIDs. This is 2nd request denied for this med. Last was 12/23/16

## 2016-12-30 ENCOUNTER — Telehealth: Payer: Self-pay | Admitting: Family Medicine

## 2017-01-01 ENCOUNTER — Telehealth: Payer: Self-pay | Admitting: Family Medicine

## 2017-01-01 ENCOUNTER — Other Ambulatory Visit: Payer: Self-pay

## 2017-01-01 DIAGNOSIS — M159 Polyosteoarthritis, unspecified: Secondary | ICD-10-CM

## 2017-01-01 NOTE — Telephone Encounter (Signed)
Pt needs a new prescription for etololac sent to [facility]

## 2017-02-11 ENCOUNTER — Ambulatory Visit: Payer: Medicare HMO | Admitting: Family Medicine

## 2017-02-11 NOTE — Telephone Encounter (Signed)
UPDATE 02/11/17 Again received multiple requests refill Etolodac from patient, despite denied due to avoiding NSAID in this patient. He has history of CKD, more recently has dramatically improved since last check 04/2016.  He told me prior visit that he cannot take NSAIDs, and I agree with plan to avoid these to avoid future damage to kidneys.  [facility] staff called patient to review with him. Now he is adamant that he does not have kidney problems and wants to take this medicine for his arthritis and chronic joint pain.  I still would recommend he follow-up in office to review medications and side effects and risks before resuming this therapy. He is due for follow-up.  [name], DO [facility] 02/11/2017, 5:22 PM

## 2017-02-17 ENCOUNTER — Other Ambulatory Visit: Payer: Self-pay | Admitting: Family Medicine

## 2017-02-17 DIAGNOSIS — M159 Polyosteoarthritis, unspecified: Secondary | ICD-10-CM

## 2017-02-17 DIAGNOSIS — G629 Polyneuropathy, unspecified: Secondary | ICD-10-CM

## 2017-02-17 DIAGNOSIS — M150 Primary generalized (osteo)arthritis: Secondary | ICD-10-CM

## 2017-02-20 ENCOUNTER — Other Ambulatory Visit: Payer: Self-pay

## 2017-02-20 DIAGNOSIS — E0800 Diabetes mellitus due to underlying condition with hyperosmolarity without nonketotic hyperglycemic-hyperosmolar coma (NKHHC): Secondary | ICD-10-CM

## 2017-02-20 MED ORDER — GLIMEPIRIDE 4 MG PO TABS: ORAL_TABLET | ORAL | 0 refills | Status: DC

## 2017-03-02 ENCOUNTER — Ambulatory Visit: Payer: Self-pay | Admitting: Family Medicine

## 2017-03-12 ENCOUNTER — Other Ambulatory Visit: Payer: Self-pay

## 2017-03-12 ENCOUNTER — Other Ambulatory Visit: Payer: Self-pay | Admitting: Family Medicine

## 2017-03-12 DIAGNOSIS — G4700 Insomnia, unspecified: Secondary | ICD-10-CM

## 2017-03-12 DIAGNOSIS — I5022 Chronic systolic (congestive) heart failure: Secondary | ICD-10-CM

## 2017-03-12 DIAGNOSIS — F419 Anxiety disorder, unspecified: Secondary | ICD-10-CM

## 2017-03-12 DIAGNOSIS — R600 Localized edema: Secondary | ICD-10-CM

## 2017-03-13 MED ORDER — FUROSEMIDE 40 MG PO TABS: ORAL_TABLET | ORAL | 3 refills | Status: DC

## 2017-03-20 ENCOUNTER — Telehealth: Payer: Self-pay | Admitting: Family Medicine

## 2017-03-20 DIAGNOSIS — G629 Polyneuropathy, unspecified: Secondary | ICD-10-CM

## 2017-03-20 DIAGNOSIS — M150 Primary generalized (osteo)arthritis: Secondary | ICD-10-CM

## 2017-03-20 DIAGNOSIS — M159 Polyosteoarthritis, unspecified: Secondary | ICD-10-CM

## 2017-03-20 MED ORDER — TRAMADOL HCL 50 MG PO TABS: 50.0000 mg | ORAL_TABLET | Freq: Two times a day (BID) | ORAL | 2 refills | Status: DC | PRN

## 2017-03-20 NOTE — Telephone Encounter (Signed)
Pt  Called requesting refill on Tramadol  Called into [facility]

## 2017-03-20 NOTE — Telephone Encounter (Signed)
Refilled Tramadol 3 month supply - sent e-script 50mg  take 1 q 12 hour PRN #30 +2 refills  [name], DO [facility] 03/20/2017, 4:56 PM

## 2017-03-23 ENCOUNTER — Other Ambulatory Visit: Payer: Self-pay | Admitting: Family Medicine

## 2017-03-23 DIAGNOSIS — N400 Enlarged prostate without lower urinary tract symptoms: Secondary | ICD-10-CM

## 2017-03-30 ENCOUNTER — Other Ambulatory Visit: Payer: Self-pay | Admitting: Family Medicine

## 2017-03-30 DIAGNOSIS — I10 Essential (primary) hypertension: Secondary | ICD-10-CM

## 2017-05-12 ENCOUNTER — Telehealth: Payer: Self-pay | Admitting: Family Medicine

## 2017-05-12 ENCOUNTER — Other Ambulatory Visit: Payer: Self-pay | Admitting: Family Medicine

## 2017-05-12 DIAGNOSIS — E0800 Diabetes mellitus due to underlying condition with hyperosmolarity without nonketotic hyperglycemic-hyperosmolar coma (NKHHC): Secondary | ICD-10-CM

## 2017-05-12 MED ORDER — GLIMEPIRIDE 4 MG PO TABS: ORAL_TABLET | ORAL | 0 refills | Status: DC

## 2017-05-12 NOTE — Telephone Encounter (Signed)
Rx send

## 2017-05-12 NOTE — Telephone Encounter (Signed)
Pt needs a refill on glimepiride sent to [facility].

## 2017-05-20 ENCOUNTER — Encounter: Payer: Self-pay | Admitting: Family Medicine

## 2017-05-20 ENCOUNTER — Ambulatory Visit (INDEPENDENT_AMBULATORY_CARE_PROVIDER_SITE_OTHER): Payer: Medicare Other | Admitting: Family Medicine

## 2017-05-20 VITALS — BP 146/70 | HR 61 | Temp 97.8°F | Resp 16 | Ht 69.0 in | Wt 169.0 lb

## 2017-05-20 DIAGNOSIS — G629 Polyneuropathy, unspecified: Secondary | ICD-10-CM | POA: Diagnosis not present

## 2017-05-20 DIAGNOSIS — Z23 Encounter for immunization: Secondary | ICD-10-CM

## 2017-05-20 DIAGNOSIS — N401 Enlarged prostate with lower urinary tract symptoms: Secondary | ICD-10-CM | POA: Diagnosis not present

## 2017-05-20 DIAGNOSIS — F5101 Primary insomnia: Secondary | ICD-10-CM

## 2017-05-20 DIAGNOSIS — R600 Localized edema: Secondary | ICD-10-CM | POA: Diagnosis not present

## 2017-05-20 DIAGNOSIS — R42 Dizziness and giddiness: Secondary | ICD-10-CM

## 2017-05-20 DIAGNOSIS — G4701 Insomnia due to medical condition: Secondary | ICD-10-CM

## 2017-05-20 DIAGNOSIS — F419 Anxiety disorder, unspecified: Secondary | ICD-10-CM | POA: Diagnosis not present

## 2017-05-20 DIAGNOSIS — E1142 Type 2 diabetes mellitus with diabetic polyneuropathy: Secondary | ICD-10-CM | POA: Diagnosis not present

## 2017-05-20 DIAGNOSIS — I5022 Chronic systolic (congestive) heart failure: Secondary | ICD-10-CM

## 2017-05-20 DIAGNOSIS — I739 Peripheral vascular disease, unspecified: Secondary | ICD-10-CM

## 2017-05-20 DIAGNOSIS — R351 Nocturia: Secondary | ICD-10-CM

## 2017-05-20 MED ORDER — HYDROXYZINE HCL 10 MG PO TABS: 10.0000 mg | ORAL_TABLET | Freq: Three times a day (TID) | ORAL | 3 refills | Status: DC | PRN

## 2017-05-20 MED ORDER — BUSPIRONE HCL 5 MG PO TABS: 5.0000 mg | ORAL_TABLET | Freq: Every day | ORAL | 3 refills | Status: DC | PRN

## 2017-05-20 MED ORDER — MIRTAZAPINE 15 MG PO TABS: 15.0000 mg | ORAL_TABLET | Freq: Every day | ORAL | 3 refills | Status: DC

## 2017-05-20 MED ORDER — GABAPENTIN 100 MG PO CAPS: ORAL_CAPSULE | ORAL | 3 refills | Status: DC

## 2017-05-20 MED ORDER — FINASTERIDE 5 MG PO TABS: 5.0000 mg | ORAL_TABLET | Freq: Every day | ORAL | 3 refills | Status: DC

## 2017-05-20 MED ORDER — MECLIZINE HCL 25 MG PO TABS: 25.0000 mg | ORAL_TABLET | Freq: Every day | ORAL | 3 refills | Status: DC | PRN

## 2017-05-20 MED ORDER — FUROSEMIDE 40 MG PO TABS: ORAL_TABLET | ORAL | 3 refills | Status: DC

## 2017-05-20 NOTE — Progress Notes (Signed)
Subjective:    Patient ID: Randy Howard, male    DOB: June 26, 1940, 77 y.o.   MRN: [id number]  Randy Howard is a 77 y.o. male presenting on 05/20/2017 for Diabetes   HPI   CHRONIC HTN / PAD Reports no new concern. History of persistent elevated BP. Not checking BP outside office Followed by [facility] in past,  Current Meds - Benazepril 40mg  daily, Metoprolol 50mg  BID, Furosemide 40mg  x 2 AM and PM Reports good compliance, took meds today. Tolerating well, w/o complaints. Lifestyle - remains active limited regular exercise due to chronic pain Admits some dizziness at times, and edema Denies CP, dyspnea, HA, dizziness / lightheadedness  CKD III-IV / Chronic Peripheral Edema - Followed by Dr Randy Howard and Vascular Dr Randy Howard - Continues on Furosemide 40mg  x 2 BID - by his report, he states no longer PRN was taking regularly and needs 90 day supply - Has not returned to Vascular, has multiple stents in legs and was advised to have angiogram - Wearing compression socks - He admits some toenails dark Admits some skin changes and ulceration of feet - he states will need to schedule to return to Dr Randy Howard [facility]iat[facility]  He will follow-up with Dr Randy Howard Dermat[facility] for several spots  CHRONIC DM, Type 2: Last A1c 10/2016 Decline POC today needs full lab draw Meds: Glimepiride 4mg  daily in AM Reports good compliance. 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 - Taking Baclofen 10mg  daily in AM - request refill - Taking Gabapentin 100mg  capsules x 2 in morning and 1-2 PM - Taking Tramadol 50mg  in morning - doing well on this med stil - not due for refill - Cannot take NSAIDs, not taking Tylenol regularly   WEIGHT LOSS Still low weight,  reduced appetite, weight unchanged over past >6-9 months, history up to 190 lbs then down to 150s - now back up to 160s - Drinking 2-4 boost supplements at times - More difficulty functioning and standing, participating in games at senior center   Health Maintenance: Due for Flu Shot, will receive today    Depression screen [facility] 2/9 11/12/2016 04/28/2016 11/05/2015  Decreased Interest 1 0 0  Down, Depressed, Hopeless 2 0 0  PHQ - 2 Score 3 0 0  Altered sleeping 3 - -  Tired, decreased energy 3 - -  Change in appetite 3 - -  Feeling bad or failure about yourself  0 - -  Trouble concentrating 2 - -  Moving slowly or fidgety/restless 1 - -  Suicidal thoughts 0 - -  PHQ-9 Score 15 - -  Difficult doing work/chores Somewhat difficult - -   No flowsheet data found.   Social History   Tobacco Use  . Smoking status: Former Smoker    Packs/day: 1.00    Years: 25.00    Pack years: 25.00    Types: Cigarettes    Last attempt to quit: 03/24/1985    Years since quitting: 32.1  . Smokeless tobacco: Never Used  Substance Use Topics  . Alcohol use: No    Alcohol/week: 0.0 oz  . Drug use: No    Review of Systems Per HPI unless specifically indicated above     Objective:    BP (!) 146/70 (BP Location:  Right Arm, Cuff Size: Normal)   Pulse 61   Temp 97.8 F (36.6 C) (Oral)   Resp 16   Ht 5\' 9"  (1.753 m)   Wt 169 lb (76.7 kg)   BMI 24.96 kg/m   Wt Readings from Last 3 Encounters:  05/20/17 169 lb (76.7 kg)  11/12/16 169 lb 6.4 oz (76.8 kg)  08/20/16 166 lb (75.3 kg)    Physical Exam  Constitutional: He is oriented to person, place, and time. He appears well-developed and well-nourished. No distress.  Chronically ill but currently well-appearing 77 year old male, comfortable, cooperative  HENT:  Head: Normocephalic and atraumatic.  Mouth/Throat: Oropharynx is clear and moist.  Eyes: Conjunctivae are normal. Right eye exhibits no discharge. Left eye exhibits no discharge.    Cardiovascular: Normal rate, regular rhythm, normal heart sounds and intact distal pulses.  No murmur heard. Pulmonary/Chest: Effort normal.  Musculoskeletal: He exhibits edema (Improved edema, now trace to +1 edema bilateral feet).  Neurological: He is alert and oriented to person, place, and time. Coordination normal.  Distal sensation to light touch is reduced lower extremity feet Gait is cautious but appropriate, has cane  Has some generalized tremor most of body at baseline  Skin: Skin is warm and dry. No rash noted. He is not diaphoretic. No erythema.  Bilateral lower extremities feet ankles with some evidence of chronic venous changes and PAD with some color change stasis dermatitis  Feet with chronic calluses multiple areas including between toes some chronic ulceration w/o sign of active infection  Psychiatric: He has a normal mood and affect. His behavior is normal.  Nursing note and vitals reviewed.  Results for orders placed or performed in visit on 11/12/16  POCT HgB A1C  Result Value Ref Range   Hemoglobin A1C 6.7 (A) 5.7      Assessment & Plan:   Problem List Items Addressed This Visit    Anxiety disorder   Relevant Medications   hydrOXYzine (ATARAX/VISTARIL) 10 MG tablet   busPIRone (BUSPAR) 5 MG tablet   BPH (benign prostatic hyperplasia)   Relevant Medications   finasteride (PROSCAR) 5 MG tablet   CHF (congestive heart failure) (HCC)   Relevant Medications   furosemide (LASIX) 40 MG tablet   Insomnia   Relevant Medications   hydrOXYzine (ATARAX/VISTARIL) 10 MG tablet   Neuropathy   Relevant Medications   gabapentin (NEURONTIN) 100 MG capsule   Peripheral vascular disease (HCC)   Relevant Medications   furosemide (LASIX) 40 MG tablet   Type 2 diabetes mellitus with diabetic polyneuropathy, without long-term current use of insulin (HCC) - Primary   Relevant Medications   hydrOXYzine (ATARAX/VISTARIL) 10 MG tablet   gabapentin (NEURONTIN) 100 MG capsule    busPIRone (BUSPAR) 5 MG tablet    Other Visit Diagnoses    Needs flu shot       Relevant Orders   Flu vaccine HIGH DOSE PF (Completed)   Pedal edema       Relevant Medications   furosemide (LASIX) 40 MG tablet   Vertigo       Relevant Medications   meclizine (ANTIVERT) 25 MG tablet      Requested 90 day supply for all med refills today due to limited transportation to pharmacy  Continue current med list see refills below No changes, advised may need to adjust Lasix again in future, concern with high dose, but this rx was continued from previous was not changed today. He is followed by [facility]  Advised he should return to podiatry for chronic callus/ulceration - w/o active infection today Precaution on several sedating meds at risk for side effects, he is adamant about not changing meds at this time DC"d Mirtazapine since he was no longer taking this, he did not recall list and asked for refill but states no longer taking this one  DM is controlled from previous reading, no CBGs Will re-check A1c with upcoming labs soon as he is due   Meds ordered this encounter  Medications  . hydrOXYzine (ATARAX/VISTARIL) 10 MG tablet    Sig: Take 1 tablet (10 mg total) by mouth 3 (three) times daily as needed.    Dispense:  90 tablet    Refill:  3  . gabapentin (NEURONTIN) 100 MG capsule    Sig: Take 2 capsules in AM and 1-2 capsules at bedtime    Dispense:  360 capsule    Refill:  3    90 day supply  . furosemide (LASIX) 40 MG tablet    Sig: TAKE 2 TABLETS BY MOUTH TWICE DAILY (MORNING AND EARLY AFTERNOON)    Dispense:  360 tablet    Refill:  3  . finasteride (PROSCAR) 5 MG tablet    Sig: Take 1 tablet (5 mg total) by mouth daily.    Dispense:  90 tablet    Refill:  3    90 day supply  . busPIRone (BUSPAR) 5 MG tablet    Sig: Take 1 tablet (5 mg total) by mouth daily as needed.    Dispense:  90 tablet    Refill:  3  . DISCONTD: mirtazapine (REMERON) 15 MG  tablet    Sig: Take 1 tablet (15 mg total) by mouth at bedtime.    Dispense:  90 tablet    Refill:  3  . meclizine (ANTIVERT) 25 MG tablet    Sig: Take 1 tablet (25 mg total) by mouth daily as needed for dizziness.    Dispense:  90 tablet    Refill:  3      Follow up plan: Return in about 2 weeks (around 06/03/2017) for lab only fasting.  Future labs ordered for 06/01/17 he will return with transportation, will f/u labs by phone determine next apt  [name], [facility] 05/20/2017, 11:38 PM

## 2017-05-20 NOTE — Patient Instructions (Addendum)
Thank you for coming to the office today.  1.  Refilled all meds for 90 day supply, sent to [facility]  2. Recommend to return to Dr Cleda Mccreedy Podiatry for further foot care  Please tell [facility] that you do NOT want to pick up Mirtazapine (Remeron) - since you have stopped taking this.  Follow-up with Dr Evorn Gong  Will not check A1c today in office, if you arrange transportation for 1-2 weeks we can check all blood work together  DUE for [name] (no food or drink after midnight before the lab appointment, only water or coffee without cream/sugar on the morning of)  SCHEDULE "Lab Only" visit in the morning at the clinic for lab draw in 1-2 WEEKS   - Make sure Lab Only appointment is at about 1 week before your next appointment, so that results will be available  For Lab Results, once available within 2-3 days of blood draw, you can can log in to MyChart online to view your results and a brief explanation. Also, we can discuss results at next follow-up visit.   Please schedule a Follow-up Appointment to: Return in about 2 weeks (around 06/03/2017) for lab only fasting.    If you have any other questions or concerns, please feel free to call the office or send a message through [place]. You may also schedule an earlier appointment if necessary.  Additionally, you may be receiving a survey about your experience at our office within a few days to 1 week by e-mail or mail. We value your feedback.  [name], DO [facility]

## 2017-05-21 ENCOUNTER — Other Ambulatory Visit: Payer: Self-pay | Admitting: Family Medicine

## 2017-05-21 DIAGNOSIS — N401 Enlarged prostate with lower urinary tract symptoms: Secondary | ICD-10-CM

## 2017-05-21 DIAGNOSIS — E1142 Type 2 diabetes mellitus with diabetic polyneuropathy: Secondary | ICD-10-CM

## 2017-05-21 DIAGNOSIS — I509 Heart failure, unspecified: Secondary | ICD-10-CM

## 2017-05-21 DIAGNOSIS — I70213 Atherosclerosis of native arteries of extremities with intermittent claudication, bilateral legs: Secondary | ICD-10-CM

## 2017-05-21 DIAGNOSIS — E7800 Pure hypercholesterolemia, unspecified: Secondary | ICD-10-CM

## 2017-05-21 DIAGNOSIS — Z0000 Encounter for general adult medical examination without abnormal findings: Secondary | ICD-10-CM

## 2017-05-21 DIAGNOSIS — R351 Nocturia: Secondary | ICD-10-CM

## 2017-05-21 DIAGNOSIS — Z125 Encounter for screening for malignant neoplasm of prostate: Secondary | ICD-10-CM

## 2017-05-21 DIAGNOSIS — I10 Essential (primary) hypertension: Secondary | ICD-10-CM

## 2017-06-01 ENCOUNTER — Other Ambulatory Visit: Payer: Medicare Other

## 2017-06-01 DIAGNOSIS — E1142 Type 2 diabetes mellitus with diabetic polyneuropathy: Secondary | ICD-10-CM

## 2017-06-01 DIAGNOSIS — I10 Essential (primary) hypertension: Secondary | ICD-10-CM | POA: Diagnosis not present

## 2017-06-01 DIAGNOSIS — Z0000 Encounter for general adult medical examination without abnormal findings: Secondary | ICD-10-CM

## 2017-06-01 DIAGNOSIS — E7800 Pure hypercholesterolemia, unspecified: Secondary | ICD-10-CM

## 2017-06-01 DIAGNOSIS — I509 Heart failure, unspecified: Secondary | ICD-10-CM

## 2017-06-01 DIAGNOSIS — I70213 Atherosclerosis of native arteries of extremities with intermittent claudication, bilateral legs: Secondary | ICD-10-CM | POA: Diagnosis not present

## 2017-06-01 DIAGNOSIS — Z125 Encounter for screening for malignant neoplasm of prostate: Secondary | ICD-10-CM | POA: Diagnosis not present

## 2017-06-02 LAB — COMPLETE METABOLIC PANEL WITH GFR
AG Ratio: 1.5 (calc) (ref 1.0–2.5)
ALBUMIN MSPROF: 4 g/dL (ref 3.6–5.1)
ALT: 12 U/L (ref 9–46)
AST: 16 U/L (ref 10–35)
Alkaline phosphatase (APISO): 76 U/L (ref 40–115)
BUN/Creatinine Ratio: 15 (calc) (ref 6–22)
BUN: 24 mg/dL (ref 7–25)
CALCIUM: 9.4 mg/dL (ref 8.6–10.3)
CO2: 32 mmol/L (ref 20–32)
CREATININE: 1.62 mg/dL — AB (ref 0.70–1.18)
Chloride: 101 mmol/L (ref 98–110)
GFR, EST NON AFRICAN AMERICAN: 40 mL/min/{1.73_m2} — AB (ref >=60)
GFR, Est African American: 46 mL/min/{1.73_m2} — ABNORMAL LOW (ref >=60)
GLOBULIN: 2.7 g/dL (ref 1.9–3.7)
Glucose, Bld: 160 mg/dL — ABNORMAL HIGH (ref 65–99)
Potassium: 4.4 mmol/L (ref 3.5–5.3)
SODIUM: 138 mmol/L (ref 135–146)
Total Bilirubin: 0.5 mg/dL (ref 0.2–1.2)
Total Protein: 6.7 g/dL (ref 6.1–8.1)

## 2017-06-02 LAB — PSA, TOTAL WITH REFLEX TO PSA, FREE: PSA, TOTAL: 0.7 ng/mL (ref <=4.0)

## 2017-06-02 LAB — CBC WITH DIFFERENTIAL/PLATELET
BASOS PCT: 0.6 %
Basophils Absolute: 47 cells/uL (ref 0–200)
EOS ABS: 600 {cells}/uL — AB (ref 15–500)
Eosinophils Relative: 7.6 %
HCT: 41.5 % (ref 38.5–50.0)
Hemoglobin: 14.2 g/dL (ref 13.2–17.1)
LYMPHS ABS: 2315 {cells}/uL (ref 850–3900)
MCH: 30.5 pg (ref 27.0–33.0)
MCHC: 34.2 g/dL (ref 32.0–36.0)
MCV: 89.2 fL (ref 80.0–100.0)
MPV: 11.2 fL (ref 7.5–12.5)
Monocytes Relative: 8.1 %
NEUTROS PCT: 54.4 %
Neutro Abs: 4298 cells/uL (ref 1500–7800)
Platelets: 149 10*3/uL (ref 140–400)
RBC: 4.65 10*6/uL (ref 4.20–5.80)
RDW: 12.3 % (ref 11.0–15.0)
Total Lymphocyte: 29.3 %
WBC: 7.9 10*3/uL (ref 3.8–10.8)
WBCMIX: 640 {cells}/uL (ref 200–950)

## 2017-06-02 LAB — LIPID PANEL
CHOLESTEROL: 198 mg/dL (ref <200)
HDL: 32 mg/dL — ABNORMAL LOW (ref >40)
LDL Cholesterol (Calc): 134 mg/dL (calc) — ABNORMAL HIGH
Non-HDL Cholesterol (Calc): 166 mg/dL (calc) — ABNORMAL HIGH (ref <130)
Total CHOL/HDL Ratio: 6.2 (calc) — ABNORMAL HIGH (ref <5.0)
Triglycerides: 183 mg/dL — ABNORMAL HIGH (ref <150)

## 2017-06-02 LAB — HEMOGLOBIN A1C
Hgb A1c MFr Bld: 7.9 % of total Hgb — ABNORMAL HIGH (ref <5.7)
MEAN PLASMA GLUCOSE: 180 (calc)
eAG (mmol/L): 10 (calc)

## 2017-06-09 ENCOUNTER — Other Ambulatory Visit: Payer: Self-pay | Admitting: Family Medicine

## 2017-06-09 DIAGNOSIS — E1142 Type 2 diabetes mellitus with diabetic polyneuropathy: Secondary | ICD-10-CM

## 2017-06-09 DIAGNOSIS — N183 Chronic kidney disease, stage 3 unspecified: Secondary | ICD-10-CM | POA: Insufficient documentation

## 2017-06-09 DIAGNOSIS — I10 Essential (primary) hypertension: Secondary | ICD-10-CM

## 2017-06-19 ENCOUNTER — Other Ambulatory Visit: Payer: Self-pay | Admitting: Family Medicine

## 2017-06-19 DIAGNOSIS — M150 Primary generalized (osteo)arthritis: Secondary | ICD-10-CM

## 2017-06-19 DIAGNOSIS — M159 Polyosteoarthritis, unspecified: Secondary | ICD-10-CM

## 2017-06-19 DIAGNOSIS — B351 Tinea unguium: Secondary | ICD-10-CM | POA: Diagnosis not present

## 2017-06-19 DIAGNOSIS — M2041 Other hammer toe(s) (acquired), right foot: Secondary | ICD-10-CM | POA: Diagnosis not present

## 2017-06-19 DIAGNOSIS — E1140 Type 2 diabetes mellitus with diabetic neuropathy, unspecified: Secondary | ICD-10-CM | POA: Diagnosis not present

## 2017-06-19 DIAGNOSIS — L851 Acquired keratosis [keratoderma] palmaris et plantaris: Secondary | ICD-10-CM | POA: Diagnosis not present

## 2017-06-19 DIAGNOSIS — G629 Polyneuropathy, unspecified: Secondary | ICD-10-CM

## 2017-06-19 DIAGNOSIS — I739 Peripheral vascular disease, unspecified: Secondary | ICD-10-CM | POA: Diagnosis not present

## 2017-07-01 DIAGNOSIS — Z85828 Personal history of other malignant neoplasm of skin: Secondary | ICD-10-CM | POA: Diagnosis not present

## 2017-07-01 DIAGNOSIS — L570 Actinic keratosis: Secondary | ICD-10-CM | POA: Diagnosis not present

## 2017-07-01 DIAGNOSIS — L300 Nummular dermatitis: Secondary | ICD-10-CM | POA: Diagnosis not present

## 2017-07-10 ENCOUNTER — Other Ambulatory Visit: Payer: Self-pay | Admitting: Family Medicine

## 2017-07-10 DIAGNOSIS — I70213 Atherosclerosis of native arteries of extremities with intermittent claudication, bilateral legs: Secondary | ICD-10-CM

## 2017-07-10 DIAGNOSIS — I739 Peripheral vascular disease, unspecified: Secondary | ICD-10-CM

## 2017-07-29 DIAGNOSIS — C4359 Malignant melanoma of other part of trunk: Secondary | ICD-10-CM | POA: Diagnosis not present

## 2017-07-29 DIAGNOSIS — L308 Other specified dermatitis: Secondary | ICD-10-CM | POA: Diagnosis not present

## 2017-08-05 DIAGNOSIS — D039 Melanoma in situ, unspecified: Secondary | ICD-10-CM | POA: Diagnosis not present

## 2017-08-05 DIAGNOSIS — D0359 Melanoma in situ of other part of trunk: Secondary | ICD-10-CM | POA: Diagnosis not present

## 2017-08-06 ENCOUNTER — Other Ambulatory Visit: Payer: Self-pay | Admitting: Family Medicine

## 2017-08-06 ENCOUNTER — Telehealth: Payer: Self-pay | Admitting: Family Medicine

## 2017-08-06 DIAGNOSIS — G629 Polyneuropathy, unspecified: Secondary | ICD-10-CM

## 2017-08-06 DIAGNOSIS — M150 Primary generalized (osteo)arthritis: Secondary | ICD-10-CM

## 2017-08-06 DIAGNOSIS — M159 Polyosteoarthritis, unspecified: Secondary | ICD-10-CM

## 2017-08-06 MED ORDER — TRAMADOL HCL 50 MG PO TABS: ORAL_TABLET | ORAL | 2 refills | Status: DC

## 2017-08-06 NOTE — Telephone Encounter (Signed)
Pt needs refill on tramadol sent to [facility].  His call back number is [phone]

## 2017-08-06 NOTE — Telephone Encounter (Signed)
Rx send for approval. 

## 2017-08-08 ENCOUNTER — Other Ambulatory Visit: Payer: Self-pay | Admitting: Family Medicine

## 2017-08-08 DIAGNOSIS — E0800 Diabetes mellitus due to underlying condition with hyperosmolarity without nonketotic hyperglycemic-hyperosmolar coma (NKHHC): Secondary | ICD-10-CM

## 2017-08-18 ENCOUNTER — Ambulatory Visit (INDEPENDENT_AMBULATORY_CARE_PROVIDER_SITE_OTHER): Payer: Medicare HMO | Admitting: Vascular Surgery

## 2017-08-19 DIAGNOSIS — L308 Other specified dermatitis: Secondary | ICD-10-CM | POA: Diagnosis not present

## 2017-08-19 DIAGNOSIS — L820 Inflamed seborrheic keratosis: Secondary | ICD-10-CM | POA: Diagnosis not present

## 2017-08-27 DIAGNOSIS — R0602 Shortness of breath: Secondary | ICD-10-CM | POA: Diagnosis not present

## 2017-08-27 DIAGNOSIS — I2510 Atherosclerotic heart disease of native coronary artery without angina pectoris: Secondary | ICD-10-CM | POA: Diagnosis not present

## 2017-08-27 DIAGNOSIS — I509 Heart failure, unspecified: Secondary | ICD-10-CM | POA: Diagnosis not present

## 2017-08-27 DIAGNOSIS — I208 Other forms of angina pectoris: Secondary | ICD-10-CM | POA: Diagnosis not present

## 2017-08-27 DIAGNOSIS — R600 Localized edema: Secondary | ICD-10-CM | POA: Diagnosis not present

## 2017-09-01 ENCOUNTER — Ambulatory Visit (INDEPENDENT_AMBULATORY_CARE_PROVIDER_SITE_OTHER): Payer: Medicare Other | Admitting: Vascular Surgery

## 2017-09-01 ENCOUNTER — Other Ambulatory Visit (INDEPENDENT_AMBULATORY_CARE_PROVIDER_SITE_OTHER): Payer: Self-pay

## 2017-09-01 ENCOUNTER — Encounter (INDEPENDENT_AMBULATORY_CARE_PROVIDER_SITE_OTHER): Payer: Self-pay | Admitting: Vascular Surgery

## 2017-09-01 VITALS — BP 148/88 | HR 76 | Resp 14 | Ht 69.5 in | Wt 163.0 lb

## 2017-09-01 DIAGNOSIS — I739 Peripheral vascular disease, unspecified: Secondary | ICD-10-CM

## 2017-09-01 DIAGNOSIS — E7800 Pure hypercholesterolemia, unspecified: Secondary | ICD-10-CM

## 2017-09-01 DIAGNOSIS — I10 Essential (primary) hypertension: Secondary | ICD-10-CM | POA: Diagnosis not present

## 2017-09-01 DIAGNOSIS — E1142 Type 2 diabetes mellitus with diabetic polyneuropathy: Secondary | ICD-10-CM | POA: Diagnosis not present

## 2017-09-01 DIAGNOSIS — M7989 Other specified soft tissue disorders: Secondary | ICD-10-CM | POA: Diagnosis not present

## 2017-09-01 NOTE — Assessment & Plan Note (Signed)
blood glucose control important in reducing the progression of atherosclerotic disease. Also, involved in wound healing. On appropriate medications.  

## 2017-09-01 NOTE — Patient Instructions (Signed)

## 2017-09-01 NOTE — Assessment & Plan Note (Signed)
blood pressure control important in reducing the progression of atherosclerotic disease. On appropriate oral medications.  

## 2017-09-01 NOTE — Assessment & Plan Note (Signed)

## 2017-09-01 NOTE — Progress Notes (Signed)
MRN : [id number]  Randy Howard is a 77 y.o. (1940/05/28) male who presents with chief complaint of  Chief Complaint  Patient presents with  . Follow-up    Leg swelling and numbness  .  History of Present Illness: Patient returns today in follow up of multiple issues.  He has a long history of peripheral arterial disease and it does not appears if he has been seen in over a year.  He has undergone bilateral revascularization on many occurrences over the years.  His complaint currently is of swelling as well as numbness and pain in his legs.  His diuretic was increased by his cardiologist which has improved but not resolved his swelling.  He has very prominent varicosities particularly in the right leg and this leg is more swollen.  Stasis dermatitis changes are present.  No open ulceration or infection.  He has a lot of unsteadiness on his feet and difficulty walking.  Current Outpatient Medications  Medication Sig Dispense Refill  . aspirin 81 MG tablet Take 81 mg by mouth daily.    . benazepril (LOTENSIN) 40 MG tablet TAKE 1 TABLET BY MOUTH ONCE DAILY 90 tablet 3  . busPIRone (BUSPAR) 5 MG tablet Take 1 tablet (5 mg total) by mouth daily as needed. 90 tablet 3  . clopidogrel (PLAVIX) 75 MG tablet TAKE 1 TABLET BY MOUTH ONCE DAILY 90 tablet 3  . finasteride (PROSCAR) 5 MG tablet Take 1 tablet (5 mg total) by mouth daily. 90 tablet 3  . furosemide (LASIX) 40 MG tablet TAKE 2 TABLETS BY MOUTH TWICE DAILY (MORNING AND EARLY AFTERNOON) 360 tablet 3  . gabapentin (NEURONTIN) 100 MG capsule Take 2 capsules in AM and 1-2 capsules at bedtime 360 capsule 3  . glimepiride (AMARYL) 4 MG tablet TAKE 1 TABLET BY MOUTH ONCE EVERY MORNING 90 tablet 1  . hydrOXYzine (ATARAX/VISTARIL) 10 MG tablet Take 1 tablet (10 mg total) by mouth 3 (three) times daily as needed. 90 tablet 3  . Magnesium Oxide, Antacid, 500 MG CAPS Take by mouth.    . meclizine (ANTIVERT) 25 MG tablet Take 1 tablet (25 mg total) by  mouth daily as needed for dizziness. 90 tablet 3  . metoprolol tartrate (LOPRESSOR) 50 MG tablet Take 1 tablet (50 mg total) by mouth 2 (two) times daily. 180 tablet 3  . mirtazapine (REMERON) 15 MG tablet     . nitroGLYCERIN (NITROSTAT) 0.4 MG SL tablet Place 1 tablet (0.4 mg total) under the tongue every 5 (five) minutes as needed for chest pain. 50 tablet 3  . tamsulosin (FLOMAX) 0.4 MG CAPS capsule TAKE 1 CAPSULE BY MOUTH ONCE DAILY 90 capsule 3  . torsemide (DEMADEX) 20 MG tablet TAKE 20 MG BY MOUTH TWICE DAILY FOR 3 DAYS, THEN 20 MG DAILY THEREAFTER    . traMADol (ULTRAM) 50 MG tablet TAKE 1 TABLET BY MOUTH EVERY 12 HOURS ASNEEDED FOR MODERATE PAIN 30 tablet 2  . triamcinolone ointment (KENALOG) 0.1 % Apply topically.    . baclofen (LIORESAL) 10 MG tablet Take 1 tablet (10 mg total) by mouth 3 (three) times daily as needed. (Patient not taking: Reported on 09/01/2017) 90 each 3  . lovastatin (MEVACOR) 40 MG tablet Take by mouth.    . Potassium 99 MG TABS Take by mouth.     No current facility-administered medications for this visit.     Past Medical History:  Diagnosis Date  . Arthritis   . Coronary artery disease   .  Elevated lipids   . Hyperlipidemia   . Hypertension   . Neuropathy   . Nocturia   . Obstructive sleep apnea   . Peripheral vascular disease ([place])   . Prostate enlargement   . PVD (peripheral vascular disease) ([place])   . Right carotid bruit   . Skin cancer    Followed by Dr. Evorn Gong  . Skin cancer   . Tremor     Past Surgical History:  Procedure Laterality Date  . CARDIAC CATHETERIZATION    . CAROTID ARTERY ANGIOPLASTY Left 09/30/2011  . CORONARY ARTERY BYPASS GRAFT  2006  . ENDARTERECTOMY FEMORAL Bilateral 11/01/2014   Procedure: ENDARTERECTOMY FEMORAL;  Surgeon: Algernon Huxley, MD;  Location: [facility];  Service: Vascular;  Laterality: Bilateral;  . PERIPHERAL VASCULAR CATHETERIZATION Left 10/09/2014   Procedure: Lower Extremity Angiography;  Surgeon: Algernon Huxley, MD;  Location: [facility];  Service: Cardiovascular;  Laterality: Left;  . PERIPHERAL VASCULAR CATHETERIZATION  10/09/2014   Procedure: Lower Extremity Intervention;  Surgeon: Algernon Huxley, MD;  Location: [facility];  Service: Cardiovascular;;    Social History Social History   Tobacco Use  . Smoking status: Former Smoker    Packs/day: 1.00    Years: 25.00    Pack years: 25.00    Types: Cigarettes    Last attempt to quit: 03/24/1985    Years since quitting: 32.4  . Smokeless tobacco: Never Used  Substance Use Topics  . Alcohol use: No    Alcohol/week: 0.0 oz  . Drug use: No    Family History Family History  Problem Relation Age of Onset  . Heart disease Sister   . Heart disease Sister   . Diabetes Sister   . Diabetes Sister   . Diabetes Brother   No bleeding or clotting disorders  Allergies  Allergen Reactions  . Oxycodone Nausea Only     REVIEW OF SYSTEMS (Negative unless checked)  Constitutional: [] Weight loss  [] Fever  [] Chills Cardiac: [] Chest pain   [] Chest pressure   [] Palpitations   [] Shortness of breath when laying flat   [] Shortness of breath at rest   [x] Shortness of breath with exertion. Vascular:  [] Pain in legs with walking   [] Pain in legs at rest   [] Pain in legs when laying flat   [] Claudication   [] Pain in feet when walking  [] Pain in feet at rest  [] Pain in feet when laying flat   [] History of DVT   [] Phlebitis   [x] Swelling in legs   [x] Varicose veins   [] Non-healing ulcers Pulmonary:   [] Uses home oxygen   [] Productive cough   [] Hemoptysis   [] Wheeze  [] COPD   [] Asthma Neurologic:  [] Dizziness  [] Blackouts   [] Seizures   [] History of stroke   [] History of TIA  [] Aphasia   [] Temporary blindness   [] Dysphagia   [] Weakness or numbness in arms   [x] Weakness or numbness in legs Musculoskeletal:  [x] Arthritis   [] Joint swelling   [x] Joint pain   [] Low back pain Hematologic:  [] Easy bruising  [] Easy bleeding   [] Hypercoagulable state    [] Anemic   Gastrointestinal:  [] Blood in stool   [] Vomiting blood  [x] Gastroesophageal reflux/heartburn   [] Abdominal pain Genitourinary:  [] Chronic kidney disease   [] Difficult urination  [] Frequent urination  [] Burning with urination   [] Hematuria Skin:  [] Rashes   [] Ulcers   [] Wounds Psychological:  [] History of anxiety   []  History of major depression.  Physical Examination  BP (!) 148/88 (BP Location:  Right Arm, Patient Position: Sitting)   Pulse 76   Resp 14   Ht 5' 9.5" (1.765 m)   Wt 163 lb (73.9 kg)   BMI 23.73 kg/m  Gen:  WD/WN, NAD Head: [place]/AT, No temporalis wasting. Ear/Nose/Throat: Hearing grossly intact, nares w/o erythema or drainage Eyes: Conjunctiva clear. Sclera non-icteric Neck: Supple.  Trachea midline Pulmonary:  Good air movement, no use of accessory muscles.  Cardiac: Irregular Vascular: extensive varicosities on the right leg measuring 3-4 mm in diameter.  Diffuse varicosities on the left leg measuring 2-3 mm in diameter Vessel Right Left  Radial Palpable Palpable                          PT  not palpable  trace palpable  DP  1+ palpable  1+ palpable    Musculoskeletal: M/S 5/5 throughout.  No deformity or atrophy.  2+ right lower extremity edema, 1+ left lower extremity edema. Neurologic: Sensation decreased in extremities.  Symmetrical.  Speech is fluent.  Psychiatric: Judgment intact, Mood & affect appropriate for pt's clinical situation. Dermatologic: No rashes or ulcers noted.  No cellulitis or open wounds.  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  Radiology No results found.  Assessment/Plan  Hypertension blood pressure control important in reducing the progression of atherosclerotic disease. On appropriate oral medications.   Type 2 diabetes mellitus with diabetic polyneuropathy, without long-term current use of  insulin (HCC) blood glucose control important in reducing the progression of atherosclerotic disease. Also, involved in wound healing. On appropriate medications.   Elevated cholesterol lipid control important in reducing the progression of atherosclerotic disease. Continue statin therapy   Peripheral vascular disease ([place]) Has extensive disease. Long history with multiple intervention.  Recheck ABIs at his convenience.  No open ulcerations at current.  Swelling of limb I have had a long discussion with the patient regarding swelling and why it  causes symptoms.  Patient will begin wearing graduated compression stockings class 1 (20-30 mmHg) on a daily basis a prescription was given. The patient will  beginning wearing the stockings first thing in the morning and removing them in the evening. The patient is instructed specifically not to sleep in the stockings.   In addition, behavioral modification will be initiated.  This will include frequent elevation, use of over the counter pain medications and exercise such as walking.  I have reviewed systemic causes for chronic edema such as liver, kidney and cardiac etiologies.  The patient denies problems with these organ systems.    Consideration for a lymph pump will also be made based upon the effectiveness of conservative therapy.  This would help to improve the edema control and prevent sequela such as ulcers and infections   Patient should undergo duplex ultrasound of the venous system to ensure that DVT or reflux is not present.  The patient will follow-up with me after the ultrasound.      Leotis Pain, MD  09/01/2017 4:19 PM    This note was created with Dragon medical transcription system.  Any errors from dictation are purely unintentional

## 2017-09-01 NOTE — Assessment & Plan Note (Signed)
Has extensive disease. Long history with multiple intervention.  Recheck ABIs at his convenience.  No open ulcerations at current.

## 2017-09-01 NOTE — Assessment & Plan Note (Signed)
lipid control important in reducing the progression of atherosclerotic disease. Continue statin therapy  

## 2017-09-16 ENCOUNTER — Other Ambulatory Visit: Payer: Self-pay | Admitting: Family Medicine

## 2017-09-16 DIAGNOSIS — M159 Polyosteoarthritis, unspecified: Secondary | ICD-10-CM

## 2017-09-16 DIAGNOSIS — M150 Primary generalized (osteo)arthritis: Principal | ICD-10-CM

## 2017-09-26 ENCOUNTER — Other Ambulatory Visit: Payer: Self-pay | Admitting: Family Medicine

## 2017-09-26 DIAGNOSIS — N401 Enlarged prostate with lower urinary tract symptoms: Secondary | ICD-10-CM

## 2017-09-30 DIAGNOSIS — I509 Heart failure, unspecified: Secondary | ICD-10-CM | POA: Diagnosis not present

## 2017-09-30 DIAGNOSIS — R600 Localized edema: Secondary | ICD-10-CM | POA: Diagnosis not present

## 2017-09-30 DIAGNOSIS — I2510 Atherosclerotic heart disease of native coronary artery without angina pectoris: Secondary | ICD-10-CM | POA: Diagnosis not present

## 2017-09-30 DIAGNOSIS — R0602 Shortness of breath: Secondary | ICD-10-CM | POA: Diagnosis not present

## 2017-09-30 DIAGNOSIS — I208 Other forms of angina pectoris: Secondary | ICD-10-CM | POA: Diagnosis not present

## 2017-10-05 ENCOUNTER — Ambulatory Visit: Admission: RE | Admit: 2017-10-05 | Payer: Medicare Other | Source: Ambulatory Visit

## 2017-10-05 ENCOUNTER — Telehealth (INDEPENDENT_AMBULATORY_CARE_PROVIDER_SITE_OTHER): Payer: Self-pay

## 2017-10-05 NOTE — Telephone Encounter (Signed)
Kim called and stated that the patient missed his appointment this morning for an ABI that was ordered by Dr. Lucky Cowboy.  We need to call him and see if he wants to get rescheduled here in the office.

## 2017-10-07 DIAGNOSIS — I208 Other forms of angina pectoris: Secondary | ICD-10-CM | POA: Diagnosis not present

## 2017-10-07 DIAGNOSIS — R0602 Shortness of breath: Secondary | ICD-10-CM | POA: Diagnosis not present

## 2017-10-07 DIAGNOSIS — I2510 Atherosclerotic heart disease of native coronary artery without angina pectoris: Secondary | ICD-10-CM | POA: Diagnosis not present

## 2017-10-07 DIAGNOSIS — I509 Heart failure, unspecified: Secondary | ICD-10-CM | POA: Diagnosis not present

## 2017-10-07 DIAGNOSIS — R600 Localized edema: Secondary | ICD-10-CM | POA: Diagnosis not present

## 2017-10-20 ENCOUNTER — Ambulatory Visit (INDEPENDENT_AMBULATORY_CARE_PROVIDER_SITE_OTHER): Payer: Medicare Other | Admitting: Vascular Surgery

## 2017-10-20 ENCOUNTER — Encounter (INDEPENDENT_AMBULATORY_CARE_PROVIDER_SITE_OTHER): Payer: Medicare Other

## 2017-10-22 DIAGNOSIS — B351 Tinea unguium: Secondary | ICD-10-CM | POA: Diagnosis not present

## 2017-10-22 DIAGNOSIS — L97521 Non-pressure chronic ulcer of other part of left foot limited to breakdown of skin: Secondary | ICD-10-CM | POA: Diagnosis not present

## 2017-10-22 DIAGNOSIS — E1140 Type 2 diabetes mellitus with diabetic neuropathy, unspecified: Secondary | ICD-10-CM | POA: Diagnosis not present

## 2017-10-22 DIAGNOSIS — L851 Acquired keratosis [keratoderma] palmaris et plantaris: Secondary | ICD-10-CM | POA: Diagnosis not present

## 2017-11-04 ENCOUNTER — Other Ambulatory Visit: Payer: Self-pay | Admitting: Family Medicine

## 2017-11-04 DIAGNOSIS — E1142 Type 2 diabetes mellitus with diabetic polyneuropathy: Secondary | ICD-10-CM

## 2017-11-04 DIAGNOSIS — E0800 Diabetes mellitus due to underlying condition with hyperosmolarity without nonketotic hyperglycemic-hyperosmolar coma (NKHHC): Secondary | ICD-10-CM

## 2017-11-19 NOTE — Telephone Encounter (Signed)
Open in error

## 2017-11-24 DIAGNOSIS — L97512 Non-pressure chronic ulcer of other part of right foot with fat layer exposed: Secondary | ICD-10-CM | POA: Diagnosis not present

## 2017-11-24 DIAGNOSIS — L97521 Non-pressure chronic ulcer of other part of left foot limited to breakdown of skin: Secondary | ICD-10-CM | POA: Diagnosis not present

## 2017-11-24 DIAGNOSIS — E1140 Type 2 diabetes mellitus with diabetic neuropathy, unspecified: Secondary | ICD-10-CM | POA: Diagnosis not present

## 2017-12-08 ENCOUNTER — Other Ambulatory Visit: Payer: Self-pay | Admitting: Family Medicine

## 2017-12-08 DIAGNOSIS — I2510 Atherosclerotic heart disease of native coronary artery without angina pectoris: Secondary | ICD-10-CM

## 2017-12-09 DIAGNOSIS — L97521 Non-pressure chronic ulcer of other part of left foot limited to breakdown of skin: Secondary | ICD-10-CM | POA: Diagnosis not present

## 2017-12-09 DIAGNOSIS — Q6689 Other  specified congenital deformities of feet: Secondary | ICD-10-CM | POA: Diagnosis not present

## 2017-12-09 DIAGNOSIS — L97511 Non-pressure chronic ulcer of other part of right foot limited to breakdown of skin: Secondary | ICD-10-CM | POA: Diagnosis not present

## 2018-01-04 DIAGNOSIS — L97521 Non-pressure chronic ulcer of other part of left foot limited to breakdown of skin: Secondary | ICD-10-CM | POA: Diagnosis not present

## 2018-01-08 ENCOUNTER — Encounter

## 2018-01-08 ENCOUNTER — Encounter (INDEPENDENT_AMBULATORY_CARE_PROVIDER_SITE_OTHER): Payer: Self-pay | Admitting: Nurse Practitioner

## 2018-01-08 ENCOUNTER — Ambulatory Visit (INDEPENDENT_AMBULATORY_CARE_PROVIDER_SITE_OTHER): Payer: Medicare Other

## 2018-01-08 ENCOUNTER — Ambulatory Visit (INDEPENDENT_AMBULATORY_CARE_PROVIDER_SITE_OTHER): Payer: Medicare Other | Admitting: Nurse Practitioner

## 2018-01-08 VITALS — BP 156/71 | HR 61 | Resp 18 | Ht 70.0 in | Wt 164.0 lb

## 2018-01-08 DIAGNOSIS — M7989 Other specified soft tissue disorders: Secondary | ICD-10-CM

## 2018-01-08 DIAGNOSIS — R600 Localized edema: Secondary | ICD-10-CM | POA: Diagnosis not present

## 2018-01-08 DIAGNOSIS — I70213 Atherosclerosis of native arteries of extremities with intermittent claudication, bilateral legs: Secondary | ICD-10-CM

## 2018-01-10 ENCOUNTER — Encounter (INDEPENDENT_AMBULATORY_CARE_PROVIDER_SITE_OTHER): Payer: Self-pay | Admitting: Nurse Practitioner

## 2018-01-10 NOTE — Progress Notes (Signed)
Patient left following his ultrasound prior to being seen.

## 2018-01-11 DIAGNOSIS — I208 Other forms of angina pectoris: Secondary | ICD-10-CM | POA: Diagnosis not present

## 2018-01-11 DIAGNOSIS — R600 Localized edema: Secondary | ICD-10-CM | POA: Diagnosis not present

## 2018-01-11 DIAGNOSIS — R0602 Shortness of breath: Secondary | ICD-10-CM | POA: Diagnosis not present

## 2018-01-11 DIAGNOSIS — I2510 Atherosclerotic heart disease of native coronary artery without angina pectoris: Secondary | ICD-10-CM | POA: Diagnosis not present

## 2018-01-11 DIAGNOSIS — I509 Heart failure, unspecified: Secondary | ICD-10-CM | POA: Diagnosis not present

## 2018-01-25 ENCOUNTER — Ambulatory Visit: Payer: Medicare Other | Admitting: Family Medicine

## 2018-01-29 ENCOUNTER — Encounter: Payer: Self-pay | Admitting: Family Medicine

## 2018-01-29 ENCOUNTER — Other Ambulatory Visit: Payer: Self-pay | Admitting: Family Medicine

## 2018-01-29 ENCOUNTER — Ambulatory Visit (INDEPENDENT_AMBULATORY_CARE_PROVIDER_SITE_OTHER): Payer: Medicare Other | Admitting: Family Medicine

## 2018-01-29 VITALS — BP 143/60 | HR 62 | Temp 97.5°F | Resp 16 | Ht 71.0 in | Wt 172.0 lb

## 2018-01-29 DIAGNOSIS — I872 Venous insufficiency (chronic) (peripheral): Secondary | ICD-10-CM

## 2018-01-29 DIAGNOSIS — E7800 Pure hypercholesterolemia, unspecified: Secondary | ICD-10-CM

## 2018-01-29 DIAGNOSIS — Z23 Encounter for immunization: Secondary | ICD-10-CM

## 2018-01-29 DIAGNOSIS — E1142 Type 2 diabetes mellitus with diabetic polyneuropathy: Secondary | ICD-10-CM

## 2018-01-29 DIAGNOSIS — R351 Nocturia: Secondary | ICD-10-CM

## 2018-01-29 DIAGNOSIS — N183 Chronic kidney disease, stage 3 unspecified: Secondary | ICD-10-CM

## 2018-01-29 DIAGNOSIS — I739 Peripheral vascular disease, unspecified: Secondary | ICD-10-CM

## 2018-01-29 DIAGNOSIS — I10 Essential (primary) hypertension: Secondary | ICD-10-CM

## 2018-01-29 DIAGNOSIS — N401 Enlarged prostate with lower urinary tract symptoms: Secondary | ICD-10-CM

## 2018-01-29 DIAGNOSIS — Z0000 Encounter for general adult medical examination without abnormal findings: Secondary | ICD-10-CM

## 2018-01-29 DIAGNOSIS — G4701 Insomnia due to medical condition: Secondary | ICD-10-CM

## 2018-01-29 DIAGNOSIS — M159 Polyosteoarthritis, unspecified: Secondary | ICD-10-CM

## 2018-01-29 DIAGNOSIS — M150 Primary generalized (osteo)arthritis: Secondary | ICD-10-CM

## 2018-01-29 LAB — POCT GLYCOSYLATED HEMOGLOBIN (HGB A1C): HEMOGLOBIN A1C: 7.7 % — AB (ref 4.0–5.6)

## 2018-01-29 NOTE — Assessment & Plan Note (Signed)
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

## 2018-01-29 NOTE — Patient Instructions (Addendum)
Thank you for coming to the office today.  [place], [place] [address] Open until 5PM Phone: [phone] - Diabetic Shoe Fitting - bring paperwork and they will fax it to [place] to sign off on  A1c 7.7 - improved reading.  Flu shot today  DUE for FASTING BLOOD WORK (no food or drink after midnight before the lab appointment, only water or coffee without cream/sugar on the morning of)  SCHEDULE "Lab Only" visit in the morning at the clinic for lab draw in 5 MONTHS   - Make sure Lab Only appointment is at about 1 week before your next appointment, so that results will be available  For Lab Results, once available within 2-3 days of blood draw, you can can log in to MyChart online to view your results and a brief explanation. Also, we can discuss results at next follow-up visit.  Marland Kitchen Please schedule a Follow-up Appointment to: Return in about 5 months (around 06/30/2018) for Annual Physical.  If you have any other questions or concerns, please feel free to call the office or send a message through [place]. You may also schedule an earlier appointment if necessary.  Additionally, you may be receiving a survey about your experience at our office within a few days to 1 week by e-mail or mail. We value your feedback.  [name], DO [place] [facility]

## 2018-01-29 NOTE — Assessment & Plan Note (Signed)
Stable to improved CKD-III from prior readings Secondary to DM, HTN, age Avoid NSAIDs regularly' Improve hydration 

## 2018-01-29 NOTE — Assessment & Plan Note (Signed)
Significant PAD extremities, previous interventions Followed by Vascular Surgery / Cardiology Has had prior ABI Current foot ulceration healing per Podiatry Continue on medical management

## 2018-01-29 NOTE — Progress Notes (Signed)
Subjective:    Patient ID: Randy Howard, male    DOB: [birth year], 77 y.o.   MRN: [id number]  Randy Howard is a 77 y.o. 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 Meds:Glimepiride 4mg  daily in AM Reports good compliance. Tolerating well w/o side-effects Currently on ACEi Rare hypoglycemia symptoms admits he feels shakiness and weaker, will take glucose tablet PRN, rarely Due for DM Eye exam - he needs to schedule w/ [facility] Admits numbness feet, neuropathy, ulceration - followed by Dr Sharlotte Alamo at [facility] Denies polyuria, visual changes  CHRONIC HTN / PAD / Venous Insufficiency Edema / CKD-III Followed by Cardiology, Vascular Surgery Recent BP readings normal Updates with med change off Furosemide now on Torsemide. Improved edema Current Meds - Benazepril 40mg  daily, Metoprolol 50mg  BID, Torsemide Reports good compliance, took meds today. Tolerating well, w/o complaints. 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 - Taking Baclofen 10mg  daily in AM- request refill - Taking Gabapentin 100mg  capsules x 2 in morningand 1-2 PM - Taking Tramadol 50mg  in morning- doing well on this medstil - not due for refill - Cannot take NSAIDs, not taking Tylenol regularly  Health Maintenance: Due for Flu Shot, will receive today    Depression screen [facility] 2/9 01/29/2018 11/12/2016 04/28/2016  Decreased Interest 1 1 0  Down, Depressed, Hopeless 0 2 0  PHQ - 2 Score 1 3 0    Altered sleeping 1 3 -  Tired, decreased energy 1 3 -  Change in appetite 0 3 -  Feeling bad or failure about yourself  0 0 -  Trouble concentrating 1 2 -  Moving slowly or fidgety/restless 0 1 -  Suicidal thoughts 0 0 -  PHQ-9 Score 4 15 -  Difficult doing work/chores Not difficult at all Somewhat difficult -    Social History   Tobacco Use  . Smoking status: Former Smoker    Packs/day: 1.00    Years: 25.00    Pack years: 25.00    Types: Cigarettes    Last attempt to quit: 03/24/1985    Years since quitting: 32.8  . Smokeless tobacco: Never Used  Substance Use Topics  . Alcohol use: No    Alcohol/week: 0.0 standard drinks  . Drug use: No    Review of Systems Per HPI unless specifically indicated above      Objective:    BP (!) 143/60 (BP Location: Right Arm, Patient Position: Sitting, Cuff Size: Normal)   Pulse 62   Temp (!) 97.5 F (36.4 C)   Resp 16   Ht 5\' 11"  (1.803 m)   Wt 172 lb (78 kg)   SpO2 98%   BMI 23.99 kg/m   Wt Readings from Last 3 Encounters:  01/29/18 172 lb (78 kg)  01/08/18 164 lb (74.4 kg)  09/01/17 163 lb (73.9 kg)    Physical Exam  Constitutional: He is oriented to person, place, and time. He appears well-developed and well-nourished. No distress.  Chronically ill but currently well-appearing 77 year old male, comfortable,  cooperative  HENT:  Head: Normocephalic and atraumatic.  Mouth/Throat: Oropharynx is clear and moist.  Eyes: Conjunctivae are normal. Right eye exhibits no discharge. Left eye exhibits no discharge.  Cardiovascular: Normal rate, regular rhythm, normal heart sounds and intact distal pulses.  No murmur heard. Pulmonary/Chest: Effort normal.  Musculoskeletal: He exhibits edema (Improved edema, now trace bilateral feet).  Neurological: He is alert and oriented to person, place, and time. Coordination normal.  Distal sensation to light touch is reduced lower extremity feet Gait is cautious but appropriate, has  cane  Has some generalized tremor most of body at baseline  Skin: Skin is warm and dry. No rash noted. He is not diaphoretic. No erythema.  Bilateral lower extremities feet ankles with some evidence of chronic venous changes and PAD with some color change stasis dermatitis  Feet with chronic calluses multiple areas including between toes some chronic ulceration w/o sign of active infection  Psychiatric: He has a normal mood and affect. His behavior is normal.  Nursing note and vitals reviewed.    Diabetic Foot Exam - Simple   Simple Foot Form Diabetic Foot exam was performed with the following findings:  Yes 01/29/2018  2:40 PM  Visual Inspection See comments:  Yes Sensation Testing See comments:  Yes Pulse Check Posterior Tibialis and Dorsalis pulse intact bilaterally:  Yes Comments Left foot with forefoot deep base ulceration with healing tissue 2 x 2 cm approx without granulation tissue or drainage, or erythema. Callus formation multiple areas heels and forefoot great toe. Significantly reduced sensation to monofilament sensation diffusely, reduced light touch. Dry flaking skin.    Recent Labs    06/01/17 0822 01/29/18 1527  HGBA1C 7.9* 7.7*    Results for orders placed or performed in visit on 01/29/18  POCT glycosylated hemoglobin (Hb A1C)  Result Value Ref Range   Hemoglobin A1C 7.7 (A) 4.0 - 5.6 %      Assessment & Plan:   Problem List Items Addressed This Visit    Chronic venous insufficiency    Secondary edema from venous insufficiency Also underlying PAD Follow up with Vascular      CKD (chronic kidney disease), stage III (HCC)    Stable to improved CKD-III from prior readings Secondary to DM, HTN, age Avoid NSAIDs regularly' Improve hydration      Hypertension    HTN controlled on current regimen CKD-III complication Continue current meds including diuretic per Cardiology/Vascular      Peripheral vascular disease ([place])    Significant PAD  extremities, previous interventions Followed by Vascular Surgery / Cardiology Has had prior ABI Current foot ulceration healing per Podiatry Continue on medical management      Type 2 diabetes mellitus with diabetic polyneuropathy, without long-term current use of insulin (HCC) - Primary    Stable DM, A1c 7.7, with some history of hyperglycemia Rare episodes of hypoglycemia, complication Complications - CKD-III, peripheral neuropathy, DM retinopathy, other including hyperlipidemia - increases risk of future cardiovascular complications  Plan:  1. Continue current therapy - Glimepiride 4mg  daily - caution hypoglycemia 2. Encourage improved lifestyle - low carb, low sugar diet, reduce portion size, continue improving regular exercise as tolerated 3. Check CBG, bring log to next visit for review 4. Continue ACEi, Statin 5. DM Foot exam done today / Advised to schedule DM ophtho exam, send record 6. Follow-up 6 months for annual  Patient would benefit from diabetic shoes due to neuropathy with callus formation, and history of ulceration with healing ulcer currently. Requesting  order for DM shoes now, and will submit order form and rx to medical supply store, [facility] for further processing. He also has Podiatry (Dr Cleda Mccreedy) and Vascular specialist.      Relevant Orders   POCT glycosylated hemoglobin (Hb A1C) (Completed)    Other Visit Diagnoses    Needs flu shot       Relevant Orders   Flu vaccine HIGH DOSE PF (Completed)      No orders of the defined types were placed in this encounter.   Follow up plan: Return in about 5 months (around 06/30/2018) for Annual Physical.  Future labs ordered for 06/25/18  [name], [facility] 01/29/2018, 3:27 PM

## 2018-01-29 NOTE — Assessment & Plan Note (Addendum)
Stable DM, A1c 7.7, with some history of hyperglycemia Rare episodes of hypoglycemia, complication Complications - CKD-III, peripheral neuropathy, DM retinopathy, other including hyperlipidemia - increases risk of future cardiovascular complications  Plan:  1. Continue current therapy - Glimepiride 4mg  daily - caution hypoglycemia 2. Encourage improved lifestyle - low carb, low sugar diet, reduce portion size, continue improving regular exercise as tolerated 3. Check CBG, bring log to next visit for review 4. Continue ACEi, Statin 5. DM Foot exam done today / Advised to schedule DM ophtho exam, send record 6. Follow-up 6 months for annual  Patient would benefit from diabetic shoes due to neuropathy with callus formation, and history of ulceration with healing ulcer currently. Requesting order for DM shoes now, and will submit order form and rx to medical supply store, [facility] for further processing. He also has Podiatry (Dr Cleda Mccreedy) and Vascular specialist.

## 2018-01-29 NOTE — Assessment & Plan Note (Signed)
HTN controlled on current regimen CKD-III complication Continue current meds including diuretic per Cardiology/Vascular

## 2018-02-05 DIAGNOSIS — L97522 Non-pressure chronic ulcer of other part of left foot with fat layer exposed: Secondary | ICD-10-CM | POA: Diagnosis not present

## 2018-02-16 ENCOUNTER — Encounter (INDEPENDENT_AMBULATORY_CARE_PROVIDER_SITE_OTHER): Payer: Medicare Other

## 2018-02-16 ENCOUNTER — Ambulatory Visit (INDEPENDENT_AMBULATORY_CARE_PROVIDER_SITE_OTHER): Payer: Medicare Other | Admitting: Vascular Surgery

## 2018-02-16 ENCOUNTER — Encounter

## 2018-03-08 DIAGNOSIS — B351 Tinea unguium: Secondary | ICD-10-CM | POA: Diagnosis not present

## 2018-03-08 DIAGNOSIS — L97521 Non-pressure chronic ulcer of other part of left foot limited to breakdown of skin: Secondary | ICD-10-CM | POA: Diagnosis not present

## 2018-03-08 DIAGNOSIS — E1140 Type 2 diabetes mellitus with diabetic neuropathy, unspecified: Secondary | ICD-10-CM | POA: Diagnosis not present

## 2018-03-08 DIAGNOSIS — L851 Acquired keratosis [keratoderma] palmaris et plantaris: Secondary | ICD-10-CM | POA: Diagnosis not present

## 2018-04-05 DIAGNOSIS — E1140 Type 2 diabetes mellitus with diabetic neuropathy, unspecified: Secondary | ICD-10-CM | POA: Diagnosis not present

## 2018-04-05 DIAGNOSIS — L97521 Non-pressure chronic ulcer of other part of left foot limited to breakdown of skin: Secondary | ICD-10-CM | POA: Diagnosis not present

## 2018-04-05 DIAGNOSIS — L97522 Non-pressure chronic ulcer of other part of left foot with fat layer exposed: Secondary | ICD-10-CM | POA: Diagnosis not present

## 2018-04-06 ENCOUNTER — Ambulatory Visit (INDEPENDENT_AMBULATORY_CARE_PROVIDER_SITE_OTHER): Payer: Medicare Other | Admitting: Vascular Surgery

## 2018-04-16 ENCOUNTER — Ambulatory Visit (INDEPENDENT_AMBULATORY_CARE_PROVIDER_SITE_OTHER): Payer: Medicare Other | Admitting: Vascular Surgery

## 2018-04-16 ENCOUNTER — Encounter (INDEPENDENT_AMBULATORY_CARE_PROVIDER_SITE_OTHER): Payer: Self-pay | Admitting: Vascular Surgery

## 2018-04-16 ENCOUNTER — Encounter (INDEPENDENT_AMBULATORY_CARE_PROVIDER_SITE_OTHER): Payer: Self-pay

## 2018-04-16 VITALS — BP 148/87 | HR 66 | Resp 16 | Ht 71.0 in | Wt 175.2 lb

## 2018-04-16 DIAGNOSIS — R600 Localized edema: Secondary | ICD-10-CM | POA: Diagnosis not present

## 2018-04-16 DIAGNOSIS — E1142 Type 2 diabetes mellitus with diabetic polyneuropathy: Secondary | ICD-10-CM

## 2018-04-16 DIAGNOSIS — I10 Essential (primary) hypertension: Secondary | ICD-10-CM

## 2018-04-16 DIAGNOSIS — L97529 Non-pressure chronic ulcer of other part of left foot with unspecified severity: Secondary | ICD-10-CM

## 2018-04-16 DIAGNOSIS — I70201 Unspecified atherosclerosis of native arteries of extremities, right leg: Secondary | ICD-10-CM | POA: Diagnosis not present

## 2018-04-16 DIAGNOSIS — I70245 Atherosclerosis of native arteries of left leg with ulceration of other part of foot: Secondary | ICD-10-CM | POA: Diagnosis not present

## 2018-04-16 DIAGNOSIS — Z87891 Personal history of nicotine dependence: Secondary | ICD-10-CM

## 2018-04-16 DIAGNOSIS — E7800 Pure hypercholesterolemia, unspecified: Secondary | ICD-10-CM

## 2018-04-16 DIAGNOSIS — M7989 Other specified soft tissue disorders: Secondary | ICD-10-CM

## 2018-04-16 DIAGNOSIS — I7025 Atherosclerosis of native arteries of other extremities with ulceration: Secondary | ICD-10-CM

## 2018-04-16 NOTE — Assessment & Plan Note (Signed)
Recommend:  The patient has evidence of severe atherosclerotic changes of both lower extremities associated with ulceration and tissue loss of the foot on the left.  This represents a limb threatening ischemia and places the patient at the risk for limb loss.  Patient should undergo angiography of the left lower extremity with the hope for intervention for limb salvage.  The risks and benefits as well as the alternative therapies was discussed in detail with the patient.  All questions were answered.  Patient agrees to proceed with angiography.  The patient will follow up with me in the office after the procedure.

## 2018-04-16 NOTE — Assessment & Plan Note (Signed)
Recommend compression and elevation.  Is already on diuretic therapy.

## 2018-04-16 NOTE — Progress Notes (Signed)
MRN : [id number]  Randy Howard is a 78 y.o. (20-Oct-1940) male who presents with chief complaint of  Chief Complaint  Patient presents with  . Foot Problem    left foot, open wound since July  .  History of Present Illness: Patient returns today in follow up on referral from Dr. Cleda Mccreedy.  The patient has a long history of peripheral vascular disease and is status post interventions bilaterally in years past.  His most recent surgery was a bilateral femoral endarterectomy in 2016.  The patient has not followed up as regularly as would be liked, and the last arterial studies I see were from 2018.  At that time, his right ABI was 0.57 and his left ABI was 0.60.  He had no limb threatening symptoms at that point.  Now, he has developed an ulceration on the base of his left foot.  He says he stepped on something and it has left quite a wound.  It has had infection and Dr. Caryl Comes has had to do multiple procedures on this in the office.  It is quite painful.  He denies any current fevers or chills.  He still has swelling in both legs and is actually a little worse on the right than the left.  Current Outpatient Medications  Medication Sig Dispense Refill  . baclofen (LIORESAL) 10 MG tablet TAKE 1 TABLET BY MOUTH THREE TIMES DAILYAS NEEDED 90 each 3  . benazepril (LOTENSIN) 40 MG tablet TAKE 1 TABLET BY MOUTH ONCE DAILY 90 tablet 3  . busPIRone (BUSPAR) 5 MG tablet Take 1 tablet (5 mg total) by mouth daily as needed. 90 tablet 3  . clopidogrel (PLAVIX) 75 MG tablet TAKE 1 TABLET BY MOUTH ONCE DAILY 90 tablet 3  . finasteride (PROSCAR) 5 MG tablet Take 1 tablet (5 mg total) by mouth daily. 90 tablet 3  . gabapentin (NEURONTIN) 100 MG capsule Take 2 capsules in AM and 1-2 capsules at bedtime 360 capsule 3  . glimepiride (AMARYL) 4 MG tablet TAKE 1 TABLET BY MOUTH EVERY MORNING 90 tablet 1  . hydrOXYzine (ATARAX/VISTARIL) 10 MG tablet Take 1 tablet (10 mg total) by mouth 3 (three) times daily as needed.  90 tablet 3  . lovastatin (MEVACOR) 40 MG tablet Take by mouth.    . Magnesium Oxide, Antacid, 500 MG CAPS Take by mouth.    . meclizine (ANTIVERT) 25 MG tablet Take 1 tablet (25 mg total) by mouth daily as needed for dizziness. 90 tablet 3  . metoprolol tartrate (LOPRESSOR) 50 MG tablet TAKE 1 TABLET BY MOUTH TWICE DAILY 180 tablet 1  . mirtazapine (REMERON) 15 MG tablet     . nitroGLYCERIN (NITROSTAT) 0.4 MG SL tablet Place 1 tablet (0.4 mg total) under the tongue every 5 (five) minutes as needed for chest pain. 50 tablet 3  . Potassium 99 MG TABS Take by mouth.    . tamsulosin (FLOMAX) 0.4 MG CAPS capsule TAKE 1 CAPSULE BY MOUTH ONCE DAILY 90 capsule 1  . torsemide (DEMADEX) 20 MG tablet TAKE 20 MG BY MOUTH TWICE DAILY FOR 3 DAYS, THEN 20 MG DAILY THEREAFTER    . traMADol (ULTRAM) 50 MG tablet TAKE 1 TABLET BY MOUTH EVERY 12 HOURS ASNEEDED FOR MODERATE PAIN 30 tablet 2  . triamcinolone ointment (KENALOG) 0.1 % Apply topically.     No current facility-administered medications for this visit.     Past Medical History:  Diagnosis Date  . Arthritis   . Coronary  artery disease   . Elevated lipids   . Hyperlipidemia   . Hypertension   . Neuropathy   . Nocturia   . Obstructive sleep apnea   . Peripheral vascular disease ([place])   . Prostate enlargement   . PVD (peripheral vascular disease) ([place])   . Right carotid bruit   . Skin cancer    Followed by Dr. Evorn Gong  . Skin cancer   . Tremor     Past Surgical History:  Procedure Laterality Date  . CARDIAC CATHETERIZATION    . CAROTID ARTERY ANGIOPLASTY Left 09/30/2011  . CORONARY ARTERY BYPASS GRAFT  2006  . ENDARTERECTOMY FEMORAL Bilateral 11/01/2014   Procedure: ENDARTERECTOMY FEMORAL;  Surgeon: Algernon Huxley, MD;  Location: [facility] ORS;  Service: Vascular;  Laterality: Bilateral;  . PERIPHERAL VASCULAR CATHETERIZATION Left 10/09/2014   Procedure: Lower Extremity Angiography;  Surgeon: Algernon Huxley, MD;  Location: [place] [facility];   Service: Cardiovascular;  Laterality: Left;  . PERIPHERAL VASCULAR CATHETERIZATION  10/09/2014   Procedure: Lower Extremity Intervention;  Surgeon: Algernon Huxley, MD;  Location: [place] [facility];  Service: Cardiovascular;;    Social History Social History   Tobacco Use  . Smoking status: Former Smoker    Packs/day: 1.00    Years: 25.00    Pack years: 25.00    Types: Cigarettes    Last attempt to quit: 03/24/1985    Years since quitting: 33.0  . Smokeless tobacco: Never Used  Substance Use Topics  . Alcohol use: No    Alcohol/week: 0.0 standard drinks  . Drug use: No    Family History Family History  Problem Relation Age of Onset  . Heart disease Sister   . Heart disease Sister   . Diabetes Sister   . Diabetes Sister   . Diabetes Brother     Allergies  Allergen Reactions  . Oxycodone Nausea Only     REVIEW OF SYSTEMS (Negative unless checked)  Constitutional: [] Weight loss  [] Fever  [] Chills Cardiac: [] Chest pain   [] Chest pressure   [] Palpitations   [] Shortness of breath when laying flat   [] Shortness of breath at rest   [] Shortness of breath with exertion. Vascular:  [x] Pain in legs with walking   [x] Pain in legs at rest   [] Pain in legs when laying flat   [x] Claudication   [] Pain in feet when walking  [] Pain in feet at rest  [] Pain in feet when laying flat   [] History of DVT   [] Phlebitis   [] Swelling in legs   [] Varicose veins   [x] Non-healing ulcers Pulmonary:   [] Uses home oxygen   [] Productive cough   [] Hemoptysis   [] Wheeze  [] COPD   [] Asthma Neurologic:  [] Dizziness  [] Blackouts   [] Seizures   [] History of stroke   [] History of TIA  [] Aphasia   [] Temporary blindness   [] Dysphagia   [] Weakness or numbness in arms   [x] Weakness or numbness in legs Musculoskeletal:  [x] Arthritis   [] Joint swelling   [] Joint pain   [] Low back pain Hematologic:  [] Easy bruising  [] Easy bleeding   [] Hypercoagulable state   [] Anemic   Gastrointestinal:  [] Blood in stool   [] Vomiting  blood  [] Gastroesophageal reflux/heartburn   [] Abdominal pain Genitourinary:  [] Chronic kidney disease   [] Difficult urination  [] Frequent urination  [] Burning with urination   [] Hematuria Skin:  [] Rashes   [x] Ulcers   [x] Wounds Psychological:  [] History of anxiety   []  History of major depression.  Physical Examination  BP (!) 148/87 (  BP Location: Right Arm, Patient Position: Sitting, Cuff Size: Normal)   Pulse 66   Resp 16   Ht 5\' 11"  (1.803 m)   Wt 175 lb 3.2 oz (79.5 kg)   BMI 24.44 kg/m  Gen:  WD/WN, NAD Head: [place]/AT, No temporalis wasting. Ear/Nose/Throat: Hearing diminshed, nares w/o erythema or drainage Eyes: Conjunctiva clear. Sclera non-icteric Neck: Supple.  Trachea midline Pulmonary:  Good air movement, no use of accessory muscles.  Cardiac: Irregular Vascular:  Vessel Right Left  Radial Palpable Palpable                          PT Not Palpable Not Palpable  DP 1+ Palpable Trace Palpable   Gastrointestinal: soft, non-tender/non-distended. Musculoskeletal: M/S 5/5 throughout.  Pale ulcer on the base of the left foot.  2+ right lower extremity edema.  1+ left lower extremity edema Neurologic: Sensation decreased in extremities.  Symmetrical.  Speech is fluent.  Psychiatric: Judgment intact, Mood & affect appropriate for pt's clinical situation. Dermatologic: Pale ulcer on the base of the left foot.       Labs Recent Results (from the past 2160 hour(s))  POCT glycosylated hemoglobin (Hb A1C)     Status: Abnormal   Collection Time: 01/29/18  3:27 PM  Result Value Ref Range   Hemoglobin A1C 7.7 (A) 4.0 - 5.6 %    Radiology No results found.  Assessment/Plan  Swelling of limb Recommend compression and elevation.  Is already on diuretic therapy.  Elevated cholesterol lipid control important in reducing the progression of atherosclerotic disease. Continue statin therapy   Type 2 diabetes mellitus with diabetic polyneuropathy, without long-term  current use of insulin (HCC) blood glucose control important in reducing the progression of atherosclerotic disease. Also, involved in wound healing. On appropriate medications.   Hypertension blood pressure control important in reducing the progression of atherosclerotic disease. On appropriate oral medications.   Atherosclerosis of native arteries of the extremities with ulceration ([place])  Recommend:  The patient has evidence of severe atherosclerotic changes of both lower extremities associated with ulceration and tissue loss of the foot on the left.  This represents a limb threatening ischemia and places the patient at the risk for limb loss.  Patient should undergo angiography of the left lower extremity with the hope for intervention for limb salvage.  The risks and benefits as well as the alternative therapies was discussed in detail with the patient.  All questions were answered.  Patient agrees to proceed with angiography.  The patient will follow up with me in the office after the procedure.      Leotis Pain, MD  04/16/2018 2:59 PM    This note was created with Dragon medical transcription system.  Any errors from dictation are purely unintentional

## 2018-04-16 NOTE — Patient Instructions (Signed)
Peripheral Vascular Disease  Peripheral vascular disease (PVD) is a disease of the blood vessels that are not part of your heart and brain. A simple term for PVD is poor circulation. In most cases, PVD narrows the blood vessels that carry blood from your heart to the rest of your body. This can reduce the supply of blood to your arms, legs, and internal organs, like your stomach or kidneys. However, PVD most often affects a person's lower legs and feet. Without treatment, PVD tends to get worse. PVD can also lead to acute ischemic limb. This is when an arm or leg suddenly cannot get enough blood. This is a medical emergency. Follow these instructions at home: Lifestyle  Do not use any products that contain nicotine or tobacco, such as cigarettes and e-cigarettes. If you need help quitting, ask your doctor.  Lose weight if you are overweight. Or, stay at a healthy weight as told by your doctor.  Eat a diet that is low in fat and cholesterol. If you need help, ask your doctor.  Exercise regularly. Ask your doctor for activities that are right for you. General instructions  Take over-the-counter and prescription medicines only as told by your doctor.  Take good care of your feet: ? Wear comfortable shoes that fit well. ? Check your feet often for any cuts or sores.  Keep all follow-up visits as told by your doctor This is important. Contact a doctor if:  You have cramps in your legs when you walk.  You have leg pain when you are at rest.  You have coldness in a leg or foot.  Your skin changes.  You are unable to get or have an erection (erectile dysfunction).  You have cuts or sores on your feet that do not heal. Get help right away if:  Your arm or leg turns cold, numb, and blue.  Your arms or legs become red, warm, swollen, painful, or numb.  You have chest pain.  You have trouble breathing.  You suddenly have weakness in your face, arm, or leg.  You become very  confused or you cannot speak.  You suddenly have a very bad headache.  You suddenly cannot see. Summary  Peripheral vascular disease (PVD) is a disease of the blood vessels.  A simple term for PVD is poor circulation. Without treatment, PVD tends to get worse.  Treatment may include exercise, low fat and low cholesterol diet, and quitting smoking. This information is not intended to replace advice given to you by your health care provider. Make sure you discuss any questions you have with your health care provider. Document Released: 06/04/2009 Document Revised: 04/17/2016 Document Reviewed: 04/17/2016 Elsevier Interactive Patient Education  2019 [employer]  

## 2018-04-16 NOTE — Assessment & Plan Note (Signed)
lipid control important in reducing the progression of atherosclerotic disease. Continue statin therapy  

## 2018-04-16 NOTE — Assessment & Plan Note (Signed)
blood glucose control important in reducing the progression of atherosclerotic disease. Also, involved in wound healing. On appropriate medications.  

## 2018-04-16 NOTE — Assessment & Plan Note (Signed)
blood pressure control important in reducing the progression of atherosclerotic disease. On appropriate oral medications.  

## 2018-04-23 ENCOUNTER — Other Ambulatory Visit (INDEPENDENT_AMBULATORY_CARE_PROVIDER_SITE_OTHER): Payer: Self-pay | Admitting: Nurse Practitioner

## 2018-04-25 MED ORDER — DEXTROSE 5 % IV SOLN
2.0000 g | Freq: Once | INTRAVENOUS | Status: AC
  Administered 2018-04-26: 2 g via INTRAVENOUS
  Filled 2018-04-25: qty 20

## 2018-04-26 ENCOUNTER — Other Ambulatory Visit: Payer: Self-pay

## 2018-04-26 ENCOUNTER — Ambulatory Visit
Admission: RE | Admit: 2018-04-26 | Discharge: 2018-04-26 | Disposition: A | Discharge status: Home / Self Care | Payer: Medicare Other | Attending: Vascular Surgery | Admitting: Vascular Surgery

## 2018-04-26 ENCOUNTER — Encounter
Admission: RE | Disposition: A | Discharge status: Home / Self Care | Payer: Self-pay | Source: Home / Self Care | Attending: Vascular Surgery

## 2018-04-26 DIAGNOSIS — E1140 Type 2 diabetes mellitus with diabetic neuropathy, unspecified: Secondary | ICD-10-CM | POA: Insufficient documentation

## 2018-04-26 DIAGNOSIS — E785 Hyperlipidemia, unspecified: Secondary | ICD-10-CM | POA: Diagnosis not present

## 2018-04-26 DIAGNOSIS — I2510 Atherosclerotic heart disease of native coronary artery without angina pectoris: Secondary | ICD-10-CM | POA: Diagnosis not present

## 2018-04-26 DIAGNOSIS — G4733 Obstructive sleep apnea (adult) (pediatric): Secondary | ICD-10-CM | POA: Diagnosis not present

## 2018-04-26 DIAGNOSIS — I70219 Atherosclerosis of native arteries of extremities with intermittent claudication, unspecified extremity: Secondary | ICD-10-CM

## 2018-04-26 DIAGNOSIS — Z7984 Long term (current) use of oral hypoglycemic drugs: Secondary | ICD-10-CM | POA: Insufficient documentation

## 2018-04-26 DIAGNOSIS — I10 Essential (primary) hypertension: Secondary | ICD-10-CM | POA: Diagnosis not present

## 2018-04-26 DIAGNOSIS — M7989 Other specified soft tissue disorders: Secondary | ICD-10-CM | POA: Diagnosis not present

## 2018-04-26 DIAGNOSIS — Z79899 Other long term (current) drug therapy: Secondary | ICD-10-CM | POA: Insufficient documentation

## 2018-04-26 DIAGNOSIS — I70245 Atherosclerosis of native arteries of left leg with ulceration of other part of foot: Secondary | ICD-10-CM | POA: Diagnosis not present

## 2018-04-26 DIAGNOSIS — L97529 Non-pressure chronic ulcer of other part of left foot with unspecified severity: Secondary | ICD-10-CM | POA: Insufficient documentation

## 2018-04-26 DIAGNOSIS — E11621 Type 2 diabetes mellitus with foot ulcer: Secondary | ICD-10-CM | POA: Insufficient documentation

## 2018-04-26 DIAGNOSIS — Z87891 Personal history of nicotine dependence: Secondary | ICD-10-CM | POA: Diagnosis not present

## 2018-04-26 DIAGNOSIS — E1151 Type 2 diabetes mellitus with diabetic peripheral angiopathy without gangrene: Secondary | ICD-10-CM | POA: Diagnosis not present

## 2018-04-26 DIAGNOSIS — I7092 Chronic total occlusion of artery of the extremities: Secondary | ICD-10-CM | POA: Diagnosis not present

## 2018-04-26 DIAGNOSIS — E7800 Pure hypercholesterolemia, unspecified: Secondary | ICD-10-CM | POA: Insufficient documentation

## 2018-04-26 HISTORY — PX: LOWER EXTREMITY ANGIOGRAPHY: CATH118251

## 2018-04-26 LAB — BUN: BUN: 21 mg/dL (ref 8–23)

## 2018-04-26 LAB — CREATININE, SERUM
Creatinine, Ser: 1.5 mg/dL — ABNORMAL HIGH (ref 0.61–1.24)
GFR calc Af Amer: 51 mL/min — ABNORMAL LOW (ref >60)
GFR calc non Af Amer: 44 mL/min — ABNORMAL LOW (ref >60)

## 2018-04-26 LAB — GLUCOSE, CAPILLARY
GLUCOSE-CAPILLARY: 172 mg/dL — AB (ref 70–99)
Glucose-Capillary: 166 mg/dL — ABNORMAL HIGH (ref 70–99)

## 2018-04-26 SURGERY — LOWER EXTREMITY ANGIOGRAPHY
Anesthesia: Moderate Sedation | Site: Leg Lower | Laterality: Left

## 2018-04-26 MED ORDER — ONDANSETRON HCL 4 MG/2ML IJ SOLN
4.0000 mg | Freq: Four times a day (QID) | INTRAMUSCULAR | Status: DC | PRN
  Administered 2018-04-26: 4 mg via INTRAVENOUS

## 2018-04-26 MED ORDER — ONDANSETRON HCL 4 MG/2ML IJ SOLN
4.0000 mg | Freq: Once | INTRAMUSCULAR | Status: AC
  Administered 2018-04-26: 4 mg via INTRAVENOUS

## 2018-04-26 MED ORDER — ONDANSETRON HCL 4 MG/2ML IJ SOLN: 4.0000 mg | Freq: Four times a day (QID) | INTRAMUSCULAR | Status: DC | PRN

## 2018-04-26 MED ORDER — MIDAZOLAM HCL 2 MG/ML PO SYRP: 8.0000 mg | ORAL_SOLUTION | Freq: Once | ORAL | Status: DC | PRN

## 2018-04-26 MED ORDER — LIDOCAINE-EPINEPHRINE (PF) 1 %-1:200000 IJ SOLN
INTRAMUSCULAR | Status: AC
  Filled 2018-04-26: qty 30

## 2018-04-26 MED ORDER — ATORVASTATIN CALCIUM 10 MG PO TABS: 10.0000 mg | ORAL_TABLET | Freq: Every day | ORAL | 11 refills | Status: DC

## 2018-04-26 MED ORDER — MIDAZOLAM HCL 2 MG/2ML IJ SOLN
INTRAMUSCULAR | Status: DC | PRN
  Administered 2018-04-26 (×2): 1 mg via INTRAVENOUS
  Administered 2018-04-26: 2 mg via INTRAVENOUS
  Administered 2018-04-26: 1 mg via INTRAVENOUS

## 2018-04-26 MED ORDER — SODIUM CHLORIDE 0.9% FLUSH: 3.0000 mL | INTRAVENOUS | Status: DC | PRN

## 2018-04-26 MED ORDER — METOCLOPRAMIDE HCL 10 MG PO TABS
ORAL_TABLET | ORAL | Status: AC
  Filled 2018-04-26: qty 1

## 2018-04-26 MED ORDER — SODIUM CHLORIDE 0.9 % IV SOLN: INTRAVENOUS | Status: DC

## 2018-04-26 MED ORDER — SODIUM CHLORIDE 0.9 % IV SOLN: 250.0000 mL | INTRAVENOUS | Status: DC | PRN

## 2018-04-26 MED ORDER — FENTANYL CITRATE (PF) 100 MCG/2ML IJ SOLN
INTRAMUSCULAR | Status: AC
  Filled 2018-04-26: qty 2

## 2018-04-26 MED ORDER — HYDRALAZINE HCL 20 MG/ML IJ SOLN: 5.0000 mg | INTRAMUSCULAR | Status: DC | PRN

## 2018-04-26 MED ORDER — FAMOTIDINE 20 MG PO TABS: 40.0000 mg | ORAL_TABLET | Freq: Once | ORAL | Status: DC | PRN

## 2018-04-26 MED ORDER — METOCLOPRAMIDE HCL 5 MG/ML IJ SOLN
5.0000 mg | Freq: Once | INTRAMUSCULAR | Status: AC | PRN
  Administered 2018-04-26: 5 mg via INTRAVENOUS
  Filled 2018-04-26 (×2): qty 1

## 2018-04-26 MED ORDER — FENTANYL CITRATE (PF) 100 MCG/2ML IJ SOLN
INTRAMUSCULAR | Status: DC | PRN
  Administered 2018-04-26: 25 ug via INTRAVENOUS
  Administered 2018-04-26: 50 ug via INTRAVENOUS
  Administered 2018-04-26: 25 ug via INTRAVENOUS

## 2018-04-26 MED ORDER — MIDAZOLAM HCL 5 MG/5ML IJ SOLN
INTRAMUSCULAR | Status: AC
  Filled 2018-04-26: qty 5

## 2018-04-26 MED ORDER — CEFAZOLIN SODIUM-DEXTROSE 2-4 GM/100ML-% IV SOLN
INTRAVENOUS | Status: AC
  Filled 2018-04-26: qty 100

## 2018-04-26 MED ORDER — HYDROMORPHONE HCL 1 MG/ML IJ SOLN: 1.0000 mg | Freq: Once | INTRAMUSCULAR | Status: DC | PRN

## 2018-04-26 MED ORDER — LABETALOL HCL 5 MG/ML IV SOLN: 10.0000 mg | INTRAVENOUS | Status: DC | PRN

## 2018-04-26 MED ORDER — HEPARIN (PORCINE) IN NACL 1000-0.9 UT/500ML-% IV SOLN
INTRAVENOUS | Status: AC
  Filled 2018-04-26: qty 500

## 2018-04-26 MED ORDER — HEPARIN SODIUM (PORCINE) 1000 UNIT/ML IJ SOLN
INTRAMUSCULAR | Status: DC | PRN
  Administered 2018-04-26: 5000 [IU] via INTRAVENOUS

## 2018-04-26 MED ORDER — ATORVASTATIN CALCIUM 10 MG PO TABS
10.0000 mg | ORAL_TABLET | Freq: Every day | ORAL | Status: DC
  Filled 2018-04-26: qty 1

## 2018-04-26 MED ORDER — ASPIRIN EC 81 MG PO TBEC: 81.0000 mg | DELAYED_RELEASE_TABLET | Freq: Every day | ORAL | 2 refills | Status: DC

## 2018-04-26 MED ORDER — HEPARIN (PORCINE) IN NACL 1000-0.9 UT/500ML-% IV SOLN
INTRAVENOUS | Status: AC
  Filled 2018-04-26: qty 1000

## 2018-04-26 MED ORDER — ASPIRIN EC 81 MG PO TBEC: 81.0000 mg | DELAYED_RELEASE_TABLET | Freq: Every day | ORAL | Status: DC

## 2018-04-26 MED ORDER — METHYLPREDNISOLONE SODIUM SUCC 125 MG IJ SOLR: 125.0000 mg | Freq: Once | INTRAMUSCULAR | Status: DC | PRN

## 2018-04-26 MED ORDER — ONDANSETRON HCL 4 MG/2ML IJ SOLN
INTRAMUSCULAR | Status: AC
  Administered 2018-04-26: 4 mg via INTRAVENOUS
  Filled 2018-04-26: qty 2

## 2018-04-26 MED ORDER — IOPAMIDOL (ISOVUE-300) INJECTION 61%
INTRAVENOUS | Status: DC | PRN
  Administered 2018-04-26: 135 mL via INTRA_ARTERIAL

## 2018-04-26 MED ORDER — HEPARIN SODIUM (PORCINE) 1000 UNIT/ML IJ SOLN
INTRAMUSCULAR | Status: AC
  Filled 2018-04-26: qty 1

## 2018-04-26 MED ORDER — DIPHENHYDRAMINE HCL 50 MG/ML IJ SOLN: 50.0000 mg | Freq: Once | INTRAMUSCULAR | Status: DC | PRN

## 2018-04-26 MED ORDER — SODIUM CHLORIDE 0.9% FLUSH: 3.0000 mL | Freq: Two times a day (BID) | INTRAVENOUS | Status: DC

## 2018-04-26 MED ORDER — ACETAMINOPHEN 325 MG PO TABS: 650.0000 mg | ORAL_TABLET | ORAL | Status: DC | PRN

## 2018-04-26 SURGICAL SUPPLY — 38 items
BALLN DORADO 5X200X135 (BALLOONS) ×2
BALLN LUTONIX  018 4X60X130 (BALLOONS) ×1
BALLN LUTONIX 018 4X60X130 (BALLOONS) ×1
BALLN LUTONIX 018 5X220X130 (BALLOONS) ×2
BALLN LUTONIX 5X120X130 (BALLOONS) ×2
BALLN LUTONIX 5X220X130 (BALLOONS) ×2
BALLN ULTRVRSE 2.5X40X150 (BALLOONS) ×2
BALLN ULTRVRSE 3X300X130 (BALLOONS) ×2
BALLN ULTRVRSE 3X300X150 (BALLOONS) ×1
BALLN ULTRVRSE 3X300X150 OTW (BALLOONS) ×1
BALLN ULTRVRSE 4X300X130 (BALLOONS) ×2
BALLOON DORADO 5X200X135 (BALLOONS) ×1 IMPLANT
BALLOON LUTONIX 018 4X60X130 (BALLOONS) ×1 IMPLANT
BALLOON LUTONIX 018 5X220X130 (BALLOONS) ×1 IMPLANT
BALLOON LUTONIX 5X120X130 (BALLOONS) ×1 IMPLANT
BALLOON LUTONIX 5X220X130 (BALLOONS) ×1 IMPLANT
BALLOON ULTRVRSE 150X40X2.5 (BALLOONS) ×1 IMPLANT
BALLOON ULTRVRSE 3X300X130 (BALLOONS) ×1 IMPLANT
BALLOON ULTRVRSE 3X300X150 OTW (BALLOONS) ×1 IMPLANT
BALLOON ULTRVRSE 4X300X130 (BALLOONS) ×1 IMPLANT
CATH BEACON 5 .038 100 VERT TP (CATHETERS) ×2 IMPLANT
CATH CXI SUPP ANG 4FR 135 (CATHETERS) ×1 IMPLANT
CATH CXI SUPP ANG 4FR 135CM (CATHETERS) ×2
CATH PIG 70CM (CATHETERS) ×2 IMPLANT
CATH SEEKER .018X150 (CATHETERS) ×2 IMPLANT
CATH SEEKER .035X135CM (CATHETERS) ×2 IMPLANT
DEVICE PRESTO INFLATION (MISCELLANEOUS) ×2 IMPLANT
DEVICE STARCLOSE SE CLOSURE (Vascular Products) ×2 IMPLANT
GLIDEWIRE ADV .035X260CM (WIRE) ×2 IMPLANT
PACK ANGIOGRAPHY (CUSTOM PROCEDURE TRAY) ×2 IMPLANT
SHEATH ANL2 6FRX45 HC (SHEATH) ×2 IMPLANT
SHEATH BRITE TIP 5FRX11 (SHEATH) ×2 IMPLANT
STENT VIABAHN 6X250X120 (Permanent Stent) ×2 IMPLANT
STENT VIABAHN 6X7.5X120 (Permanent Stent) ×2 IMPLANT
SYR MEDRAD MARK V 150ML (SYRINGE) ×2 IMPLANT
TUBING CONTRAST HIGH PRESS 72 (TUBING) ×2 IMPLANT
WIRE G V18X300CM (WIRE) ×8 IMPLANT
WIRE J 3MM .035X145CM (WIRE) ×2 IMPLANT

## 2018-04-26 NOTE — OR Nursing (Signed)
Sleep apnea noted discussed with pt, encouraged to discuss with primary physician. Pt reports inability to sleep due to constant feeling of dread and inability to fall asleep for several past days

## 2018-04-26 NOTE — H&P (Signed)
[facility] History & Physical Update  The patient was interviewed and re-examined.  The patient's previous History and Physical has been reviewed and is unchanged.  There is no change in the plan of care. We plan to proceed with the scheduled procedure.  Leotis Pain, MD  04/26/2018, 9:10 AM

## 2018-04-26 NOTE — Op Note (Signed)
[facility]  Percutaneous Study/Intervention Procedural Note   Date of Surgery: 04/26/2018  Surgeon(s):DEW,JASON    Assistants:none  Pre-operative Diagnosis: PAD with ulceration left foot  Post-operative diagnosis:  Same  Procedure(s) Performed:             1.  Ultrasound guidance for vascular access right femoral artery             2.  Catheter placement into left common femoral artery from right femoral approach             3.  Aortogram and selective left lower extremity angiogram             4.  Percutaneous transluminal angioplasty of the tibioperoneal trunk and proximal peroneal artery with 2.5 mm diameter conventional angioplasty balloon and a 4 mm diameter Lutonix drug-coated angioplasty balloon             5.   Percutaneous transluminal angioplasty of the entire left SFA and popliteal arteries with 5 mm diameter Lutonix drug-coated angioplasty balloons  6.  Viabahn stent placement x2 to the left SFA and proximal popliteal artery for multiple areas of residual stenosis after angioplasty using a 6 mm diameter by 25 cm length and a 6 mm diameter by 7.5 cm length stent             7.  StarClose closure device right femoral artery  EBL: 5 cc  Contrast: 135 cc  Fluoro Time: 20.1 minutes  Moderate Conscious Sedation Time: approximately 65 minutes using 5 mg of Versed and 100 Mcg of Fentanyl              Indications:  Patient is a 78 y.o.male with nonhealing ulceration on the left foot and a long history of multiple previous surgeries and interventions for severe peripheral arterial disease having not been checked in a couple of years. The patient is brought in for angiography for further evaluation and potential treatment.  The angiogram would be diagnostic and potentially therapeutic if significant disease was found.  Due to the limb threatening nature of the situation, angiogram was performed for attempted limb salvage. The patient is aware that if the  procedure fails, amputation would be expected.  The patient also understands that even with successful revascularization, amputation may still be required due to the severity of the situation. Risks and benefits are discussed and informed consent is obtained.   Procedure:  The patient was identified and appropriate procedural time out was performed.  The patient was then placed supine on the table and prepped and draped in the usual sterile fashion. Moderate conscious sedation was administered during a face to face encounter with the patient throughout the procedure with my supervision of the RN administering medicines and monitoring the patient's vital signs, pulse oximetry, telemetry and mental status throughout from the start of the procedure until the patient was taken to the recovery room. Ultrasound was used to evaluate the right common femoral artery.  It was patent .  A digital ultrasound image was acquired.  A Seldinger needle was used to access the right common femoral artery under direct ultrasound guidance and a permanent image was performed.  A 0.035 J wire was advanced without resistance and a 5Fr sheath was placed.  Pigtail catheter was placed into the aorta and an AP aortogram was performed. 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 I then crossed the aortic bifurcation and advanced to the left femoral head. Selective left lower extremity angiogram was then performed. This demonstrated that the common femoral artery and profunda femoris artery were patent.  The SFA had a long segment occlusion.  There was faint reconstitution of the above-knee popliteal artery but this then had an occlusion in the below-knee popliteal artery and tibioperoneal trunk.  The proximal peroneal arteries and posterior tibial arteries also had  occlusions with reconstitution below the proximal occlusion.  The anterior tibial artery was chronically occluded with no reconstitution seen. It was felt that it was in the patient's best interest to proceed with intervention after these images to avoid a second procedure and a larger amount of contrast and fluoroscopy based off of the findings from the initial angiogram. The patient was systemically heparinized and a 6 [place] Ansell sheath was then placed over the [employer] wire.  This was done so with significant resistance to the marked tortuosity.  I then used a Kumpe catheter and the advantage wire to navigate through the SFA and popliteal occlusions and down to the tibioperoneal trunk.  The calcific lesion in the tibioperoneal trunk would not allow the catheters to initially cross even after exchanging for a CXI and a seeker catheter.  We exchanged for a 0.018 wire and a 0.018 seeker catheter was able to cross into the peroneal artery and intraluminal flow was confirmed.  I then replaced a 0.018 wire and proceeded with treatment.  Initially, a 3 mm diameter by 30 cm length angioplasty balloon was selected to treat the peroneal artery and tibioperoneal trunk but it would not cross through the popliteal lesion.  I use this balloon to predilate the popliteal and SFA lesions and then definitively treated the SFA and popliteal lesions with 5 mm diameter by 22 cm length Lutonix drug-coated angioplasty balloons inflated to 12 atm for 1 minute.  Following this, I was able to get a 2.5 mm diameter angioplasty balloon down into the tibioperoneal trunk and proximal peroneal artery and inflated this to 16 atm for 1 minute.  This was a little undersized, and I was able to advance a 4 mm diameter by 6 cm length Lutonix drug-coated angioplasty balloon into the proximal peroneal artery and tibioperoneal trunk and inflated this to 8 atm for 1 minute.  Completion imaging showed only about a 30% residual stenosis in the  tibioperoneal trunk and proximal peroneal artery, but multiple areas of greater than 80% residual stenosis throughout the SFA and proximal popliteal arteries.  I started with a 6 mm diameter by 25 cm length Viabahn stent from just below the previously placed stent into the proximal popliteal artery up to the proximal SFA.  A 6 mm diameter by 7.5 cm length was then extended proximally to just below the origin of the SFA.  These were postdilated with 5 mm diameter high-pressure angioplasty balloons and completion imaging showed less than 20% residual stenosis in the SFA and popliteal arteries. I elected to terminate the procedure. The sheath was removed and StarClose closure device was deployed in the right femoral artery with excellent hemostatic result. The patient was taken to the recovery room in stable condition having tolerated the procedure well.  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  The SFA had a long segment occlusion.  There was faint reconstitution of the above-knee popliteal artery but this then had an occlusion in the below-knee popliteal artery and tibioperoneal trunk.  The proximal peroneal arteries and posterior tibial arteries also had occlusions with reconstitution below the proximal occlusion.  The anterior tibial artery was chronically occluded with no reconstitution seen   Disposition: Patient was taken to the recovery room in stable condition having tolerated the procedure well.  Complications: None  Leotis Pain 04/26/2018 12:08 PM   This note was created with Dragon Medical transcription system. Any errors in dictation are purely unintentional.

## 2018-04-26 NOTE — OR Nursing (Signed)
nausea remains but improved after getting two doses of zofran IV.

## 2018-04-26 NOTE — Progress Notes (Signed)
Patient verbalized to staff that he lives alone and his ride home was [employer].  He says he has no other options for care at home and also with his transportation.   Dr. Lucky Cowboy notified of the above and he is ok with patient proceeding with his procedure.

## 2018-04-26 NOTE — OR Nursing (Signed)
Reglan IV given for continued nausea.

## 2018-04-26 NOTE — Discharge Instructions (Signed)
Groin Insertion Instructions-If you lose feeling or develop tingling or pain in your leg or foot after the procedure, please walk around first.  If the discomfort does not improve , contact your physician and proceed to the nearest emergency room.  Loss of feeling in your leg might mean that a blockage has formed in the artery and this can be appropriately treated.  Limit your activity for the next two days after your procedure.  Avoid stooping, bending, heavy lifting or exertion as this may put pressure on the insertion site.  Resume normal activities in 48 hours.  You may shower after 24 hours but avoid excessive warm water and do not scrub the site.  Remove clear dressing in 48 hours.  If you have had a closure device inserted, do not soak in a tub bath or a hot tub for at least one week. ° °No driving for 48 hours after discharge.  After the procedure, check the insertion site occasionally.  If any oozing occurs or there is apparent swelling, firm pressure over the site will prevent a bruise from forming.  You can not hurt anything by pressing directly on the site.  The pressure stops the bleeding by allowing a small clot to form.  If the bleeding continues after the pressure has been applied for more than 15 minutes, call 911 or go to the nearest emergency room.   ° °The x-ray dye causes you to pass a considerate amount of urine.  For this reason, you will be asked to drink plenty of liquids after the procedure to prevent dehydration.  You may resume you regular diet.  Avoid caffeine products.   ° °For pain at the site of your procedure, take non-aspirin medicines such as Tylenol. ° °Medications: A. Hold Metformin for 48 hours if applicable.  B. Continue taking all your present medications at home unless your doctor prescribes any changes ° ° °Angiogram, Care After °This sheet gives you information about how to care for yourself after your procedure. Your health care provider may also give you more specific  instructions. If you have problems or questions, contact your health care provider. °What can I expect after the procedure? °After the procedure, it is common to have bruising and tenderness at the catheter insertion area. °Follow these instructions at home: °Insertion site care °· Follow instructions from your health care provider about how to take care of your insertion site. Make sure you: °? Wash your hands with soap and water before you change your bandage (dressing). If soap and water are not available, use hand sanitizer. °? Change your dressing as told by your health care provider. °? Leave stitches (sutures), skin glue, or adhesive strips in place. These skin closures may need to stay in place for 2 weeks or longer. If adhesive strip edges start to loosen and curl up, you may trim the loose edges. Do not remove adhesive strips completely unless your health care provider tells you to do that. °· Do not take baths, swim, or use a hot tub until your health care provider approves. °· You may shower 24-48 hours after the procedure or as told by your health care provider. °? Gently wash the site with plain soap and water. °? Pat the area dry with a clean towel. °? Do not rub the site. This may cause bleeding. °· Do not apply powder or lotion to the site. Keep the site clean and dry. °· Check your insertion site every day for signs of   infection. Check for: °? Redness, swelling, or pain. °? Fluid or blood. °? Warmth. °? Pus or a bad smell. °Activity °· Rest as told by your health care provider, usually for 1-2 days. °· Do not lift anything that is heavier than 10 lbs. (4.5 kg) or as told by your health care provider. °· Do not drive for 24 hours if you were given a medicine to help you relax (sedative). °· Do not drive or use heavy machinery while taking prescription pain medicine. °General instructions ° °· Return to your normal activities as told by your health care provider, usually in about a week. Ask your  health care provider what activities are safe for you. °· If the catheter site starts bleeding, lie flat and put pressure on the site. If the bleeding does not stop, get help right away. This is a medical emergency. °· Drink enough fluid to keep your urine clear or pale yellow. This helps flush the contrast dye from your body. °· Take over-the-counter and prescription medicines only as told by your health care provider. °· Keep all follow-up visits as told by your health care provider. This is important. °Contact a health care provider if: °· You have a fever or chills. °· You have redness, swelling, or pain around your insertion site. °· You have fluid or blood coming from your insertion site. °· The insertion site feels warm to the touch. °· You have pus or a bad smell coming from your insertion site. °· You have bruising around the insertion site. °· You notice blood collecting in the tissue around the catheter site (hematoma). The hematoma may be painful to the touch. °Get help right away if: °· You have severe pain at the catheter insertion area. °· The catheter insertion area swells very fast. °· The catheter insertion area is bleeding, and the bleeding does not stop when you hold steady pressure on the area. °· The area near or just beyond the catheter insertion site becomes pale, cool, tingly, or numb. °These symptoms may represent a serious problem that is an emergency. Do not wait to see if the symptoms will go away. Get medical help right away. Call your local emergency services (911 in the [place] Do not drive yourself to the hospital. °Summary °· After the procedure, it is common to have bruising and tenderness at the catheter insertion area. °· After the procedure, it is important to rest and drink plenty of fluids. °· Do not take baths, swim, or use a hot tub until your health care provider says it is okay to do so. You may shower 24-48 hours after the procedure or as told by your health care  provider. °· If the catheter site starts bleeding, lie flat and put pressure on the site. If the bleeding does not stop, get help right away. This is a medical emergency. °This information is not intended to replace advice given to you by your health care provider. Make sure you discuss any questions you have with your health care provider. °Document Released: 09/26/2004 Document Revised: 02/13/2016 Document Reviewed: 02/13/2016 °Elsevier Interactive Patient Education © 2019 [employer] ° °

## 2018-04-26 NOTE — OR Nursing (Signed)
Continued reporting of "not right" stomach. Slowly eating crackers and sipping ginger ale.will extend stay until feeling better. Informed pt about administaration of two doses of Zofran and dose of IV reglan.

## 2018-04-27 ENCOUNTER — Encounter: Payer: Self-pay | Admitting: Vascular Surgery

## 2018-05-01 ENCOUNTER — Other Ambulatory Visit: Payer: Self-pay | Admitting: Family Medicine

## 2018-05-01 DIAGNOSIS — F419 Anxiety disorder, unspecified: Secondary | ICD-10-CM

## 2018-05-01 DIAGNOSIS — I10 Essential (primary) hypertension: Secondary | ICD-10-CM

## 2018-05-03 ENCOUNTER — Other Ambulatory Visit: Payer: Self-pay | Admitting: Family Medicine

## 2018-05-03 DIAGNOSIS — M159 Polyosteoarthritis, unspecified: Secondary | ICD-10-CM

## 2018-05-03 DIAGNOSIS — G629 Polyneuropathy, unspecified: Secondary | ICD-10-CM

## 2018-05-03 DIAGNOSIS — M150 Primary generalized (osteo)arthritis: Secondary | ICD-10-CM

## 2018-05-24 ENCOUNTER — Other Ambulatory Visit (INDEPENDENT_AMBULATORY_CARE_PROVIDER_SITE_OTHER): Payer: Self-pay | Admitting: Vascular Surgery

## 2018-05-24 DIAGNOSIS — L97521 Non-pressure chronic ulcer of other part of left foot limited to breakdown of skin: Secondary | ICD-10-CM | POA: Diagnosis not present

## 2018-05-24 DIAGNOSIS — I739 Peripheral vascular disease, unspecified: Secondary | ICD-10-CM | POA: Diagnosis not present

## 2018-05-24 DIAGNOSIS — I70249 Atherosclerosis of native arteries of left leg with ulceration of unspecified site: Secondary | ICD-10-CM

## 2018-05-24 DIAGNOSIS — Z95820 Peripheral vascular angioplasty status with implants and grafts: Secondary | ICD-10-CM

## 2018-05-25 ENCOUNTER — Ambulatory Visit (INDEPENDENT_AMBULATORY_CARE_PROVIDER_SITE_OTHER): Payer: Medicare Other | Admitting: Vascular Surgery

## 2018-05-25 ENCOUNTER — Other Ambulatory Visit: Payer: Self-pay

## 2018-05-25 ENCOUNTER — Ambulatory Visit (INDEPENDENT_AMBULATORY_CARE_PROVIDER_SITE_OTHER): Payer: Medicare Other

## 2018-05-25 ENCOUNTER — Encounter (INDEPENDENT_AMBULATORY_CARE_PROVIDER_SITE_OTHER): Payer: Self-pay | Admitting: Vascular Surgery

## 2018-05-25 VITALS — BP 130/73 | HR 63 | Resp 18 | Ht 71.0 in | Wt 168.0 lb

## 2018-05-25 DIAGNOSIS — M7989 Other specified soft tissue disorders: Secondary | ICD-10-CM

## 2018-05-25 DIAGNOSIS — E7800 Pure hypercholesterolemia, unspecified: Secondary | ICD-10-CM

## 2018-05-25 DIAGNOSIS — I70245 Atherosclerosis of native arteries of left leg with ulceration of other part of foot: Secondary | ICD-10-CM | POA: Diagnosis not present

## 2018-05-25 DIAGNOSIS — I70235 Atherosclerosis of native arteries of right leg with ulceration of other part of foot: Secondary | ICD-10-CM | POA: Diagnosis not present

## 2018-05-25 DIAGNOSIS — E1142 Type 2 diabetes mellitus with diabetic polyneuropathy: Secondary | ICD-10-CM

## 2018-05-25 DIAGNOSIS — L97529 Non-pressure chronic ulcer of other part of left foot with unspecified severity: Secondary | ICD-10-CM | POA: Diagnosis not present

## 2018-05-25 DIAGNOSIS — I10 Essential (primary) hypertension: Secondary | ICD-10-CM | POA: Diagnosis not present

## 2018-05-25 DIAGNOSIS — Z95820 Peripheral vascular angioplasty status with implants and grafts: Secondary | ICD-10-CM

## 2018-05-25 DIAGNOSIS — Z79899 Other long term (current) drug therapy: Secondary | ICD-10-CM

## 2018-05-25 DIAGNOSIS — Z87891 Personal history of nicotine dependence: Secondary | ICD-10-CM

## 2018-05-25 DIAGNOSIS — L97519 Non-pressure chronic ulcer of other part of right foot with unspecified severity: Secondary | ICD-10-CM

## 2018-05-25 DIAGNOSIS — I7025 Atherosclerosis of native arteries of other extremities with ulceration: Secondary | ICD-10-CM

## 2018-05-25 DIAGNOSIS — R600 Localized edema: Secondary | ICD-10-CM | POA: Diagnosis not present

## 2018-05-25 DIAGNOSIS — I70249 Atherosclerosis of native arteries of left leg with ulceration of unspecified site: Secondary | ICD-10-CM

## 2018-05-25 NOTE — Progress Notes (Signed)
MRN : [id number]  Randy Howard is a 78 y.o. (September 04, 1940) male who presents with chief complaint of  Chief Complaint  Patient presents with  . Follow-up    [facility] 4week abi  .  History of Present Illness: Patient returns today in follow up of his PAD.  He underwent extensive left lower extremity revascularization a few weeks ago.  He says his wound on his foot is starting to heal and the podiatrist is pleased with this.  He still has swelling in both legs.  His ABIs today are stable on the right at 0.6 and markedly improved and now normal on the left at 1.0.  Current Outpatient Medications  Medication Sig Dispense Refill  . aspirin EC 81 MG tablet Take 1 tablet (81 mg total) by mouth daily. 150 tablet 2  . atorvastatin (LIPITOR) 10 MG tablet Take 1 tablet (10 mg total) by mouth daily. 30 tablet 11  . baclofen (LIORESAL) 10 MG tablet TAKE 1 TABLET BY MOUTH THREE TIMES DAILYAS NEEDED 90 each 3  . benazepril (LOTENSIN) 40 MG tablet TAKE 1 TABLET BY MOUTH ONCE DAILY 90 tablet 3  . busPIRone (BUSPAR) 5 MG tablet TAKE 1 TABLET BY MOUTH ONCE DAILY AS NEEDED. 90 tablet 3  . clopidogrel (PLAVIX) 75 MG tablet TAKE 1 TABLET BY MOUTH ONCE DAILY 90 tablet 3  . finasteride (PROSCAR) 5 MG tablet Take 1 tablet (5 mg total) by mouth daily. 90 tablet 3  . gabapentin (NEURONTIN) 100 MG capsule Take 2 capsules in AM and 1-2 capsules at bedtime 360 capsule 3  . glimepiride (AMARYL) 4 MG tablet TAKE 1 TABLET BY MOUTH EVERY MORNING 90 tablet 1  . hydrOXYzine (ATARAX/VISTARIL) 10 MG tablet Take 1 tablet (10 mg total) by mouth 3 (three) times daily as needed. 90 tablet 3  . Magnesium Oxide, Antacid, 500 MG CAPS Take by mouth.    . meclizine (ANTIVERT) 25 MG tablet Take 1 tablet (25 mg total) by mouth daily as needed for dizziness. 90 tablet 3  . metoprolol tartrate (LOPRESSOR) 50 MG tablet TAKE 1 TABLET BY MOUTH TWICE DAILY 180 tablet 1  . mirtazapine (REMERON) 15 MG tablet     . nitroGLYCERIN (NITROSTAT) 0.4  MG SL tablet Place 1 tablet (0.4 mg total) under the tongue every 5 (five) minutes as needed for chest pain. 50 tablet 3  . Potassium 99 MG TABS Take by mouth.    . tamsulosin (FLOMAX) 0.4 MG CAPS capsule TAKE 1 CAPSULE BY MOUTH ONCE DAILY 90 capsule 1  . torsemide (DEMADEX) 20 MG tablet TAKE 20 MG BY MOUTH TWICE DAILY FOR 3 DAYS, THEN 20 MG DAILY THEREAFTER    . traMADol (ULTRAM) 50 MG tablet Take 1 tablet (50 mg total) by mouth every 12 (twelve) hours as needed. 30 tablet 2  . triamcinolone ointment (KENALOG) 0.1 % Apply topically.    . lovastatin (MEVACOR) 40 MG tablet Take by mouth.     No current facility-administered medications for this visit.     Past Medical History:  Diagnosis Date  . Arthritis   . Coronary artery disease   . Elevated lipids   . Hyperlipidemia   . Hypertension   . Neuropathy   . Nocturia   . Obstructive sleep apnea   . Peripheral vascular disease ([place])   . Prostate enlargement   . PVD (peripheral vascular disease) ([place])   . Right carotid bruit   . Skin cancer    Followed by Dr. Evorn Gong  .  Skin cancer   . Tremor     Past Surgical History:  Procedure Laterality Date  . CARDIAC CATHETERIZATION    . CAROTID ARTERY ANGIOPLASTY Left 09/30/2011  . CORONARY ARTERY BYPASS GRAFT  2006  . ENDARTERECTOMY FEMORAL Bilateral 11/01/2014   Procedure: ENDARTERECTOMY FEMORAL;  Surgeon: Algernon Huxley, MD;  Location: [facility];  Service: Vascular;  Laterality: Bilateral;  . LOWER EXTREMITY ANGIOGRAPHY Left 04/26/2018   Procedure: LOWER EXTREMITY ANGIOGRAPHY;  Surgeon: Algernon Huxley, MD;  Location: [place] [facility];  Service: Cardiovascular;  Laterality: Left;  . PERIPHERAL VASCULAR CATHETERIZATION Left 10/09/2014   Procedure: Lower Extremity Angiography;  Surgeon: Algernon Huxley, MD;  Location: [place] [facility];  Service: Cardiovascular;  Laterality: Left;  . PERIPHERAL VASCULAR CATHETERIZATION  10/09/2014   Procedure: Lower Extremity Intervention;  Surgeon: Algernon Huxley,  MD;  Location: [place] [facility];  Service: Cardiovascular;;   Social History        Tobacco Use  . Smoking status: Former Smoker    Packs/day: 1.00    Years: 25.00    Pack years: 25.00    Types: Cigarettes    Last attempt to quit: 03/24/1985    Years since quitting: 33.0  . Smokeless tobacco: Never Used  Substance Use Topics  . Alcohol use: No    Alcohol/week: 0.0 standard drinks  . Drug use: No    Family History      Family History  Problem Relation Age of Onset  . Heart disease Sister   . Heart disease Sister   . Diabetes Sister   . Diabetes Sister   . Diabetes Brother         Allergies  Allergen Reactions  . Oxycodone Nausea Only     REVIEW OF SYSTEMS (Negative unless checked)  Constitutional: [] ?Weight loss  [] ?Fever  [] ?Chills Cardiac: [] ?Chest pain   [] ?Chest pressure   [] ?Palpitations   [] ?Shortness of breath when laying flat   [] ?Shortness of breath at rest   [] ?Shortness of breath with exertion. Vascular:  [x] ?Pain in legs with walking   [x] ?Pain in legs at rest   [] ?Pain in legs when laying flat   [x] ?Claudication   [] ?Pain in feet when walking  [] ?Pain in feet at rest  [] ?Pain in feet when laying flat   [] ?History of DVT   [] ?Phlebitis   [] ?Swelling in legs   [] ?Varicose veins   [x] ?Non-healing ulcers Pulmonary:   [] ?Uses home oxygen   [] ?Productive cough   [] ?Hemoptysis   [] ?Wheeze  [] ?COPD   [] ?Asthma Neurologic:  [] ?Dizziness  [] ?Blackouts   [] ?Seizures   [] ?History of stroke   [] ?History of TIA  [] ?Aphasia   [] ?Temporary blindness   [] ?Dysphagia   [] ?Weakness or numbness in arms   [x] ?Weakness or numbness in legs Musculoskeletal:  [x] ?Arthritis   [] ?Joint swelling   [] ?Joint pain   [] ?Low back pain Hematologic:  [] ?Easy bruising  [] ?Easy bleeding   [] ?Hypercoagulable state   [] ?Anemic   Gastrointestinal:  [] ?Blood in stool   [] ?Vomiting blood  [] ?Gastroesophageal reflux/heartburn   [] ?Abdominal pain Genitourinary:   [] ?Chronic kidney disease   [] ?Difficult urination  [] ?Frequent urination  [] ?Burning with urination   [] ?Hematuria Skin:  [] ?Rashes   [x] ?Ulcers   [x] ?Wounds Psychological:  [] ?History of anxiety   [] ? History of major depression.     Physical Examination  BP 130/73 (BP Location: Right Arm)   Pulse 63   Resp 18   Ht 5\' 11"  (1.803 m)   Wt 168 lb (76.2  kg)   BMI 23.43 kg/m  Gen:  WD/WN, NAD Head: [place]/AT, No temporalis wasting. Ear/Nose/Throat: Hearing grossly intact, nares w/o erythema or drainage Eyes: Conjunctiva clear. Sclera non-icteric Neck: Supple.  Trachea midline Pulmonary:  Good air movement, no use of accessory muscles.  Cardiac: Irregular Vascular:  Vessel Right Left  Radial Palpable Palpable                          PT  trace palpable  1+ palpable  DP  1+ palpable  1+ palpable    Musculoskeletal: M/S 5/5 throughout.  No deformity or atrophy.  1-2+ bilateral lower extremity edema. Neurologic: Sensation diminished in extremities.  Symmetrical.  Speech is fluent.  Psychiatric: Judgment intact, Mood & affect appropriate for pt's clinical situation. Dermatologic: No rashes or ulcers noted.  No cellulitis or open wounds.       Labs Recent Results (from the past 2160 hour(s))  Glucose, capillary     Status: Abnormal   Collection Time: 04/26/18  8:47 AM  Result Value Ref Range   Glucose-Capillary 172 (H) 70 - 99 mg/dL  BUN     Status: None   Collection Time: 04/26/18  9:29 AM  Result Value Ref Range   BUN 21 8 - 23 mg/dL    Comment: Performed at [facility], [address] [place], [place] [address]  Creatinine, serum     Status: Abnormal   Collection Time: 04/26/18  9:29 AM  Result Value Ref Range   Creatinine, Ser 1.50 (H) 0.61 - 1.24 mg/dL   GFR calc non Af Amer 44 (L) >60 mL/min   GFR calc Af Amer 51 (L) >60 mL/min    Comment: Performed at [facility], [address] [place], [place] [address]  Glucose, capillary      Status: Abnormal   Collection Time: 04/26/18 12:27 PM  Result Value Ref Range   Glucose-Capillary 166 (H) 70 - 99 mg/dL    Radiology No results found.  Assessment/Plan Swelling of limb Recommend compression and elevation.  Is already on diuretic therapy.  Elevated cholesterol lipid control important in reducing the progression of atherosclerotic disease. Continue statin therapy   Type 2 diabetes mellitus with diabetic polyneuropathy, without long-term current use of insulin (HCC) blood glucose control important in reducing the progression of atherosclerotic disease. Also, involved in wound healing. On appropriate medications.   Hypertension blood pressure control important in reducing the progression of atherosclerotic disease. On appropriate oral medications.  Atherosclerosis of native arteries of the extremities with ulceration ([place]) His ABIs today are stable on the right at 0.6 and markedly improved and now normal on the left at 1.0. This is a marked improvement after revascularization and his wound is doing better.  We will try to delay any intervention on the right leg until his left foot has healed.  I will plan to see him on a short interval follow-up in 2 to 3 months.    Leotis Pain, MD  05/25/2018 3:00 PM    This note was created with Dragon medical transcription system.  Any errors from dictation are purely unintentional

## 2018-05-25 NOTE — Assessment & Plan Note (Signed)
His ABIs today are stable on the right at 0.6 and markedly improved and now normal on the left at 1.0. This is a marked improvement after revascularization and his wound is doing better.  We will try to delay any intervention on the right leg until his left foot has healed.  I will plan to see him on a short interval follow-up in 2 to 3 months.

## 2018-06-09 ENCOUNTER — Other Ambulatory Visit: Payer: Self-pay | Admitting: Family Medicine

## 2018-06-09 DIAGNOSIS — E1142 Type 2 diabetes mellitus with diabetic polyneuropathy: Secondary | ICD-10-CM

## 2018-06-09 DIAGNOSIS — I2510 Atherosclerotic heart disease of native coronary artery without angina pectoris: Secondary | ICD-10-CM

## 2018-06-24 DIAGNOSIS — L97521 Non-pressure chronic ulcer of other part of left foot limited to breakdown of skin: Secondary | ICD-10-CM | POA: Diagnosis not present

## 2018-06-24 DIAGNOSIS — E1140 Type 2 diabetes mellitus with diabetic neuropathy, unspecified: Secondary | ICD-10-CM | POA: Diagnosis not present

## 2018-06-24 DIAGNOSIS — I739 Peripheral vascular disease, unspecified: Secondary | ICD-10-CM | POA: Diagnosis not present

## 2018-06-25 ENCOUNTER — Other Ambulatory Visit: Payer: Medicare Other

## 2018-06-28 DIAGNOSIS — I509 Heart failure, unspecified: Secondary | ICD-10-CM | POA: Diagnosis not present

## 2018-06-28 DIAGNOSIS — I2510 Atherosclerotic heart disease of native coronary artery without angina pectoris: Secondary | ICD-10-CM | POA: Diagnosis not present

## 2018-06-28 DIAGNOSIS — R600 Localized edema: Secondary | ICD-10-CM | POA: Diagnosis not present

## 2018-06-28 DIAGNOSIS — R0602 Shortness of breath: Secondary | ICD-10-CM | POA: Diagnosis not present

## 2018-06-28 DIAGNOSIS — I208 Other forms of angina pectoris: Secondary | ICD-10-CM | POA: Diagnosis not present

## 2018-07-01 ENCOUNTER — Other Ambulatory Visit: Payer: Self-pay | Admitting: Family Medicine

## 2018-07-01 DIAGNOSIS — G4701 Insomnia due to medical condition: Secondary | ICD-10-CM

## 2018-07-02 ENCOUNTER — Encounter: Payer: Medicare Other | Admitting: Family Medicine

## 2018-07-08 ENCOUNTER — Other Ambulatory Visit: Payer: Self-pay

## 2018-07-08 NOTE — Patient Outreach (Signed)
[facility]) Care Management  07/08/2018  CAYLIN RABY Jun 30, 1940 [id number]   Medication Adherence call to Mr. Mark Hassey [facility] spoke with patient he is due on Atorvastatin 10 mg  patient  already received a deliver for this month but wants to star receiving a 90 days supply, call [facility] they will fill his prescription for a 90 days supply and deliver to the patient. Mr. Menees is showing past due under [place].   [facility] Management Direct Dial [phone]  Fax [phone] [email or web address]

## 2018-07-26 DIAGNOSIS — L97521 Non-pressure chronic ulcer of other part of left foot limited to breakdown of skin: Secondary | ICD-10-CM | POA: Diagnosis not present

## 2018-07-26 DIAGNOSIS — I739 Peripheral vascular disease, unspecified: Secondary | ICD-10-CM | POA: Diagnosis not present

## 2018-07-27 ENCOUNTER — Encounter (INDEPENDENT_AMBULATORY_CARE_PROVIDER_SITE_OTHER): Payer: Medicare Other

## 2018-07-27 ENCOUNTER — Ambulatory Visit (INDEPENDENT_AMBULATORY_CARE_PROVIDER_SITE_OTHER): Payer: Medicare Other | Admitting: Nurse Practitioner

## 2018-07-28 ENCOUNTER — Other Ambulatory Visit: Payer: Self-pay | Admitting: Family Medicine

## 2018-07-28 DIAGNOSIS — N401 Enlarged prostate with lower urinary tract symptoms: Secondary | ICD-10-CM

## 2018-08-05 ENCOUNTER — Other Ambulatory Visit: Payer: Self-pay | Admitting: Family Medicine

## 2018-08-05 DIAGNOSIS — N401 Enlarged prostate with lower urinary tract symptoms: Secondary | ICD-10-CM

## 2018-08-13 ENCOUNTER — Ambulatory Visit (INDEPENDENT_AMBULATORY_CARE_PROVIDER_SITE_OTHER): Payer: Medicare Other | Admitting: Vascular Surgery

## 2018-08-13 ENCOUNTER — Encounter (INDEPENDENT_AMBULATORY_CARE_PROVIDER_SITE_OTHER): Payer: Medicare Other

## 2018-08-16 ENCOUNTER — Other Ambulatory Visit: Payer: Self-pay | Admitting: Family Medicine

## 2018-08-16 DIAGNOSIS — I739 Peripheral vascular disease, unspecified: Secondary | ICD-10-CM

## 2018-08-16 DIAGNOSIS — I70213 Atherosclerosis of native arteries of extremities with intermittent claudication, bilateral legs: Secondary | ICD-10-CM

## 2018-09-06 DIAGNOSIS — I739 Peripheral vascular disease, unspecified: Secondary | ICD-10-CM | POA: Diagnosis not present

## 2018-09-06 DIAGNOSIS — L851 Acquired keratosis [keratoderma] palmaris et plantaris: Secondary | ICD-10-CM | POA: Diagnosis not present

## 2018-09-06 DIAGNOSIS — B351 Tinea unguium: Secondary | ICD-10-CM | POA: Diagnosis not present

## 2018-09-06 DIAGNOSIS — L97522 Non-pressure chronic ulcer of other part of left foot with fat layer exposed: Secondary | ICD-10-CM | POA: Diagnosis not present

## 2018-09-10 ENCOUNTER — Ambulatory Visit (INDEPENDENT_AMBULATORY_CARE_PROVIDER_SITE_OTHER): Payer: Medicare Other | Admitting: Vascular Surgery

## 2018-09-10 ENCOUNTER — Other Ambulatory Visit: Payer: Self-pay

## 2018-09-10 ENCOUNTER — Encounter (INDEPENDENT_AMBULATORY_CARE_PROVIDER_SITE_OTHER): Payer: Self-pay | Admitting: Vascular Surgery

## 2018-09-10 ENCOUNTER — Other Ambulatory Visit: Payer: Self-pay | Admitting: Family Medicine

## 2018-09-10 ENCOUNTER — Ambulatory Visit (INDEPENDENT_AMBULATORY_CARE_PROVIDER_SITE_OTHER): Payer: Medicare Other

## 2018-09-10 VITALS — BP 147/67 | HR 78 | Resp 14 | Ht 71.0 in | Wt 160.0 lb

## 2018-09-10 DIAGNOSIS — L97519 Non-pressure chronic ulcer of other part of right foot with unspecified severity: Secondary | ICD-10-CM

## 2018-09-10 DIAGNOSIS — Z87891 Personal history of nicotine dependence: Secondary | ICD-10-CM

## 2018-09-10 DIAGNOSIS — I70245 Atherosclerosis of native arteries of left leg with ulceration of other part of foot: Secondary | ICD-10-CM

## 2018-09-10 DIAGNOSIS — E1142 Type 2 diabetes mellitus with diabetic polyneuropathy: Secondary | ICD-10-CM

## 2018-09-10 DIAGNOSIS — R600 Localized edema: Secondary | ICD-10-CM | POA: Diagnosis not present

## 2018-09-10 DIAGNOSIS — L97529 Non-pressure chronic ulcer of other part of left foot with unspecified severity: Secondary | ICD-10-CM

## 2018-09-10 DIAGNOSIS — G629 Polyneuropathy, unspecified: Secondary | ICD-10-CM

## 2018-09-10 DIAGNOSIS — M7989 Other specified soft tissue disorders: Secondary | ICD-10-CM

## 2018-09-10 DIAGNOSIS — I129 Hypertensive chronic kidney disease with stage 1 through stage 4 chronic kidney disease, or unspecified chronic kidney disease: Secondary | ICD-10-CM

## 2018-09-10 DIAGNOSIS — I7025 Atherosclerosis of native arteries of other extremities with ulceration: Secondary | ICD-10-CM

## 2018-09-10 DIAGNOSIS — N183 Chronic kidney disease, stage 3 unspecified: Secondary | ICD-10-CM

## 2018-09-10 DIAGNOSIS — I70221 Atherosclerosis of native arteries of extremities with rest pain, right leg: Secondary | ICD-10-CM

## 2018-09-10 DIAGNOSIS — Z95820 Peripheral vascular angioplasty status with implants and grafts: Secondary | ICD-10-CM

## 2018-09-10 DIAGNOSIS — E7800 Pure hypercholesterolemia, unspecified: Secondary | ICD-10-CM

## 2018-09-10 DIAGNOSIS — I70235 Atherosclerosis of native arteries of right leg with ulceration of other part of foot: Secondary | ICD-10-CM | POA: Diagnosis not present

## 2018-09-10 DIAGNOSIS — I70229 Atherosclerosis of native arteries of extremities with rest pain, unspecified extremity: Secondary | ICD-10-CM | POA: Insufficient documentation

## 2018-09-10 DIAGNOSIS — I10 Essential (primary) hypertension: Secondary | ICD-10-CM

## 2018-09-10 DIAGNOSIS — Z79899 Other long term (current) drug therapy: Secondary | ICD-10-CM

## 2018-09-10 NOTE — Progress Notes (Signed)
MRN : [id number]  Randy Howard is a 78 y.o. ([birth year]) male who presents with chief complaint of  Chief Complaint  Patient presents with  . Follow-up  .  History of Present Illness: Patient returns today in follow up of his PAD which has been longstanding.  He has undergone multiple previous surgeries and revascularizations to both lower extremities over the years.  About 3 months ago, he underwent left lower extremity revascularization for nonhealing ulceration of the left foot.  The ulcer is still present on the left foot, but it is slowly improving and looks clean today.  A couple of weeks ago, he had unexplained left leg swelling which has since gone back to his baseline of mild leg swelling.  He is having a lot of pain in his right foot and lower leg.  He has a callus on the foot but no obvious open wound.  There is an area on the tip of his great toe that looks like an impending ulceration.  He has pain in that right foot at rest.  His right ABI has dropped down to 0.49 with monophasic and somewhat blunted waveforms.  His left ABI is 0.73 with a very strong monophasic waveform and a good digital waveform.  Current Outpatient Medications  Medication Sig Dispense Refill  . aspirin EC 81 MG tablet Take 1 tablet (81 mg total) by mouth daily. 150 tablet 2  . atorvastatin (LIPITOR) 10 MG tablet Take 1 tablet (10 mg total) by mouth daily. 30 tablet 11  . baclofen (LIORESAL) 10 MG tablet TAKE 1 TABLET BY MOUTH THREE TIMES DAILYAS NEEDED 90 each 3  . benazepril (LOTENSIN) 40 MG tablet TAKE 1 TABLET BY MOUTH ONCE DAILY 90 tablet 3  . busPIRone (BUSPAR) 5 MG tablet TAKE 1 TABLET BY MOUTH ONCE DAILY AS NEEDED. 90 tablet 3  . clopidogrel (PLAVIX) 75 MG tablet TAKE 1 TABLET BY MOUTH ONCE DAILY. 90 tablet 3  . finasteride (PROSCAR) 5 MG tablet TAKE 1 TABLET BY MOUTH ONCE DAILY. 90 tablet 1  . gabapentin (NEURONTIN) 100 MG capsule Take 2 capsules in AM and 1-2 capsules at bedtime 360 capsule 3  .  glimepiride (AMARYL) 4 MG tablet TAKE 1 TABLET BY MOUTH ONCE EVERY MORNING 90 tablet 1  . hydrochlorothiazide (HYDRODIURIL) 25 MG tablet Take 25 mg by mouth daily.    . hydrOXYzine (ATARAX/VISTARIL) 10 MG tablet TAKE 1 TABLET BY MOUTH 3 TIMES DAILY AS NEEDED. 90 tablet 1  . meclizine (ANTIVERT) 25 MG tablet Take 1 tablet (25 mg total) by mouth daily as needed for dizziness. 90 tablet 3  . metoprolol tartrate (LOPRESSOR) 50 MG tablet TAKE 1 TABLET BY MOUTH TWICE DAILY 180 tablet 1  . tamsulosin (FLOMAX) 0.4 MG CAPS capsule TAKE 1 CAPSULE BY MOUTH ONCE DAILY 90 capsule 1  . torsemide (DEMADEX) 20 MG tablet TAKE 20 MG BY MOUTH TWICE DAILY FOR 3 DAYS, THEN 20 MG DAILY THEREAFTER    . traMADol (ULTRAM) 50 MG tablet Take 1 tablet (50 mg total) by mouth every 12 (twelve) hours as needed. 30 tablet 2  . lovastatin (MEVACOR) 40 MG tablet Take by mouth.    . Magnesium Oxide, Antacid, 500 MG CAPS Take by mouth.    . mirtazapine (REMERON) 15 MG tablet     . nitroGLYCERIN (NITROSTAT) 0.4 MG SL tablet Place 1 tablet (0.4 mg total) under the tongue every 5 (five) minutes as needed for chest pain. 50 tablet 3  . Potassium  99 MG TABS Take by mouth.    . triamcinolone ointment (KENALOG) 0.1 % Apply topically.     No current facility-administered medications for this visit.     Past Medical History:  Diagnosis Date  . Arthritis   . Coronary artery disease   . Elevated lipids   . Hyperlipidemia   . Hypertension   . Neuropathy   . Nocturia   . Obstructive sleep apnea   . Peripheral vascular disease ([place])   . Prostate enlargement   . PVD (peripheral vascular disease) ([place])   . Right carotid bruit   . Skin cancer    Followed by Dr. Evorn Gong  . Skin cancer   . Tremor     Past Surgical History:  Procedure Laterality Date  . CARDIAC CATHETERIZATION    . CAROTID ARTERY ANGIOPLASTY Left 09/30/2011  . CORONARY ARTERY BYPASS GRAFT  2006  . ENDARTERECTOMY FEMORAL Bilateral 11/01/2014   Procedure:  ENDARTERECTOMY FEMORAL;  Surgeon: Algernon Huxley, MD;  Location: [facility];  Service: Vascular;  Laterality: Bilateral;  . LOWER EXTREMITY ANGIOGRAPHY Left 04/26/2018   Procedure: LOWER EXTREMITY ANGIOGRAPHY;  Surgeon: Algernon Huxley, MD;  Location: [place] [facility];  Service: Cardiovascular;  Laterality: Left;  . PERIPHERAL VASCULAR CATHETERIZATION Left 10/09/2014   Procedure: Lower Extremity Angiography;  Surgeon: Algernon Huxley, MD;  Location: [place] [facility];  Service: Cardiovascular;  Laterality: Left;  . PERIPHERAL VASCULAR CATHETERIZATION  10/09/2014   Procedure: Lower Extremity Intervention;  Surgeon: Algernon Huxley, MD;  Location: [place] [facility];  Service: Cardiovascular;;    Social History        Tobacco Use  . Smoking status: Former Smoker    Packs/day: 1.00    Years: 25.00    Pack years: 25.00    Types: Cigarettes    Last attempt to quit: 03/24/1985    Years since quitting: 33.0  . Smokeless tobacco: Never Used  Substance Use Topics  . Alcohol use: No    Alcohol/week: 0.0 standard drinks  . Drug use: No    Family History      Family History  Problem Relation Age of Onset  . Heart disease Sister   . Heart disease Sister   . Diabetes Sister   . Diabetes Sister   . Diabetes Brother   No bleeding or clotting disorders      Allergies  Allergen Reactions  . Oxycodone Nausea Only     REVIEW OF SYSTEMS(Negative unless checked)  Constitutional: [] ??Weight loss [] ??Fever [] ??Chills Cardiac: [] ??Chest pain [] ??Chest pressure [] ??Palpitations [] ??Shortness of breath when laying flat [] ??Shortness of breath at rest [] ??Shortness of breath with exertion. Vascular: [x] ??Pain in legs with walking [x] ??Pain in legs at rest [] ??Pain in legs when laying flat [x] ??Claudication [] ??Pain in feet when walking [] ??Pain in feet at rest [] ??Pain in feet when laying flat [] ??History of DVT [] ??Phlebitis [] ??Swelling  in legs [] ??Varicose veins [x] ??Non-healing ulcers Pulmonary: [] ??Uses home oxygen [] ??Productive cough [] ??Hemoptysis [] ??Wheeze [] ??COPD [] ??Asthma Neurologic: [] ??Dizziness [] ??Blackouts [] ??Seizures [] ??History of stroke [] ??History of TIA [] ??Aphasia [] ??Temporary blindness [] ??Dysphagia [] ??Weakness or numbness in arms [x] ??Weakness or numbness in legs Musculoskeletal: [x] ??Arthritis [] ??Joint swelling [] ??Joint pain [] ??Low back pain Hematologic: [] ??Easy bruising [] ??Easy bleeding [] ??Hypercoagulable state [] ??Anemic  Gastrointestinal: [] ??Blood in stool [] ??Vomiting blood [] ??Gastroesophageal reflux/heartburn [] ??Abdominal pain Genitourinary: [] ??Chronic kidney disease [] ??Difficult urination [] ??Frequent urination [] ??Burning with urination [] ??Hematuria Skin: [] ??Rashes [x] ??Ulcers [x] ??Wounds Psychological: [] ??History of anxiety [] ??History of major depression.     Physical Examination  BP (!) 147/67 (BP Location: Left  Arm, Patient Position: Sitting, Cuff Size: Normal)   Pulse 78   Resp 14   Ht 5\' 11"  (1.803 m)   Wt 160 lb (72.6 kg)   BMI 22.32 kg/m  Gen:  WD/WN, NAD Head: [place]/AT, No temporalis wasting. Ear/Nose/Throat: Hearing grossly intact, nares w/o erythema or drainage Eyes: Conjunctiva clear. Sclera non-icteric Neck: Supple.  Trachea midline Pulmonary:  Good air movement, no use of accessory muscles.  Cardiac: RRR, no JVD Vascular:  Vessel Right Left  Radial Palpable Palpable                          PT Not Palpable Not Palpable  DP Not Palpable 1+ Palpable   Gastrointestinal: soft, non-tender/non-distended.  Musculoskeletal: M/S 5/5 throughout.  No deformity or atrophy.  Less than 1 cm clean ulceration on the base of the left foot that is slightly improved from his previous visit.  1-2+ bilateral lower extremity edema.  Walks with a cane Neurologic: Sensation somewhat decreased in  extremities.  Symmetrical.  Speech is fluent.  Psychiatric: Judgment intact, Mood & affect appropriate for pt's clinical situation. Dermatologic: Left foot wound as above       Labs No results found for this or any previous visit (from the past 2160 hour(s)).  Radiology No results found.  Assessment/Plan Swelling of limb Recommend compression and elevation. Is already on diuretic therapy.  Had worsening of his left leg swelling a few weeks ago but this has returned to his baseline.  Elevated cholesterol lipid control important in reducing the progression of atherosclerotic disease. Continue statin therapy   Type 2 diabetes mellitus with diabetic polyneuropathy, without long-term current use of insulin (HCC) blood glucose control important in reducing the progression of atherosclerotic disease. Also, involved in wound healing. On appropriate medications.   Hypertension blood pressure control important in reducing the progression of atherosclerotic disease. On appropriate oral medications.  CKD (chronic kidney disease), stage III (HCC) Likely contributes to LE swelling.  Try to limit contrast with any procedures.  Atherosclerosis of native arteries of the extremities with ulceration ([place]) His right ABI has dropped down to 0.49 with monophasic and somewhat blunted waveforms.  His left ABI is 0.73 with a very strong monophasic waveform and a good digital waveform. Perfusion improved after revascularization with what appears to be a good digital waveform and decent pressure.  Continue to monitor.  Atherosclerosis of native arteries of extremity with rest pain ([place]) His right ABI has dropped down to 0.49 with monophasic and somewhat blunted waveforms.  His left ABI is 0.73 with a very strong monophasic waveform and a good digital waveform. This now represents a critical and limb threatening situation on the right leg.  His ABIs in the markedly reduced range and his symptoms are  concerning.  At this point, a right lower extremity angiogram with possible revascularization will be planned.  Risks and benefits were discussed with the patient and he is agreeable to proceed.    Leotis Pain, MD  09/10/2018 11:18 AM    This note was created with Dragon medical transcription system.  Any errors from dictation are purely unintentional

## 2018-09-10 NOTE — Assessment & Plan Note (Signed)
His right ABI has dropped down to 0.49 with monophasic and somewhat blunted waveforms.  His left ABI is 0.73 with a very strong monophasic waveform and a good digital waveform. This now represents a critical and limb threatening situation on the right leg.  His ABIs in the markedly reduced range and his symptoms are concerning.  At this point, a right lower extremity angiogram with possible revascularization will be planned.  Risks and benefits were discussed with the patient and he is agreeable to proceed.

## 2018-09-10 NOTE — Assessment & Plan Note (Signed)
Likely contributes to LE swelling.  Try to limit contrast with any procedures.

## 2018-09-10 NOTE — Patient Instructions (Signed)
Angiogram  An angiogram is a procedure used to examine the blood vessels. In this procedure, contrast dye is injected through a long, thin tube (catheter) into an artery. X-rays are then taken, which show if there is a blockage or problem in a blood vessel. The catheter may be inserted in:  Your groin area. This is the most common.  The fold of your arm, near your elbow.  Your wrist. Tell a health care provider about:  Any allergies you have, including allergies to shellfish or contrast dye.  All medicines you are taking, including vitamins, herbs, eye drops, creams, and over-the-counter medicines.  Any problems you or family members have had with anesthetic medicines.  Any blood disorders you have.  Any surgeries you have had.  Any previous kidney problems or failure you have had.  Any medical conditions you have.  Whether you are pregnant or may be pregnant.  Whether you are breastfeeding. What are the risks? Generally, this is a safe procedure. However, problems may occur, including:  Infection or bruising at the catheter area.  Damage to other structures or organs, including rupture of blood vessels or damage to arteries.  Allergic reaction to the contrast dye used.  Kidney damage from the contrast dye used.  Blood clots that can lead to a stroke or heart attack. What happens before the procedure? Staying hydrated Follow instructions from your health care provider about hydration, which may include:  Up to 2 hours before the procedure - you may continue to drink clear liquids, such as water, clear fruit juice, black coffee, and plain tea. Eating and drinking restrictions Follow instructions from your health care provider about eating and drinking, which may include:  8 hours before the procedure - stop eating heavy meals or foods such as meat, fried foods, or fatty foods.  6 hours before the procedure - stop eating light meals or foods, such as toast or cereal.   6 hours before the procedure - stop drinking milk or drinks that contain milk.  2 hours before the procedure - stop drinking clear liquids. General instructions  Ask your health care provider about: ? Changing or stopping your normal medicines. This is important if you take diabetes medicines or blood thinners. ? Taking medicines such as aspirin and ibuprofen. These medicines can thin your blood. Do not take these medicines before your procedure if your doctor tells you not to.  You may have blood samples taken.  Plan to have someone take you home from the hospital or clinic.  If you will be going home right after the procedure, plan to have someone with you for 24 hours. What happens during the procedure?  To reduce your risk of infection: ? Your health care team will wash or sanitize their hands. ? Your skin will be washed with soap. ? Hair may be removed from the insertion area.  You will lie on your back on an X-ray table. You may be strapped to the table if it is tilted.  An IV tube will be inserted into one of your veins.  Electrodes may be placed on your chest to monitor your heart rate during the procedure.  You will be given one or more of the following: ? A medicine to help you relax (sedative). ? A medicine to numb the area where the catheter will be inserted (local anesthetic).  The catheter will be inserted into an artery using a guide wire. A type of X-ray (fluoroscopy) will be used to help   guide the catheter to the blood vessel to be examined.  A contrast dye will then be injected into the catheter, and X-rays will be taken. The contrast will help to show where any narrowing or blockages are located in the blood vessels. You may feel flushed as the contrast dye is injected.  After the X-ray is complete, the catheter will be removed.  A bandage (dressing) will be placed over the site where the catheter was inserted. Pressure will be applied to help stop any  bleeding. The procedure may vary among health care providers and hospitals. What happens after the procedure?  Your blood pressure, heart rate, breathing rate, and blood oxygen level will be monitored until the medicines you were given have worn off.  You will be kept in bed lying flat for several hours. If the catheter was inserted through your leg, you will be instructed not to bend or cross your legs.  The insertion area and the pulse in your feet or wrist will be checked frequently.  You will be instructed to drink plenty of fluids. This will help wash the contrast dye out of your body.  Additional blood tests and X-rays may be done.  Tests to check the electrical activity in your heart (electrocardiogram) may be done.  Do not drive for 24 hours if you received a sedative.  It is up to you to get the results of your procedure. Ask your health care provider, or the department that is doing the procedure, when your results will be ready. Summary  An angiogram is a procedure used to examine the blood vessels.  In this procedure, contrast dye is injected through a long, thin tube (catheter) into an artery. X-rays are then taken.  Before the procedure, follow your health care provider's instructions about eating and drinking restrictions. You may be asked to stop eating and drinking several hours before the procedure.  After the procedure, you will need to lie flat for several hours and drink plenty of fluids. This information is not intended to replace advice given to you by your health care provider. Make sure you discuss any questions you have with your health care provider. Document Released: 12/18/2004 Document Revised: 07/15/2016 Document Reviewed: 04/16/2016 Elsevier Interactive Patient Education  2019 Elsevier Inc.  

## 2018-09-10 NOTE — Assessment & Plan Note (Signed)
His right ABI has dropped down to 0.49 with monophasic and somewhat blunted waveforms.  His left ABI is 0.73 with a very strong monophasic waveform and a good digital waveform. Perfusion improved after revascularization with what appears to be a good digital waveform and decent pressure.  Continue to monitor.

## 2018-09-14 ENCOUNTER — Telehealth (INDEPENDENT_AMBULATORY_CARE_PROVIDER_SITE_OTHER): Payer: Self-pay

## 2018-09-14 ENCOUNTER — Encounter (INDEPENDENT_AMBULATORY_CARE_PROVIDER_SITE_OTHER): Payer: Self-pay

## 2018-09-14 NOTE — Telephone Encounter (Signed)
I spoke with the patient and he is scheduled with Dr. Lucky Cowboy for 09/23/2018 with a 8:00 am arrival time at the [facility] for Covid testing and his procedure is 2 hours following his testing. Pre-procedure instructions will be mailed out to the patient as well.

## 2018-09-20 ENCOUNTER — Other Ambulatory Visit (INDEPENDENT_AMBULATORY_CARE_PROVIDER_SITE_OTHER): Payer: Self-pay | Admitting: Nurse Practitioner

## 2018-09-22 MED ORDER — CEFAZOLIN SODIUM-DEXTROSE 2-4 GM/100ML-% IV SOLN
2.0000 g | Freq: Once | INTRAVENOUS | Status: AC
  Administered 2018-09-23: 2 g via INTRAVENOUS

## 2018-09-23 ENCOUNTER — Inpatient Hospital Stay
Admission: AD | Admit: 2018-09-23 | Discharge: 2018-09-26 | DRG: 253 | Disposition: A | Discharge status: Home Health | Payer: Medicare Other | Attending: Surgery | Admitting: Surgery

## 2018-09-23 ENCOUNTER — Other Ambulatory Visit (INDEPENDENT_AMBULATORY_CARE_PROVIDER_SITE_OTHER): Payer: Self-pay | Admitting: Nurse Practitioner

## 2018-09-23 ENCOUNTER — Encounter
Admission: AD | Disposition: A | Discharge status: Home / Self Care | Payer: Self-pay | Source: Home / Self Care | Attending: Vascular Surgery

## 2018-09-23 ENCOUNTER — Other Ambulatory Visit
Admission: RE | Admit: 2018-09-23 | Discharge: 2018-09-23 | Disposition: A | Discharge status: Home / Self Care | Payer: Medicare Other | Source: Ambulatory Visit | Attending: Vascular Surgery | Admitting: Vascular Surgery

## 2018-09-23 ENCOUNTER — Other Ambulatory Visit: Payer: Self-pay

## 2018-09-23 ENCOUNTER — Other Ambulatory Visit (INDEPENDENT_AMBULATORY_CARE_PROVIDER_SITE_OTHER): Payer: Self-pay | Admitting: Vascular Surgery

## 2018-09-23 DIAGNOSIS — E119 Type 2 diabetes mellitus without complications: Secondary | ICD-10-CM | POA: Diagnosis not present

## 2018-09-23 DIAGNOSIS — E1151 Type 2 diabetes mellitus with diabetic peripheral angiopathy without gangrene: Principal | ICD-10-CM | POA: Diagnosis present

## 2018-09-23 DIAGNOSIS — E1122 Type 2 diabetes mellitus with diabetic chronic kidney disease: Secondary | ICD-10-CM | POA: Diagnosis present

## 2018-09-23 DIAGNOSIS — Z7982 Long term (current) use of aspirin: Secondary | ICD-10-CM

## 2018-09-23 DIAGNOSIS — E11621 Type 2 diabetes mellitus with foot ulcer: Secondary | ICD-10-CM | POA: Diagnosis present

## 2018-09-23 DIAGNOSIS — L97529 Non-pressure chronic ulcer of other part of left foot with unspecified severity: Secondary | ICD-10-CM | POA: Diagnosis not present

## 2018-09-23 DIAGNOSIS — Z85828 Personal history of other malignant neoplasm of skin: Secondary | ICD-10-CM

## 2018-09-23 DIAGNOSIS — Z1159 Encounter for screening for other viral diseases: Secondary | ICD-10-CM

## 2018-09-23 DIAGNOSIS — M79651 Pain in right thigh: Secondary | ICD-10-CM | POA: Diagnosis not present

## 2018-09-23 DIAGNOSIS — Z79891 Long term (current) use of opiate analgesic: Secondary | ICD-10-CM | POA: Diagnosis not present

## 2018-09-23 DIAGNOSIS — I70239 Atherosclerosis of native arteries of right leg with ulceration of unspecified site: Secondary | ICD-10-CM | POA: Diagnosis not present

## 2018-09-23 DIAGNOSIS — Z885 Allergy status to narcotic agent status: Secondary | ICD-10-CM | POA: Diagnosis not present

## 2018-09-23 DIAGNOSIS — I10 Essential (primary) hypertension: Secondary | ICD-10-CM | POA: Diagnosis not present

## 2018-09-23 DIAGNOSIS — I129 Hypertensive chronic kidney disease with stage 1 through stage 4 chronic kidney disease, or unspecified chronic kidney disease: Secondary | ICD-10-CM | POA: Diagnosis present

## 2018-09-23 DIAGNOSIS — Z951 Presence of aortocoronary bypass graft: Secondary | ICD-10-CM | POA: Diagnosis not present

## 2018-09-23 DIAGNOSIS — G4733 Obstructive sleep apnea (adult) (pediatric): Secondary | ICD-10-CM | POA: Diagnosis not present

## 2018-09-23 DIAGNOSIS — I97638 Postprocedural hematoma of a circulatory system organ or structure following other circulatory system procedure: Secondary | ICD-10-CM | POA: Diagnosis not present

## 2018-09-23 DIAGNOSIS — Z87891 Personal history of nicotine dependence: Secondary | ICD-10-CM | POA: Diagnosis not present

## 2018-09-23 DIAGNOSIS — I70229 Atherosclerosis of native arteries of extremities with rest pain, unspecified extremity: Secondary | ICD-10-CM | POA: Diagnosis not present

## 2018-09-23 DIAGNOSIS — D62 Acute posthemorrhagic anemia: Secondary | ICD-10-CM | POA: Diagnosis not present

## 2018-09-23 DIAGNOSIS — Z833 Family history of diabetes mellitus: Secondary | ICD-10-CM

## 2018-09-23 DIAGNOSIS — N183 Chronic kidney disease, stage 3 (moderate): Secondary | ICD-10-CM | POA: Diagnosis present

## 2018-09-23 DIAGNOSIS — Z7902 Long term (current) use of antithrombotics/antiplatelets: Secondary | ICD-10-CM | POA: Diagnosis not present

## 2018-09-23 DIAGNOSIS — E785 Hyperlipidemia, unspecified: Secondary | ICD-10-CM | POA: Diagnosis present

## 2018-09-23 DIAGNOSIS — Z79899 Other long term (current) drug therapy: Secondary | ICD-10-CM

## 2018-09-23 DIAGNOSIS — Z7984 Long term (current) use of oral hypoglycemic drugs: Secondary | ICD-10-CM

## 2018-09-23 DIAGNOSIS — I70222 Atherosclerosis of native arteries of extremities with rest pain, left leg: Secondary | ICD-10-CM | POA: Diagnosis present

## 2018-09-23 DIAGNOSIS — N179 Acute kidney failure, unspecified: Secondary | ICD-10-CM | POA: Diagnosis not present

## 2018-09-23 DIAGNOSIS — E1140 Type 2 diabetes mellitus with diabetic neuropathy, unspecified: Secondary | ICD-10-CM | POA: Diagnosis present

## 2018-09-23 DIAGNOSIS — I2510 Atherosclerotic heart disease of native coronary artery without angina pectoris: Secondary | ICD-10-CM | POA: Diagnosis present

## 2018-09-23 DIAGNOSIS — I7090 Unspecified atherosclerosis: Secondary | ICD-10-CM | POA: Diagnosis present

## 2018-09-23 DIAGNOSIS — Z8249 Family history of ischemic heart disease and other diseases of the circulatory system: Secondary | ICD-10-CM

## 2018-09-23 DIAGNOSIS — S8010XA Contusion of unspecified lower leg, initial encounter: Secondary | ICD-10-CM | POA: Diagnosis not present

## 2018-09-23 DIAGNOSIS — I70221 Atherosclerosis of native arteries of extremities with rest pain, right leg: Secondary | ICD-10-CM | POA: Diagnosis not present

## 2018-09-23 HISTORY — PX: LOWER EXTREMITY ANGIOGRAPHY: CATH118251

## 2018-09-23 LAB — CBC
HCT: 32.1 % — ABNORMAL LOW (ref 39.0–52.0)
Hemoglobin: 10.7 g/dL — ABNORMAL LOW (ref 13.0–17.0)
MCH: 30.2 pg (ref 26.0–34.0)
MCHC: 33.3 g/dL (ref 30.0–36.0)
MCV: 90.7 fL (ref 80.0–100.0)
Platelets: 105 10*3/uL — ABNORMAL LOW (ref 150–400)
RBC: 3.54 MIL/uL — ABNORMAL LOW (ref 4.22–5.81)
RDW: 13.2 % (ref 11.5–15.5)
WBC: 5.5 10*3/uL (ref 4.0–10.5)
nRBC: 0 % (ref 0.0–0.2)

## 2018-09-23 LAB — GLUCOSE, CAPILLARY
Glucose-Capillary: 143 mg/dL — ABNORMAL HIGH (ref 70–99)
Glucose-Capillary: 146 mg/dL — ABNORMAL HIGH (ref 70–99)
Glucose-Capillary: 132 mg/dL — ABNORMAL HIGH (ref 70–99)
Glucose-Capillary: 136 mg/dL — ABNORMAL HIGH (ref 70–99)

## 2018-09-23 LAB — SARS CORONAVIRUS 2 BY RT PCR (HOSPITAL ORDER, PERFORMED IN ~~LOC~~ HOSPITAL LAB): SARS Coronavirus 2: NEGATIVE

## 2018-09-23 LAB — HEMOGLOBIN A1C
Hgb A1c MFr Bld: 8.1 % — ABNORMAL HIGH (ref 4.8–5.6)
Mean Plasma Glucose: 185.77 mg/dL

## 2018-09-23 LAB — BUN: BUN: 36 mg/dL — ABNORMAL HIGH (ref 8–23)

## 2018-09-23 LAB — CREATININE, SERUM
Creatinine, Ser: 1.72 mg/dL — ABNORMAL HIGH (ref 0.61–1.24)
GFR calc Af Amer: 43 mL/min — ABNORMAL LOW (ref >60)
GFR calc non Af Amer: 37 mL/min — ABNORMAL LOW (ref >60)

## 2018-09-23 SURGERY — LOWER EXTREMITY ANGIOGRAPHY
Anesthesia: Moderate Sedation | Site: Leg Lower | Laterality: Right

## 2018-09-23 MED ORDER — HYDROMORPHONE HCL 1 MG/ML IJ SOLN: 1.0000 mg | Freq: Once | INTRAMUSCULAR | Status: DC | PRN

## 2018-09-23 MED ORDER — ONDANSETRON HCL 4 MG/2ML IJ SOLN
4.0000 mg | Freq: Four times a day (QID) | INTRAMUSCULAR | Status: DC | PRN
  Administered 2018-09-24 (×2): 4 mg via INTRAVENOUS
  Filled 2018-09-23 (×2): qty 2

## 2018-09-23 MED ORDER — MORPHINE SULFATE (PF) 4 MG/ML IV SOLN: 2.0000 mg | INTRAVENOUS | Status: DC | PRN

## 2018-09-23 MED ORDER — METOPROLOL TARTRATE 50 MG PO TABS
50.0000 mg | ORAL_TABLET | Freq: Two times a day (BID) | ORAL | Status: DC
  Administered 2018-09-23 – 2018-09-26 (×7): 50 mg via ORAL
  Filled 2018-09-23 (×7): qty 1

## 2018-09-23 MED ORDER — FINASTERIDE 5 MG PO TABS
5.0000 mg | ORAL_TABLET | Freq: Every day | ORAL | Status: DC
  Administered 2018-09-23 – 2018-09-26 (×4): 5 mg via ORAL
  Filled 2018-09-23 (×5): qty 1

## 2018-09-23 MED ORDER — HEPARIN SODIUM (PORCINE) 5000 UNIT/ML IJ SOLN
5000.0000 [IU] | Freq: Three times a day (TID) | INTRAMUSCULAR | Status: DC
  Administered 2018-09-23 – 2018-09-26 (×10): 5000 [IU] via SUBCUTANEOUS
  Filled 2018-09-23 (×9): qty 1

## 2018-09-23 MED ORDER — FAMOTIDINE 20 MG PO TABS: 40.0000 mg | ORAL_TABLET | Freq: Once | ORAL | Status: DC | PRN

## 2018-09-23 MED ORDER — TRAMADOL HCL 50 MG PO TABS: 50.0000 mg | ORAL_TABLET | Freq: Four times a day (QID) | ORAL | Status: DC | PRN

## 2018-09-23 MED ORDER — MIRTAZAPINE 15 MG PO TABS
15.0000 mg | ORAL_TABLET | Freq: Every day | ORAL | Status: DC
  Administered 2018-09-23 – 2018-09-25 (×3): 15 mg via ORAL
  Filled 2018-09-23 (×3): qty 1

## 2018-09-23 MED ORDER — ATORVASTATIN CALCIUM 10 MG PO TABS
10.0000 mg | ORAL_TABLET | Freq: Every day | ORAL | Status: DC
  Administered 2018-09-23 – 2018-09-25 (×3): 10 mg via ORAL
  Filled 2018-09-23 (×3): qty 1

## 2018-09-23 MED ORDER — BACLOFEN 10 MG PO TABS
10.0000 mg | ORAL_TABLET | Freq: Three times a day (TID) | ORAL | Status: DC
  Administered 2018-09-23 – 2018-09-26 (×9): 10 mg via ORAL
  Filled 2018-09-23 (×11): qty 1

## 2018-09-23 MED ORDER — SODIUM CHLORIDE 0.9 % IV BOLUS: 500.0000 mL | Freq: Once | INTRAVENOUS | Status: DC

## 2018-09-23 MED ORDER — HEPARIN SODIUM (PORCINE) 1000 UNIT/ML IJ SOLN
INTRAMUSCULAR | Status: AC
  Filled 2018-09-23: qty 1

## 2018-09-23 MED ORDER — CEFAZOLIN SODIUM-DEXTROSE 2-4 GM/100ML-% IV SOLN
INTRAVENOUS | Status: AC
  Administered 2018-09-23: 2 g via INTRAVENOUS
  Filled 2018-09-23: qty 100

## 2018-09-23 MED ORDER — METHYLPREDNISOLONE SODIUM SUCC 125 MG IJ SOLR: 125.0000 mg | Freq: Once | INTRAMUSCULAR | Status: DC | PRN

## 2018-09-23 MED ORDER — ONDANSETRON HCL 4 MG/2ML IJ SOLN: 4.0000 mg | Freq: Four times a day (QID) | INTRAMUSCULAR | Status: DC | PRN

## 2018-09-23 MED ORDER — BENAZEPRIL HCL 20 MG PO TABS
40.0000 mg | ORAL_TABLET | Freq: Every day | ORAL | Status: DC
  Administered 2018-09-23 – 2018-09-25 (×3): 40 mg via ORAL
  Filled 2018-09-23 (×3): qty 2

## 2018-09-23 MED ORDER — HEPARIN SODIUM (PORCINE) 1000 UNIT/ML IJ SOLN
INTRAMUSCULAR | Status: DC | PRN
  Administered 2018-09-23: 5000 [IU] via INTRAVENOUS

## 2018-09-23 MED ORDER — SODIUM CHLORIDE 0.9 % IV BOLUS
500.0000 mL | Freq: Once | INTRAVENOUS | Status: AC
  Administered 2018-09-23: 500 mL via INTRAVENOUS

## 2018-09-23 MED ORDER — HYDROCODONE-ACETAMINOPHEN 5-325 MG PO TABS
1.0000 | ORAL_TABLET | ORAL | Status: DC | PRN
  Administered 2018-09-23 (×2): 1 via ORAL
  Filled 2018-09-23 (×2): qty 1

## 2018-09-23 MED ORDER — BUSPIRONE HCL 5 MG PO TABS
5.0000 mg | ORAL_TABLET | Freq: Every day | ORAL | Status: DC | PRN
  Filled 2018-09-23: qty 1

## 2018-09-23 MED ORDER — INSULIN ASPART 100 UNIT/ML ~~LOC~~ SOLN
0.0000 [IU] | Freq: Three times a day (TID) | SUBCUTANEOUS | Status: DC
  Administered 2018-09-23: 2 [IU] via SUBCUTANEOUS
  Administered 2018-09-24: 3 [IU] via SUBCUTANEOUS
  Administered 2018-09-24: 2 [IU] via SUBCUTANEOUS
  Administered 2018-09-24: 3 [IU] via SUBCUTANEOUS
  Administered 2018-09-25: 2 [IU] via SUBCUTANEOUS
  Administered 2018-09-25: 3 [IU] via SUBCUTANEOUS
  Administered 2018-09-26: 2 [IU] via SUBCUTANEOUS
  Administered 2018-09-26: 3 [IU] via SUBCUTANEOUS
  Filled 2018-09-23 (×7): qty 1

## 2018-09-23 MED ORDER — INSULIN ASPART 100 UNIT/ML ~~LOC~~ SOLN: 0.0000 [IU] | Freq: Three times a day (TID) | SUBCUTANEOUS | Status: DC

## 2018-09-23 MED ORDER — ACETAMINOPHEN 325 MG PO TABS: 650.0000 mg | ORAL_TABLET | Freq: Four times a day (QID) | ORAL | Status: DC | PRN

## 2018-09-23 MED ORDER — ASPIRIN EC 81 MG PO TBEC
81.0000 mg | DELAYED_RELEASE_TABLET | Freq: Every day | ORAL | Status: DC
  Administered 2018-09-23 – 2018-09-26 (×4): 81 mg via ORAL
  Filled 2018-09-23 (×5): qty 1

## 2018-09-23 MED ORDER — ACETAMINOPHEN 650 MG RE SUPP: 650.0000 mg | Freq: Four times a day (QID) | RECTAL | Status: DC | PRN

## 2018-09-23 MED ORDER — MIDAZOLAM HCL 2 MG/2ML IJ SOLN
INTRAMUSCULAR | Status: DC | PRN
  Administered 2018-09-23 (×2): 2 mg via INTRAVENOUS

## 2018-09-23 MED ORDER — HYDROCHLOROTHIAZIDE 25 MG PO TABS
25.0000 mg | ORAL_TABLET | Freq: Every day | ORAL | Status: DC
  Administered 2018-09-23 – 2018-09-25 (×3): 25 mg via ORAL
  Filled 2018-09-23 (×4): qty 1

## 2018-09-23 MED ORDER — HEPARIN (PORCINE) IN NACL 1000-0.9 UT/500ML-% IV SOLN
INTRAVENOUS | Status: AC
  Filled 2018-09-23: qty 1000

## 2018-09-23 MED ORDER — FENTANYL CITRATE (PF) 100 MCG/2ML IJ SOLN
INTRAMUSCULAR | Status: DC | PRN
  Administered 2018-09-23 (×2): 50 ug via INTRAVENOUS

## 2018-09-23 MED ORDER — INSULIN ASPART 100 UNIT/ML ~~LOC~~ SOLN
0.0000 [IU] | Freq: Every day | SUBCUTANEOUS | Status: DC
  Administered 2018-09-25: 2 [IU] via SUBCUTANEOUS
  Filled 2018-09-23 (×2): qty 1

## 2018-09-23 MED ORDER — TORSEMIDE 20 MG PO TABS
20.0000 mg | ORAL_TABLET | Freq: Every day | ORAL | Status: DC
  Administered 2018-09-23 – 2018-09-25 (×3): 20 mg via ORAL
  Filled 2018-09-23 (×3): qty 1

## 2018-09-23 MED ORDER — MIDAZOLAM HCL 2 MG/ML PO SYRP: 8.0000 mg | ORAL_SOLUTION | Freq: Once | ORAL | Status: DC | PRN

## 2018-09-23 MED ORDER — LIDOCAINE-EPINEPHRINE (PF) 1 %-1:200000 IJ SOLN
INTRAMUSCULAR | Status: AC
  Filled 2018-09-23: qty 30

## 2018-09-23 MED ORDER — GABAPENTIN 100 MG PO CAPS
100.0000 mg | ORAL_CAPSULE | Freq: Every day | ORAL | Status: DC
  Administered 2018-09-23 – 2018-09-25 (×3): 100 mg via ORAL
  Filled 2018-09-23 (×3): qty 1

## 2018-09-23 MED ORDER — HYDROXYZINE HCL 10 MG PO TABS
10.0000 mg | ORAL_TABLET | Freq: Three times a day (TID) | ORAL | Status: DC | PRN
  Filled 2018-09-23: qty 1

## 2018-09-23 MED ORDER — IODIXANOL 320 MG/ML IV SOLN
INTRAVENOUS | Status: DC | PRN
  Administered 2018-09-23: 45 mL via INTRA_ARTERIAL

## 2018-09-23 MED ORDER — MECLIZINE HCL 25 MG PO TABS
25.0000 mg | ORAL_TABLET | Freq: Every day | ORAL | Status: DC | PRN
  Filled 2018-09-23: qty 1

## 2018-09-23 MED ORDER — SODIUM CHLORIDE 0.9 % IV SOLN
INTRAVENOUS | Status: DC
  Administered 2018-09-23: 11:00:00 via INTRAVENOUS

## 2018-09-23 MED ORDER — CLOPIDOGREL BISULFATE 75 MG PO TABS
75.0000 mg | ORAL_TABLET | Freq: Every day | ORAL | Status: DC
  Administered 2018-09-23 – 2018-09-26 (×4): 75 mg via ORAL
  Filled 2018-09-23 (×5): qty 1

## 2018-09-23 MED ORDER — GLIMEPIRIDE 4 MG PO TABS
4.0000 mg | ORAL_TABLET | Freq: Every day | ORAL | Status: DC
  Administered 2018-09-24 – 2018-09-26 (×3): 4 mg via ORAL
  Filled 2018-09-23 (×3): qty 1

## 2018-09-23 MED ORDER — FENTANYL CITRATE (PF) 100 MCG/2ML IJ SOLN
INTRAMUSCULAR | Status: AC
  Filled 2018-09-23: qty 2

## 2018-09-23 MED ORDER — DIPHENHYDRAMINE HCL 50 MG/ML IJ SOLN: 50.0000 mg | Freq: Once | INTRAMUSCULAR | Status: DC | PRN

## 2018-09-23 MED ORDER — INSULIN ASPART 100 UNIT/ML ~~LOC~~ SOLN: 0.0000 [IU] | Freq: Every day | SUBCUTANEOUS | Status: DC

## 2018-09-23 MED ORDER — ONDANSETRON HCL 4 MG PO TABS: 4.0000 mg | ORAL_TABLET | Freq: Four times a day (QID) | ORAL | Status: DC | PRN

## 2018-09-23 MED ORDER — NITROGLYCERIN 0.4 MG SL SUBL: 0.4000 mg | SUBLINGUAL_TABLET | SUBLINGUAL | Status: DC | PRN

## 2018-09-23 MED ORDER — TAMSULOSIN HCL 0.4 MG PO CAPS
0.4000 mg | ORAL_CAPSULE | Freq: Every day | ORAL | Status: DC
  Administered 2018-09-24 – 2018-09-26 (×3): 0.4 mg via ORAL
  Filled 2018-09-23 (×5): qty 1

## 2018-09-23 MED ORDER — MIDAZOLAM HCL 5 MG/5ML IJ SOLN
INTRAMUSCULAR | Status: AC
  Filled 2018-09-23: qty 5

## 2018-09-23 MED ORDER — TRAMADOL HCL 50 MG PO TABS: 100.0000 mg | ORAL_TABLET | Freq: Four times a day (QID) | ORAL | Status: DC | PRN

## 2018-09-23 SURGICAL SUPPLY — 18 items
BALLN LUTONIX 018 5X60X130 (BALLOONS) ×2
BALLOON LUTONIX 018 5X60X130 (BALLOONS) IMPLANT
CATH BEACON 5 .035 65 RIM TIP (CATHETERS) ×1 IMPLANT
CATH BEACON 5 .038 100 VERT TP (CATHETERS) ×1 IMPLANT
CATH CXI 4F 90 DAV (CATHETERS) ×1 IMPLANT
CATH CXI SUPP ANG 4FR 135 (CATHETERS) IMPLANT
CATH CXI SUPP ANG 4FR 135CM (CATHETERS) ×2
CATH IMAGER II S 5FR 65CM (CATHETERS) ×1 IMPLANT
DEVICE PRESTO INFLATION (MISCELLANEOUS) ×1 IMPLANT
DEVICE STARCLOSE SE CLOSURE (Vascular Products) ×1 IMPLANT
DEVICE TORQUE .025-.038 (MISCELLANEOUS) ×1 IMPLANT
GLIDEWIRE ADV .035X260CM (WIRE) ×1 IMPLANT
GUIDEWIRE PFTE-COATED .018X300 (WIRE) ×1 IMPLANT
PACK ANGIOGRAPHY (CUSTOM PROCEDURE TRAY) ×2 IMPLANT
SHEATH ANL2 6FRX45 HC (SHEATH) ×1 IMPLANT
SHEATH BRITE TIP 5FRX11 (SHEATH) ×1 IMPLANT
TUBING CONTRAST HIGH PRESS 72 (TUBING) ×1 IMPLANT
WIRE J 3MM .035X145CM (WIRE) ×1 IMPLANT

## 2018-09-23 NOTE — Progress Notes (Signed)
Patient here for lower extremity angiogram.  Patient states his transportation method is [employer]  And he lives alone and has no one to stay with him at his home tonight after his angiogram.  I notified Dr. Lucky Cowboy of the above and attempted to contact mr. Kaley's 2 contact persons without success.  I left a message for his daughter to return my call.  Will contact social work regarding patient's overnight admission

## 2018-09-23 NOTE — Progress Notes (Signed)
Discussed right thigh swelling with Dr. Lucky Cowboy. Coban wrapped around right thigh as pressure dressing. DP pulse dopperable after wrapping. CBC ordered at 5pm

## 2018-09-23 NOTE — Consult Note (Addendum)
Reason for Consult:  Medical management Referring Physician:  Dr.Dew   Randy Howard is an 78 y.o. male.  HPI: Mr. Randy Howard is a 78 year old male came for special procedure and had a left femoral artery ultrasound-guided vascular access and a drug-coated angioplasty balloon by Dr. dew and subsequently he has developed hematoma in the right side.  Coban wrapped around the right thigh and patient is admitted for overnight observation and the hospitalist team is consulted for medical management given his multiple medical problems of diabetes mellitus, hypertension, chronic kidney disease stage III, peripheral vascular disease.  During my examination patient is having some discomfort on the right groin area from the newly developed a hematoma but otherwise resting fine.  Past Medical History:  Diagnosis Date  . Arthritis   . Coronary artery disease   . Elevated lipids   . Hyperlipidemia   . Hypertension   . Neuropathy   . Nocturia   . Obstructive sleep apnea   . Peripheral vascular disease ([place])   . Prostate enlargement   . PVD (peripheral vascular disease) ([place])   . Right carotid bruit   . Skin cancer    Followed by Dr. Evorn Gong  . Skin cancer   . Tremor     Past Surgical History:  Procedure Laterality Date  . CARDIAC CATHETERIZATION    . CAROTID ARTERY ANGIOPLASTY Left 09/30/2011  . CORONARY ARTERY BYPASS GRAFT  2006  . ENDARTERECTOMY FEMORAL Bilateral 11/01/2014   Procedure: ENDARTERECTOMY FEMORAL;  Surgeon: Algernon Huxley, MD;  Location: [facility];  Service: Vascular;  Laterality: Bilateral;  . LOWER EXTREMITY ANGIOGRAPHY Left 04/26/2018   Procedure: LOWER EXTREMITY ANGIOGRAPHY;  Surgeon: Algernon Huxley, MD;  Location: [facility];  Service: Cardiovascular;  Laterality: Left;  . PERIPHERAL VASCULAR CATHETERIZATION Left 10/09/2014   Procedure: Lower Extremity Angiography;  Surgeon: Algernon Huxley, MD;  Location: [place] [facility];  Service: Cardiovascular;  Laterality: Left;  .  PERIPHERAL VASCULAR CATHETERIZATION  10/09/2014   Procedure: Lower Extremity Intervention;  Surgeon: Algernon Huxley, MD;  Location: [facility];  Service: Cardiovascular;;    Family History  Problem Relation Age of Onset  . Heart disease Sister   . Heart disease Sister   . Diabetes Sister   . Diabetes Sister   . Diabetes Brother     Social History:  reports that he quit smoking about 33 years ago. His smoking use included cigarettes. He has a 25.00 pack-year smoking history. He has never used smokeless tobacco. He reports that he does not drink alcohol or use drugs.  Allergies:  Allergies  Allergen Reactions  . Oxycodone Nausea Only    Medications: I have reviewed the patient's current medications.  Results for orders placed or performed during the hospital encounter of 09/23/18 (from the past 48 hour(s))  Glucose, capillary     Status: Abnormal   Collection Time: 09/23/18 10:19 AM  Result Value Ref Range   Glucose-Capillary 143 (H) 70 - 99 mg/dL  Glucose, capillary     Status: Abnormal   Collection Time: 09/23/18 12:09 PM  Result Value Ref Range   Glucose-Capillary 146 (H) 70 - 99 mg/dL    No results found.  ROS:  CONSTITUTIONAL: Denies fevers, chills. Denies any fatigue, weakness.  EYES: Denies blurry vision, double vision, eye pain. EARS, NOSE, THROAT: Denies tinnitus, ear pain, hearing loss. RESPIRATORY: Denies cough, wheeze, shortness of breath.  CARDIOVASCULAR: Denies chest pain, palpitations, edema.  GASTROINTESTINAL: Denies nausea, vomiting,  diarrhea, abdominal pain. Denies bright red blood per rectum. GENITOURINARY: Denies dysuria, hematuria. ENDOCRINE: Denies nocturia or thyroid problems. HEMATOLOGIC AND LYMPHATIC: Denies easy bruising or bleeding. SKIN: Denies rash or lesion. MUSCULOSKELETAL: Right groin discomfort, denies pain in neck, back, shoulder, knees, hips or arthritic symptoms.  NEUROLOGIC: Denies paralysis, paresthesias.  PSYCHIATRIC: Denies  anxiety or depressive symptoms. Blood pressure (!) 149/63, pulse 74, temperature 97.7 F (36.5 C), temperature source Oral, resp. rate (!) 21, height 5\' 11"  (1.803 m), weight 68 kg, SpO2 99 %.   PHYSICAL EXAMINATION:  GENERAL: currently in no acute distress.  HEAD: Normocephalic, atraumatic.  EYES: Pupils equal, round, and reactive to light. Extraocular muscles intact. No scleral icterus.  MOUTH: Moist mucosal membranes. Dentition intact. No abscess noted. EARS, NOSE, THROAT: Clear without exudates. No external lesions.  NECK: Supple. No thyromegaly. No nodules. No JVD.  PULMONARY: Clear to auscultation bilaterally without wheezes, rales, or rhonchi. No use of accessory muscles. Good respiratory effort. CHEST: Nontender to palpation.  CARDIOVASCULAR: S1, S2, regular rate and rhythm. No murmurs, rubs, or gallops.  GASTROINTESTINAL: Soft, nontender, nondistended. No masses. Positive bowel sounds. 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 No gross focal neurological deficits. Sensation intact. Reflexes intact. SKIN: No ulcerations, lesions, rash, cyanosis. Skin warm, dry. Turgor intact. PSYCHIATRIC: Mood, affect within normal limits. Patient awake, alert, oriented x 3. Insight and judgment intact.   Assessment/Plan:  #Right groin hematoma following drug-coated angioplasty balloon placement Pain management as needed Management per attending pain COBAN wrapped  #Chronic nonhealing  Lt foot ulcer probably from underlying peripheral vascular disease-wound culture and sensitivity done but not considering antibiotics at this time  #Diabetes mellitus -sliding scale, continue home medication Amaryl  #Essential hypertension continue home medication hydrochlorothiazide and benazepril and continue close monitoring of the labs  #Chronic kidney disease  stage III-monitor renal functions and avoid nephrotoxins Baseline creatinine seems to be at 1.5-1.6  #Diabetic neuropathy-Neurontin will be continued   #Hyperlipidemia continue Lipitor his home medication  Thank you for consulting hospitalist team to take care of this patient   TOTAL TIME TAKING CARE OF THIS PATIENT: 45  minutes.   Note: This dictation was prepared with Dragon dictation along with smaller phrase technology. Any transcriptional errors that result from this process are unintentional.   @[facility] @ Pager - [phone] 09/23/2018, 3:14 PM

## 2018-09-23 NOTE — Progress Notes (Addendum)
Vascular service notified: +3 pulse on left foot and its warm, no pulse on Right  foot and its cold.

## 2018-09-23 NOTE — Op Note (Signed)
[facility]  Percutaneous Study/Intervention Procedural Note   Date of Surgery: 09/23/2018  Surgeon(s):,    Assistants:none  Pre-operative Diagnosis: PAD with pain right leg  Post-operative diagnosis:  Same  Procedure(s) Performed:             1.  Ultrasound guidance for vascular access left femoral artery             2.  Catheter placement into right SFA from left femoral approach             3.  Aortogram and selective right lower extremity angiogram             4.  Percutaneous transluminal angioplasty of right profunda femoris artery with 5 mm diameter by 6 cm length Lutonix drug-coated angioplasty balloon             5.  StarClose closure device left femoral artery  EBL: 10 cc  Contrast: 45 cc  Fluoro Time: 29.8 minutes  Moderate Conscious Sedation Time: approximately 45 minutes using 4 mg of Versed and 100 Mcg of Fentanyl              Indications:  Patient is a 78 y.o.male with a long history of severe peripheral arterial disease now with worsening pain in his right foot. The patient has noninvasive study showing a drop in his ABI into the severely reduced range. The patient is brought in for angiography for further evaluation and potential treatment.  Due to the limb threatening nature of the situation, angiogram was performed for attempted limb salvage. The patient is aware that if the procedure fails, amputation would be expected.  The patient also understands that even with successful revascularization, amputation may still be required due to the severity of the situation.  Risks and benefits are discussed and informed consent is obtained.   Procedure:  The patient was identified and appropriate procedural time out was performed.  The patient was then placed supine on the table and prepped and draped in the usual sterile fashion. Moderate conscious sedation was administered during a face to face encounter with the patient throughout the  procedure with my supervision of the RN administering medicines and monitoring the patient's vital signs, pulse oximetry, telemetry and mental status throughout from the start of the procedure until the patient was taken to the recovery room. Ultrasound was used to evaluate the left common femoral artery.  It was patent .  A digital ultrasound image was acquired.  A Seldinger needle was used to access the left common femoral artery under direct ultrasound guidance and a permanent image was performed.  A 0.035 J wire was advanced without resistance and a 5Fr sheath was placed.  Pigtail catheter was placed into the aorta and an AP aortogram was performed. This demonstrated strongly tortuous and calcified iliac arteries without obvious focal stenosis of greater than 30%.  The aorta was ectatic and calcified but not stenotic.  The renal arteries were not particularly well seen which may have been due to catheter position but no obvious renal artery stenosis was present. I then crossed the aortic bifurcation and advanced to the right femoral head. Selective right lower extremity angiogram was then performed. This demonstrated a patent right common femoral endarterectomy.  The SFA occluded about 10 cm beyond its origin and was extremely calcified.  Distal reconstitution was fairly poor with advancement of the catheter into the proximal SFA distal imaging demonstrated a long segment right SFA occlusion which reconstituted  around Hunter's canal.  There was then apparent occlusion of the tibioperoneal trunk and the proximal peroneal artery with the peroneal artery being the only runoff vessel seen distally although opacification was quite poor.  In addition, there was a 80 to 90% stenosis of the profunda femoris artery 1 to 2 cm beyond its origin below the endarterectomy site.  This was significantly limiting his distal collateralization. It was felt that it was in the patient's best interest to proceed with intervention  after these images to avoid a second procedure and a larger amount of contrast and fluoroscopy based off of the findings from the initial angiogram. The patient was systemically heparinized and a 6 [place] Ansell sheath was then placed over the [employer] wire. I then used a Kumpe catheter and the advantage wire to navigate down into the SFA occlusion.  This was an extremely calcified vessel and despite multiple attempts with an advantage wire and a Kumpe as well as a CXI catheter, I was never able to cross the occlusion.  A wire could be gotten down into the below-knee popliteal artery but this was in a subintimal plane and I could never get catheter support to get back into a true lumen.  After over 20 minutes of fluoroscopy it was clear this was going to be an unsuccessful endeavor as multiple subintimal channels had been created.  I then pulled the catheter back and elected to treat the profunda femoris artery.  A very steep angle made this quite difficult to cross as well.  Ultimately, using the rim catheter and a 0.018 advantage wire I was able to cross the high-grade stenosis in the profunda femoris artery.  The catheter was removed and I treated the profunda femoris artery with a 5 mm diameter by 6 cm length Lutonix drug-coated angioplasty balloon inflated to 10 atm for 1 minute.  Completion imaging showed a marked improvement with less than 10% residual stenosis in the right profunda femoris artery.  If this does not improve his rest pain, he could be brought back for repeat attempt at crossing the long SFA and tibial occlusions potentially with an atherectomy device or other means. I elected to terminate the procedure. The sheath was removed and StarClose closure device was deployed in the left femoral artery with excellent hemostatic result. The patient was taken to the recovery room in stable condition having tolerated the procedure well.  Findings:               Aortogram:  This demonstrated  strongly tortuous and calcified iliac arteries without obvious focal stenosis of greater than 30%.  The aorta was ectatic and calcified but not stenotic.  The renal arteries were not particularly well seen which may have been due to catheter position but no obvious renal artery stenosis was present             Right Lower Extremity:  Patent right common femoral endarterectomy.  The SFA occluded about 10 cm beyond its origin and was extremely calcified.  Distal reconstitution was fairly poor with advancement of the catheter into the proximal SFA distal imaging demonstrated a long segment right SFA occlusion which reconstituted around Hunter's canal.  There was then apparent occlusion of the tibioperoneal trunk and the proximal peroneal artery with the peroneal artery being the only runoff vessel seen distally although opacification was quite poor.  In addition, there was a 80 to 90% stenosis of the profunda femoris artery 1 to 2 cm beyond its origin below  the endarterectomy site.  This was significantly limiting his distal collateralization   Disposition: Patient was taken to the recovery room in stable condition having tolerated the procedure well.  Complications: None  Leotis Pain 09/23/2018 11:54 AM   This note was created with Dragon Medical transcription system. Any errors in dictation are purely unintentional.

## 2018-09-23 NOTE — H&P (Signed)
[facility] History & Physical Update  The patient was interviewed and re-examined.  The patient's previous History and Physical has been reviewed and is unchanged.  There is no change in the plan of care. We plan to proceed with the scheduled procedure.  Leotis Pain, MD  09/23/2018, 9:08 AM

## 2018-09-23 NOTE — Care Management Obs Status (Signed)
MEDICARE OBSERVATION STATUS NOTIFICATION   Patient Details  Name: Randy Howard MRN: [id number] Date of Birth: 1940/12/16   Medicare Observation Status Notification Given:  Yes    Tommy Medal 09/23/2018, 4:13 PM

## 2018-09-23 NOTE — Progress Notes (Signed)
Spoke with patient's daughter who is in [place] and she states her and her brother have been trying to get the patient to move closer to them so they can assist him with his healthcare needs but patient refuses.  I spoke with angela johnson in care management regarding these concerns for patient's care and safety.  She suggested a care management and pt consult with observation stay .  Patient also verbalizes need for assistance at home with his care.  Notified dr. Lucky Cowboy and kim stegmayer pa-c of the above information

## 2018-09-23 NOTE — Plan of Care (Signed)
The patient has been stable. Pain managed with PRN pain meds. PT/OT order as the patient has complained of pain on the R thigh . WOCN consulted for ulcer on sole of L foot. Vascular notified of the patient's absent pulse on R foot and cold.  Problem: Education: Goal: Knowledge of General Education information will improve Description: Including pain rating scale, medication(s)/side effects and non-pharmacologic comfort measures Outcome: Progressing   Problem: Health Behavior/Discharge Planning: Goal: Ability to manage health-related needs will improve Outcome: Progressing   Problem: Clinical Measurements: Goal: Ability to maintain clinical measurements within normal limits will improve Outcome: Progressing Goal: Will remain free from infection Outcome: Progressing Goal: Diagnostic test results will improve Outcome: Progressing Goal: Respiratory complications will improve Outcome: Progressing Goal: Cardiovascular complication will be avoided Outcome: Progressing   Problem: Activity: Goal: Risk for activity intolerance will decrease Outcome: Progressing   Problem: Nutrition: Goal: Adequate nutrition will be maintained Outcome: Progressing   Problem: Coping: Goal: Level of anxiety will decrease Outcome: Progressing   Problem: Elimination: Goal: Will not experience complications related to bowel motility Outcome: Progressing Goal: Will not experience complications related to urinary retention Outcome: Progressing   Problem: Pain Managment: Goal: General experience of comfort will improve Outcome: Progressing   Problem: Safety: Goal: Ability to remain free from injury will improve Outcome: Progressing   Problem: Skin Integrity: Goal: Risk for impaired skin integrity will decrease Outcome: Progressing

## 2018-09-23 NOTE — Progress Notes (Addendum)
Pt sat up right at 1315. Pt complains of new swelling and pain in his right thigh. Right thigh mildly swollen, tender to touch and warm. Pt does have a baseline hematoma in right groin. Page sent to Dr. Lucky Cowboy and Kaleen Mask PA. Call back received from K. Stegmeyer PA updated on Pt status, request clinician to assess site. No interventions at this time. PA to notify Dr. Lucky Cowboy .

## 2018-09-23 NOTE — Progress Notes (Signed)
PT Cancellation Note  Patient Details Name: KEY CEN MRN: [id number] DOB: Jun 09, 1940   Cancelled Treatment:    Reason Eval/Treat Not Completed: Patient at procedure or test/unavailable(Consult received and chart reviewed.  Patient currently off unit for diagnostic procedure.  Will re-attempt at later time/date as medically appropriate and available.)   Adysson Revelle H. Owens Shark, PT, DPT, NCS 09/23/18, 1:22 PM [phone]

## 2018-09-23 NOTE — Progress Notes (Signed)
Kim Stegmeyer P.A at bedside. Right thigh assessed, unchanged at this time. No interventions at this time. P.A will update MD on assessment.

## 2018-09-24 DIAGNOSIS — I70229 Atherosclerosis of native arteries of extremities with rest pain, unspecified extremity: Secondary | ICD-10-CM

## 2018-09-24 LAB — BASIC METABOLIC PANEL
Anion gap: 8 (ref 5–15)
BUN: 33 mg/dL — ABNORMAL HIGH (ref 8–23)
CO2: 30 mmol/L (ref 22–32)
Calcium: 8.6 mg/dL — ABNORMAL LOW (ref 8.9–10.3)
Chloride: 99 mmol/L (ref 98–111)
Creatinine, Ser: 1.58 mg/dL — ABNORMAL HIGH (ref 0.61–1.24)
GFR calc Af Amer: 48 mL/min — ABNORMAL LOW (ref >60)
GFR calc non Af Amer: 41 mL/min — ABNORMAL LOW (ref >60)
Glucose, Bld: 312 mg/dL — ABNORMAL HIGH (ref 70–99)
Potassium: 4 mmol/L (ref 3.5–5.1)
Sodium: 137 mmol/L (ref 135–145)

## 2018-09-24 LAB — CBC
HCT: 31.4 % — ABNORMAL LOW (ref 39.0–52.0)
Hemoglobin: 10.3 g/dL — ABNORMAL LOW (ref 13.0–17.0)
MCH: 29.6 pg (ref 26.0–34.0)
MCHC: 32.8 g/dL (ref 30.0–36.0)
MCV: 90.2 fL (ref 80.0–100.0)
Platelets: 105 10*3/uL — ABNORMAL LOW (ref 150–400)
RBC: 3.48 MIL/uL — ABNORMAL LOW (ref 4.22–5.81)
RDW: 13.2 % (ref 11.5–15.5)
WBC: 5.4 10*3/uL (ref 4.0–10.5)
nRBC: 0 % (ref 0.0–0.2)

## 2018-09-24 LAB — MAGNESIUM: Magnesium: 2 mg/dL (ref 1.7–2.4)

## 2018-09-24 MED ORDER — VITAMIN C 500 MG PO TABS
250.0000 mg | ORAL_TABLET | Freq: Two times a day (BID) | ORAL | Status: DC
  Administered 2018-09-24 – 2018-09-26 (×4): 250 mg via ORAL
  Filled 2018-09-24 (×4): qty 1

## 2018-09-24 MED ORDER — NEPRO/CARBSTEADY PO LIQD
237.0000 mL | Freq: Two times a day (BID) | ORAL | Status: DC
  Administered 2018-09-24 – 2018-09-26 (×5): 237 mL via ORAL

## 2018-09-24 MED ORDER — OCUVITE-LUTEIN PO CAPS
1.0000 | ORAL_CAPSULE | Freq: Every day | ORAL | Status: DC
  Administered 2018-09-25 – 2018-09-26 (×2): 1 via ORAL
  Filled 2018-09-24 (×2): qty 1

## 2018-09-24 NOTE — Evaluation (Signed)
Occupational Therapy Evaluation Patient Details Name: Randy Howard MRN: [id number] DOB: [birth year] Today's Date: 09/24/2018    History of Present Illness Per MD note: Pt is a 78 y.o.male with a long history of severe peripheral arterial disease now with worsening pain in his right foot. The patient has noninvasive study showing a drop in his ABI into the severely reduced range. The patient is brought in for angiography for further evaluation and potential treatment.   Pt is s/p RLE revascularization.  PMH includes: Chronic L foot wound, DM, HTN, CKD, atherosclerosis, CAD, HLD, neuropathy, PVD, and skin CA.   Clinical Impression   Pt seen for OT evaluation this date, POD#1 from above surgery. Pt was independent in all ADLs prior to surgery, however occasionally using a homemade walking stick as SPC for mobility. Pt is eager to return to PLOF with less pain and improved safety and independence. Pt currently requires CGA assist for LB dressing while in seated position due to pain and limited AROM of R leg. Pt instructed in falls prevention strategies, home/routines modifications, DME/AE for LB bathing and dressing tasks, and safe use of AE for mobility. Pt would benefit from skilled OT services including additional instruction in dressing techniques with or without assistive devices for dressing and bathing skills to support recall and carryover prior to discharge and ultimately to maximize safety, independence, and minimize falls risk and caregiver burden. Upon hospital DC, recommend HHOT to support safety and functional independence during daily routines/meaningful occupations of life.       Follow Up Recommendations  Home health OT    Equipment Recommendations  3 in 1 bedside commode    Recommendations for Other Services       Precautions / Restrictions Precautions Precautions: Fall Required Braces or Orthoses: (Per pt, he is to wear L post-op shoe when weight  bearing.) Restrictions Weight Bearing Restrictions: No Other Position/Activity Restrictions: No WB restrictions noted in chart review with pt arriving at the hospital with a post-op shoe he wears on the LLE secondary to a chronic L foot wound.      Mobility Bed Mobility Overal bed mobility: Independent             General bed mobility comments: Good speed and body control with bed mobility tasks  Transfers Overall transfer level: Needs assistance Equipment used: Rolling walker (2 wheeled) Transfers: Sit to/from Stand Sit to Stand: Supervision         General transfer comment: Pt ambulated ~200 feet around nurses station with OT on this date. No complaints of dizziness/nausea with mobility. Cues for safety.    Balance Overall balance assessment: Needs assistance Sitting-balance support: Feet supported Sitting balance-Leahy Scale: Good     Standing balance support: Bilateral upper extremity supported;Single extremity supported Standing balance-Leahy Scale: Fair Standing balance comment: At times appeared to lose balance/lean backward without UE support, but was able to self-correct with min VCs. OT provided CGA during all functional mobility for pt safety.                           ADL either performed or assessed with clinical judgement   ADL Overall ADL's : Needs assistance/impaired Eating/Feeding: Sitting;Independent   Grooming: Set up;Sitting;Supervision/safety   Upper Body Bathing: Sitting;Min guard;Cueing for safety   Lower Body Bathing: Min guard;Sit to/from stand;Cueing for safety   Upper Body Dressing : Independent;Sitting   Lower Body Dressing: Min guard;Sit to/from stand;Cueing for safety;With adaptive equipment Lower  Body Dressing Details (indicate cue type and reason): Pt able to don/doff sneaker and post op shoe with set-up to min guard assist on this date. Has difficulty crossing legs/brinking ankle to knee 2/2 increased pain and decreased  ROM in his RLE. Would beneift from AE to support independence and safety during LB ADL tasks. Toilet Transfer: RW;Min Designer, multimedia and Hygiene: Sit to/from stand;Supervision/safety;Cueing for safety       Functional mobility during ADLs: Min guard;Supervision/safety;Cueing for safety;Rolling walker General ADL Comments: Pt at times having difficulty maintaining balance. CGA provided t/o functional mobility and transfers to ensure pt safety. Consistent cueing for safety t/o session as well as pt at times impulsive with movements.     Vision         Perception     Praxis      Pertinent Vitals/Pain Pain Assessment: 0-10 Pain Score: 7  Pain Location: RLE Pain Descriptors / Indicators: Sore;Burning;Guarding;Grimacing Pain Intervention(s): Limited activity within patient's tolerance;Monitored during session;Repositioned     Hand Dominance     Extremity/Trunk Assessment Upper Extremity Assessment Upper Extremity Assessment: Overall WFL for tasks assessed   Lower Extremity Assessment Lower Extremity Assessment: Generalized weakness;Defer to PT evaluation   Cervical / Trunk Assessment Cervical / Trunk Assessment: Kyphotic   Communication Communication Communication: No difficulties   Cognition Arousal/Alertness: Awake/alert Behavior During Therapy: WFL for tasks assessed/performed Overall Cognitive Status: Within Functional Limits for tasks assessed                                 General Comments: Pt impulsive requiring several cues for general safety during the session   General Comments  Dry peeling/flaking skin on BUE, pt states from prior edema and vascular difficulties. Post-op shoe utilized t/o session to ensure LLE wound protected.    Exercises  Other Exercises: Pt educated in falls prevention strategies including safe use of AE for mobility, use of appropriate footwear, and slowing of movements to ensure  stability and adequate BOS. Pt verbalized understanding, but would benefit from reinforcement. Other Exercises: Pt educated in [place] for LB ADL tasks. Would benefit from trail of AE to support foot care and LB dressing. Pt also educated on importance of regular foot checks and proper care of wounds at his feet. Verbalizes understanding.   Shoulder Instructions      Home Living Family/patient expects to be discharged to:: Private residence Living Arrangements: Alone Available Help at Discharge: Other (Comment)(No assistance available.) Type of Home: House Home Access: Stairs to enter [employer] of Steps: 4 Entrance Stairs-Rails: Right Home Layout: One level     Bathroom Shower/Tub: Teacher, early years/pre: Standard     Home Equipment: Cane - single point   Additional Comments: Kasandra Knudsen appears homemade walking-stick type.      Prior Functioning/Environment Level of Independence: Independent with assistive device(s)        Comments: Pt Mod Ind with amb with a SPC (walking stick) limited community distances, Ind with ADLs, > 10 falls in the last year.        OT Problem List: Decreased coordination;Pain;Impaired sensation;Decreased safety awareness;Decreased knowledge of use of DME or AE;Impaired balance (sitting and/or standing)      OT Treatment/Interventions: Self-care/ADL training;Balance training;Therapeutic exercise;Therapeutic activities;DME and/or AE instruction;Patient/family education    OT Goals(Current goals can be found in the care plan section) Acute Rehab OT Goals Patient Stated Goal: To get  back to playing [employer] and gardening. OT Goal Formulation: With patient Time For Goal Achievement: 10/08/18 Potential to Achieve Goals: Good ADL Goals Pt Will Perform Lower Body Dressing: with modified independence;with adaptive equipment;sit to/from stand(With LRAD PRN for improved safety and functional independence.) Pt Will Transfer to Toilet:  ambulating;regular height toilet;with modified independence(With LRAD PRN for improved safety and functional independence.) Pt Will Perform Toileting - Clothing Manipulation and hygiene: with modified independence;sit to/from stand;with adaptive equipment(With LRAD PRN for improved safety and functional independence.)  OT Frequency: Min 1X/week   Barriers to D/C:            Co-evaluation              AM-PAC OT "6 Clicks" Daily Activity     Outcome Measure Help from another person eating meals?: None Help from another person taking care of personal grooming?: A Little Help from another person toileting, which includes using toliet, bedpan, or urinal?: A Little Help from another person bathing (including washing, rinsing, drying)?: A Little Help from another person to put on and taking off regular upper body clothing?: A Little Help from another person to put on and taking off regular lower body clothing?: A Little 6 Click Score: 19   End of Session Equipment Utilized During Treatment: Gait belt;Rolling walker  Activity Tolerance: Patient tolerated treatment well Patient left: in bed;with call bell/phone within reach;with bed alarm set  OT Visit Diagnosis: Other abnormalities of gait and mobility (R26.89);Repeated falls (R29.6);Pain Pain - Right/Left: Right Pain - part of body: Hip;Knee;Leg                Time: 5056-9794 OT Time Calculation (min): 31 min Charges:  OT General Charges $OT Visit: 1 Visit OT Evaluation $OT Eval Low Complexity: 1 Low OT Treatments $Self Care/Home Management : 23-37 mins  Shara Blazing, M.S., OTR/L Ascom: [phone] 09/24/18, 3:38 PM

## 2018-09-24 NOTE — Progress Notes (Addendum)
    Subjective  - POD #1, s/p PTA PFA, failed PTA to SFa  Pain in right thigh post procedure is improved   Physical Exam:  No acute ischemic changes to either foot Normal motor function in both feet       Assessment/Plan:  POD #1  Patient wants to go home and not to SNF I will see how he does with PT tomorrow.  Plans were for SNF, but he is refusing Appreciate medical management by hospital service Acute blood loss anemia stable.  HCT up this am from yesterday  Wells Brabham 09/24/2018 6:56 PM --  Vitals:   09/24/18 1009 09/24/18 1340  BP: (!) 119/52 (!) 103/50  Pulse: 67 (!) 55  Resp:  16  Temp:  97.6 F (36.4 C)  SpO2:  100%    Intake/Output Summary (Last 24 hours) at 09/24/2018 1856 Last data filed at 09/24/2018 1711 Gross per 24 hour  Intake 480 ml  Output 2000 ml  Net -1520 ml     Laboratory CBC    Component Value Date/Time   WBC 5.4 09/24/2018 0454   HGB 10.3 (L) 09/24/2018 0454   HCT 31.4 (L) 09/24/2018 0454   PLT 105 (L) 09/24/2018 0454    BMET    Component Value Date/Time   NA 137 09/24/2018 0454   NA 142 07/30/2013   NA 141 01/05/2012 0724   K 4.0 09/24/2018 0454   K 3.8 01/05/2012 0724   CL 99 09/24/2018 0454   CL 106 01/05/2012 0724   CO2 30 09/24/2018 0454   CO2 28 01/05/2012 0724   GLUCOSE 312 (H) 09/24/2018 0454   GLUCOSE 152 (H) 01/05/2012 0724   BUN 33 (H) 09/24/2018 0454   BUN 15 07/30/2013   BUN 24 (H) 01/05/2012 0724   CREATININE 1.58 (H) 09/24/2018 0454   CREATININE 1.62 (H) 06/01/2017 0822   CALCIUM 8.6 (L) 09/24/2018 0454   CALCIUM 9.0 01/05/2012 0724   GFRNONAA 41 (L) 09/24/2018 0454   GFRNONAA 40 (L) 06/01/2017 0822   GFRAA 48 (L) 09/24/2018 0454   GFRAA 46 (L) 06/01/2017 0822    COAG Lab Results  Component Value Date   INR 1.21 10/24/2014   No results found for: PTT  Antibiotics Anti-infectives (From admission, onward)   Start     Dose/Rate Route Frequency Ordered Stop   09/22/18 2245  ceFAZolin (ANCEF)  IVPB 2g/100 mL premix     2 g 200 mL/hr over 30 Minutes Intravenous  Once 09/22/18 2237 09/23/18 1135       V. Leia Alf, M.D., [facility] [place] Office: [phone] Pager:  [phone]

## 2018-09-24 NOTE — TOC Initial Note (Signed)
Transition of Care [facility]) - Initial/Assessment Note    Patient Details  Name: Randy Howard MRN: [id number] Date of Birth: Apr 07, 1940  [facility]) CM/SW Contact:    Katrina Stack, RN Phone Number: 09/24/2018, 4:49 PM  Clinical Narrative:               Developed hematoma after outpatient vascular intervention. Placed in observation. Lives alone. Usually drives. No issues seeking medical care or obtaining medications. Family lives out of state. Anticipates having his friend transport home and if that falls through, says he can pay for a cab. Visits the senior center regularly. He is reluctant to accept home health but finally agrees to a referral. No agency preference. [place] for heads up PT and OT services. Agreeable to walker. Obtained from closet and placed in room.   Expected Discharge Plan: [place]     Patient Goals and CMS Choice Patient states their goals for this hospitalization and ongoing recovery are:: Go home CMS Medicare.gov Compare Post Acute Care list provided to:: Patient Choice offered to / list presented to : Patient  Expected Discharge Plan and Services Expected Discharge Plan: [place] In-house Referral: (Care management) Discharge Planning Services: CM Consult Post Acute Care Choice: [place] arrangements for the past 2 months: Single Family Home                   DME Agency: [employer] Date DME Agency Contacted: (NA) Time DME Agency Contacted: (NA- obtained walker from closet) Representative spoke with at DME Agency: (Email sent) [place] Arranged: PT, OT [place] Agency: [facility] [employer]), [place] ([place]) Date [place]: 09/24/18 Time [place]: [place] Representative spoke with at [place]: [place] Arrangements/Services Living arrangements for the past 2 months: [place] Lives with:: Self Patient  language and need for interpreter reviewed:: Yes Do you feel safe going back to the place where you live?: Yes      Need for Family Participation in Patient Care: No (Comment) Care giver support system in place?: Yes (comment) Current home services: Other (comment)(none) Criminal Activity/Legal Involvement Pertinent to Current Situation/Hospitalization: No - Comment as needed  Activities of Daily Living Home Assistive Devices/Equipment: Cane (specify quad or straight) ADL Screening (condition at time of admission) Patient's cognitive ability adequate to safely complete daily activities?: Yes Is the patient deaf or have difficulty hearing?: No Does the patient have difficulty seeing, even when wearing glasses/contacts?: No Does the patient have difficulty concentrating, remembering, or making decisions?: No Patient able to express need for assistance with ADLs?: Yes Does the patient have difficulty dressing or bathing?: No Independently performs ADLs?: Yes (appropriate for developmental age) Does the patient have difficulty walking or climbing stairs?: Yes Weakness of Legs: Both Weakness of Arms/Hands: Both  Permission Sought/Granted Permission sought to share information with : Case Manager Permission granted to share information with : Yes, Verbal Permission Granted     Permission granted to share info w AGENCY: [facility]        Emotional Assessment Appearance:: Appears stated age Attitude/Demeanor/Rapport: Engaged Affect (typically observed): Calm Orientation: : Oriented to Self, Oriented to Place, Oriented to  Time, Oriented to Situation Alcohol / Substance Use: Not Applicable Psych Involvement: No (comment)  Admission diagnosis:  Atherosclerotic vascular disease [I70.90] Patient Active Problem List   Diagnosis Date Noted  . Atherosclerotic vascular disease 09/23/2018  .  Atherosclerosis of native arteries of extremity with rest pain ([place]) 09/10/2018  . Swelling of limb  09/01/2017  . CKD (chronic kidney disease), stage III ([place]) 06/09/2017  . BPH (benign prostatic hyperplasia) 12/29/2016  . Insomnia 11/12/2016  . Osteoarthritis of multiple joints 07/28/2016  . Neuropathy 07/04/2016  . Early satiety 11/05/2015  . Weight loss 11/05/2015  . Benign head tremor 07/30/2015  . Tremor 06/04/2015  . Inguinal hernia 06/04/2015  . Anxiety disorder 03/07/2015  . CHF (congestive heart failure) ([place]) 12/26/2014  . Atherosclerosis of native arteries of the extremities with ulceration ([place]) 11/01/2014  . Hypertension 10/06/2014  . Elevated cholesterol 10/06/2014  . Chronic venous insufficiency 10/06/2014  . Peripheral vascular disease ([place]) 10/06/2014  . Difficulty in walking 08/18/2013  . Dizziness 08/18/2013  . Arteriosclerosis of coronary artery 08/16/2013  . Cardiac murmur 08/16/2013  . Breath shortness 08/16/2013  . Chest pain 08/16/2013  . Type 2 diabetes mellitus with diabetic polyneuropathy, without long-term current use of insulin ([place]) 08/16/2013   PCP:  Olin Hauser, DO Pharmacy:   [facility], [address] [place] [address] Phone: [phone] Fax: [phone]     Social Determinants of Health (SDOH) Interventions    Readmission Risk Interventions No flowsheet data found.

## 2018-09-24 NOTE — Progress Notes (Signed)
   09/24/18 0700  Clinical Encounter Type  Visited With Patient  Visit Type Initial  Referral From Nurse  Consult/Referral To Chaplain  Spiritual Encounters  Spiritual Needs Other (Comment)  Chaplain received OR for AD. Chaplain visit patient and educated him on the AD. Patient declined the AD, however, Chaplain is not sure if patient understood clearly. A follow up visit may be necessary.

## 2018-09-24 NOTE — Progress Notes (Signed)
Initial Nutrition Assessment  DOCUMENTATION CODES:   Not applicable  INTERVENTION:   Nepro Shake po BID, each supplement provides 425 kcal and 19 grams protein  Ocuvite daily for wound healing (provides zinc, vitamin A, vitamin C, Vitamin E, copper, and selenium)  Vitamin C 250mg  po BID  NUTRITION DIAGNOSIS:   Increased nutrient needs related to wound healing as evidenced by increased estimated needs.  GOAL:   Patient will meet greater than or equal to 90% of their needs  MONITOR:   PO intake, Supplement acceptance, Labs, Weight trends, Skin, I & O's  REASON FOR ASSESSMENT:   Consult Assessment of nutrition requirement/status  ASSESSMENT:   78 year old male came for special procedure and had a left femoral artery ultrasound-guided vascular access and a drug-coated angioplasty balloon by Dr. dew and subsequently he has developed hematoma in the right side  RD working remotely.  Unable to reach pt by phone. Pt with increased estimated needs r/t wound healing. Pt currently eating 100% of meals in hospital. RD will add supplements and vitamins to support wound healing. Per chart, pt with 10lb(7%) weight loss over the past month.    Medications reviewed and include: aspirin, plavix, glimepiride, heparin, insulin, remeron, torsemide   Labs reviewed: BUN 33(H), creat 1.58(H), Mg 2.0 wnl Hgb 10.3(L), Hgt 31.4(L) cbgs- 143, 146, 132, 136 x 24 hrs AIC 8.1(H)- 7/2  Unable to complete Nutrition-Focused physical exam at this time.   Diet Order:   Diet Order            Diet Carb Modified Fluid consistency: Thin; Room service appropriate? Yes  Diet effective now             EDUCATION NEEDS:   Not appropriate for education at this time  Skin:  Skin Assessment: Reviewed RN Assessment(L foot wound 0.3cm x 0.3cm x 0.2cm)  Last BM:  7/1  Height:   Ht Readings from Last 1 Encounters:  09/23/18 5\' 11"  (1.803 m)    Weight:   Wt Readings from Last 1 Encounters:   09/23/18 68 kg    Ideal Body Weight:  78 kg  BMI:  Body mass index is 20.92 kg/m.  Estimated Nutritional Needs:   Kcal:  1900-2200kcal/day  Protein:  95-110g/day  Fluid:  >2L/day  Koleen Distance MS, RD, LDN Pager #- [phone] Office#- [phone] After Hours Pager: [phone]

## 2018-09-24 NOTE — Plan of Care (Signed)
  Problem: Education: Goal: Knowledge of General Education information will improve Description: Including pain rating scale, medication(s)/side effects and non-pharmacologic comfort measures Outcome: Progressing   Problem: Pain Managment: Goal: General experience of comfort will improve Outcome: Progressing   

## 2018-09-24 NOTE — Consult Note (Signed)
[place] Nurse wound consult note Reason for Consult: neuropathic foot ulceration with complex PAD  Wound type: neuropathic Followed by podiatry and vascular Pressure Injury POA: NA Measurement: 0.3cm x 0.3cm x 0.2cm  Wound bed: 95% clean/5% yellow centrally  Drainage (amount, consistency, odor) minimal, no odor  Periwound:appears to have some loose skin around the wound, similar to a ruptured bulla. He reports he uses antibiotic ointment.  He can not reach the site. Denies the use of TCC for offloading.  Dressing procedure/placement/frequency: 1. Add silver hydrofiber to wound, cover with foam and dry dressing 2. Needs offloading   Discussed POC with patient and bedside nurse.  Re consult if needed, will not follow at this time. Thanks  Oreoluwa Gilmer [employer], RN,CWOCN, CNS, [place] [phone])

## 2018-09-24 NOTE — Progress Notes (Signed)
Ch completed a f/u visit with pt regarding AD education. Pt presented to be lucid as he was very conversational and able to remember exact dates related to major events in his life. Pt does live alone and is hoping to d/c soon to care for his 15 cats. Pt has family that live in [place] and [place] but no one locally. Pt is a diabetic that has issues with vertigo, keeping his insulin regulated, and a hernia that he states he has been dealing with for several years. Pt shared that he losses his balance often when he tries to go to the bathroom and has several bruises on both arms from his falls as he reported due to feeling weak. Ch was concerned about pt being able to care for himself since he shared that he no longer wants to cook and mainly eats in the morning (coffee with a pastry) and evening time can good items such as tuna. Pt may benefit from H-H and wound care if he is not a candidate for rehab or SNF.    Goal: f/u with care team to determine [place] for pt.   Pt does not want to assign HPOA but could benefit from completing a MOST form and determine code status.    09/24/18 0900  Clinical Encounter Type  Visited With Patient;Health care provider  Visit Type Follow-up;Psychological support;Spiritual support;Social support;Other (Comment) (AD education )  Referral From Chaplain  Consult/Referral To Chaplain  Recommendations consult w/ CM regarding TOC   Spiritual Encounters  Spiritual Needs Emotional;Grief support  Stress Factors  Patient Stress Factors Health changes;Family relationships;Loss of control;Major life changes  Family Stress Factors None identified  Advance Directives (For Healthcare)  Does Patient Have a Medical Advance Directive? No  Would patient like information on creating a medical advance directive? No - Patient declined

## 2018-09-24 NOTE — Evaluation (Signed)
Physical Therapy Evaluation Patient Details Name: Randy Howard MRN: [id number] DOB: 01/06/1941 Today's Date: 09/24/2018   History of Present Illness  Per MD note: Pt is a 78 y.o.male with a long history of severe peripheral arterial disease now with worsening pain in his right foot. The patient has noninvasive study showing a drop in his ABI into the severely reduced range. The patient is brought in for angiography for further evaluation and potential treatment.   Pt is s/p RLE revascularization.  PMH includes: Chronic L foot wound, DM, HTN, CKD, atherosclerosis, CAD, HLD, neuropathy, PVD, and skin CA.     Clinical Impression  Pt presents with deficits in strength, transfers, gait, balance, and activity tolerance.  Pt was Ind with bed mobility tasks and CGA with transfers.  Pt tolerated below therex well but after doing some minimal standing therex pt began to c/o dizziness.  Pt returned to sitting with BP taken at 119/52 and symptoms quickly resolved and did not return.  After amb around 5' pt began to c/o nausea and again returned to sitting and began vomiting.  Nursing called and session terminated.  Pt has an extensive history of falls over the last year that he says has improved somewhat since he began using a SPC several weeks ago.  Pt will benefit from HHPT services and supervision with activity upon discharge to safely address above deficits for decreased caregiver assistance and decreased fall risk.        Follow Up Recommendations Home health PT;Supervision for mobility/OOB    Equipment Recommendations  Rolling walker with 5" wheels    Recommendations for Other Services       Precautions / Restrictions Precautions Precautions: Fall Required Braces or Orthoses: (Per patient he is to wear a post-op shoe on the L foot with weight bearing.) Restrictions Weight Bearing Restrictions: No Other Position/Activity Restrictions: No WB restrictions noted in chart review with pt arriving at  the hospital with a post-op shoe he wears on the LLE secondary to a chronic L foot wound.      Mobility  Bed Mobility Overal bed mobility: Independent             General bed mobility comments: Good speed and body control with bed mobility tasks  Transfers Overall transfer level: Needs assistance Equipment used: Rolling walker (2 wheeled) Transfers: Sit to/from Stand Sit to Stand: Min guard         General transfer comment: Pt required verbal cues to prevent attempting to stand prior to shoes and gait belt being donned; pt with c/o dizziness upon standing with BP taken in sitting at 119/52; after BP taken pt had no other issues with dizziness during the session.  Ambulation/Gait Ambulation/Gait assistance: Min guard Gait Distance (Feet): 5 Feet Assistive device: Rolling walker (2 wheeled) Gait Pattern/deviations: Step-through pattern;Decreased step length - right;Decreased step length - left Gait velocity: decreased   General Gait Details: After initiating ambulation the pt became nauseous, returned to sitting, and began vomiting; nursing notified and session ended.  Stairs            Wheelchair Mobility    Modified Rankin (Stroke Patients Only)       Balance Overall balance assessment: Needs assistance Sitting-balance support: Feet supported Sitting balance-Leahy Scale: Good     Standing balance support: Bilateral upper extremity supported Standing balance-Leahy Scale: Fair  Pertinent Vitals/Pain Pain Assessment: No/denies pain    Home Living Family/patient expects to be discharged to:: Private residence Living Arrangements: Alone Available Help at Discharge: Other (Comment)(No assistance available) Type of Home: House Home Access: Stairs to enter Entrance Stairs-Rails: Right Entrance Stairs-Number of Steps: 4 Home Layout: One level Home Equipment: Cane - single point      Prior Function Level of  Independence: Independent with assistive device(s)         Comments: Pt Mod Ind with amb with a SPC (walking stick) limited community distances, Ind with ADLs, > 10 falls in the last year.     Hand Dominance        Extremity/Trunk Assessment   Upper Extremity Assessment Upper Extremity Assessment: Defer to OT evaluation    Lower Extremity Assessment Lower Extremity Assessment: Generalized weakness       Communication   Communication: No difficulties  Cognition Arousal/Alertness: Awake/alert Behavior During Therapy: WFL for tasks assessed/performed Overall Cognitive Status: Within Functional Limits for tasks assessed                                 General Comments: Pt impulsive requiring several cues for general safety during the session      General Comments      Exercises Total Joint Exercises Ankle Circles/Pumps: AROM;Strengthening;Both;10 reps;15 reps Quad Sets: Strengthening;Both;10 reps;15 reps Gluteal Sets: Strengthening;Both;15 reps;10 reps Long Arc Quad: Strengthening;Both;10 reps;15 reps Knee Flexion: Strengthening;Both;10 reps;15 reps Marching in Standing: AROM;Both;10 reps;Standing Other Exercises Other Exercises: HEP education and review for BLE APs, QS, GS, and LAQ x 10 each 5-6x/day   Assessment/Plan    PT Assessment Patient needs continued PT services  PT Problem List Decreased strength;Decreased activity tolerance;Decreased balance       PT Treatment Interventions DME instruction;Gait training;Stair training;Functional mobility training;Therapeutic activities;Therapeutic exercise;Balance training;Patient/family education    PT Goals (Current goals can be found in the Care Plan section)  Acute Rehab PT Goals Patient Stated Goal: To get stronger and walk better PT Goal Formulation: With patient Time For Goal Achievement: 10/07/18 Potential to Achieve Goals: Fair    Frequency Min 2X/week   Barriers to discharge         Co-evaluation               AM-PAC PT "6 Clicks" Mobility  Outcome Measure Help needed turning from your back to your side while in a flat bed without using bedrails?: None Help needed moving from lying on your back to sitting on the side of a flat bed without using bedrails?: None Help needed moving to and from a bed to a chair (including a wheelchair)?: A Little Help needed standing up from a chair using your arms (e.g., wheelchair or bedside chair)?: A Little Help needed to walk in hospital room?: A Little Help needed climbing 3-5 steps with a railing? : A Little 6 Click Score: 20    End of Session Equipment Utilized During Treatment: Gait belt Activity Tolerance: Other (comment)(Pt limited by nausea and vomiting) Patient left: in bed;with call bell/phone within reach;with bed alarm set Nurse Communication: Mobility status;Other (comment)(Pt requesting meds for nausea) PT Visit Diagnosis: History of falling (Z91.81);Repeated falls (R29.6);Muscle weakness (generalized) (M62.81);Difficulty in walking, not elsewhere classified (R26.2)    Time: 9509-3267 PT Time Calculation (min) (ACUTE ONLY): 37 min   Charges:   PT Evaluation $PT Eval Low Complexity: 1 Low PT Treatments $Therapeutic Exercise: 8-22 mins  Linus Salmons PT, DPT 09/24/18, 1:34 PM

## 2018-09-24 NOTE — Progress Notes (Signed)
[place] at [place] NAME: Randy Howard    MR#:  [id number]  DATE OF BIRTH:  1940-09-29  SUBJECTIVE:  CHIEF COMPLAINT:  No chief complaint on file.  Pain in right thigh is improved.  Able to move right lower extremity better.  Afebrile.   REVIEW OF SYSTEMS:    Review of Systems  Constitutional: Negative for chills and fever.  HENT: Negative for sore throat.   Eyes: Negative for blurred vision, double vision and pain.  Respiratory: Negative for cough, hemoptysis, shortness of breath and wheezing.   Cardiovascular: Negative for chest pain, palpitations, orthopnea and leg swelling.  Gastrointestinal: Negative for abdominal pain, constipation, diarrhea, heartburn, nausea and vomiting.  Genitourinary: Negative for dysuria and hematuria.  Musculoskeletal: Negative for back pain and joint pain.  Skin: Negative for rash.  Neurological: Negative for sensory change, speech change, focal weakness and headaches.  Endo/Heme/Allergies: Does not bruise/bleed easily.  Psychiatric/Behavioral: Negative for depression. The patient is not nervous/anxious.     DRUG ALLERGIES:   Allergies  Allergen Reactions  . Oxycodone Nausea Only    VITALS:  Blood pressure (!) 119/52, pulse 67, temperature (!) 97.5 F (36.4 C), temperature source Oral, resp. rate 18, height 5\' 11"  (1.803 m), weight 68 kg, SpO2 100 %.  PHYSICAL EXAMINATION:   Physical Exam  GENERAL:  78 y.o.-year-old patient lying in the bed with no acute distress.  EYES: Pupils equal, round, reactive to light and accommodation. No scleral icterus. Extraocular muscles intact.  HEENT: Head atraumatic, normocephalic. Oropharynx and nasopharynx clear.  NECK:  Supple, no jugular venous distention. No thyroid enlargement, no tenderness.  LUNGS: Normal breath sounds bilaterally, no wheezing, rales, rhonchi. No use of accessory muscles of respiration.  CARDIOVASCULAR: S1, S2 normal. No murmurs, rubs, or  gallops.  ABDOMEN: Soft, nontender, nondistended. Bowel sounds present. No organomegaly or mass.  EXTREMITIES: No cyanosis, clubbing or edema b/l.    NEUROLOGIC: Cranial nerves II through XII are intact. No focal Motor or sensory deficits b/l.   PSYCHIATRIC: The patient is alert and oriented x 3.  SKIN: No obvious rash, lesion, or ulcer.   LABORATORY PANEL:   CBC Recent Labs  Lab 09/24/18 0454  WBC 5.4  HGB 10.3*  HCT 31.4*  PLT 105*   ------------------------------------------------------------------------------------------------------------------ Chemistries  Recent Labs  Lab 09/24/18 0454  NA 137  K 4.0  CL 99  CO2 30  GLUCOSE 312*  BUN 33*  CREATININE 1.58*  CALCIUM 8.6*  MG 2.0   ------------------------------------------------------------------------------------------------------------------  Cardiac Enzymes No results for input(s): TROPONINI in the last 168 hours. ------------------------------------------------------------------------------------------------------------------  RADIOLOGY:  No results found.   ASSESSMENT AND PLAN:   #Right groin hematoma following drug-coated angioplasty balloon placement Management per primary team.  Improving.  #Chronic nonhealing  Lt foot ulcer probably from underlying peripheral vascular disease  #Diabetes mellitus - sliding scale, continue home medication Amaryl  #Essential hypertension continue home medication hydrochlorothiazide and benazepril and continue close monitoring of the labs  #Chronic kidney disease stage III-monitor renal functions and avoid nephrotoxins Baseline creatinine seems to be at 1.5-1.6  #Diabetic neuropathy-Neurontin will be continued  #Hyperlipidemia continue Lipitor his home medication  All the records are reviewed and case discussed with Care Management/Social Worker Management plans discussed with the patient, family and they are in agreement.  CODE STATUS: FULL CODE  DVT  Prophylaxis: SCDs  TOTAL TIME TAKING CARE OF THIS PATIENT: 35 minutes.   POSSIBLE D/C IN 1-2 DAYS, DEPENDING ON CLINICAL  CONDITION.  Leia Alf Treyvion Durkee M.D on 09/24/2018 at 11:54 AM  Between 7am to 6pm - Pager - [phone]  After 6pm go to [email or web address] - password [email or web address] [facility]  Office  [phone]  CC: Primary care physician; Olin Hauser, DO  Note: This dictation was prepared with Dragon dictation along with smaller phrase technology. Any transcriptional errors that result from this process are unintentional.

## 2018-09-25 DIAGNOSIS — E1140 Type 2 diabetes mellitus with diabetic neuropathy, unspecified: Secondary | ICD-10-CM | POA: Diagnosis present

## 2018-09-25 DIAGNOSIS — N183 Chronic kidney disease, stage 3 (moderate): Secondary | ICD-10-CM | POA: Diagnosis present

## 2018-09-25 DIAGNOSIS — N179 Acute kidney failure, unspecified: Secondary | ICD-10-CM | POA: Diagnosis not present

## 2018-09-25 DIAGNOSIS — I2510 Atherosclerotic heart disease of native coronary artery without angina pectoris: Secondary | ICD-10-CM | POA: Diagnosis present

## 2018-09-25 DIAGNOSIS — E11621 Type 2 diabetes mellitus with foot ulcer: Secondary | ICD-10-CM | POA: Diagnosis present

## 2018-09-25 DIAGNOSIS — Z885 Allergy status to narcotic agent status: Secondary | ICD-10-CM | POA: Diagnosis not present

## 2018-09-25 DIAGNOSIS — Z79899 Other long term (current) drug therapy: Secondary | ICD-10-CM | POA: Diagnosis not present

## 2018-09-25 DIAGNOSIS — I70222 Atherosclerosis of native arteries of extremities with rest pain, left leg: Secondary | ICD-10-CM | POA: Diagnosis present

## 2018-09-25 DIAGNOSIS — D62 Acute posthemorrhagic anemia: Secondary | ICD-10-CM | POA: Diagnosis not present

## 2018-09-25 DIAGNOSIS — I97638 Postprocedural hematoma of a circulatory system organ or structure following other circulatory system procedure: Secondary | ICD-10-CM | POA: Diagnosis not present

## 2018-09-25 DIAGNOSIS — E1122 Type 2 diabetes mellitus with diabetic chronic kidney disease: Secondary | ICD-10-CM | POA: Diagnosis present

## 2018-09-25 DIAGNOSIS — Z79891 Long term (current) use of opiate analgesic: Secondary | ICD-10-CM | POA: Diagnosis not present

## 2018-09-25 DIAGNOSIS — I70239 Atherosclerosis of native arteries of right leg with ulceration of unspecified site: Secondary | ICD-10-CM | POA: Diagnosis present

## 2018-09-25 DIAGNOSIS — L97529 Non-pressure chronic ulcer of other part of left foot with unspecified severity: Secondary | ICD-10-CM | POA: Diagnosis present

## 2018-09-25 DIAGNOSIS — M79651 Pain in right thigh: Secondary | ICD-10-CM | POA: Diagnosis present

## 2018-09-25 DIAGNOSIS — E1151 Type 2 diabetes mellitus with diabetic peripheral angiopathy without gangrene: Secondary | ICD-10-CM | POA: Diagnosis present

## 2018-09-25 DIAGNOSIS — E785 Hyperlipidemia, unspecified: Secondary | ICD-10-CM | POA: Diagnosis present

## 2018-09-25 DIAGNOSIS — Z1159 Encounter for screening for other viral diseases: Secondary | ICD-10-CM | POA: Diagnosis not present

## 2018-09-25 DIAGNOSIS — Z7982 Long term (current) use of aspirin: Secondary | ICD-10-CM | POA: Diagnosis not present

## 2018-09-25 DIAGNOSIS — Z85828 Personal history of other malignant neoplasm of skin: Secondary | ICD-10-CM | POA: Diagnosis not present

## 2018-09-25 DIAGNOSIS — G4733 Obstructive sleep apnea (adult) (pediatric): Secondary | ICD-10-CM | POA: Diagnosis present

## 2018-09-25 DIAGNOSIS — Z951 Presence of aortocoronary bypass graft: Secondary | ICD-10-CM | POA: Diagnosis not present

## 2018-09-25 DIAGNOSIS — I129 Hypertensive chronic kidney disease with stage 1 through stage 4 chronic kidney disease, or unspecified chronic kidney disease: Secondary | ICD-10-CM | POA: Diagnosis present

## 2018-09-25 DIAGNOSIS — Z87891 Personal history of nicotine dependence: Secondary | ICD-10-CM | POA: Diagnosis not present

## 2018-09-25 DIAGNOSIS — Z7902 Long term (current) use of antithrombotics/antiplatelets: Secondary | ICD-10-CM | POA: Diagnosis not present

## 2018-09-25 LAB — BASIC METABOLIC PANEL
Anion gap: 10 (ref 5–15)
BUN: 42 mg/dL — ABNORMAL HIGH (ref 8–23)
CO2: 29 mmol/L (ref 22–32)
Calcium: 8.8 mg/dL — ABNORMAL LOW (ref 8.9–10.3)
Chloride: 99 mmol/L (ref 98–111)
Creatinine, Ser: 1.89 mg/dL — ABNORMAL HIGH (ref 0.61–1.24)
GFR calc Af Amer: 38 mL/min — ABNORMAL LOW (ref >60)
GFR calc non Af Amer: 33 mL/min — ABNORMAL LOW (ref >60)
Glucose, Bld: 166 mg/dL — ABNORMAL HIGH (ref 70–99)
Potassium: 3.6 mmol/L (ref 3.5–5.1)
Sodium: 138 mmol/L (ref 135–145)

## 2018-09-25 LAB — CBC
HCT: 29.9 % — ABNORMAL LOW (ref 39.0–52.0)
Hemoglobin: 9.7 g/dL — ABNORMAL LOW (ref 13.0–17.0)
MCH: 29.3 pg (ref 26.0–34.0)
MCHC: 32.4 g/dL (ref 30.0–36.0)
MCV: 90.3 fL (ref 80.0–100.0)
Platelets: 112 10*3/uL — ABNORMAL LOW (ref 150–400)
RBC: 3.31 MIL/uL — ABNORMAL LOW (ref 4.22–5.81)
RDW: 13.2 % (ref 11.5–15.5)
WBC: 5.9 10*3/uL (ref 4.0–10.5)
nRBC: 0 % (ref 0.0–0.2)

## 2018-09-25 LAB — GLUCOSE, CAPILLARY
Glucose-Capillary: 152 mg/dL — ABNORMAL HIGH (ref 70–99)
Glucose-Capillary: 130 mg/dL — ABNORMAL HIGH (ref 70–99)
Glucose-Capillary: 217 mg/dL — ABNORMAL HIGH (ref 70–99)

## 2018-09-25 LAB — MAGNESIUM: Magnesium: 2.1 mg/dL (ref 1.7–2.4)

## 2018-09-25 NOTE — Progress Notes (Signed)
    Subjective  - POD #2  Feels better today Walked with walker around nursing station   Physical Exam:  Right thigh tender but soft.  No obvious hematoma Extremities are at baseline.  No acute ischemia       Assessment/Plan:  POD #1  D/c to home [place] once medical issures resolved Acute renal injury:  Creatinine elevated at baseline, however it is 1.89 today.  I suspect this is related to diuretics.  Will hold ACE and diuretics today today.  Repeat BMET in am  Wells Emmali Karow 09/25/2018 12:44 PM --  Vitals:   09/25/18 0502 09/25/18 0849  BP: (!) 107/50 (!) 119/49  Pulse: (!) 50 (!) 55  Resp: 18   Temp: (!) 97.5 F (36.4 C)   SpO2: 100%     Intake/Output Summary (Last 24 hours) at 09/25/2018 1244 Last data filed at 09/25/2018 0904 [place] per 24 hour  Intake 1680 ml  Output 400 ml  Net 1280 ml     Laboratory CBC    Component Value Date/Time   WBC 5.9 09/25/2018 0305   HGB 9.7 (L) 09/25/2018 0305   HCT 29.9 (L) 09/25/2018 0305   PLT 112 (L) 09/25/2018 0305    BMET    Component Value Date/Time   NA 138 09/25/2018 0305   NA 142 07/30/2013   NA 141 01/05/2012 0724   K 3.6 09/25/2018 0305   K 3.8 01/05/2012 0724   CL 99 09/25/2018 0305   CL 106 01/05/2012 0724   CO2 29 09/25/2018 0305   CO2 28 01/05/2012 0724   GLUCOSE 166 (H) 09/25/2018 0305   GLUCOSE 152 (H) 01/05/2012 0724   BUN 42 (H) 09/25/2018 0305   BUN 15 07/30/2013   BUN 24 (H) 01/05/2012 0724   CREATININE 1.89 (H) 09/25/2018 0305   CREATININE 1.62 (H) 06/01/2017 0822   CALCIUM 8.8 (L) 09/25/2018 0305   CALCIUM 9.0 01/05/2012 0724   GFRNONAA 33 (L) 09/25/2018 0305   GFRNONAA 40 (L) 06/01/2017 0822   GFRAA 38 (L) 09/25/2018 0305   GFRAA 46 (L) 06/01/2017 0822    COAG Lab Results  Component Value Date   INR 1.21 10/24/2014   No results found for: PTT  Antibiotics Anti-infectives (From admission, onward)   Start     Dose/Rate Route Frequency Ordered Stop   09/22/18 2245  ceFAZolin  (ANCEF) IVPB 2g/100 mL premix     2 g 200 mL/hr over 30 Minutes Intravenous  Once 09/22/18 2237 09/23/18 1135       V. Leia Alf, M.D., [facility] [place] Office: [phone] Pager:  [phone]

## 2018-09-25 NOTE — Progress Notes (Signed)
Pt BP 89/51, pulse 63. MD notified. Orders given to give 539ml bolus 0.9% Normal Saline. Orders followed. Rechecked BP 137/49, pulse 58

## 2018-09-25 NOTE — Progress Notes (Signed)
[place] at [place] NAME: Randy Howard    MR#:  [id number]  DATE OF BIRTH:  April 21, 1940  SUBJECTIVE:  CHIEF COMPLAINT:  No chief complaint on file.  Pain in right thigh is improved.  Able to move better With physical therapy and walked in the hallway.   REVIEW OF SYSTEMS:    Review of Systems  Constitutional: Negative for chills and fever.  HENT: Negative for sore throat.   Eyes: Negative for blurred vision, double vision and pain.  Respiratory: Negative for cough, hemoptysis, shortness of breath and wheezing.   Cardiovascular: Negative for chest pain, palpitations, orthopnea and leg swelling.  Gastrointestinal: Negative for abdominal pain, constipation, diarrhea, heartburn, nausea and vomiting.  Genitourinary: Negative for dysuria and hematuria.  Musculoskeletal: Positive for joint pain. Negative for back pain.  Skin: Negative for rash.  Neurological: Negative for sensory change, speech change, focal weakness and headaches.  Endo/Heme/Allergies: Does not bruise/bleed easily.  Psychiatric/Behavioral: Negative for depression. The patient is not nervous/anxious.     DRUG ALLERGIES:   Allergies  Allergen Reactions  . Oxycodone Nausea Only    VITALS:  Blood pressure (!) 119/49, pulse (!) 55, temperature (!) 97.5 F (36.4 C), temperature source Oral, resp. rate 18, height 5\' 11"  (1.803 m), weight 68 kg, SpO2 100 %.  PHYSICAL EXAMINATION:   Physical Exam  GENERAL:  78 y.o.-year-old patient lying in the bed with no acute distress.  EYES: Pupils equal, round, reactive to light and accommodation. No scleral icterus. Extraocular muscles intact.  HEENT: Head atraumatic, normocephalic. Oropharynx and nasopharynx clear.  NECK:  Supple, no jugular venous distention. No thyroid enlargement, no tenderness.  LUNGS: Normal breath sounds bilaterally, no wheezing, rales, rhonchi. No use of accessory muscles of respiration.  CARDIOVASCULAR: S1,  S2 normal. No murmurs, rubs, or gallops.  ABDOMEN: Soft, nontender, nondistended. Bowel sounds present. No organomegaly or mass.  EXTREMITIES: No cyanosis, clubbing or edema b/l.    NEUROLOGIC: Cranial nerves II through XII are intact. No focal Motor or sensory deficits b/l.   PSYCHIATRIC: The patient is alert and oriented x 3.  SKIN: No obvious rash, lesion, or ulcer.   LABORATORY PANEL:   CBC Recent Labs  Lab 09/25/18 0305  WBC 5.9  HGB 9.7*  HCT 29.9*  PLT 112*   ------------------------------------------------------------------------------------------------------------------ Chemistries  Recent Labs  Lab 09/25/18 0305  NA 138  K 3.6  CL 99  CO2 29  GLUCOSE 166*  BUN 42*  CREATININE 1.89*  CALCIUM 8.8*  MG 2.1   ------------------------------------------------------------------------------------------------------------------  Cardiac Enzymes No results for input(s): TROPONINI in the last 168 hours. ------------------------------------------------------------------------------------------------------------------  RADIOLOGY:  No results found.   ASSESSMENT AND PLAN:   #Right groin hematoma following drug-coated angioplasty balloon placement Management per primary team.  Improving. Able to ambulate with physical therapy.  #Chronic nonhealing  Lt foot ulcer probably from underlying peripheral vascular disease  #Diabetes mellitus - sliding scale, continue home medication Amaryl Well controlled  #Essential hypertension continue home medication hydrochlorothiazide and benazepril and continue close monitoring of the labs  #Chronic kidney disease stage III-monitor renal functions and avoid nephrotoxins Baseline creatinine seems to be at 1.5-1.6  #Diabetic neuropathy-Neurontin will be continued  #Hyperlipidemia continue Lipitor his home medication  All the records are reviewed and case discussed with Care Management/Social Worker Management plans  discussed with the patient, family and they are in agreement.  CODE STATUS: FULL CODE  TOTAL TIME TAKING CARE OF THIS PATIENT: 35 minutes.  POSSIBLE D/C IN 1-2 DAYS, DEPENDING ON CLINICAL CONDITION.  [name] M.D on 09/25/2018 at 12:25 PM  Between 7am to 6pm - Pager - [phone]  After 6pm go to [email or web address] - password [email or web address] [place] [facility]  Office  [phone]  CC: Primary care physician; Olin Hauser, DO  Note: This dictation was prepared with Dragon dictation along with smaller phrase technology. Any transcriptional errors that result from this process are unintentional.

## 2018-09-25 NOTE — Progress Notes (Signed)
Occupational Therapy Treatment Patient Details Name: Randy Howard MRN: [id number] DOB: February 08, 1941 Today's Date: 09/25/2018    History of present illness Per MD note: Pt is a 78 y.o.male with a long history of severe peripheral arterial disease now with worsening pain in his right foot. The patient has noninvasive study showing a drop in his ABI into the severely reduced range. The patient is brought in for angiography for further evaluation and potential treatment.   Pt is s/p RLE revascularization.  PMH includes: Chronic L foot wound, DM, HTN, CKD, atherosclerosis, CAD, HLD, neuropathy, PVD, and skin CA.   OT comments  Pt seen for OT tx this date. Pt eager to participate. Pt able to come sit EOB with modified independence, taking additional time/effort to perform. Reporting 7/10 RLE pain with movement. Pt able to don surgical shoe on L foot and regular shoe on R foot without assist seated EOB. Pt stood with handheld assist with initially poor balance but able to self correct with CGA. Ambulated 3 feet with HH assist to sink to perform grooming tasks requiring occasional cues for safety and for UE support on sink counter for stability. Pt continues to benefit from skilled OT services and continue to recommend [facility] services upon discharge.    Follow Up Recommendations  Home health OT    Equipment Recommendations  3 in 1 bedside commode    Recommendations for Other Services      Precautions / Restrictions Precautions Precautions: Fall Restrictions Weight Bearing Restrictions: No Other Position/Activity Restrictions: No WB restrictions noted in chart review with pt arriving at the hospital with a post-op shoe he wears on the LLE secondary to a chronic L foot wound.       Mobility Bed Mobility Overal bed mobility: Modified Independent             General bed mobility comments: slow, additional time to perform  Transfers Overall transfer level: Needs assistance Equipment used: 1  person hand held assist Transfers: Sit to/from Stand Sit to Stand: Min guard         General transfer comment: mild unsteadiness upon initial stand    Balance Overall balance assessment: Needs assistance Sitting-balance support: Feet supported;No upper extremity supported Sitting balance-Leahy Scale: Good     Standing balance support: During functional activity;No upper extremity supported Standing balance-Leahy Scale: Fair                             ADL either performed or assessed with clinical judgement   ADL Overall ADL's : Needs assistance/impaired     Grooming: Standing;Set up;Min guard;Oral care;Wash/dry face;Wash/dry hands;Cueing for safety Grooming Details (indicate cue type and reason): cues for UE support on counter as needed             Lower Body Dressing: Sitting/lateral leans;Set up;Min guard Lower Body Dressing Details (indicate cue type and reason): while seated EOB, pt able to don surgical shoe and regular shoe without direct assist, just CGA for sitting balance; pt able to adjust socks without issue, does endorse mild back discomfort                     Vision Patient Visual Report: No change from baseline     Perception     Praxis      Cognition Arousal/Alertness: Awake/alert Behavior During Therapy: WFL for tasks assessed/performed Overall Cognitive Status: Within Functional Limits for tasks assessed  General Comments: mildly impulsive this date, very talkative, requiring cues to redirect at times        Exercises Other Exercises Other Exercises: pt educated in activity pacing and work simplification to maximize safety with ADL and functional mobility tasks   Shoulder Instructions       General Comments      Pertinent Vitals/ Pain       Pain Assessment: 0-10 Pain Score: 7  Pain Location: RLE Pain Descriptors / Indicators: Sore;Burning;Guarding;Grimacing Pain  Intervention(s): Monitored during session;Limited activity within patient's tolerance;Repositioned  Home Living                                          Prior Functioning/Environment              Frequency  Min 1X/week        Progress Toward Goals  OT Goals(current goals can now be found in the care plan section)  Progress towards OT goals: Progressing toward goals  Acute Rehab OT Goals Patient Stated Goal: To get back to playing [employer] and gardening. OT Goal Formulation: With patient Time For Goal Achievement: 10/08/18 Potential to Achieve Goals: Good  Plan Discharge plan remains appropriate;Frequency needs to be updated    Co-evaluation                 AM-PAC OT "6 Clicks" Daily Activity     Outcome Measure   Help from another person eating meals?: None Help from another person taking care of personal grooming?: A Little Help from another person toileting, which includes using toliet, bedpan, or urinal?: A Little Help from another person bathing (including washing, rinsing, drying)?: A Little Help from another person to put on and taking off regular upper body clothing?: A Little Help from another person to put on and taking off regular lower body clothing?: A Little 6 Click Score: 19    End of Session Equipment Utilized During Treatment: Gait belt  OT Visit Diagnosis: Other abnormalities of gait and mobility (R26.89);Repeated falls (R29.6);Pain Pain - Right/Left: Right Pain - part of body: Hip;Knee;Leg   Activity Tolerance Patient tolerated treatment well   Patient Left in bed;with call bell/phone within reach;with bed alarm set   Nurse Communication          Time: 1543-1610 OT Time Calculation (min): 27 min  Charges: OT General Charges $OT Visit: 1 Visit OT Treatments $Self Care/Home Management : 23-37 mins  Jeni Salles, MPH, MS, OTR/L ascom [phone] 09/25/18, 4:23 PM

## 2018-09-26 LAB — BASIC METABOLIC PANEL
Anion gap: 9 (ref 5–15)
BUN: 42 mg/dL — ABNORMAL HIGH (ref 8–23)
CO2: 28 mmol/L (ref 22–32)
Calcium: 9.2 mg/dL (ref 8.9–10.3)
Chloride: 98 mmol/L (ref 98–111)
Creatinine, Ser: 1.47 mg/dL — ABNORMAL HIGH (ref 0.61–1.24)
GFR calc Af Amer: 52 mL/min — ABNORMAL LOW (ref >60)
GFR calc non Af Amer: 45 mL/min — ABNORMAL LOW (ref >60)
Glucose, Bld: 178 mg/dL — ABNORMAL HIGH (ref 70–99)
Potassium: 3.8 mmol/L (ref 3.5–5.1)
Sodium: 135 mmol/L (ref 135–145)

## 2018-09-26 LAB — GLUCOSE, CAPILLARY
Glucose-Capillary: 124 mg/dL — ABNORMAL HIGH (ref 70–99)
Glucose-Capillary: 187 mg/dL — ABNORMAL HIGH (ref 70–99)

## 2018-09-26 MED ORDER — CLOPIDOGREL BISULFATE 75 MG PO TABS: 75.0000 mg | ORAL_TABLET | Freq: Every day | ORAL | 6 refills | Status: DC

## 2018-09-26 MED ORDER — BACLOFEN 10 MG PO TABS: 10.0000 mg | ORAL_TABLET | Freq: Three times a day (TID) | ORAL | 0 refills | Status: DC

## 2018-09-26 MED ORDER — MIRTAZAPINE 15 MG PO TABS: 15.0000 mg | ORAL_TABLET | Freq: Every day | ORAL | 0 refills | Status: DC

## 2018-09-26 MED ORDER — FINASTERIDE 5 MG PO TABS: 5.0000 mg | ORAL_TABLET | Freq: Every day | ORAL | 0 refills | Status: DC

## 2018-09-26 MED ORDER — GLIMEPIRIDE 4 MG PO TABS: 4.0000 mg | ORAL_TABLET | Freq: Every day | ORAL | 0 refills | Status: DC

## 2018-09-26 MED ORDER — NITROGLYCERIN 0.4 MG SL SUBL: 0.4000 mg | SUBLINGUAL_TABLET | SUBLINGUAL | 12 refills | Status: DC | PRN

## 2018-09-26 MED ORDER — METOPROLOL TARTRATE 50 MG PO TABS: 50.0000 mg | ORAL_TABLET | Freq: Two times a day (BID) | ORAL | 3 refills | Status: DC

## 2018-09-26 MED ORDER — HYDROCODONE-ACETAMINOPHEN 5-325 MG PO TABS: 1.0000 | ORAL_TABLET | ORAL | 0 refills | Status: DC | PRN

## 2018-09-26 MED ORDER — GABAPENTIN 100 MG PO CAPS: 100.0000 mg | ORAL_CAPSULE | Freq: Every day | ORAL | 0 refills | Status: DC

## 2018-09-26 MED ORDER — ATORVASTATIN CALCIUM 10 MG PO TABS: 10.0000 mg | ORAL_TABLET | Freq: Every day | ORAL | 3 refills | Status: DC

## 2018-09-26 NOTE — Progress Notes (Signed)
The patient ambulated in the hallway this morning.

## 2018-09-26 NOTE — Discharge Instructions (Signed)
Call Dr. Bunnie Domino office for follow up appointment

## 2018-09-26 NOTE — Discharge Summary (Signed)
Physician Discharge Summary  Patient ID: Randy Howard MRN: [id number] DOB/AGE: 1940/07/06 78 y.o.  Admit date: 09/23/2018 Discharge date: 09/26/2018  Admission Diagnoses:  Discharge Diagnoses:  Active Problems:   Atherosclerotic vascular disease   Discharged Condition: good  Hospital Course: 78 yo admitted after angio.  He had right thigh pain after failed PTA.  No significant hematoma.  He developed acute renal issues and had his diuretics and ACE held.  His creatinine improved to baseline.  He is refusing SNF.  He will go home with [facility].  Consults: None  Significant Diagnostic Studies: angiography: PTA right PFA    Discharge Exam: Blood pressure (!) 96/40, pulse (!) 58, temperature (!) 97.4 F (36.3 C), temperature source Oral, resp. rate 16, height 5\' 11"  (1.803 m), weight 68 kg, SpO2 100 %. extremities warm  No thigh hematoma  Disposition: home   Allergies as of 09/26/2018      Reactions   Oxycodone Nausea Only      Medication List    TAKE these medications   aspirin EC 81 MG tablet Take 1 tablet (81 mg total) by mouth daily.   atorvastatin 10 MG tablet Commonly known as: Lipitor Take 1 tablet (10 mg total) by mouth daily. What changed: Another medication with the same name was added. Make sure you understand how and when to take each.   atorvastatin 10 MG tablet Commonly known as: Lipitor Take 1 tablet (10 mg total) by mouth at bedtime. What changed: You were already taking a medication with the same name, and this prescription was added. Make sure you understand how and when to take each.   baclofen 10 MG tablet Commonly known as: LIORESAL TAKE 1 TABLET BY MOUTH THREE TIMES DAILYAS NEEDED What changed: Another medication with the same name was added. Make sure you understand how and when to take each.   baclofen 10 MG tablet Commonly known as: LIORESAL Take 1 tablet (10 mg total) by mouth 3 (three) times daily. What changed: You were already taking a  medication with the same name, and this prescription was added. Make sure you understand how and when to take each.   benazepril 40 MG tablet Commonly known as: LOTENSIN TAKE 1 TABLET BY MOUTH ONCE DAILY   busPIRone 5 MG tablet Commonly known as: BUSPAR TAKE 1 TABLET BY MOUTH ONCE DAILY AS NEEDED.   clopidogrel 75 MG tablet Commonly known as: PLAVIX TAKE 1 TABLET BY MOUTH ONCE DAILY. What changed: Another medication with the same name was added. Make sure you understand how and when to take each.   clopidogrel 75 MG tablet Commonly known as: PLAVIX Take 1 tablet (75 mg total) by mouth daily. Start taking on: September 27, 2018 What changed: You were already taking a medication with the same name, and this prescription was added. Make sure you understand how and when to take each.   finasteride 5 MG tablet Commonly known as: PROSCAR TAKE 1 TABLET BY MOUTH ONCE DAILY. What changed: Another medication with the same name was added. Make sure you understand how and when to take each.   finasteride 5 MG tablet Commonly known as: PROSCAR Take 1 tablet (5 mg total) by mouth daily. Start taking on: September 27, 2018 What changed: You were already taking a medication with the same name, and this prescription was added. Make sure you understand how and when to take each.   gabapentin 100 MG capsule Commonly known as: NEURONTIN TAKE 2 CAPSULES BY MOUTH IN THE  MORNING AND 1-2 CAPSULES AT BEDTIME. What changed: Another medication with the same name was added. Make sure you understand how and when to take each.   gabapentin 100 MG capsule Commonly known as: NEURONTIN Take 1 capsule (100 mg total) by mouth at bedtime. What changed: You were already taking a medication with the same name, and this prescription was added. Make sure you understand how and when to take each.   glimepiride 4 MG tablet Commonly known as: AMARYL TAKE 1 TABLET BY MOUTH ONCE EVERY MORNING What changed: Another medication  with the same name was added. Make sure you understand how and when to take each.   glimepiride 4 MG tablet Commonly known as: AMARYL Take 1 tablet (4 mg total) by mouth daily with breakfast. Start taking on: September 27, 2018 What changed: You were already taking a medication with the same name, and this prescription was added. Make sure you understand how and when to take each.   hydrochlorothiazide 25 MG tablet Commonly known as: HYDRODIURIL Take 25 mg by mouth daily.   HYDROcodone-acetaminophen 5-325 MG tablet Commonly known as: NORCO/VICODIN Take 1 tablet by mouth every 4 (four) hours as needed for severe pain.   hydrOXYzine 10 MG tablet Commonly known as: ATARAX/VISTARIL TAKE 1 TABLET BY MOUTH 3 TIMES DAILY AS NEEDED.   Magnesium Oxide (Antacid) 500 MG Caps Take by mouth.   meclizine 25 MG tablet Commonly known as: ANTIVERT Take 1 tablet (25 mg total) by mouth daily as needed for dizziness.   metoprolol tartrate 50 MG tablet Commonly known as: LOPRESSOR TAKE 1 TABLET BY MOUTH TWICE DAILY What changed: Another medication with the same name was added. Make sure you understand how and when to take each.   metoprolol tartrate 50 MG tablet Commonly known as: LOPRESSOR Take 1 tablet (50 mg total) by mouth 2 (two) times daily. What changed: You were already taking a medication with the same name, and this prescription was added. Make sure you understand how and when to take each.   mirtazapine 15 MG tablet Commonly known as: REMERON What changed: Another medication with the same name was added. Make sure you understand how and when to take each.   mirtazapine 15 MG tablet Commonly known as: REMERON Take 1 tablet (15 mg total) by mouth at bedtime. What changed: You were already taking a medication with the same name, and this prescription was added. Make sure you understand how and when to take each.   nitroGLYCERIN 0.4 MG SL tablet Commonly known as: NITROSTAT Place 1 tablet  (0.4 mg total) under the tongue every 5 (five) minutes as needed for chest pain. What changed: Another medication with the same name was added. Make sure you understand how and when to take each.   nitroGLYCERIN 0.4 MG SL tablet Commonly known as: NITROSTAT Place 1 tablet (0.4 mg total) under the tongue every 5 (five) minutes as needed for chest pain. What changed: You were already taking a medication with the same name, and this prescription was added. Make sure you understand how and when to take each.   Potassium 99 MG Tabs Take by mouth.   tamsulosin 0.4 MG Caps capsule Commonly known as: FLOMAX TAKE 1 CAPSULE BY MOUTH ONCE DAILY   torsemide 20 MG tablet Commonly known as: DEMADEX TAKE 20 MG BY MOUTH TWICE DAILY FOR 3 DAYS, THEN 20 MG DAILY THEREAFTER   traMADol 50 MG tablet Commonly known as: ULTRAM Take 1 tablet (50 mg total) by  mouth every 12 (twelve) hours as needed.   triamcinolone ointment 0.1 % Commonly known as: KENALOG Apply topically.        Signed: Wells Kaelynne Christley 09/26/2018, 2:55 PM

## 2018-09-26 NOTE — Plan of Care (Signed)
  Problem: Education: Goal: Knowledge of General Education information will improve Description: Including pain rating scale, medication(s)/side effects and non-pharmacologic comfort measures 09/26/2018 1708 by Randy Howard, Cory Roughen, RN Outcome: Adequate for Discharge 09/26/2018 1636 by Randy Howard, Cory Roughen, RN Outcome: Progressing   Problem: Health Behavior/Discharge Planning: Goal: Ability to manage health-related needs will improve 09/26/2018 1708 by Randy Howard, Cory Roughen, RN Outcome: Adequate for Discharge 09/26/2018 1636 by Randy Howard, Cory Roughen, RN Outcome: Progressing   Problem: Clinical Measurements: Goal: Ability to maintain clinical measurements within normal limits will improve 09/26/2018 1708 by Randy Howard, Cory Roughen, RN Outcome: Adequate for Discharge 09/26/2018 1636 by Randy Howard, Cory Roughen, RN Outcome: Progressing Goal: Will remain free from infection 09/26/2018 1708 by Ronna Polio, RN Outcome: Adequate for Discharge 09/26/2018 1636 by Randy Howard, Cory Roughen, RN Outcome: Progressing Goal: Diagnostic test results will improve 09/26/2018 1708 by Ronna Polio, RN Outcome: Adequate for Discharge 09/26/2018 1636 by Randy Howard, Cory Roughen, RN Outcome: Progressing Goal: Respiratory complications will improve 09/26/2018 1708 by Ronna Polio, RN Outcome: Adequate for Discharge 09/26/2018 1636 by Randy Howard, Cory Roughen, RN Outcome: Progressing Goal: Cardiovascular complication will be avoided 09/26/2018 1708 by Ronna Polio, RN Outcome: Adequate for Discharge 09/26/2018 1636 by Ronna Polio, RN Outcome: Progressing   Problem: Activity: Goal: Risk for activity intolerance will decrease 09/26/2018 1708 by Randy Howard, Cory Roughen, RN Outcome: Adequate for Discharge 09/26/2018 1636 by Randy Howard, Cory Roughen, RN Outcome: Progressing   Problem: Nutrition: Goal: Adequate nutrition will be maintained 09/26/2018 1708 by Randy Howard, Cory Roughen, RN Outcome: Adequate for Discharge 09/26/2018 1636 by Randy Howard, Cory Roughen, RN Outcome: Progressing   Problem: Coping: Goal: Level of anxiety will decrease 09/26/2018 1708 by Randy Howard, Cory Roughen, RN Outcome: Adequate for Discharge 09/26/2018 1636 by Randy Howard, Cory Roughen, RN Outcome: Progressing   Problem: Elimination: Goal: Will not experience complications related to bowel motility 09/26/2018 1708 by Ronna Polio, RN Outcome: Adequate for Discharge 09/26/2018 1636 by Randy Howard, Cory Roughen, RN Outcome: Progressing Goal: Will not experience complications related to urinary retention 09/26/2018 1708 by Ronna Polio, RN Outcome: Adequate for Discharge 09/26/2018 1636 by Randy Howard, Cory Roughen, RN Outcome: Progressing   Problem: Pain Managment: Goal: General experience of comfort will improve 09/26/2018 1708 by Ronna Polio, RN Outcome: Adequate for Discharge 09/26/2018 1636 by Randy Howard, Cory Roughen, RN Outcome: Progressing   Problem: Safety: Goal: Ability to remain free from injury will improve 09/26/2018 1708 by Randy Howard, Cory Roughen, RN Outcome: Adequate for Discharge 09/26/2018 1636 by Randy Howard, Cory Roughen, RN Outcome: Progressing   Problem: Skin Integrity: Goal: Risk for impaired skin integrity will decrease 09/26/2018 1708 by Ronna Polio, RN Outcome: Adequate for Discharge 09/26/2018 1636 by Randy Howard, Cory Roughen, RN Outcome: Progressing   Problem: Acute Rehab PT Goals(only PT should resolve) Goal: Pt Will Transfer Bed To Chair/Chair To Bed Outcome: Adequate for Discharge Goal: Pt Will Ambulate Outcome: Adequate for Discharge Goal: Pt Will Go Up/Down Stairs Outcome: Adequate for Discharge   Problem: Increased Nutrient Needs (NI-5.1) Goal: Food and/or nutrient delivery Description: Individualized approach for food/nutrient provision. Outcome: Adequate for Discharge   Problem: Acute Rehab OT Goals  (only OT should resolve) Goal: Pt. Will Perform Lower Body Dressing Outcome: Adequate for Discharge Goal: Pt. Will Transfer To Toilet Outcome: Adequate for Discharge Goal: Pt. Will Perform Toileting-Clothing Manipulation Outcome: Adequate for Discharge

## 2018-09-26 NOTE — Final Progress Note (Signed)
    Subjective  -   Ready to go home   Physical Exam:  Extremities at baseline.  No acute ischemia Right thigh soft       Assessment/Plan:    Renal issues resolved Pain tolerable D/c with HH  Wells Brabham 09/26/2018 2:56 PM --  Vitals:   09/26/18 0821 09/26/18 1206  BP: 118/64 (!) 96/40  Pulse: (!) 59 (!) 58  Resp: 17 16  Temp: (!) 97.3 F (36.3 C) (!) 97.4 F (36.3 C)  SpO2: 97% 100%    Intake/Output Summary (Last 24 hours) at 09/26/2018 1456 Last data filed at 09/26/2018 1300 Gross per 24 hour  Intake 480 ml  Output 1650 ml  Net -1170 ml     Laboratory CBC    Component Value Date/Time   WBC 5.9 09/25/2018 0305   HGB 9.7 (L) 09/25/2018 0305   HCT 29.9 (L) 09/25/2018 0305   PLT 112 (L) 09/25/2018 0305    BMET    Component Value Date/Time   NA 135 09/26/2018 0838   NA 142 07/30/2013   NA 141 01/05/2012 0724   K 3.8 09/26/2018 0838   K 3.8 01/05/2012 0724   CL 98 09/26/2018 0838   CL 106 01/05/2012 0724   CO2 28 09/26/2018 0838   CO2 28 01/05/2012 0724   GLUCOSE 178 (H) 09/26/2018 0838   GLUCOSE 152 (H) 01/05/2012 0724   BUN 42 (H) 09/26/2018 0838   BUN 15 07/30/2013   BUN 24 (H) 01/05/2012 0724   CREATININE 1.47 (H) 09/26/2018 0838   CREATININE 1.62 (H) 06/01/2017 0822   CALCIUM 9.2 09/26/2018 0838   CALCIUM 9.0 01/05/2012 0724   GFRNONAA 45 (L) 09/26/2018 0838   GFRNONAA 40 (L) 06/01/2017 0822   GFRAA 52 (L) 09/26/2018 0838   GFRAA 46 (L) 06/01/2017 0822    COAG Lab Results  Component Value Date   INR 1.21 10/24/2014   No results found for: PTT  Antibiotics Anti-infectives (From admission, onward)   Start     Dose/Rate Route Frequency Ordered Stop   09/22/18 2245  ceFAZolin (ANCEF) IVPB 2g/100 mL premix     2 g 200 mL/hr over 30 Minutes Intravenous  Once 09/22/18 2237 09/23/18 1135       V. Leia Alf, M.D., [facility] [place] Office: [phone] Pager:  [phone]

## 2018-09-26 NOTE — Plan of Care (Signed)

## 2018-09-26 NOTE — Progress Notes (Signed)
[place] at [place] NAME: Randy Howard    MR#:  [id number]  DATE OF BIRTH:  11-Dec-1940  SUBJECTIVE:  CHIEF COMPLAINT:  No chief complaint on file.  Ambulated in the hallway.  REVIEW OF SYSTEMS:    Review of Systems  Constitutional: Negative for chills and fever.  HENT: Negative for sore throat.   Eyes: Negative for blurred vision, double vision and pain.  Respiratory: Negative for cough, hemoptysis, shortness of breath and wheezing.   Cardiovascular: Negative for chest pain, palpitations, orthopnea and leg swelling.  Gastrointestinal: Negative for abdominal pain, constipation, diarrhea, heartburn, nausea and vomiting.  Genitourinary: Negative for dysuria and hematuria.  Musculoskeletal: Positive for joint pain. Negative for back pain.  Skin: Negative for rash.  Neurological: Negative for sensory change, speech change, focal weakness and headaches.  Endo/Heme/Allergies: Does not bruise/bleed easily.  Psychiatric/Behavioral: Negative for depression. The patient is not nervous/anxious.     DRUG ALLERGIES:   Allergies  Allergen Reactions  . Oxycodone Nausea Only    VITALS:  Blood pressure 118/64, pulse (!) 59, temperature (!) 97.3 F (36.3 C), temperature source Oral, resp. rate 17, height 5\' 11"  (1.803 m), weight 68 kg, SpO2 97 %.  PHYSICAL EXAMINATION:   Physical Exam  GENERAL:  78 y.o.-year-old patient lying in the bed with no acute distress.  EYES: Pupils equal, round, reactive to light and accommodation. No scleral icterus. Extraocular muscles intact.  HEENT: Head atraumatic, normocephalic. Oropharynx and nasopharynx clear.  NECK:  Supple, no jugular venous distention. No thyroid enlargement, no tenderness.  LUNGS: Normal breath sounds bilaterally, no wheezing, rales, rhonchi. No use of accessory muscles of respiration.  CARDIOVASCULAR: S1, S2 normal. No murmurs, rubs, or gallops.  ABDOMEN: Soft, nontender, nondistended.  Bowel sounds present. No organomegaly or mass.  EXTREMITIES: No cyanosis, clubbing or edema b/l.    NEUROLOGIC: Cranial nerves II through XII are intact. No focal Motor or sensory deficits b/l.   PSYCHIATRIC: The patient is alert and oriented x 3.  SKIN: No obvious rash, lesion, or ulcer.   LABORATORY PANEL:   CBC Recent Labs  Lab 09/25/18 0305  WBC 5.9  HGB 9.7*  HCT 29.9*  PLT 112*   ------------------------------------------------------------------------------------------------------------------ Chemistries  Recent Labs  Lab 09/25/18 0305 09/26/18 0838  NA 138 135  K 3.6 3.8  CL 99 98  CO2 29 28  GLUCOSE 166* 178*  BUN 42* 42*  CREATININE 1.89* 1.47*  CALCIUM 8.8* 9.2  MG 2.1  --    ------------------------------------------------------------------------------------------------------------------  Cardiac Enzymes No results for input(s): TROPONINI in the last 168 hours. ------------------------------------------------------------------------------------------------------------------  RADIOLOGY:  No results found.   ASSESSMENT AND PLAN:   # Right groin hematoma following drug-coated angioplasty balloon placement Management per primary team.  Improving. Able to ambulate with physical therapy.  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  CODE STATUS: FULL CODE  TOTAL TIME TAKING CARE OF THIS PATIENT: 35 minutes.   POSSIBLE D/C IN 1-2 DAYS, DEPENDING ON CLINICAL  CONDITION.  Neita Carp M.D on 09/26/2018  at 10:34 AM  Between 7am to 6pm - Pager - [phone]  After 6pm go to [email or web address] - password [email or web address] [facility]  Office  [phone]  CC: Primary care physician; Olin Hauser, DO  Note: This dictation was prepared with Dragon dictation along with smaller phrase technology. Any transcriptional errors that result from this process are unintentional.

## 2018-09-27 ENCOUNTER — Encounter: Payer: Self-pay | Admitting: Vascular Surgery

## 2018-09-27 LAB — GLUCOSE, CAPILLARY
Glucose-Capillary: 180 mg/dL — ABNORMAL HIGH (ref 70–99)
Glucose-Capillary: 142 mg/dL — ABNORMAL HIGH (ref 70–99)
Glucose-Capillary: 168 mg/dL — ABNORMAL HIGH (ref 70–99)
Glucose-Capillary: 188 mg/dL — ABNORMAL HIGH (ref 70–99)
Glucose-Capillary: 123 mg/dL — ABNORMAL HIGH (ref 70–99)

## 2018-09-27 NOTE — TOC Transition Note (Signed)
Transition of Care [facility]) - CM/SW Discharge Note   Patient Details  Name: Randy Howard MRN: [id number] Date of Birth: Mar 11, 1941  [facility] CM/SW Contact:  Latanya Maudlin, RN Phone Number: 09/27/2018, 3:34 PM   Clinical Narrative:  Patient to be discharged per MD order. Orders in place for home health services. Patient refused home health. Patient initially refused and then allowed case management to place referral tentatively. Will notify [facility] of refusal. Patients friend will transport.             Patient Goals and CMS Choice Patient states their goals for this hospitalization and ongoing recovery are:: Go home CMS Medicare.gov Compare Post Acute Care list provided to:: Patient Choice offered to / list presented to : Patient  Discharge Placement                       Discharge Plan and Services In-house Referral: (Care management) Discharge Planning Services: CM Consult Post Acute Care Choice: Home Health            DME Agency: [employer] Date DME Agency Contacted: (NA) Time DME Agency Contacted: (NA- obtained walker from closet) Representative spoke with at DME Agency: (Email sent) [place] Arranged: Patient Refused [place], Refused [place] Agency: [facility] [employer] [facility] [employer] [place] Date [place]: 09/24/18 Time [place]: [place] Representative spoke with at [place]: [place] ([place]) Interventions     Readmission Risk Interventions No flowsheet data found.

## 2018-09-28 LAB — AEROBIC/ANAEROBIC CULTURE W GRAM STAIN (SURGICAL/DEEP WOUND)

## 2018-10-06 ENCOUNTER — Other Ambulatory Visit: Payer: Medicare Other

## 2018-10-13 ENCOUNTER — Encounter: Payer: Medicare Other | Admitting: Family Medicine

## 2018-10-20 DIAGNOSIS — L97522 Non-pressure chronic ulcer of other part of left foot with fat layer exposed: Secondary | ICD-10-CM | POA: Diagnosis not present

## 2018-10-20 DIAGNOSIS — E1140 Type 2 diabetes mellitus with diabetic neuropathy, unspecified: Secondary | ICD-10-CM | POA: Diagnosis not present

## 2018-10-20 DIAGNOSIS — I739 Peripheral vascular disease, unspecified: Secondary | ICD-10-CM | POA: Diagnosis not present

## 2018-10-23 ENCOUNTER — Other Ambulatory Visit: Payer: Self-pay | Admitting: Family Medicine

## 2018-10-23 DIAGNOSIS — G4701 Insomnia due to medical condition: Secondary | ICD-10-CM

## 2018-10-25 ENCOUNTER — Other Ambulatory Visit: Payer: Self-pay

## 2018-10-25 ENCOUNTER — Other Ambulatory Visit: Payer: Medicare Other

## 2018-10-25 DIAGNOSIS — E7800 Pure hypercholesterolemia, unspecified: Secondary | ICD-10-CM | POA: Diagnosis not present

## 2018-10-25 DIAGNOSIS — I10 Essential (primary) hypertension: Secondary | ICD-10-CM | POA: Diagnosis not present

## 2018-10-25 DIAGNOSIS — E1142 Type 2 diabetes mellitus with diabetic polyneuropathy: Secondary | ICD-10-CM | POA: Diagnosis not present

## 2018-10-26 LAB — COMPLETE METABOLIC PANEL WITH GFR
AG Ratio: 1.4 (calc) (ref 1.0–2.5)
ALT: 16 U/L (ref 9–46)
AST: 19 U/L (ref 10–35)
Albumin: 3.9 g/dL (ref 3.6–5.1)
Alkaline phosphatase (APISO): 72 U/L (ref 35–144)
BUN/Creatinine Ratio: 17 (calc) (ref 6–22)
BUN: 21 mg/dL (ref 7–25)
CO2: 28 mmol/L (ref 20–32)
Calcium: 9.4 mg/dL (ref 8.6–10.3)
Chloride: 105 mmol/L (ref 98–110)
Creat: 1.27 mg/dL — ABNORMAL HIGH (ref 0.70–1.18)
GFR, Est African American: 62 mL/min/{1.73_m2} (ref >=60)
GFR, Est Non African American: 53 mL/min/{1.73_m2} — ABNORMAL LOW (ref >=60)
Globulin: 2.8 g/dL (calc) (ref 1.9–3.7)
Glucose, Bld: 114 mg/dL — ABNORMAL HIGH (ref 65–99)
Potassium: 3.9 mmol/L (ref 3.5–5.3)
Sodium: 143 mmol/L (ref 135–146)
Total Bilirubin: 0.7 mg/dL (ref 0.2–1.2)
Total Protein: 6.7 g/dL (ref 6.1–8.1)

## 2018-10-26 LAB — LIPID PANEL
Cholesterol: 104 mg/dL (ref <200)
HDL: 32 mg/dL — ABNORMAL LOW (ref >=40)
LDL Cholesterol (Calc): 55 mg/dL (calc)
Non-HDL Cholesterol (Calc): 72 mg/dL (calc) (ref <130)
Total CHOL/HDL Ratio: 3.3 (calc) (ref <5.0)
Triglycerides: 86 mg/dL (ref <150)

## 2018-10-26 LAB — HEMOGLOBIN A1C
Hgb A1c MFr Bld: 7.4 % of total Hgb — ABNORMAL HIGH (ref <5.7)
Mean Plasma Glucose: 166 (calc)
eAG (mmol/L): 9.2 (calc)

## 2018-10-26 LAB — CBC WITH DIFFERENTIAL/PLATELET
Absolute Monocytes: 440 cells/uL (ref 200–950)
Basophils Absolute: 28 cells/uL (ref 0–200)
Basophils Relative: 0.5 %
Eosinophils Absolute: 171 cells/uL (ref 15–500)
Eosinophils Relative: 3.1 %
HCT: 34.5 % — ABNORMAL LOW (ref 38.5–50.0)
Hemoglobin: 11.3 g/dL — ABNORMAL LOW (ref 13.2–17.1)
Lymphs Abs: 1771 cells/uL (ref 850–3900)
MCH: 30.4 pg (ref 27.0–33.0)
MCHC: 32.8 g/dL (ref 32.0–36.0)
MCV: 92.7 fL (ref 80.0–100.0)
MPV: 11.3 fL (ref 7.5–12.5)
Monocytes Relative: 8 %
Neutro Abs: 3091 cells/uL (ref 1500–7800)
Neutrophils Relative %: 56.2 %
Platelets: 148 10*3/uL (ref 140–400)
RBC: 3.72 10*6/uL — ABNORMAL LOW (ref 4.20–5.80)
RDW: 13.1 % (ref 11.0–15.0)
Total Lymphocyte: 32.2 %
WBC: 5.5 10*3/uL (ref 3.8–10.8)

## 2018-10-26 LAB — PSA: PSA: 0.7 ng/mL (ref <=4.0)

## 2018-10-29 ENCOUNTER — Encounter: Payer: Self-pay | Admitting: Family Medicine

## 2018-10-29 ENCOUNTER — Ambulatory Visit (INDEPENDENT_AMBULATORY_CARE_PROVIDER_SITE_OTHER): Payer: Medicare Other | Admitting: Family Medicine

## 2018-10-29 ENCOUNTER — Other Ambulatory Visit: Payer: Self-pay

## 2018-10-29 VITALS — BP 153/63 | HR 68 | Temp 98.4°F | Resp 16 | Ht 71.0 in | Wt 159.0 lb

## 2018-10-29 DIAGNOSIS — I509 Heart failure, unspecified: Secondary | ICD-10-CM

## 2018-10-29 DIAGNOSIS — Z0000 Encounter for general adult medical examination without abnormal findings: Secondary | ICD-10-CM

## 2018-10-29 DIAGNOSIS — R351 Nocturia: Secondary | ICD-10-CM

## 2018-10-29 DIAGNOSIS — G4701 Insomnia due to medical condition: Secondary | ICD-10-CM

## 2018-10-29 DIAGNOSIS — I739 Peripheral vascular disease, unspecified: Secondary | ICD-10-CM | POA: Diagnosis not present

## 2018-10-29 DIAGNOSIS — N183 Chronic kidney disease, stage 3 unspecified: Secondary | ICD-10-CM

## 2018-10-29 DIAGNOSIS — M159 Polyosteoarthritis, unspecified: Secondary | ICD-10-CM

## 2018-10-29 DIAGNOSIS — I70221 Atherosclerosis of native arteries of extremities with rest pain, right leg: Secondary | ICD-10-CM

## 2018-10-29 DIAGNOSIS — M150 Primary generalized (osteo)arthritis: Secondary | ICD-10-CM

## 2018-10-29 DIAGNOSIS — E1142 Type 2 diabetes mellitus with diabetic polyneuropathy: Secondary | ICD-10-CM | POA: Diagnosis not present

## 2018-10-29 DIAGNOSIS — N401 Enlarged prostate with lower urinary tract symptoms: Secondary | ICD-10-CM

## 2018-10-29 DIAGNOSIS — G629 Polyneuropathy, unspecified: Secondary | ICD-10-CM

## 2018-10-29 NOTE — Assessment & Plan Note (Signed)
Chronic problem Gradual worsening Increase Gabapentin up to 3 in AM 1 in afternoon and 2-3 in PM as needed for chronic pain neuropathy

## 2018-10-29 NOTE — Assessment & Plan Note (Signed)
Significant PAD extremities, previous interventions recently 09/2018 Dr Lucky Cowboy angiography Followed by Vascular Surgery / Cardiology Current foot ulceration healing per Podiatry Continue on medical management

## 2018-10-29 NOTE — Assessment & Plan Note (Signed)
Improved DM A1c 7.4 With some history of hyperglycemia Rare episodes of hypoglycemia, complication Complications - CKD-III, peripheral neuropathy, DM retinopathy, other including hyperlipidemia - increases risk of future cardiovascular complications  Plan:  1. Continue current therapy - Glimepiride 4mg  daily - caution hypoglycemia 2. Encourage improved lifestyle - low carb, low sugar diet, reduce portion size, continue improving regular exercise as tolerated 3. Check CBG, bring log to next visit for review 4. Continue ACEi, Statin 5. Advised to schedule DM ophtho exam, send record 6. Follow-up 6 months

## 2018-10-29 NOTE — Progress Notes (Addendum)
Subjective:    Patient ID: Randy Howard, male    DOB: [birth year], 78 y.o.   MRN: [id number]  Randy Howard is a 78 y.o. male presenting on 10/29/2018 for Annual Exam   HPI   Here for Annual Physical and Lab Review.  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 Meds:Glimepiride 4mg  daily in AM Reports good compliance. Tolerating well w/o side-effects Currently on ACEi Rare hypoglycemia symptoms - none recently Due for DM Eye exam - he needs to schedule w/ [facility] Admits numbness feet, neuropathy, ulceration - followed by Dr Sharlotte Alamo at [facility] Denies polyuria, visual changes  CHRONIC HTN/ PAD / Venous Insufficiency Edema / CKD-III Followed by Cardiology, Vascular Surgery Recent BP readings normal - today was elevated On Torsemide. Improved edema Current Meds - Benazepril 40mg  daily, Metoprolol 50mg  BID, HCTZ 25, Torsemide Reports good compliance, took meds today. Tolerating well, w/o complaints. Lifestyle -remainsactive limited regular exercise due to chronic pain Admits some dizziness at times, and edema Denies CP, dyspnea, HA, dizziness / lightheadedness  He will follow-up with Dr Evorn Gong Dermatology for several spots of previous skin cancer, biopsy and cryotherapy.  Chronic Pain / MSK Spasm / Neck - Reports chronic history of pain multiple joints, especially chronic neck and back painsecondary to OA/DJD - Taking Baclofen 10mg  daily in AM- request refill next week - Taking Gabapentin 100mg  capsules x 2 in morningand 1-2 PM - not taking 2 in evening always, thinks this could help him more, has neuropathy, even on spot on back - Taking Tramadol 50mg  in morning- doing well on this medstil- not due for refill yet, he will call next week - Cannot take NSAIDs, not taking Tylenol regularly    Depression screen [facility] 2/9 10/29/2018 01/29/2018 11/12/2016  Decreased Interest 0 1 1  Down,  Depressed, Hopeless 0 0 2  PHQ - 2 Score 0 1 3  Altered sleeping 3 1 3   Tired, decreased energy 3 1 3   Change in appetite 3 0 3  Feeling bad or failure about yourself  0 0 0  Trouble concentrating 0 1 2  Moving slowly or fidgety/restless 0 0 1  Suicidal thoughts 1 0 0  PHQ-9 Score 10 4 15   Difficult doing work/chores Not difficult at all Not difficult at all Somewhat difficult    Past Medical History:  Diagnosis Date  . Arthritis   . Coronary artery disease   . Elevated lipids   . Hyperlipidemia   . Hypertension   . Neuropathy   . Nocturia   . Obstructive sleep apnea   . Peripheral vascular disease ([place])   . Prostate enlargement   . PVD (peripheral vascular disease) ([place])   . Right carotid bruit   . Skin cancer    Followed by Dr. Evorn Gong  . Skin cancer   . Tremor    Past Surgical History:  Procedure Laterality Date  . CARDIAC CATHETERIZATION    . CAROTID ARTERY ANGIOPLASTY Left 09/30/2011  . CORONARY ARTERY BYPASS GRAFT  2006  . ENDARTERECTOMY FEMORAL Bilateral 11/01/2014   Procedure: ENDARTERECTOMY FEMORAL;  Surgeon: Algernon Huxley, MD;  Location: [facility] ORS;  Service: Vascular;  Laterality: Bilateral;  . LOWER EXTREMITY ANGIOGRAPHY Left 04/26/2018   Procedure: LOWER EXTREMITY ANGIOGRAPHY;  Surgeon: Algernon Huxley, MD;  Location: [place] [facility];  Service: Cardiovascular;  Laterality: Left;  . LOWER EXTREMITY ANGIOGRAPHY Right 09/23/2018   Procedure: LOWER EXTREMITY ANGIOGRAPHY;  Surgeon: Algernon Huxley, MD;  Location: [place] [facility];  Service: Cardiovascular;  Laterality: Right;  . PERIPHERAL VASCULAR CATHETERIZATION Left 10/09/2014   Procedure: Lower Extremity Angiography;  Surgeon: Algernon Huxley, MD;  Location: [place] [facility];  Service: Cardiovascular;  Laterality: Left;  . PERIPHERAL VASCULAR CATHETERIZATION  10/09/2014   Procedure: Lower Extremity Intervention;  Surgeon: Algernon Huxley, MD;  Location: [place] [facility];  Service: Cardiovascular;;   Social History    Socioeconomic History  . Marital status: Widowed    Spouse name: Not on file  . Number of children: Not on file  . Years of education: Not on file  . Highest education level: Not on file  Occupational History  . Not on file  Social Needs  . Financial resource strain: Not on file  . Food insecurity    Worry: Not on file    Inability: Not on file  . Transportation needs    Medical: Not on file    Non-medical: Not on file  Tobacco Use  . Smoking status: Former Smoker    Packs/day: 1.00    Years: 25.00    Pack years: 25.00    Types: Cigarettes    Quit date: 03/24/1985    Years since quitting: 33.6  . Smokeless tobacco: Former Network engineer and Sexual Activity  . Alcohol use: No    Alcohol/week: 0.0 standard drinks  . Drug use: No  . Sexual activity: Not on file  Lifestyle  . Physical activity    Days per week: Not on file    Minutes per session: Not on file  . Stress: Not on file  Relationships  . Social Herbalist on phone: Not on file    Gets together: Not on file    Attends religious service: Not on file    Active member of club or organization: Not on file    Attends meetings of clubs or organizations: Not on file    Relationship status: Not on file  . Intimate partner violence    Fear of current or ex partner: Not on file    Emotionally abused: Not on file    Physically abused: Not on file    Forced sexual activity: Not on file  Other Topics Concern  . Not on file  Social History Narrative  . Not on file   Family History  Problem Relation Age of Onset  . Heart disease Sister   . Heart disease Sister   . Diabetes Sister   . Diabetes Sister   . Diabetes Brother    Current Outpatient Medications on File Prior to Visit  Medication Sig  . aspirin EC 81 MG tablet Take 1 tablet (81 mg total) by mouth daily.  Marland Kitchen atorvastatin (LIPITOR) 10 MG tablet Take 1 tablet (10 mg total) by mouth daily.  . baclofen (LIORESAL) 10 MG tablet TAKE 1 TABLET BY  MOUTH THREE TIMES DAILYAS NEEDED  . benazepril (LOTENSIN) 40 MG tablet TAKE 1 TABLET BY MOUTH ONCE DAILY  . busPIRone (BUSPAR) 5 MG tablet TAKE 1 TABLET BY MOUTH ONCE DAILY AS NEEDED.  Marland Kitchen clopidogrel (PLAVIX) 75 MG tablet TAKE 1 TABLET BY MOUTH ONCE DAILY.  . finasteride (PROSCAR) 5 MG tablet TAKE 1 TABLET BY MOUTH ONCE DAILY.  Marland Kitchen gabapentin (NEURONTIN) 100 MG capsule TAKE 2 CAPSULES BY MOUTH IN THE MORNING AND 1-2 CAPSULES AT BEDTIME.  Marland Kitchen glimepiride (AMARYL) 4 MG tablet TAKE 1 TABLET BY MOUTH ONCE EVERY  MORNING  . hydrochlorothiazide (HYDRODIURIL) 25 MG tablet Take 25 mg by mouth daily.  Marland Kitchen HYDROcodone-acetaminophen (NORCO/VICODIN) 5-325 MG tablet Take 1 tablet by mouth every 4 (four) hours as needed for severe pain.  . hydrOXYzine (ATARAX/VISTARIL) 10 MG tablet TAKE 1 TABLET BY MOUTH 3 TIMES DAILY AS NEEDED  . Magnesium Oxide, Antacid, 500 MG CAPS Take by mouth.  . meclizine (ANTIVERT) 25 MG tablet Take 1 tablet (25 mg total) by mouth daily as needed for dizziness.  . metoprolol tartrate (LOPRESSOR) 50 MG tablet TAKE 1 TABLET BY MOUTH TWICE DAILY  . mirtazapine (REMERON) 15 MG tablet   . nitroGLYCERIN (NITROSTAT) 0.4 MG SL tablet Place 1 tablet (0.4 mg total) under the tongue every 5 (five) minutes as needed for chest pain.  Marland Kitchen Potassium 99 MG TABS Take by mouth.  . tamsulosin (FLOMAX) 0.4 MG CAPS capsule TAKE 1 CAPSULE BY MOUTH ONCE DAILY  . torsemide (DEMADEX) 20 MG tablet TAKE 20 MG BY MOUTH TWICE DAILY FOR 3 DAYS, THEN 20 MG DAILY THEREAFTER  . traMADol (ULTRAM) 50 MG tablet Take 1 tablet (50 mg total) by mouth every 12 (twelve) hours as needed.  . triamcinolone ointment (KENALOG) 0.1 % Apply topically.   No current facility-administered medications on file prior to visit.     Review of Systems  Constitutional: Negative for activity change, appetite change, chills, diaphoresis, fatigue and fever.  HENT: Negative for congestion and hearing loss.   Eyes: Negative for visual disturbance.   Respiratory: Negative for apnea, cough, chest tightness, shortness of breath and wheezing.   Cardiovascular: Negative for chest pain, palpitations and leg swelling.  Gastrointestinal: Negative for abdominal pain, anal bleeding, blood in stool, constipation, diarrhea, nausea and vomiting.  Endocrine: Negative for cold intolerance.  Genitourinary: Negative for decreased urine volume, difficulty urinating, dysuria, frequency and hematuria.  Musculoskeletal: Negative for arthralgias, back pain and neck pain.  Skin: Negative for rash.  Allergic/Immunologic: Negative for environmental allergies.  Neurological: Negative for dizziness, weakness, light-headedness, numbness and headaches.  Hematological: Negative for adenopathy.  Psychiatric/Behavioral: Negative for behavioral problems, dysphoric mood and sleep disturbance. The patient is not nervous/anxious.    Per HPI unless specifically indicated above      Objective:    BP (!) 153/63   Pulse 68   Temp 98.4 F (36.9 C) (Oral)   Resp 16   Ht 5\' 11"  (1.803 m)   Wt 159 lb (72.1 kg)   SpO2 100%   BMI 22.18 kg/m   Wt Readings from Last 3 Encounters:  10/29/18 159 lb (72.1 kg)  09/23/18 150 lb (68 kg)  09/10/18 160 lb (72.6 kg)    Physical Exam Vitals signs and nursing note reviewed.  Constitutional:      General: He is not in acute distress.    Appearance: He is well-developed. He is not diaphoretic.     Comments: Well-appearing, comfortable, cooperative  HENT:     Head: Normocephalic and atraumatic.  Eyes:     General:        Right eye: No discharge.        Left eye: No discharge.     Conjunctiva/sclera: Conjunctivae normal.     Pupils: Pupils are equal, round, and reactive to light.  Neck:     Musculoskeletal: Normal range of motion and neck supple.     Thyroid: No thyromegaly.  Cardiovascular:     Rate and Rhythm: Normal rate and regular rhythm.     Heart sounds: Normal heart sounds. No  murmur.  Pulmonary:     Effort:  Pulmonary effort is normal. No respiratory distress.     Breath sounds: Normal breath sounds. No wheezing or rales.  Abdominal:     General: Bowel sounds are normal. There is no distension.     Palpations: Abdomen is soft. There is no mass.     Tenderness: There is no abdominal tenderness.  Musculoskeletal: Normal range of motion.        General: No tenderness.     Right lower leg: Edema (trace to +1) present.     Left lower leg: Edema (trace to +1) present.     Comments: Upper / Lower Extremities: - Normal muscle tone, strength bilateral upper extremities 5/5, lower extremities 5/5  Lymphadenopathy:     Cervical: No cervical adenopathy.  Skin:    General: Skin is warm and dry.     Findings: No erythema or rash.     Comments: Bilateral lower extremities feet ankles with some evidence of chronic venous changes and PAD with some color change stasis dermatitis  Neurological:     Mental Status: He is alert and oriented to person, place, and time.     Coordination: Coordination normal.     Comments: Distal sensation intact to light touch all extremities  Psychiatric:        Behavior: Behavior normal.     Comments: Well groomed, good eye contact, normal speech and thoughts    Results for orders placed or performed during the hospital encounter of 09/23/18  Aerobic/Anaerobic Culture (surgical/deep wound)   Specimen: Ulcer; Wound  Result Value Ref Range   Specimen Description      ULCER Performed at [facility], [address] [place], [place] [address]    Special Requests NONE    Gram Stain      MODERATE WBC PRESENT, PREDOMINANTLY PMN NO ORGANISMS SEEN    Culture      FEW ALCALIGENES FAECALIS NO ANAEROBES ISOLATED Performed at [facility], [place] [address] [place],  [address]    Report Status 09/28/2018 FINAL    Organism ID, Bacteria ALCALIGENES FAECALIS       Susceptibility   Alcaligenes faecalis - MIC*    CEFEPIME 2 SENSITIVE Sensitive      CEFAZOLIN 32 INTERMEDIATE Intermediate     GENTAMICIN 4 SENSITIVE Sensitive     CIPROFLOXACIN 1 SENSITIVE Sensitive     IMIPENEM <=0.25 SENSITIVE Sensitive     TRIMETH/SULFA <=20 SENSITIVE Sensitive     * FEW ALCALIGENES FAECALIS  Glucose, capillary  Result Value Ref Range   Glucose-Capillary 143 (H) 70 - 99 mg/dL  Glucose, capillary  Result Value Ref Range   Glucose-Capillary 146 (H) 70 - 99 mg/dL  CBC  Result Value Ref Range   WBC 5.5 4.0 - 10.5 K/uL   RBC 3.54 (L) 4.22 - 5.81 MIL/uL   Hemoglobin 10.7 (L) 13.0 - 17.0 g/dL   HCT 32.1 (L) 39.0 - 52.0 %   MCV 90.7 80.0 - 100.0 fL   MCH 30.2 26.0 - 34.0 pg   MCHC 33.3 30.0 - 36.0 g/dL   RDW 13.2 11.5 - 15.5 %   Platelets 105 (L) 150 - 400 K/uL   nRBC 0.0 0.0 - 0.2 %  Hemoglobin A1c  Result Value Ref Range   Hgb A1c MFr Bld 8.1 (H) 4.8 - 5.6 %   Mean Plasma Glucose 185.77 mg/dL  Basic metabolic panel  Result Value Ref Range   Sodium 137 135 -  145 mmol/L   Potassium 4.0 3.5 - 5.1 mmol/L   Chloride 99 98 - 111 mmol/L   CO2 30 22 - 32 mmol/L   Glucose, Bld 312 (H) 70 - 99 mg/dL   BUN 33 (H) 8 - 23 mg/dL   Creatinine, Ser 1.58 (H) 0.61 - 1.24 mg/dL   Calcium 8.6 (L) 8.9 - 10.3 mg/dL   GFR calc non Af Amer 41 (L) >60 mL/min   GFR calc Af Amer 48 (L) >60 mL/min   Anion gap 8 5 - 15  CBC  Result Value Ref Range   WBC 5.4 4.0 - 10.5 K/uL   RBC 3.48 (L) 4.22 - 5.81 MIL/uL   Hemoglobin 10.3 (L) 13.0 - 17.0 g/dL   HCT 31.4 (L) 39.0 - 52.0 %   MCV 90.2 80.0 - 100.0 fL   MCH 29.6 26.0 - 34.0 pg   MCHC 32.8 30.0 - 36.0 g/dL   RDW 13.2 11.5 - 15.5 %   Platelets 105 (L) 150 - 400 K/uL   nRBC 0.0 0.0 - 0.2 %  Magnesium  Result Value Ref Range   Magnesium 2.0 1.7 - 2.4 mg/dL  Glucose, capillary  Result Value Ref Range   Glucose-Capillary 132 (H) 70 - 99 mg/dL  Glucose, capillary  Result Value Ref Range   Glucose-Capillary 136 (H) 70 - 99 mg/dL   Comment 1 Notify RN   Basic metabolic panel  Result Value Ref Range   Sodium  138 135 - 145 mmol/L   Potassium 3.6 3.5 - 5.1 mmol/L   Chloride 99 98 - 111 mmol/L   CO2 29 22 - 32 mmol/L   Glucose, Bld 166 (H) 70 - 99 mg/dL   BUN 42 (H) 8 - 23 mg/dL   Creatinine, Ser 1.89 (H) 0.61 - 1.24 mg/dL   Calcium 8.8 (L) 8.9 - 10.3 mg/dL   GFR calc non Af Amer 33 (L) >60 mL/min   GFR calc Af Amer 38 (L) >60 mL/min   Anion gap 10 5 - 15  CBC  Result Value Ref Range   WBC 5.9 4.0 - 10.5 K/uL   RBC 3.31 (L) 4.22 - 5.81 MIL/uL   Hemoglobin 9.7 (L) 13.0 - 17.0 g/dL   HCT 29.9 (L) 39.0 - 52.0 %   MCV 90.3 80.0 - 100.0 fL   MCH 29.3 26.0 - 34.0 pg   MCHC 32.4 30.0 - 36.0 g/dL   RDW 13.2 11.5 - 15.5 %   Platelets 112 (L) 150 - 400 K/uL   nRBC 0.0 0.0 - 0.2 %  Magnesium  Result Value Ref Range   Magnesium 2.1 1.7 - 2.4 mg/dL  Glucose, capillary  Result Value Ref Range   Glucose-Capillary 152 (H) 70 - 99 mg/dL  Glucose, capillary  Result Value Ref Range   Glucose-Capillary 130 (H) 70 - 99 mg/dL  Glucose, capillary  Result Value Ref Range   Glucose-Capillary 217 (H) 70 - 99 mg/dL   Comment 1 Notify RN   Glucose, capillary  Result Value Ref Range   Glucose-Capillary 124 (H) 70 - 99 mg/dL  Basic metabolic panel  Result Value Ref Range   Sodium 135 135 - 145 mmol/L   Potassium 3.8 3.5 - 5.1 mmol/L   Chloride 98 98 - 111 mmol/L   CO2 28 22 - 32 mmol/L   Glucose, Bld 178 (H) 70 - 99 mg/dL   BUN 42 (H) 8 - 23 mg/dL   Creatinine, Ser 1.47 (H) 0.61 - 1.24 mg/dL  Calcium 9.2 8.9 - 10.3 mg/dL   GFR calc non Af Amer 45 (L) >60 mL/min   GFR calc Af Amer 52 (L) >60 mL/min   Anion gap 9 5 - 15  Glucose, capillary  Result Value Ref Range   Glucose-Capillary 187 (H) 70 - 99 mg/dL  Glucose, capillary  Result Value Ref Range   Glucose-Capillary 188 (H) 70 - 99 mg/dL   Comment 1 Notify RN   Glucose, capillary  Result Value Ref Range   Glucose-Capillary 123 (H) 70 - 99 mg/dL  Glucose, capillary  Result Value Ref Range   Glucose-Capillary 168 (H) 70 - 99 mg/dL  Glucose,  capillary  Result Value Ref Range   Glucose-Capillary 180 (H) 70 - 99 mg/dL  Glucose, capillary  Result Value Ref Range   Glucose-Capillary 142 (H) 70 - 99 mg/dL      Assessment & Plan:   Problem List Items Addressed This Visit    Type 2 diabetes mellitus with diabetic polyneuropathy, without long-term current use of insulin (HCC)    Improved DM A1c 7.4 With some history of hyperglycemia Rare episodes of hypoglycemia, complication Complications - CKD-III, peripheral neuropathy, DM retinopathy, other including hyperlipidemia - increases risk of future cardiovascular complications  Plan:  1. Continue current therapy - Glimepiride 4mg  daily - caution hypoglycemia 2. Encourage improved lifestyle - low carb, low sugar diet, reduce portion size, continue improving regular exercise as tolerated 3. Check CBG, bring log to next visit for review 4. Continue ACEi, Statin 5. Advised to schedule DM ophtho exam, send record 6. Follow-up 6 months      Peripheral vascular disease (HCC)    Significant PAD extremities, previous interventions recently 09/2018 Dr Lucky Cowboy angiography Followed by Vascular Surgery / Cardiology Current foot ulceration healing per Podiatry Continue on medical management      Osteoarthritis of multiple joints   Neuropathy    Chronic problem Gradual worsening Increase Gabapentin up to 3 in AM 1 in afternoon and 2-3 in PM as needed for chronic pain neuropathy      Insomnia   CKD (chronic kidney disease), stage III    Stable to improved CKD-III from prior readings Secondary to DM, HTN, age Avoid NSAIDs regularly' Improve hydration      CHF (congestive heart failure) (HCC)   BPH (benign prostatic hyperplasia)   Atherosclerosis of native arteries of extremity with rest pain [facility])    Other Visit Diagnoses    Annual physical exam    -  Primary      Updated Health Maintenance information Reviewed recent lab results with patient Encouraged improvement to lifestyle  with diet and exercise   No orders of the defined types were placed in this encounter.     Follow up plan: Return in about 6 months (around 05/01/2019) for 6 month DM A1c, HTN, PAD, Pain.  [name], [facility] 10/29/2018, 3:12 PM

## 2018-10-29 NOTE — Patient Instructions (Addendum)
Thank you for coming to the office today.  Increase Gabapentin to see if it helps your nerve pain. Gabapentin 100mg  - take 3 in the morning now, instead of 2, and you can take 1 extra in afternoon, and try to take 2 or 3 at bedtime. - let me know if need more.  Continue to follow up with other specialist  1. Chemistry - Improved Creatinine down to 1.27, kidney function is improved. Still at CKD-III range. Otherwise electrolytes normal. Mild elevated fasting sugar but much improved.   2. Hemoglobin A1c (Diabetes) - 7.4, improved from last reading 8.1   3. PSA Prostate Cancer Screening - 0.7, negative.   4. Cholesterol - Improved, on Atorvastatin.  5. CBC Blood Counts - Improved Hemoglobin up to 11.3, previously 9-10 range.    Please schedule a Follow-up Appointment to: Return in about 6 months (around 05/01/2019) for 6 month DM A1c, HTN, PAD, Pain.  If you have any other questions or concerns, please feel free to call the office or send a message through [place]. You may also schedule an earlier appointment if necessary.  Additionally, you may be receiving a survey about your experience at our office within a few days to 1 week by e-mail or mail. We value your feedback.  [name], DO [place]

## 2018-10-29 NOTE — Assessment & Plan Note (Signed)
Stable to improved CKD-III from prior readings Secondary to DM, HTN, age Avoid NSAIDs regularly' Improve hydration

## 2018-11-04 ENCOUNTER — Ambulatory Visit (INDEPENDENT_AMBULATORY_CARE_PROVIDER_SITE_OTHER): Payer: Medicare Other | Admitting: Vascular Surgery

## 2018-11-04 ENCOUNTER — Other Ambulatory Visit: Payer: Self-pay | Admitting: Family Medicine

## 2018-11-04 ENCOUNTER — Other Ambulatory Visit: Payer: Self-pay

## 2018-11-04 ENCOUNTER — Encounter (INDEPENDENT_AMBULATORY_CARE_PROVIDER_SITE_OTHER): Payer: Self-pay | Admitting: Vascular Surgery

## 2018-11-04 ENCOUNTER — Other Ambulatory Visit (INDEPENDENT_AMBULATORY_CARE_PROVIDER_SITE_OTHER): Payer: Self-pay | Admitting: Vascular Surgery

## 2018-11-04 ENCOUNTER — Ambulatory Visit (INDEPENDENT_AMBULATORY_CARE_PROVIDER_SITE_OTHER): Payer: Medicare Other

## 2018-11-04 VITALS — BP 160/78 | HR 66 | Resp 14 | Ht 71.0 in | Wt 158.0 lb

## 2018-11-04 DIAGNOSIS — I10 Essential (primary) hypertension: Secondary | ICD-10-CM

## 2018-11-04 DIAGNOSIS — I739 Peripheral vascular disease, unspecified: Secondary | ICD-10-CM

## 2018-11-04 DIAGNOSIS — E1142 Type 2 diabetes mellitus with diabetic polyneuropathy: Secondary | ICD-10-CM

## 2018-11-04 DIAGNOSIS — I2510 Atherosclerotic heart disease of native coronary artery without angina pectoris: Secondary | ICD-10-CM | POA: Diagnosis not present

## 2018-11-04 DIAGNOSIS — N401 Enlarged prostate with lower urinary tract symptoms: Secondary | ICD-10-CM

## 2018-11-04 DIAGNOSIS — I7090 Unspecified atherosclerosis: Secondary | ICD-10-CM

## 2018-11-04 DIAGNOSIS — M159 Polyosteoarthritis, unspecified: Secondary | ICD-10-CM

## 2018-11-04 MED ORDER — BACLOFEN 10 MG PO TABS: ORAL_TABLET | ORAL | 3 refills | Status: DC

## 2018-11-05 ENCOUNTER — Telehealth (INDEPENDENT_AMBULATORY_CARE_PROVIDER_SITE_OTHER): Payer: Self-pay

## 2018-11-05 NOTE — Telephone Encounter (Signed)
Spoke with the patient and he is now scheduled with Dr. Lucky Cowboy for 11/12/2018 with a arrival time of 12:00 pm to the [facility]. Patient will do his Covid testing on 11/09/2018 between 12:30-2:30 pm at the [facility]. Pre-procedure instructions were discussed and will be mailed to the patient.

## 2018-11-07 NOTE — Progress Notes (Signed)
MRN : [id number]  Randy Howard is a 78 y.o. (15-Jul-1940) male who presents with chief complaint of  Chief Complaint  Patient presents with   Follow-up  .  History of Present Illness:  The patient returns to the office for followup and review of the noninvasive studies. There has been a significant deterioration in the lower extremity symptoms.  The patient notes interval shortening of their claudication distance and development of mild rest pain symptoms. No new ulcers or wounds have occurred since the last visit.  There have been no significant changes to the patient's overall health care.  The patient denies amaurosis fugax or recent TIA symptoms. There are no recent neurological changes noted. The patient denies history of DVT, PE or superficial thrombophlebitis. The patient denies recent episodes of angina or shortness of breath.    Current Meds  Medication Sig   aspirin EC 81 MG tablet Take 1 tablet (81 mg total) by mouth daily.   atorvastatin (LIPITOR) 10 MG tablet Take 1 tablet (10 mg total) by mouth daily.   baclofen (LIORESAL) 10 MG tablet TAKE 1 TABLET BY MOUTH THREE TIMES DAILYAS NEEDED   benazepril (LOTENSIN) 40 MG tablet TAKE 1 TABLET BY MOUTH ONCE DAILY   busPIRone (BUSPAR) 5 MG tablet TAKE 1 TABLET BY MOUTH ONCE DAILY AS NEEDED.   clopidogrel (PLAVIX) 75 MG tablet TAKE 1 TABLET BY MOUTH ONCE DAILY.   finasteride (PROSCAR) 5 MG tablet TAKE 1 TABLET BY MOUTH ONCE DAILY.   gabapentin (NEURONTIN) 100 MG capsule TAKE 2 CAPSULES BY MOUTH IN THE MORNING AND 1-2 CAPSULES AT BEDTIME.   glimepiride (AMARYL) 4 MG tablet TAKE 1 TABLET BY MOUTH ONCE EVERY MORNING   hydrochlorothiazide (HYDRODIURIL) 25 MG tablet Take 25 mg by mouth daily.   HYDROcodone-acetaminophen (NORCO/VICODIN) 5-325 MG tablet Take 1 tablet by mouth every 4 (four) hours as needed for severe pain.   hydrOXYzine (ATARAX/VISTARIL) 10 MG tablet TAKE 1 TABLET BY MOUTH 3 TIMES DAILY AS NEEDED    Magnesium Oxide, Antacid, 500 MG CAPS Take by mouth.   meclizine (ANTIVERT) 25 MG tablet Take 1 tablet (25 mg total) by mouth daily as needed for dizziness.   metoprolol tartrate (LOPRESSOR) 50 MG tablet TAKE 1 TABLET BY MOUTH TWICE DAILY   mirtazapine (REMERON) 15 MG tablet    nitroGLYCERIN (NITROSTAT) 0.4 MG SL tablet Place 1 tablet (0.4 mg total) under the tongue every 5 (five) minutes as needed for chest pain.   Potassium 99 MG TABS Take by mouth.   tamsulosin (FLOMAX) 0.4 MG CAPS capsule TAKE 1 CAPSULE BY MOUTH ONCE DAILY 30 MINUTES AFTER LARGEST MEAL.   torsemide (DEMADEX) 20 MG tablet TAKE 20 MG BY MOUTH TWICE DAILY FOR 3 DAYS, THEN 20 MG DAILY THEREAFTER   traMADol (ULTRAM) 50 MG tablet Take 1 tablet (50 mg total) by mouth every 12 (twelve) hours as needed.   triamcinolone ointment (KENALOG) 0.1 % Apply topically.    Past Medical History:  Diagnosis Date   Arthritis    Coronary artery disease    Elevated lipids    Hyperlipidemia    Hypertension    Neuropathy    Nocturia    Obstructive sleep apnea    Peripheral vascular disease (HCC)    Prostate enlargement    PVD (peripheral vascular disease) (HCC)    Right carotid bruit    Skin cancer    Followed by Dr. Evorn Gong   Skin cancer    Tremor  Past Surgical History:  Procedure Laterality Date   CARDIAC CATHETERIZATION     CAROTID ARTERY ANGIOPLASTY Left 09/30/2011   CORONARY ARTERY BYPASS GRAFT  2006   ENDARTERECTOMY FEMORAL Bilateral 11/01/2014   Procedure: ENDARTERECTOMY FEMORAL;  Surgeon: Algernon Huxley, MD;  Location: [facility];  Service: Vascular;  Laterality: Bilateral;   LOWER EXTREMITY ANGIOGRAPHY Left 04/26/2018   Procedure: LOWER EXTREMITY ANGIOGRAPHY;  Surgeon: Algernon Huxley, MD;  Location: [place] [facility];  Service: Cardiovascular;  Laterality: Left;   LOWER EXTREMITY ANGIOGRAPHY Right 09/23/2018   Procedure: LOWER EXTREMITY ANGIOGRAPHY;  Surgeon: Algernon Huxley, MD;  Location: [place] [facility];  Service: Cardiovascular;  Laterality: Right;   PERIPHERAL VASCULAR CATHETERIZATION Left 10/09/2014   Procedure: Lower Extremity Angiography;  Surgeon: Algernon Huxley, MD;  Location: [place] [facility];  Service: Cardiovascular;  Laterality: Left;   PERIPHERAL VASCULAR CATHETERIZATION  10/09/2014   Procedure: Lower Extremity Intervention;  Surgeon: Algernon Huxley, MD;  Location: [place] [facility];  Service: Cardiovascular;;    Social History Social History   Tobacco Use   Smoking status: Former Smoker    Packs/day: 1.00    Years: 25.00    Pack years: 25.00    Types: Cigarettes    Quit date: 03/24/1985    Years since quitting: 33.6   Smokeless tobacco: Former Systems developer  Substance Use Topics   Alcohol use: No    Alcohol/week: 0.0 standard drinks   Drug use: No    Family History Family History  Problem Relation Age of Onset   Heart disease Sister    Heart disease Sister    Diabetes Sister    Diabetes Sister    Diabetes Brother     Allergies  Allergen Reactions   Oxycodone Nausea Only     REVIEW OF SYSTEMS (Negative unless checked)  Constitutional: [] Weight loss  [] Fever  [] Chills Cardiac: [] Chest pain   [] Chest pressure   [] Palpitations   [] Shortness of breath when laying flat   [] Shortness of breath with exertion. Vascular:  [x] Pain in legs with walking   [x] Pain in legs at rest  [] History of DVT   [] Phlebitis   [] Swelling in legs   [] Varicose veins   [] Non-healing ulcers Pulmonary:   [] Uses home oxygen   [] Productive cough   [] Hemoptysis   [] Wheeze  [] COPD   [] Asthma Neurologic:  [] Dizziness   [] Seizures   [] History of stroke   [] History of TIA  [] Aphasia   [] Vissual changes   [] Weakness or numbness in arm   [] Weakness or numbness in leg Musculoskeletal:   [] Joint swelling   [] Joint pain   [] Low back pain Hematologic:  [] Easy bruising  [] Easy bleeding   [] Hypercoagulable state   [] Anemic Gastrointestinal:  [] Diarrhea   [] Vomiting  [] Gastroesophageal  reflux/heartburn   [] Difficulty swallowing. Genitourinary:  [] Chronic kidney disease   [] Difficult urination  [] Frequent urination   [] Blood in urine Skin:  [] Rashes   [] Ulcers  Psychological:  [] History of anxiety   []  History of major depression.  Physical Examination  Vitals:   11/04/18 1528  BP: (!) 160/78  Pulse: 66  Resp: 14  Weight: 158 lb (71.7 kg)  Height: 5\' 11"  (1.803 m)   Body mass index is 22.04 kg/m. Gen: WD/WN, NAD Head: [place]/AT, No temporalis wasting.  Ear/Nose/Throat: Hearing grossly intact, nares w/o erythema or drainage Eyes: PER, EOMI, sclera nonicteric.  Neck: Supple, no large masses.   Pulmonary:  Good air movement, no audible wheezing bilaterally, no use  of accessory muscles.  Cardiac: RRR, no JVD Vascular:  Vessel Right Left  PT Not Palpable Not Palpable  DP Not Palpable Not Palpable  Gastrointestinal: Non-distended. No guarding/no peritoneal signs.  Musculoskeletal: M/S 5/5 throughout.  No deformity or atrophy.  Neurologic: CN 2-12 intact. Symmetrical.  Speech is fluent. Motor exam as listed above. Psychiatric: Judgment intact, Mood & affect appropriate for pt's clinical situation. Dermatologic: No rashes or ulcers noted.  No changes consistent with cellulitis. Lymph : No lichenification or skin changes of chronic lymphedema.  CBC Lab Results  Component Value Date   WBC 5.5 10/25/2018   HGB 11.3 (L) 10/25/2018   HCT 34.5 (L) 10/25/2018   MCV 92.7 10/25/2018   PLT 148 10/25/2018    BMET    Component Value Date/Time   NA 143 10/25/2018 0917   NA 142 07/30/2013   NA 141 01/05/2012 0724   K 3.9 10/25/2018 0917   K 3.8 01/05/2012 0724   CL 105 10/25/2018 0917   CL 106 01/05/2012 0724   CO2 28 10/25/2018 0917   CO2 28 01/05/2012 0724   GLUCOSE 114 (H) 10/25/2018 0917   GLUCOSE 152 (H) 01/05/2012 0724   BUN 21 10/25/2018 0917   BUN 15 07/30/2013   BUN 24 (H) 01/05/2012 0724   CREATININE 1.27 (H) 10/25/2018 0917   CALCIUM 9.4 10/25/2018  0917   CALCIUM 9.0 01/05/2012 0724   GFRNONAA 53 (L) 10/25/2018 0917   GFRAA 62 10/25/2018 0917   Estimated Creatinine Clearance: 47.8 mL/min (A) (by C-G formula based on SCr of 1.27 mg/dL (H)).  COAG Lab Results  Component Value Date   INR 1.21 10/24/2014    Radiology Vas [place] Burnard Bunting With/wo Tbi  Result Date: 11/04/2018 LOWER EXTREMITY DOPPLER STUDY Indications: Peripheral artery disease, and Patient complaint of bilateral foot              sores(left>right) and bilateral finger numbness.  Vascular Interventions: 09/30/06: Right SFA PTA;                         08/05/07: Left SFA stent with left popliteal artery PTA;                         01/05/12: Right SFA/popliteal artery PTA;                         11/01/14: Bilateral CFA, PFA & SFA                         endarterectomies/angioplasties;                         04/26/18: Left SFA/popliteal artery PTA/stent x2 with left                         TP trunk & peroneal artery PTAs;                          09/23/2018: Aortogram and Selective Right Lower                         Extremity Angiogram. PTA of the Right PFA. Performing Technologist: Almira Coaster RVS  Examination Guidelines: A complete evaluation includes at minimum, Doppler waveform signals and systolic blood pressure reading at  the level of bilateral brachial, anterior tibial, and posterior tibial arteries, when vessel segments are accessible. Bilateral testing is considered an integral part of a complete examination. Photoelectric Plethysmograph (PPG) waveforms and toe systolic pressure readings are included as required and additional duplex testing as needed. Limited examinations for reoccurring indications may be performed as noted.  ABI Findings: +---------+------------------+-----+----------+--------+  Right     Rt Pressure (mmHg) Index Waveform   Comment   +---------+------------------+-----+----------+--------+  Brachial  159                                            +---------+------------------+-----+----------+--------+  ATA       85                 0.53  monophasic           +---------+------------------+-----+----------+--------+  PTA       91                 0.57  monophasic           +---------+------------------+-----+----------+--------+  Great Toe 57                 0.35  Abnormal             +---------+------------------+-----+----------+--------+ +---------+------------------+-----+----------+-------+  Left      Lt Pressure (mmHg) Index Waveform   Comment  +---------+------------------+-----+----------+-------+  Brachial  161                                          +---------+------------------+-----+----------+-------+  ATA       170                1.06  triphasic           +---------+------------------+-----+----------+-------+  PTA       76                 0.47  monophasic          +---------+------------------+-----+----------+-------+  Great Toe 108                0.67  Abnormal            +---------+------------------+-----+----------+-------+ +-------+-----------+-----------+------------+------------+  ABI/TBI Today's ABI Today's TBI Previous ABI Previous TBI  +-------+-----------+-----------+------------+------------+  Right   .57         .35         .49          .41           +-------+-----------+-----------+------------+------------+  Left    1.06        .67         .73          .49           +-------+-----------+-----------+------------+------------+ Bilateral ABIs appear increased compared to prior study on 09/10/2018. Right TBIs appear decreased compared to prior study on 09/10/2018.  Summary: Right: Resting right ankle-brachial index indicates moderate right lower extremity arterial disease. The right toe-brachial index is abnormal. Left: Resting left ankle-brachial index is within normal range. No evidence of significant left lower extremity arterial disease. The left toe-brachial index is abnormal.  *See table(s) above for measurements and  observations.  Electronically signed by Hortencia Pilar MD on 11/04/2018 at 5:01:27 PM.  Final      Assessment/Plan 1. Atherosclerotic vascular disease Recommend:  The patient is status post successful angiogram with intervention.  The patient reports that the claudication symptoms and leg pain are unchanged and continue to be a major issue.   The patient continues to voice lifestyle limiting changes at this point in time.  Patient should undergo angiography of the right leg.   In the mean time the patient should continue walking and an exercise program.  The patient should continue antiplatelet therapy and aggressive treatment of the lipid abnormalities  Smoking cessation was again discussed  The patient should continue wearing graduated compression socks 10-15 mmHg strength to control the mild edema.    2. Arteriosclerosis of coronary artery Continue cardiac and antihypertensive medications as already ordered and reviewed, no changes at this time.  Continue statin as ordered and reviewed, no changes at this time  Nitrates PRN for chest pain   3. Essential hypertension Continue antihypertensive medications as already ordered, these medications have been reviewed and there are no changes at this time.   4. Type 2 diabetes mellitus with diabetic polyneuropathy, without long-term current use of insulin (HCC) Continue hypoglycemic medications as already ordered, these medications have been reviewed and there are no changes at this time.  Hgb A1C to be monitored as already arranged by primary service    Hortencia Pilar, MD  11/07/2018 8:57 PM

## 2018-11-09 ENCOUNTER — Other Ambulatory Visit (INDEPENDENT_AMBULATORY_CARE_PROVIDER_SITE_OTHER): Payer: Self-pay | Admitting: Nurse Practitioner

## 2018-11-09 ENCOUNTER — Other Ambulatory Visit: Payer: Self-pay

## 2018-11-09 ENCOUNTER — Other Ambulatory Visit
Admission: RE | Admit: 2018-11-09 | Discharge: 2018-11-09 | Disposition: A | Discharge status: Home / Self Care | Payer: Medicare Other | Source: Ambulatory Visit | Attending: Vascular Surgery | Admitting: Vascular Surgery

## 2018-11-09 DIAGNOSIS — Z20828 Contact with and (suspected) exposure to other viral communicable diseases: Secondary | ICD-10-CM | POA: Insufficient documentation

## 2018-11-09 DIAGNOSIS — Z01812 Encounter for preprocedural laboratory examination: Secondary | ICD-10-CM | POA: Diagnosis not present

## 2018-11-10 LAB — SARS CORONAVIRUS 2 (TAT 6-24 HRS): SARS Coronavirus 2: NEGATIVE

## 2018-11-11 ENCOUNTER — Other Ambulatory Visit (INDEPENDENT_AMBULATORY_CARE_PROVIDER_SITE_OTHER): Payer: Self-pay | Admitting: Nurse Practitioner

## 2018-11-12 ENCOUNTER — Observation Stay
Admission: RE | Admit: 2018-11-12 | Discharge: 2018-11-13 | Disposition: A | Discharge status: Home / Self Care | Payer: Medicare Other | Attending: Vascular Surgery | Admitting: Vascular Surgery

## 2018-11-12 ENCOUNTER — Other Ambulatory Visit: Payer: Self-pay

## 2018-11-12 ENCOUNTER — Encounter: Payer: Self-pay | Admitting: Certified Registered"

## 2018-11-12 ENCOUNTER — Encounter: Payer: Self-pay | Admitting: *Deleted

## 2018-11-12 ENCOUNTER — Encounter
Admission: RE | Disposition: A | Discharge status: Home / Self Care | Payer: Self-pay | Source: Home / Self Care | Attending: Vascular Surgery

## 2018-11-12 DIAGNOSIS — G4733 Obstructive sleep apnea (adult) (pediatric): Secondary | ICD-10-CM | POA: Diagnosis not present

## 2018-11-12 DIAGNOSIS — I10 Essential (primary) hypertension: Secondary | ICD-10-CM | POA: Diagnosis not present

## 2018-11-12 DIAGNOSIS — Z79899 Other long term (current) drug therapy: Secondary | ICD-10-CM | POA: Insufficient documentation

## 2018-11-12 DIAGNOSIS — E785 Hyperlipidemia, unspecified: Secondary | ICD-10-CM | POA: Insufficient documentation

## 2018-11-12 DIAGNOSIS — I2510 Atherosclerotic heart disease of native coronary artery without angina pectoris: Secondary | ICD-10-CM | POA: Insufficient documentation

## 2018-11-12 DIAGNOSIS — Z7984 Long term (current) use of oral hypoglycemic drugs: Secondary | ICD-10-CM | POA: Diagnosis not present

## 2018-11-12 DIAGNOSIS — Z87891 Personal history of nicotine dependence: Secondary | ICD-10-CM | POA: Diagnosis not present

## 2018-11-12 DIAGNOSIS — I70221 Atherosclerosis of native arteries of extremities with rest pain, right leg: Principal | ICD-10-CM | POA: Insufficient documentation

## 2018-11-12 DIAGNOSIS — I739 Peripheral vascular disease, unspecified: Secondary | ICD-10-CM

## 2018-11-12 DIAGNOSIS — Z7902 Long term (current) use of antithrombotics/antiplatelets: Secondary | ICD-10-CM | POA: Insufficient documentation

## 2018-11-12 DIAGNOSIS — Z7982 Long term (current) use of aspirin: Secondary | ICD-10-CM | POA: Diagnosis not present

## 2018-11-12 DIAGNOSIS — E1140 Type 2 diabetes mellitus with diabetic neuropathy, unspecified: Secondary | ICD-10-CM | POA: Insufficient documentation

## 2018-11-12 DIAGNOSIS — E1151 Type 2 diabetes mellitus with diabetic peripheral angiopathy without gangrene: Secondary | ICD-10-CM | POA: Diagnosis not present

## 2018-11-12 HISTORY — DX: Dyspnea, unspecified: R06.00

## 2018-11-12 HISTORY — PX: LOWER EXTREMITY ANGIOGRAPHY: CATH118251

## 2018-11-12 LAB — CBC
HCT: 35.4 % — ABNORMAL LOW (ref 39.0–52.0)
Hemoglobin: 11.5 g/dL — ABNORMAL LOW (ref 13.0–17.0)
MCH: 30.5 pg (ref 26.0–34.0)
MCHC: 32.5 g/dL (ref 30.0–36.0)
MCV: 93.9 fL (ref 80.0–100.0)
Platelets: 145 10*3/uL — ABNORMAL LOW (ref 150–400)
RBC: 3.77 MIL/uL — ABNORMAL LOW (ref 4.22–5.81)
RDW: 12.7 % (ref 11.5–15.5)
WBC: 7.2 10*3/uL (ref 4.0–10.5)
nRBC: 0 % (ref 0.0–0.2)

## 2018-11-12 LAB — BASIC METABOLIC PANEL
Anion gap: 12 (ref 5–15)
BUN: 21 mg/dL (ref 8–23)
CO2: 28 mmol/L (ref 22–32)
Calcium: 9.2 mg/dL (ref 8.9–10.3)
Chloride: 99 mmol/L (ref 98–111)
Creatinine, Ser: 1.32 mg/dL — ABNORMAL HIGH (ref 0.61–1.24)
GFR calc Af Amer: 59 mL/min — ABNORMAL LOW (ref >60)
GFR calc non Af Amer: 51 mL/min — ABNORMAL LOW (ref >60)
Glucose, Bld: 117 mg/dL — ABNORMAL HIGH (ref 70–99)
Potassium: 3.6 mmol/L (ref 3.5–5.1)
Sodium: 139 mmol/L (ref 135–145)

## 2018-11-12 LAB — CREATININE, SERUM
Creatinine, Ser: 1.15 mg/dL (ref 0.61–1.24)
GFR calc Af Amer: >60 mL/min (ref >60)
GFR calc non Af Amer: >60 mL/min (ref >60)

## 2018-11-12 LAB — GLUCOSE, CAPILLARY: Glucose-Capillary: 91 mg/dL (ref 70–99)

## 2018-11-12 SURGERY — LOWER EXTREMITY ANGIOGRAPHY
Anesthesia: Moderate Sedation | Laterality: Right

## 2018-11-12 MED ORDER — HYDROXYZINE HCL 10 MG PO TABS
10.0000 mg | ORAL_TABLET | Freq: Three times a day (TID) | ORAL | Status: DC | PRN
  Filled 2018-11-12: qty 1

## 2018-11-12 MED ORDER — METHYLPREDNISOLONE SODIUM SUCC 125 MG IJ SOLR: 125.0000 mg | Freq: Once | INTRAMUSCULAR | Status: DC | PRN

## 2018-11-12 MED ORDER — HEPARIN SODIUM (PORCINE) 5000 UNIT/ML IJ SOLN
5000.0000 [IU] | Freq: Three times a day (TID) | INTRAMUSCULAR | Status: DC
  Filled 2018-11-12: qty 1

## 2018-11-12 MED ORDER — ATORVASTATIN CALCIUM 10 MG PO TABS
10.0000 mg | ORAL_TABLET | Freq: Every day | ORAL | Status: DC
  Administered 2018-11-12: 21:00:00 10 mg via ORAL
  Filled 2018-11-12: qty 1

## 2018-11-12 MED ORDER — GLIMEPIRIDE 1 MG PO TABS
1.0000 mg | ORAL_TABLET | Freq: Every day | ORAL | Status: DC
  Administered 2018-11-13: 1 mg via ORAL
  Filled 2018-11-12: qty 1

## 2018-11-12 MED ORDER — DIPHENHYDRAMINE HCL 50 MG/ML IJ SOLN: 50.0000 mg | Freq: Once | INTRAMUSCULAR | Status: DC | PRN

## 2018-11-12 MED ORDER — MAGNESIUM HYDROXIDE 400 MG/5ML PO SUSP: 30.0000 mL | Freq: Every day | ORAL | Status: DC | PRN

## 2018-11-12 MED ORDER — ACETAMINOPHEN 325 MG PO TABS: 650.0000 mg | ORAL_TABLET | Freq: Four times a day (QID) | ORAL | Status: DC | PRN

## 2018-11-12 MED ORDER — FLEET ENEMA 7-19 GM/118ML RE ENEM: 1.0000 | ENEMA | Freq: Once | RECTAL | Status: DC | PRN

## 2018-11-12 MED ORDER — NITROGLYCERIN 0.4 MG SL SUBL: 0.4000 mg | SUBLINGUAL_TABLET | SUBLINGUAL | Status: DC | PRN

## 2018-11-12 MED ORDER — ONDANSETRON HCL 4 MG/2ML IJ SOLN: 4.0000 mg | Freq: Four times a day (QID) | INTRAMUSCULAR | Status: DC | PRN

## 2018-11-12 MED ORDER — CLOPIDOGREL BISULFATE 75 MG PO TABS
75.0000 mg | ORAL_TABLET | Freq: Every day | ORAL | Status: DC
  Administered 2018-11-12 – 2018-11-13 (×2): 75 mg via ORAL
  Filled 2018-11-12 (×2): qty 1

## 2018-11-12 MED ORDER — BENAZEPRIL HCL 20 MG PO TABS
40.0000 mg | ORAL_TABLET | Freq: Every day | ORAL | Status: DC
  Administered 2018-11-13: 40 mg via ORAL
  Filled 2018-11-12: qty 2

## 2018-11-12 MED ORDER — ASPIRIN EC 81 MG PO TBEC: 81.0000 mg | DELAYED_RELEASE_TABLET | Freq: Every day | ORAL | Status: DC

## 2018-11-12 MED ORDER — SODIUM CHLORIDE 0.9 % IV SOLN
INTRAVENOUS | Status: DC
  Administered 2018-11-12: 13:00:00 via INTRAVENOUS

## 2018-11-12 MED ORDER — HEPARIN SODIUM (PORCINE) 1000 UNIT/ML IJ SOLN
INTRAMUSCULAR | Status: AC
  Filled 2018-11-12: qty 1

## 2018-11-12 MED ORDER — TAMSULOSIN HCL 0.4 MG PO CAPS: 0.4000 mg | ORAL_CAPSULE | Freq: Every day | ORAL | Status: DC

## 2018-11-12 MED ORDER — MIDAZOLAM HCL 2 MG/2ML IJ SOLN
INTRAMUSCULAR | Status: DC | PRN
  Administered 2018-11-12 (×2): 1 mg via INTRAVENOUS
  Administered 2018-11-12: 2 mg via INTRAVENOUS

## 2018-11-12 MED ORDER — CEFAZOLIN SODIUM-DEXTROSE 2-4 GM/100ML-% IV SOLN: 2.0000 g | Freq: Once | INTRAVENOUS | Status: DC

## 2018-11-12 MED ORDER — ASPIRIN EC 81 MG PO TBEC
81.0000 mg | DELAYED_RELEASE_TABLET | Freq: Every day | ORAL | Status: DC
  Administered 2018-11-12 – 2018-11-13 (×2): 81 mg via ORAL
  Filled 2018-11-12 (×2): qty 1

## 2018-11-12 MED ORDER — MORPHINE SULFATE (PF) 4 MG/ML IV SOLN: 2.0000 mg | INTRAVENOUS | Status: DC | PRN

## 2018-11-12 MED ORDER — CEFAZOLIN SODIUM-DEXTROSE 2-4 GM/100ML-% IV SOLN
INTRAVENOUS | Status: AC
  Administered 2018-11-12: 2 g
  Filled 2018-11-12: qty 100

## 2018-11-12 MED ORDER — FINASTERIDE 5 MG PO TABS
5.0000 mg | ORAL_TABLET | Freq: Every day | ORAL | Status: DC
  Administered 2018-11-13: 12:00:00 5 mg via ORAL
  Filled 2018-11-12: qty 1

## 2018-11-12 MED ORDER — HYDROCODONE-ACETAMINOPHEN 5-325 MG PO TABS: 1.0000 | ORAL_TABLET | ORAL | Status: DC | PRN

## 2018-11-12 MED ORDER — BUSPIRONE HCL 5 MG PO TABS
5.0000 mg | ORAL_TABLET | Freq: Two times a day (BID) | ORAL | Status: DC
  Administered 2018-11-12 – 2018-11-13 (×2): 5 mg via ORAL
  Filled 2018-11-12 (×3): qty 1

## 2018-11-12 MED ORDER — IODIXANOL 320 MG/ML IV SOLN
INTRAVENOUS | Status: DC | PRN
  Administered 2018-11-12: 70 mL via INTRA_ARTERIAL

## 2018-11-12 MED ORDER — HYDROMORPHONE HCL 1 MG/ML IJ SOLN: 1.0000 mg | Freq: Once | INTRAMUSCULAR | Status: DC | PRN

## 2018-11-12 MED ORDER — MIDAZOLAM HCL 2 MG/ML PO SYRP: 8.0000 mg | ORAL_SOLUTION | Freq: Once | ORAL | Status: DC | PRN

## 2018-11-12 MED ORDER — GABAPENTIN 100 MG PO CAPS
200.0000 mg | ORAL_CAPSULE | Freq: Two times a day (BID) | ORAL | Status: DC
  Administered 2018-11-12 – 2018-11-13 (×2): 200 mg via ORAL
  Filled 2018-11-12 (×2): qty 2

## 2018-11-12 MED ORDER — SORBITOL 70 % SOLN
30.0000 mL | Freq: Every day | Status: DC | PRN
  Filled 2018-11-12: qty 30

## 2018-11-12 MED ORDER — FENTANYL CITRATE (PF) 100 MCG/2ML IJ SOLN
INTRAMUSCULAR | Status: AC
  Filled 2018-11-12: qty 4

## 2018-11-12 MED ORDER — DOCUSATE SODIUM 100 MG PO CAPS
100.0000 mg | ORAL_CAPSULE | Freq: Two times a day (BID) | ORAL | Status: DC
  Administered 2018-11-12: 100 mg via ORAL
  Filled 2018-11-12 (×2): qty 1

## 2018-11-12 MED ORDER — FAMOTIDINE 20 MG PO TABS: 40.0000 mg | ORAL_TABLET | Freq: Once | ORAL | Status: DC | PRN

## 2018-11-12 MED ORDER — ONDANSETRON HCL 4 MG PO TABS: 4.0000 mg | ORAL_TABLET | Freq: Four times a day (QID) | ORAL | Status: DC | PRN

## 2018-11-12 MED ORDER — DEXTROSE-NACL 5-0.9 % IV SOLN
INTRAVENOUS | Status: DC
  Administered 2018-11-12: 18:00:00 via INTRAVENOUS

## 2018-11-12 MED ORDER — MIRTAZAPINE 15 MG PO TABS
15.0000 mg | ORAL_TABLET | Freq: Every day | ORAL | Status: DC
  Administered 2018-11-12: 15 mg via ORAL
  Filled 2018-11-12: qty 1

## 2018-11-12 MED ORDER — TRAMADOL HCL 50 MG PO TABS
50.0000 mg | ORAL_TABLET | Freq: Two times a day (BID) | ORAL | Status: DC | PRN
  Administered 2018-11-12: 50 mg via ORAL
  Filled 2018-11-12: qty 1

## 2018-11-12 MED ORDER — METOPROLOL TARTRATE 50 MG PO TABS
50.0000 mg | ORAL_TABLET | Freq: Two times a day (BID) | ORAL | Status: DC
  Administered 2018-11-12 – 2018-11-13 (×2): 50 mg via ORAL
  Filled 2018-11-12 (×2): qty 1

## 2018-11-12 MED ORDER — HEPARIN SODIUM (PORCINE) 1000 UNIT/ML IJ SOLN
INTRAMUSCULAR | Status: DC | PRN
  Administered 2018-11-12: 5000 [IU] via INTRAVENOUS

## 2018-11-12 MED ORDER — MIDAZOLAM HCL 5 MG/5ML IJ SOLN
INTRAMUSCULAR | Status: AC
  Filled 2018-11-12: qty 5

## 2018-11-12 MED ORDER — MECLIZINE HCL 25 MG PO TABS
25.0000 mg | ORAL_TABLET | Freq: Every day | ORAL | Status: DC | PRN
  Filled 2018-11-12: qty 1

## 2018-11-12 MED ORDER — HYDROCHLOROTHIAZIDE 25 MG PO TABS: 25.0000 mg | ORAL_TABLET | Freq: Every day | ORAL | Status: DC

## 2018-11-12 MED ORDER — ACETAMINOPHEN 650 MG RE SUPP: 650.0000 mg | Freq: Four times a day (QID) | RECTAL | Status: DC | PRN

## 2018-11-12 MED ORDER — FENTANYL CITRATE (PF) 100 MCG/2ML IJ SOLN
INTRAMUSCULAR | Status: DC | PRN
  Administered 2018-11-12: 50 ug via INTRAVENOUS
  Administered 2018-11-12: 25 ug via INTRAVENOUS

## 2018-11-12 MED ORDER — MAGNESIUM OXIDE 400 (241.3 MG) MG PO TABS
400.0000 mg | ORAL_TABLET | Freq: Every day | ORAL | Status: AC
  Administered 2018-11-12: 400 mg via ORAL
  Filled 2018-11-12: qty 1

## 2018-11-12 MED ORDER — CLOPIDOGREL BISULFATE 75 MG PO TABS: 75.0000 mg | ORAL_TABLET | Freq: Every day | ORAL | Status: DC

## 2018-11-12 SURGICAL SUPPLY — 27 items
BALLN LUTONIX DCB 5X40X130 (BALLOONS) ×2
BALLN ULTRVRSE 2X300X150 (BALLOONS) ×1
BALLN ULTRVRSE 2X300X150 OTW (BALLOONS) ×1
BALLOON LUTONIX DCB 5X40X130 (BALLOONS) ×1 IMPLANT
BALLOON ULTRVRSE 2X300X150 OTW (BALLOONS) ×1 IMPLANT
CATH BEACON 5 .035 65 RIM TIP (CATHETERS) ×2 IMPLANT
CATH BEACON 5 .038 100 VERT TP (CATHETERS) ×2 IMPLANT
CATH CXI SUPP 2.6F 150 ANG (CATHETERS) ×2 IMPLANT
CATH CXI SUPP ANG 4FR 135 (CATHETERS) ×1 IMPLANT
CATH CXI SUPP ANG 4FR 135CM (CATHETERS) ×2
CATH PIG 70CM (CATHETERS) ×2 IMPLANT
CATH SEEKER .018X150 (CATHETERS) ×2 IMPLANT
CATH SEEKER .035X135CM (CATHETERS) ×2 IMPLANT
COVER PROBE U/S 5X48 (MISCELLANEOUS) ×2 IMPLANT
DEVICE PRESTO INFLATION (MISCELLANEOUS) ×2 IMPLANT
DEVICE STARCLOSE SE CLOSURE (Vascular Products) ×2 IMPLANT
DEVICE TORQUE .014-.018 (MISCELLANEOUS) ×1 IMPLANT
GLIDEWIRE ADV .035X260CM (WIRE) ×2 IMPLANT
GUIDEWIRE PFTE-COATED .018X300 (WIRE) ×2 IMPLANT
PACK ANGIOGRAPHY (CUSTOM PROCEDURE TRAY) ×2 IMPLANT
SHEATH ANL2 6FRX45 HC (SHEATH) ×2 IMPLANT
SHEATH BRITE TIP 5FRX11 (SHEATH) ×2 IMPLANT
SYR MEDRAD MARK 7 150ML (SYRINGE) ×2 IMPLANT
TORQUE DEVICE .014-.018 (MISCELLANEOUS) ×2
TUBING CONTRAST HIGH PRESS 72 (TUBING) ×2 IMPLANT
WIRE G V18X300CM (WIRE) ×2 IMPLANT
WIRE J 3MM .035X145CM (WIRE) ×2 IMPLANT

## 2018-11-12 NOTE — H&P (Signed)
[facility] History & Physical Update  The patient was interviewed and re-examined.  The patient's previous History and Physical has been reviewed and is unchanged.  There is no change in the plan of care. We plan to proceed with the scheduled procedure.  Leotis Pain, MD  11/12/2018, 1:33 PM

## 2018-11-12 NOTE — Op Note (Signed)
[facility]  Percutaneous Study/Intervention Procedural Note   Date of Surgery: 11/12/2018  Surgeon(s):DEW,JASON    Assistants:none  Pre-operative Diagnosis: PAD with rest pain right foot  Post-operative diagnosis:  Same  Procedure(s) Performed:             1.  Ultrasound guidance for vascular access left femoral artery             2.  Catheter placement into right femoral artery from left femoral approach             3.  Aortogram and selective right lower extremity angiogram             4.  Percutaneous transluminal angioplasty of the tibioperoneal trunk with 2 mm diameter angioplasty balloon             5.   Percutaneous transluminal angioplasty of the right profunda femoris artery with 5 mm diameter by 4 cm length Lutonix drug-coated angioplasty balloon  6.   StarClose closure device left femoral artery  EBL:  5 cc  Contrast: 70 cc  Fluoro Time: 23.8 minutes  Moderate Conscious Sedation Time: approximately 65 minutes using 4 mg of Versed and 75 mcg of Fentanyl              Indications:  Patient is a 78 y.o.male with persistent pain in his right foot and known severe peripheral arterial disease. The patient has noninvasive study showing some mild improvement in his ABI on the right after an angioplasty of his profunda femoris artery couple of months ago.  His digit pressures remain poor and he continues to have pain. The patient is brought in for angiography for further evaluation and potential treatment.  Due to the limb threatening nature of the situation, angiogram was performed for attempted limb salvage. The patient is aware that if the procedure fails, amputation would be expected.  The patient also understands that even with successful revascularization, amputation may still be required due to the severity of the situation.  The risks and benefits are discussed and informed consent is obtained.   Procedure:  The patient was identified and appropriate  procedural time out was performed.  The patient was then placed supine on the table and prepped and draped in the usual sterile fashion. Moderate conscious sedation was administered during a face to face encounter with the patient throughout the procedure with my supervision of the RN administering medicines and monitoring the patient's vital signs, pulse oximetry, telemetry and mental status throughout from the start of the procedure until the patient was taken to the recovery room. Ultrasound was used to evaluate the left common femoral artery.  It was patent .  A digital ultrasound image was acquired.  A Seldinger needle was used to access the left common femoral artery under direct ultrasound guidance and a permanent image was performed.  A 0.035 J wire was advanced without resistance and a 5Fr sheath was placed.  Pigtail catheter was placed into the aorta and an AP aortogram was performed. This demonstrated markedly tortuous and calcified iliac arteries with mild stenosis particularly in the left iliac system in the 30% range.  The aorta was ectatic and calcified but not stenotic.  The left renal artery was seen to be patent.  The right renal artery was not particularly well visualized but the catheter position may have been slightly lower than the renal artery. I then crossed the aortic bifurcation and advanced to the right femoral head. Selective  right lower extremity angiogram was then performed. This demonstrated occlusion of the SFA some 4 to 5 cm beyond its origin with reconstitution of a diseased popliteal artery above the knee.  There was then occlusion of the distal popliteal artery and the tibioperoneal trunk with reconstitution of what appeared to be a peroneal artery distally.  The other 2 tibial vessels were not seen.  There was also about a 60 to 70% stenosis of the profunda femoris artery about 2 cm beyond the origin in the area that was previously treated.  This had recurred but not nearly as  severe as prior to treatment earlier this year. It was felt that it was in the patient's best interest to proceed with intervention after these images to avoid a second procedure and a larger amount of contrast and fluoroscopy based off of the findings from the initial angiogram. The patient was systemically heparinized and a 6 [place] Ansell sheath was then placed over the [employer] wire. I then used a Kumpe catheter and the advantage wire to get down into the SFA.  Tediously, after exchanging for a CXI then a seeker catheter I was able to get down into the tibioperoneal trunk.  We were still in a subintimal plane down to this level and the actual peroneal artery was then visualized as we had just gotten into the lumen.  There was a very steep angle to get back into the peroneal artery and despite trying a litany of wires this was very difficult to get into.  We tried a 0.035 and a 0.018 advantage wire, a V 18 wire, and catheters including CXI catheters and seeker catheters.  The Kumpe catheter would not track through the calcified SFA to give a better angle.  I did use a 2 mm diameter by 30 cm length angioplasty balloon taken down to the tibioperoneal trunk and inflated to 14 atm for 1 minute.  Despite this, I was never able to get a wire into the true peroneal artery and there was clearly a large chunk of plaque that was impossible to navigate around.  Without appropriate outflow, I did not feel like intervention on the SFA or popliteal arteries would likely be of any benefit.  I did feel like retreating the profunda femoris artery may be of some benefit to improve collateral blood flow distally.  Using a Kumpe catheter and a 0.035 advantage wire I pulled back up to the common femoral artery and advanced across the profunda femoris artery without difficulty confirming intraluminal flow below the stenosis.  The catheter was then removed and a 5 mm diameter by 4 cm length Lutonix drug-coated angioplasty  balloon was inflated to 12 atm in the right profunda femoris artery.  Following this there was about a 20 to 30% residual stenosis that was not flow-limiting.  We had already used a significant amount of contrast in a patient with chronic kidney disease and over 20 minutes of fluoroscopy soft thought that further attempts today were futile.  Consideration for repeat attempt in the future can be given. I elected to terminate the procedure. The sheath was removed and StarClose closure device was deployed in the left femoral artery with excellent hemostatic result. The patient was taken to the recovery room in stable condition having tolerated the procedure well.  Findings:               Aortogram:  This demonstrated markedly tortuous and calcified iliac arteries with mild stenosis particularly in the left iliac  system in the 30% range.  The aorta was ectatic and calcified but not stenotic.  The left renal artery was seen to be patent.  The right renal artery was not particularly well visualized but the catheter position may have been slightly lower than the renal artery.             Right lower Extremity:  This demonstrated occlusion of the SFA some 4 to 5 cm beyond its origin with reconstitution of a diseased popliteal artery above the knee.  There was then occlusion of the distal popliteal artery and the tibioperoneal trunk with reconstitution of what appeared to be a peroneal artery distally.  The other 2 tibial vessels were not seen.  There was also about a 60 to 70% stenosis of the profunda femoris artery about 2 cm beyond the origin in the area that was previously treated.  This had recurred but not nearly as severe as prior to treatment earlier this year.   Disposition: Patient was taken to the recovery room in stable condition having tolerated the procedure well.  Complications: None  Leotis Pain 11/12/2018 3:09 PM   This note was created with Dragon Medical transcription system. Any errors in  dictation are purely unintentional.

## 2018-11-13 DIAGNOSIS — Z87891 Personal history of nicotine dependence: Secondary | ICD-10-CM | POA: Diagnosis not present

## 2018-11-13 DIAGNOSIS — Z7984 Long term (current) use of oral hypoglycemic drugs: Secondary | ICD-10-CM | POA: Diagnosis not present

## 2018-11-13 DIAGNOSIS — I70221 Atherosclerosis of native arteries of extremities with rest pain, right leg: Secondary | ICD-10-CM | POA: Diagnosis not present

## 2018-11-13 DIAGNOSIS — E1140 Type 2 diabetes mellitus with diabetic neuropathy, unspecified: Secondary | ICD-10-CM | POA: Diagnosis not present

## 2018-11-13 DIAGNOSIS — I10 Essential (primary) hypertension: Secondary | ICD-10-CM | POA: Diagnosis not present

## 2018-11-13 DIAGNOSIS — Z7902 Long term (current) use of antithrombotics/antiplatelets: Secondary | ICD-10-CM | POA: Diagnosis not present

## 2018-11-13 DIAGNOSIS — E1151 Type 2 diabetes mellitus with diabetic peripheral angiopathy without gangrene: Secondary | ICD-10-CM | POA: Diagnosis not present

## 2018-11-13 DIAGNOSIS — I2510 Atherosclerotic heart disease of native coronary artery without angina pectoris: Secondary | ICD-10-CM | POA: Diagnosis not present

## 2018-11-13 DIAGNOSIS — Z7982 Long term (current) use of aspirin: Secondary | ICD-10-CM | POA: Diagnosis not present

## 2018-11-13 DIAGNOSIS — Z79899 Other long term (current) drug therapy: Secondary | ICD-10-CM | POA: Diagnosis not present

## 2018-11-13 DIAGNOSIS — E785 Hyperlipidemia, unspecified: Secondary | ICD-10-CM | POA: Diagnosis not present

## 2018-11-13 DIAGNOSIS — G4733 Obstructive sleep apnea (adult) (pediatric): Secondary | ICD-10-CM | POA: Diagnosis not present

## 2018-11-13 LAB — CBC
HCT: 31.2 % — ABNORMAL LOW (ref 39.0–52.0)
Hemoglobin: 10.2 g/dL — ABNORMAL LOW (ref 13.0–17.0)
MCH: 30.1 pg (ref 26.0–34.0)
MCHC: 32.7 g/dL (ref 30.0–36.0)
MCV: 92 fL (ref 80.0–100.0)
Platelets: 143 10*3/uL — ABNORMAL LOW (ref 150–400)
RBC: 3.39 MIL/uL — ABNORMAL LOW (ref 4.22–5.81)
RDW: 12.6 % (ref 11.5–15.5)
WBC: 7.9 10*3/uL (ref 4.0–10.5)
nRBC: 0 % (ref 0.0–0.2)

## 2018-11-13 LAB — BASIC METABOLIC PANEL
Anion gap: 9 (ref 5–15)
BUN: 22 mg/dL (ref 8–23)
CO2: 27 mmol/L (ref 22–32)
Calcium: 8.6 mg/dL — ABNORMAL LOW (ref 8.9–10.3)
Chloride: 102 mmol/L (ref 98–111)
Creatinine, Ser: 1.07 mg/dL (ref 0.61–1.24)
GFR calc Af Amer: >60 mL/min (ref >60)
GFR calc non Af Amer: >60 mL/min (ref >60)
Glucose, Bld: 220 mg/dL — ABNORMAL HIGH (ref 70–99)
Potassium: 3.6 mmol/L (ref 3.5–5.1)
Sodium: 138 mmol/L (ref 135–145)

## 2018-11-13 NOTE — Progress Notes (Signed)
Randy Howard to be D/C'd Home per MD order.  Discussed prescriptions and follow up appointments with the patient. Prescriptions given to patient, medication list explained in detail. Pt verbalized understanding.  Allergies as of 11/13/2018      Reactions   Oxycodone Nausea Only      Medication List    TAKE these medications   aspirin EC 81 MG tablet Take 1 tablet (81 mg total) by mouth daily.   atorvastatin 10 MG tablet Commonly known as: Lipitor Take 1 tablet (10 mg total) by mouth daily.   baclofen 10 MG tablet Commonly known as: LIORESAL TAKE 1 TABLET BY MOUTH THREE TIMES DAILYAS NEEDED   benazepril 40 MG tablet Commonly known as: LOTENSIN TAKE 1 TABLET BY MOUTH ONCE DAILY   busPIRone 5 MG tablet Commonly known as: BUSPAR TAKE 1 TABLET BY MOUTH ONCE DAILY AS NEEDED.   clopidogrel 75 MG tablet Commonly known as: PLAVIX TAKE 1 TABLET BY MOUTH ONCE DAILY.   finasteride 5 MG tablet Commonly known as: PROSCAR TAKE 1 TABLET BY MOUTH ONCE DAILY.   gabapentin 100 MG capsule Commonly known as: NEURONTIN TAKE 2 CAPSULES BY MOUTH IN THE MORNING AND 1-2 CAPSULES AT BEDTIME.   glimepiride 4 MG tablet Commonly known as: AMARYL TAKE 1 TABLET BY MOUTH ONCE EVERY MORNING   hydrochlorothiazide 25 MG tablet Commonly known as: HYDRODIURIL Take 25 mg by mouth daily.   HYDROcodone-acetaminophen 5-325 MG tablet Commonly known as: NORCO/VICODIN Take 1 tablet by mouth every 4 (four) hours as needed for severe pain.   hydrOXYzine 10 MG tablet Commonly known as: ATARAX/VISTARIL TAKE 1 TABLET BY MOUTH 3 TIMES DAILY AS NEEDED   Magnesium Oxide (Antacid) 500 MG Caps Take by mouth.   meclizine 25 MG tablet Commonly known as: ANTIVERT Take 1 tablet (25 mg total) by mouth daily as needed for dizziness.   metoprolol tartrate 50 MG tablet Commonly known as: LOPRESSOR TAKE 1 TABLET BY MOUTH TWICE DAILY   mirtazapine 15 MG tablet Commonly known as: REMERON   nitroGLYCERIN 0.4 MG  SL tablet Commonly known as: NITROSTAT Place 1 tablet (0.4 mg total) under the tongue every 5 (five) minutes as needed for chest pain.   Potassium 99 MG Tabs Take by mouth.   tamsulosin 0.4 MG Caps capsule Commonly known as: FLOMAX TAKE 1 CAPSULE BY MOUTH ONCE DAILY 30 MINUTES AFTER LARGEST MEAL.   torsemide 20 MG tablet Commonly known as: DEMADEX TAKE 20 MG BY MOUTH TWICE DAILY FOR 3 DAYS, THEN 20 MG DAILY THEREAFTER   traMADol 50 MG tablet Commonly known as: ULTRAM Take 1 tablet (50 mg total) by mouth every 12 (twelve) hours as needed.   triamcinolone ointment 0.1 % Commonly known as: KENALOG Apply topically.       Vitals:   11/12/18 2001 11/13/18 0424  BP: (!) 146/68 138/65  Pulse: 75 63  Resp: 18 18  Temp: 98.1 F (36.7 C) 98.6 F (37 C)  SpO2: 100% 98%    Skin clean, dry and intact without evidence of skin break down, no evidence of skin tears noted. IV catheter discontinued intact. Site without signs and symptoms of complications. Dressing and pressure applied. Pt denies pain at this time. No complaints noted.  An After Visit Summary was printed and given to the patient. Patient escorted via [place], and D/C home via private auto.  Fuller Mandril, RN

## 2018-11-13 NOTE — Discharge Summary (Signed)
Physician Discharge Summary  Patient ID: Randy Howard MRN: [id number] DOB/AGE: Jul 18, 1940 78 y.o.  Admit date: 11/12/2018 Discharge date: 11/13/2018  Admission Diagnoses:PAD  Discharge Diagnoses:  Active Problems:   Atherosclerosis of artery of extremity with rest pain [facility])   Discharged Condition: fair  Hospital Course: 78 yo kept overnight after angio for right leg chronic ischemia.  No overnight issues  Consults: None  Significant Diagnostic Studies: angiography:    Discharge Exam: Blood pressure 138/65, pulse 63, temperature 98.6 F (37 C), temperature source Oral, resp. rate 18, height 5\' 11"  (1.803 m), weight 74.2 kg, SpO2 98 %. groin iste ok  Disposition: Discharge disposition: 01-Home or Self Care        Allergies as of 11/13/2018      Reactions   Oxycodone Nausea Only      Medication List    TAKE these medications   aspirin EC 81 MG tablet Take 1 tablet (81 mg total) by mouth daily.   atorvastatin 10 MG tablet Commonly known as: Lipitor Take 1 tablet (10 mg total) by mouth daily.   baclofen 10 MG tablet Commonly known as: LIORESAL TAKE 1 TABLET BY MOUTH THREE TIMES DAILYAS NEEDED   benazepril 40 MG tablet Commonly known as: LOTENSIN TAKE 1 TABLET BY MOUTH ONCE DAILY   busPIRone 5 MG tablet Commonly known as: BUSPAR TAKE 1 TABLET BY MOUTH ONCE DAILY AS NEEDED.   clopidogrel 75 MG tablet Commonly known as: PLAVIX TAKE 1 TABLET BY MOUTH ONCE DAILY.   finasteride 5 MG tablet Commonly known as: PROSCAR TAKE 1 TABLET BY MOUTH ONCE DAILY.   gabapentin 100 MG capsule Commonly known as: NEURONTIN TAKE 2 CAPSULES BY MOUTH IN THE MORNING AND 1-2 CAPSULES AT BEDTIME.   glimepiride 4 MG tablet Commonly known as: AMARYL TAKE 1 TABLET BY MOUTH ONCE EVERY MORNING   hydrochlorothiazide 25 MG tablet Commonly known as: HYDRODIURIL Take 25 mg by mouth daily.   HYDROcodone-acetaminophen 5-325 MG tablet Commonly known as: NORCO/VICODIN Take 1  tablet by mouth every 4 (four) hours as needed for severe pain.   hydrOXYzine 10 MG tablet Commonly known as: ATARAX/VISTARIL TAKE 1 TABLET BY MOUTH 3 TIMES DAILY AS NEEDED   Magnesium Oxide (Antacid) 500 MG Caps Take by mouth.   meclizine 25 MG tablet Commonly known as: ANTIVERT Take 1 tablet (25 mg total) by mouth daily as needed for dizziness.   metoprolol tartrate 50 MG tablet Commonly known as: LOPRESSOR TAKE 1 TABLET BY MOUTH TWICE DAILY   mirtazapine 15 MG tablet Commonly known as: REMERON   nitroGLYCERIN 0.4 MG SL tablet Commonly known as: NITROSTAT Place 1 tablet (0.4 mg total) under the tongue every 5 (five) minutes as needed for chest pain.   Potassium 99 MG Tabs Take by mouth.   tamsulosin 0.4 MG Caps capsule Commonly known as: FLOMAX TAKE 1 CAPSULE BY MOUTH ONCE DAILY 30 MINUTES AFTER LARGEST MEAL.   torsemide 20 MG tablet Commonly known as: DEMADEX TAKE 20 MG BY MOUTH TWICE DAILY FOR 3 DAYS, THEN 20 MG DAILY THEREAFTER   traMADol 50 MG tablet Commonly known as: ULTRAM Take 1 tablet (50 mg total) by mouth every 12 (twelve) hours as needed.   triamcinolone ointment 0.1 % Commonly known as: KENALOG Apply topically.        Signed: Wells Kentrail Shew 11/13/2018, 3:05 PM

## 2018-11-13 NOTE — Progress Notes (Signed)
    Subjective  - POD #1  No complaints this am Says right leg feels better   Physical Exam:  Left femoral access site soft  Dressing dry       Assessment/Plan:  POD #1  OK for d/c home  Wells Yasamin Karel 11/13/2018 3:02 PM --  Vitals:   11/12/18 2001 11/13/18 0424  BP: (!) 146/68 138/65  Pulse: 75 63  Resp: 18 18  Temp: 98.1 F (36.7 C) 98.6 F (37 C)  SpO2: 100% 98%    Intake/Output Summary (Last 24 hours) at 11/13/2018 1502 Last data filed at 11/13/2018 1324 Gross per 24 hour  Intake 1885.49 ml  Output 500 ml  Net 1385.49 ml     Laboratory CBC    Component Value Date/Time   WBC 7.9 11/13/2018 0512   HGB 10.2 (L) 11/13/2018 0512   HCT 31.2 (L) 11/13/2018 0512   PLT 143 (L) 11/13/2018 0512    BMET    Component Value Date/Time   NA 138 11/13/2018 0512   NA 142 07/30/2013   NA 141 01/05/2012 0724   K 3.6 11/13/2018 0512   K 3.8 01/05/2012 0724   CL 102 11/13/2018 0512   CL 106 01/05/2012 0724   CO2 27 11/13/2018 0512   CO2 28 01/05/2012 0724   GLUCOSE 220 (H) 11/13/2018 0512   GLUCOSE 152 (H) 01/05/2012 0724   BUN 22 11/13/2018 0512   BUN 15 07/30/2013   BUN 24 (H) 01/05/2012 0724   CREATININE 1.07 11/13/2018 0512   CREATININE 1.27 (H) 10/25/2018 0917   CALCIUM 8.6 (L) 11/13/2018 0512   CALCIUM 9.0 01/05/2012 0724   GFRNONAA >60 11/13/2018 0512   GFRNONAA 53 (L) 10/25/2018 0917   GFRAA >60 11/13/2018 0512   GFRAA 62 10/25/2018 0917    COAG Lab Results  Component Value Date   INR 1.21 10/24/2014   No results found for: PTT  Antibiotics Anti-infectives (From admission, onward)   Start     Dose/Rate Route Frequency Ordered Stop   11/12/18 1215  ceFAZolin (ANCEF) IVPB 2g/100 mL premix  Status:  Discontinued     2 g 200 mL/hr over 30 Minutes Intravenous  Once 11/12/18 1201 11/12/18 1521   11/12/18 1204  ceFAZolin (ANCEF) 2-4 GM/100ML-% IVPB    Note to Pharmacy: Despina Arias  : cabinet override      11/12/18 1204 11/12/18 1652        V. Leia Alf, M.D., [facility] [place] Office: [phone] Pager:  [phone]

## 2018-11-13 NOTE — Progress Notes (Signed)
Offered to change patients dressing on left foot. Patient refused.   Fuller Mandril, RN

## 2018-11-13 NOTE — Care Management Obs Status (Signed)
[place] NOTIFICATION   Patient Details  Name: Randy Howard MRN: [id number] Date of Birth: 1941/01/05   Medicare Observation Status Notification Given:  Yes    Demani Weyrauch A Joanne Salah, RN 11/13/2018, 9:11 AM

## 2018-11-15 ENCOUNTER — Encounter: Payer: Self-pay | Admitting: Vascular Surgery

## 2018-11-22 ENCOUNTER — Other Ambulatory Visit: Payer: Medicare Other

## 2018-12-01 DIAGNOSIS — L97521 Non-pressure chronic ulcer of other part of left foot limited to breakdown of skin: Secondary | ICD-10-CM | POA: Diagnosis not present

## 2018-12-17 ENCOUNTER — Other Ambulatory Visit: Payer: Self-pay

## 2018-12-17 NOTE — Patient Outreach (Signed)
[place] [facility]) Care Management  12/17/2018  Randy Howard 11-07-1940 [id number]   Medication Adherence call to Randy Howard [facility] Identifiers Verify spoke with patient he is past due on Glimepiride 4 mg patient explains he take one tablet daily and has enough for 3 more days patient prefers to order it him self and will order in three days.Randy Howard is showing past due under [place].   [place] Management Direct Dial [phone]  Fax [phone] [name]

## 2018-12-20 ENCOUNTER — Other Ambulatory Visit: Payer: Self-pay | Admitting: Family Medicine

## 2018-12-20 DIAGNOSIS — R112 Nausea with vomiting, unspecified: Secondary | ICD-10-CM

## 2018-12-20 DIAGNOSIS — R42 Dizziness and giddiness: Secondary | ICD-10-CM

## 2019-01-07 DIAGNOSIS — B351 Tinea unguium: Secondary | ICD-10-CM | POA: Diagnosis not present

## 2019-01-07 DIAGNOSIS — I739 Peripheral vascular disease, unspecified: Secondary | ICD-10-CM | POA: Diagnosis not present

## 2019-01-07 DIAGNOSIS — L97521 Non-pressure chronic ulcer of other part of left foot limited to breakdown of skin: Secondary | ICD-10-CM | POA: Diagnosis not present

## 2019-01-14 DIAGNOSIS — R600 Localized edema: Secondary | ICD-10-CM | POA: Diagnosis not present

## 2019-01-14 DIAGNOSIS — E11621 Type 2 diabetes mellitus with foot ulcer: Secondary | ICD-10-CM | POA: Diagnosis not present

## 2019-01-14 DIAGNOSIS — R0602 Shortness of breath: Secondary | ICD-10-CM | POA: Diagnosis not present

## 2019-01-14 DIAGNOSIS — I2510 Atherosclerotic heart disease of native coronary artery without angina pectoris: Secondary | ICD-10-CM | POA: Diagnosis not present

## 2019-01-14 DIAGNOSIS — G4733 Obstructive sleep apnea (adult) (pediatric): Secondary | ICD-10-CM | POA: Diagnosis not present

## 2019-01-15 ENCOUNTER — Other Ambulatory Visit: Payer: Self-pay | Admitting: Family Medicine

## 2019-01-15 DIAGNOSIS — E1142 Type 2 diabetes mellitus with diabetic polyneuropathy: Secondary | ICD-10-CM

## 2019-02-15 ENCOUNTER — Other Ambulatory Visit: Payer: Self-pay

## 2019-02-15 NOTE — Patient Outreach (Signed)
[place] [facility]) Care Management  02/15/2019  THADDUS GIKAS Jan 25, 1941 [id number]   Medication Adherence call to Mr. Randy Howard Hippa Identifiers Verify spoke with patient he is past due on Atorvastatin 10 mg,patient explain he takes 1 tablet daily,patient explain he has 15 more days and will order when finished. Mr. Saber is showing past due under [place].   [place] Management Direct Dial [phone]  Fax [phone] [email or web address]

## 2019-03-04 ENCOUNTER — Other Ambulatory Visit: Payer: Self-pay | Admitting: Family Medicine

## 2019-03-04 DIAGNOSIS — R351 Nocturia: Secondary | ICD-10-CM

## 2019-03-04 DIAGNOSIS — N401 Enlarged prostate with lower urinary tract symptoms: Secondary | ICD-10-CM

## 2019-03-04 DIAGNOSIS — I10 Essential (primary) hypertension: Secondary | ICD-10-CM

## 2019-03-24 DIAGNOSIS — L97521 Non-pressure chronic ulcer of other part of left foot limited to breakdown of skin: Secondary | ICD-10-CM | POA: Diagnosis not present

## 2019-04-07 ENCOUNTER — Telehealth: Payer: Self-pay | Admitting: Family Medicine

## 2019-04-07 NOTE — Chronic Care Management (AMB) (Signed)
  Chronic Care Management   Note  04/07/2019 Name: Randy Howard MRN: [id number] DOB: 22-Jul-1940  Randy Howard is a 79 y.o. year old male who is a primary care patient of Olin Hauser, DO. I reached out to Wilmon Pali by phone today in response to a referral sent by Randy Howard [facility] health plan.     Randy Howard was given information about Chronic Care Management services today including:  1. CCM service includes personalized support from designated clinical staff supervised by his physician, including individualized plan of care and coordination with other care providers 2. 24/7 contact phone numbers for assistance for urgent and routine care needs. 3. Service will only be billed when office clinical staff spend 20 minutes or more in a month to coordinate care. 4. Only one practitioner may furnish and bill the service in a calendar month. 5. The patient may stop CCM services at any time (effective at the end of the month) by phone call to the office staff. 6. The patient will be responsible for cost sharing (co-pay) of up to 20% of the service fee (after annual deductible is met).  Patient did not agree to services and wishes to discuss with provider prior to deciding about enrollment in care management services.   Follow up plan: The care management team is available to follow up with the patient after provider conversation with the patient regarding recommendation for care management engagement and subsequent re-referral to the care management team.   [place], [place], [place] [address] Direct Dial: [email or web address]  Website: [email or web address]

## 2019-05-06 ENCOUNTER — Ambulatory Visit: Payer: Medicare Other | Admitting: Family Medicine

## 2019-05-10 ENCOUNTER — Other Ambulatory Visit: Payer: Self-pay | Admitting: Family Medicine

## 2019-05-10 DIAGNOSIS — G4701 Insomnia due to medical condition: Secondary | ICD-10-CM

## 2019-05-10 DIAGNOSIS — N401 Enlarged prostate with lower urinary tract symptoms: Secondary | ICD-10-CM

## 2019-05-11 NOTE — Telephone Encounter (Signed)
Not our patient

## 2019-05-20 ENCOUNTER — Ambulatory Visit: Payer: Medicare Other | Admitting: Family Medicine

## 2019-05-22 ENCOUNTER — Ambulatory Visit: Payer: Medicare Other | Attending: Internal Medicine

## 2019-05-22 DIAGNOSIS — Z23 Encounter for immunization: Secondary | ICD-10-CM

## 2019-05-22 NOTE — Progress Notes (Signed)
   Covid-19 Vaccination Clinic  Name:  CHANANYA GOSSAGE    MRN: [id number] DOB: 03-Mar-1941  05/22/2019  Mr. Gerow was observed post Covid-19 immunization for 15 minutes without incidence. He was provided with Vaccine Information Sheet and instruction to access the V-Safe system.   Mr. Widrick was instructed to call 911 with any severe reactions post vaccine: Marland Kitchen Difficulty breathing  . Swelling of your face and throat  . A fast heartbeat  . A bad rash all over your body  . Dizziness and weakness    Immunizations Administered    Name Date Dose VIS Date Route   Pfizer COVID-19 Vaccine 05/22/2019  9:05 AM 0.3 mL 03/04/2019 Intramuscular   Manufacturer: [place]   Lot: [id number]   [place]: [id number]

## 2019-06-08 ENCOUNTER — Encounter: Payer: Self-pay | Admitting: Family Medicine

## 2019-06-08 ENCOUNTER — Other Ambulatory Visit: Payer: Self-pay | Admitting: Family Medicine

## 2019-06-08 ENCOUNTER — Ambulatory Visit (INDEPENDENT_AMBULATORY_CARE_PROVIDER_SITE_OTHER): Payer: Medicare Other | Admitting: Family Medicine

## 2019-06-08 ENCOUNTER — Other Ambulatory Visit: Payer: Self-pay

## 2019-06-08 VITALS — BP 120/57 | HR 63 | Temp 97.8°F | Resp 16 | Ht 71.0 in | Wt 170.0 lb

## 2019-06-08 DIAGNOSIS — F419 Anxiety disorder, unspecified: Secondary | ICD-10-CM

## 2019-06-08 DIAGNOSIS — Z0000 Encounter for general adult medical examination without abnormal findings: Secondary | ICD-10-CM

## 2019-06-08 DIAGNOSIS — N1831 Chronic kidney disease, stage 3a: Secondary | ICD-10-CM

## 2019-06-08 DIAGNOSIS — I739 Peripheral vascular disease, unspecified: Secondary | ICD-10-CM

## 2019-06-08 DIAGNOSIS — E785 Hyperlipidemia, unspecified: Secondary | ICD-10-CM

## 2019-06-08 DIAGNOSIS — I10 Essential (primary) hypertension: Secondary | ICD-10-CM | POA: Diagnosis not present

## 2019-06-08 DIAGNOSIS — N401 Enlarged prostate with lower urinary tract symptoms: Secondary | ICD-10-CM

## 2019-06-08 DIAGNOSIS — I872 Venous insufficiency (chronic) (peripheral): Secondary | ICD-10-CM | POA: Diagnosis not present

## 2019-06-08 DIAGNOSIS — I129 Hypertensive chronic kidney disease with stage 1 through stage 4 chronic kidney disease, or unspecified chronic kidney disease: Secondary | ICD-10-CM

## 2019-06-08 DIAGNOSIS — E1142 Type 2 diabetes mellitus with diabetic polyneuropathy: Secondary | ICD-10-CM

## 2019-06-08 DIAGNOSIS — I509 Heart failure, unspecified: Secondary | ICD-10-CM

## 2019-06-08 DIAGNOSIS — N183 Chronic kidney disease, stage 3 unspecified: Secondary | ICD-10-CM

## 2019-06-08 DIAGNOSIS — J3089 Other allergic rhinitis: Secondary | ICD-10-CM

## 2019-06-08 DIAGNOSIS — E1169 Type 2 diabetes mellitus with other specified complication: Secondary | ICD-10-CM

## 2019-06-08 LAB — POCT GLYCOSYLATED HEMOGLOBIN (HGB A1C): Hemoglobin A1C: 7.9 % — AB (ref 4.0–5.6)

## 2019-06-08 MED ORDER — FLUTICASONE PROPIONATE 50 MCG/ACT NA SUSP: 2.0000 | Freq: Every day | NASAL | 3 refills | Status: AC

## 2019-06-08 NOTE — Assessment & Plan Note (Signed)
Stable to improved CKD-III from prior readings Secondary to DM, HTN, age Avoid NSAIDs regularly Improve hydration  Increase Torsemide due to edema LE

## 2019-06-08 NOTE — Assessment & Plan Note (Signed)
Secondary edema from venous insufficiency PAD Followed by Vascular  Increase Torsemide from 20mg  daily now back to 20mg  BID for up to 1 week, can reduce to daily if need or adjust on PRN basis  Improve LE elevation prop up while in bed, work on improving compression if tolerated  Return to Vascular/Cardiology

## 2019-06-08 NOTE — Assessment & Plan Note (Signed)
Significant PAD extremities, previous interventions Dr Lucky Cowboy angiography Followed by Vascular Surgery / Cardiology Current foot ulceration healing per Podiatry Continue on medical management

## 2019-06-08 NOTE — Patient Instructions (Addendum)
Thank you for coming to the office today.  Increase Torsemide from 20mg  daily, now to take one pill TWICE a day for 1 week. If helping, and swelling does reduce, then please call and we can order more. If swelling is improved then you can reduce back to once daily Torsemide  Try elevating legs in evening when laying in bed, use pillow to prop up.  Recent Labs    09/23/18 1656 10/25/18 0917 06/08/19 1429  HGBA1C 8.1* 7.4* 7.9*    Your provider would like to you have your annual eye exam. Please contact your current eye doctor or here are some good options for you to contact.    [facility] [address] [place], [place] [address] Phone: [phone] [email or web address]  Please schedule a Follow-up Appointment to: Return in about 6 months (around 12/09/2019) for Annual Physical.  If you have any other questions or concerns, please feel free to call the office or send a message through [place]. You may also schedule an earlier appointment if necessary.  Additionally, you may be receiving a survey about your experience at our office within a few days to 1 week by e-mail or mail. We value your feedback.  [name], DO [place]

## 2019-06-08 NOTE — Progress Notes (Signed)
Subjective:    Patient ID: Randy Howard, male    DOB: Jul 23, 1940, 79 y.o.   MRN: [id number]  Randy Howard is a 79 y.o. male presenting on 06/08/2019 for Leg Swelling (6 month follow up)   HPI   CHRONIC DM, Type 2complicated by peripheral neuropathy Prior A1c 7 range. Due today for A1c Not checking CBG regularly. Doesn't have glucometer that works. He is unsure how to use last one he says can request new one to order. Meds:Glimepiride 4mg  daily in AM Reports good compliance. Tolerating well w/o side-effects Currently on ACEi Rare hypoglycemia symptoms - none recently Due for DM Eye exam - he needs to schedule w/ [facility] Admits numbness feet, neuropathy, ulceration - followed by Dr Sharlotte Alamo at [facility] Denies polyuria, visual changes  CHRONIC HTN / CHF/ PAD/ Venous Insufficiency Edema / CKD-III Last vascular hospitalization 10/2018 Had procedure, may need to return. Worse edema bilateral, usually has chronic R sided swelling, and L side is not often as swollen but now both are very swollen. He is not able to elevate them as often. Followed by Cardiology, Vascular Surgery Normal BP readings Previously on furosemide, now on Torsemide doing better but recently issue with edema worse Current Meds - Benazepril 40mg  daily, Metoprolol 50mg  BID, HCTZ 25, Torsemide 20mg  daily Reports good compliance, took meds today. Tolerating well, w/o complaints. 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   Depression screen [facility] 2/9 10/29/2018 01/29/2018 11/12/2016  Decreased Interest 0 1 1  Down, Depressed, Hopeless 0 0 2  PHQ - 2 Score 0 1 3  Altered sleeping 3 1 3   Tired, decreased energy 3 1 3   Change in appetite 3 0 3  Feeling bad or failure about yourself  0 0 0  Trouble concentrating 0 1 2  Moving slowly or fidgety/restless 0 0 1  Suicidal thoughts 1 0 0  PHQ-9 Score 10 4 15   Difficult doing work/chores Not difficult at all Not difficult at all Somewhat difficult   No flowsheet data found.    Social History   Tobacco Use  . Smoking status: Former Smoker    Packs/day: 1.00    Years: 25.00    Pack years: 25.00    Types: Cigarettes    Quit date: 03/24/1985    Years since quitting: 34.2  . Smokeless tobacco: Former Network engineer Use Topics  . Alcohol use: No    Alcohol/week: 0.0 standard drinks  . Drug use: No    Review of Systems Per HPI unless specifically indicated above     Objective:    BP (!) 120/57   Pulse 63   Temp 97.8 F (36.6 C) (Temporal)   Resp 16   Ht 5\' 11"  (1.803 m)   Wt 170 lb (77.1 kg)   BMI 23.71 kg/m   Wt Readings  from Last 3 Encounters:  06/08/19 170 lb (77.1 kg)  11/13/18 163 lb 9.3 oz (74.2 kg)  11/04/18 158 lb (71.7 kg)    Physical Exam Vitals and nursing note reviewed.  Constitutional:      General: He is not in acute distress.    Appearance: He is well-developed. He is not diaphoretic.     Comments: Well-appearing, comfortable, cooperative  HENT:     Head: Normocephalic and atraumatic.  Eyes:     General:        Right eye: No discharge.        Left eye: No discharge.     Conjunctiva/sclera: Conjunctivae normal.     Pupils: Pupils are equal, round, and reactive to light.  Neck:     Thyroid: No thyromegaly.  Cardiovascular:     Rate and Rhythm:  Normal rate and regular rhythm.     Heart sounds: Normal heart sounds. No murmur.  Pulmonary:     Effort: Pulmonary effort is normal. No respiratory distress.     Breath sounds: Normal breath sounds. No wheezing or rales.  Abdominal:     General: Bowel sounds are normal. There is no distension.     Palpations: Abdomen is soft. There is no mass.     Tenderness: There is no abdominal tenderness.  Musculoskeletal:        General: No tenderness. Normal range of motion.     Cervical back: Normal range of motion and neck supple.     Right lower leg: Edema (+2-3) present.     Left lower leg: Edema (+2) present.     Comments: Upper / Lower Extremities: - Normal muscle tone, strength bilateral upper extremities 5/5, lower extremities 5/5  Lymphadenopathy:     Cervical: No cervical adenopathy.  Skin:    General: Skin is warm and dry.     Findings: No erythema or rash.     Comments: Bilateral lower extremities feet ankles with some evidence of chronic venous changes and PAD with some color change stasis dermatitis  Neurological:     Mental Status: He is alert and oriented to person, place, and time.     Coordination: Coordination normal.     Comments: Distal sensation intact to light touch all extremities  Psychiatric:        Behavior: Behavior normal.     Comments: Well groomed, good eye contact, normal speech and thoughts        Diabetic Foot Exam - Simple   Simple Foot Form Diabetic Foot exam was performed with the following findings: Yes 06/08/2019  2:47 PM  Visual Inspection See comments: Yes Sensation Testing See comments: Yes Pulse Check See comments: Yes Comments Chronic ulceration L forefoot. Multiple areas of callus formation. Reduced sensation to monofilament testing remains greatly reduced. Reduced sensation to light touch. Dry flaking skin.    Recent Labs    09/23/18 1656 10/25/18 0917 06/08/19 1429  HGBA1C 8.1* 7.4* 7.9*     Results for orders placed or  performed in visit on 06/08/19  POCT glycosylated hemoglobin (Hb A1C)  Result Value Ref Range   Hemoglobin A1C 7.9 (A) 4.0 - 5.6 %      Assessment & Plan:   Problem List Items Addressed This Visit    Type 2 diabetes mellitus with diabetic polyneuropathy, without long-term current use of insulin (HCC)    Mild elevated A1c to 7.9, near goal < 8 With some history of hyperglycemia Rare episodes of hypoglycemia, complication Complications - CKD-III,  peripheral neuropathy, DM retinopathy, other including hyperlipidemia - increases risk of future cardiovascular complications  Plan:  1. Continue current therapy - Glimepiride 4mg  daily - caution hypoglycemia 2. Encourage improved lifestyle - low carb, low sugar diet, reduce portion size, continue improving regular exercise as tolerated 3. Check CBG, bring log to next visit for review 4. Continue ACEi, Statin 5. Advised to schedule DM ophtho exam, send record 6. Follow-up 6 months      Relevant Orders   POCT glycosylated hemoglobin (Hb A1C) (Completed)   Peripheral vascular disease (HCC) - Primary    Significant PAD extremities, previous interventions Dr Lucky Cowboy angiography Followed by Vascular Surgery / Cardiology Current foot ulceration healing per Podiatry Continue on medical management      Hypertension    Controlled currently Continue current regimen Except increase Torsemide for edema Monitor BP F/u w Cardiology      CKD (chronic kidney disease), stage III    Stable to improved CKD-III from prior readings Secondary to DM, HTN, age Avoid NSAIDs regularly Improve hydration  Increase Torsemide due to edema LE      Chronic venous insufficiency    Secondary edema from venous insufficiency PAD Followed by Vascular  Increase Torsemide from 20mg  daily now back to 20mg  BID for up to 1 week, can reduce to daily if need or adjust on PRN basis  Improve LE elevation prop up while in bed, work on improving compression if  tolerated  Return to Vascular/Cardiology      CHF (congestive heart failure) ([place])    Concern w/ bilateral LE edema, seems to be more peripheral vascular etiology, with volume overload Lungs clear, no other sign of acute CHF  See Edema A&P Increase Torsemide Improve elevation F/u with Cardiology Continue other meds       Other Visit Diagnoses    Seasonal allergic rhinitis due to other allergic trigger       Relevant Medications   fluticasone (FLONASE) 50 MCG/ACT nasal spray      For congestion phlegm add Flonase  F/u w Dr Lucky Cowboy  Meds ordered this encounter  Medications  . fluticasone (FLONASE) 50 MCG/ACT nasal spray    Sig: Place 2 sprays into both nostrils daily. Use for 4-6 weeks then stop and use seasonally or as needed.    Dispense:  16 g    Refill:  3      Follow up plan: Return in about 6 months (around 12/09/2019) for Annual Physical.  Future labs ordered for 12/07/19  [name], [facility] [facility] 06/08/2019, 2:00 PM

## 2019-06-08 NOTE — Assessment & Plan Note (Signed)
Controlled currently Continue current regimen Except increase Torsemide for edema Monitor BP F/u w Cardiology

## 2019-06-08 NOTE — Assessment & Plan Note (Signed)
Mild elevated A1c to 7.9, near goal < 8 With some history of hyperglycemia Rare episodes of hypoglycemia, complication Complications - CKD-III, peripheral neuropathy, DM retinopathy, other including hyperlipidemia - increases risk of future cardiovascular complications  Plan:  1. Continue current therapy - Glimepiride 4mg  daily - caution hypoglycemia 2. Encourage improved lifestyle - low carb, low sugar diet, reduce portion size, continue improving regular exercise as tolerated 3. Check CBG, bring log to next visit for review 4. Continue ACEi, Statin 5. Advised to schedule DM ophtho exam, send record 6. Follow-up 6 months

## 2019-06-08 NOTE — Assessment & Plan Note (Signed)
Concern w/ bilateral LE edema, seems to be more peripheral vascular etiology, with volume overload Lungs clear, no other sign of acute CHF  See Edema A&P Increase Torsemide Improve elevation F/u with Cardiology Continue other meds

## 2019-06-10 DIAGNOSIS — B351 Tinea unguium: Secondary | ICD-10-CM | POA: Diagnosis not present

## 2019-06-10 DIAGNOSIS — E1140 Type 2 diabetes mellitus with diabetic neuropathy, unspecified: Secondary | ICD-10-CM | POA: Diagnosis not present

## 2019-06-10 DIAGNOSIS — L97521 Non-pressure chronic ulcer of other part of left foot limited to breakdown of skin: Secondary | ICD-10-CM | POA: Diagnosis not present

## 2019-06-10 DIAGNOSIS — L851 Acquired keratosis [keratoderma] palmaris et plantaris: Secondary | ICD-10-CM | POA: Diagnosis not present

## 2019-06-13 ENCOUNTER — Ambulatory Visit: Payer: Medicare Other | Attending: Internal Medicine

## 2019-06-13 DIAGNOSIS — Z23 Encounter for immunization: Secondary | ICD-10-CM

## 2019-06-13 NOTE — Progress Notes (Signed)
   Covid-19 Vaccination Clinic  Name:  Randy Howard    MRN: [id number] DOB: 15-Nov-1940  06/13/2019  [name] was observed post Covid-19 immunization for 15 minutes without incident. He was provided with Vaccine Information Sheet and instruction to access the V-Safe system.   [name] was instructed to call 911 with any severe reactions post vaccine: Marland Kitchen Difficulty breathing  . Swelling of face and throat  . A fast heartbeat  . A bad rash all over body  . Dizziness and weakness   Immunizations Administered    Name Date Dose VIS Date Route   Pfizer COVID-19 Vaccine 06/13/2019 10:32 AM 0.3 mL 03/04/2019 Intramuscular   Manufacturer: [place]   Lot: [id number]   [place]: [id number]

## 2019-07-08 ENCOUNTER — Telehealth (INDEPENDENT_AMBULATORY_CARE_PROVIDER_SITE_OTHER): Payer: Self-pay | Admitting: Vascular Surgery

## 2019-07-08 NOTE — Telephone Encounter (Signed)
Called pt back and left vm to call us back to schedule appt.

## 2019-07-08 NOTE — Telephone Encounter (Signed)
Bilateral ABIs.Marland KitchenMarland KitchenDew or Me per pt preference

## 2019-07-14 ENCOUNTER — Telehealth (INDEPENDENT_AMBULATORY_CARE_PROVIDER_SITE_OTHER): Payer: Self-pay | Admitting: Vascular Surgery

## 2019-07-14 NOTE — Telephone Encounter (Signed)
That is fine 

## 2019-07-14 NOTE — Telephone Encounter (Signed)
This pt refused the appointment we offered him and might go to the ED.

## 2019-07-20 ENCOUNTER — Other Ambulatory Visit (INDEPENDENT_AMBULATORY_CARE_PROVIDER_SITE_OTHER): Payer: Self-pay | Admitting: Nurse Practitioner

## 2019-07-20 ENCOUNTER — Telehealth (INDEPENDENT_AMBULATORY_CARE_PROVIDER_SITE_OTHER): Payer: Self-pay

## 2019-07-20 DIAGNOSIS — L97519 Non-pressure chronic ulcer of other part of right foot with unspecified severity: Secondary | ICD-10-CM

## 2019-07-20 DIAGNOSIS — M7989 Other specified soft tissue disorders: Secondary | ICD-10-CM

## 2019-07-20 DIAGNOSIS — L97529 Non-pressure chronic ulcer of other part of left foot with unspecified severity: Secondary | ICD-10-CM

## 2019-07-20 DIAGNOSIS — M79604 Pain in right leg: Secondary | ICD-10-CM

## 2019-07-21 ENCOUNTER — Emergency Department: Payer: Medicare Other

## 2019-07-21 ENCOUNTER — Encounter: Payer: Self-pay | Admitting: Emergency Medicine

## 2019-07-21 ENCOUNTER — Other Ambulatory Visit: Payer: Self-pay

## 2019-07-21 ENCOUNTER — Inpatient Hospital Stay
Admission: EM | Admit: 2019-07-21 | Discharge: 2019-07-23 | DRG: 313 | Disposition: A | Discharge status: Home / Self Care | Payer: Medicare Other | Attending: Hospitalist | Admitting: Hospitalist

## 2019-07-21 DIAGNOSIS — Y9389 Activity, other specified: Secondary | ICD-10-CM

## 2019-07-21 DIAGNOSIS — N183 Chronic kidney disease, stage 3 unspecified: Secondary | ICD-10-CM | POA: Diagnosis present

## 2019-07-21 DIAGNOSIS — N179 Acute kidney failure, unspecified: Secondary | ICD-10-CM | POA: Diagnosis not present

## 2019-07-21 DIAGNOSIS — J439 Emphysema, unspecified: Secondary | ICD-10-CM | POA: Diagnosis present

## 2019-07-21 DIAGNOSIS — Z7982 Long term (current) use of aspirin: Secondary | ICD-10-CM | POA: Diagnosis not present

## 2019-07-21 DIAGNOSIS — I5020 Unspecified systolic (congestive) heart failure: Secondary | ICD-10-CM

## 2019-07-21 DIAGNOSIS — M1990 Unspecified osteoarthritis, unspecified site: Secondary | ICD-10-CM | POA: Diagnosis present

## 2019-07-21 DIAGNOSIS — S199XXA Unspecified injury of neck, initial encounter: Secondary | ICD-10-CM | POA: Diagnosis not present

## 2019-07-21 DIAGNOSIS — I10 Essential (primary) hypertension: Secondary | ICD-10-CM

## 2019-07-21 DIAGNOSIS — Z20822 Contact with and (suspected) exposure to covid-19: Secondary | ICD-10-CM | POA: Diagnosis present

## 2019-07-21 DIAGNOSIS — M159 Polyosteoarthritis, unspecified: Secondary | ICD-10-CM

## 2019-07-21 DIAGNOSIS — W19XXXA Unspecified fall, initial encounter: Secondary | ICD-10-CM | POA: Diagnosis not present

## 2019-07-21 DIAGNOSIS — I5022 Chronic systolic (congestive) heart failure: Secondary | ICD-10-CM | POA: Diagnosis not present

## 2019-07-21 DIAGNOSIS — E785 Hyperlipidemia, unspecified: Secondary | ICD-10-CM | POA: Diagnosis present

## 2019-07-21 DIAGNOSIS — W19XXXD Unspecified fall, subsequent encounter: Secondary | ICD-10-CM | POA: Diagnosis not present

## 2019-07-21 DIAGNOSIS — R079 Chest pain, unspecified: Principal | ICD-10-CM | POA: Diagnosis present

## 2019-07-21 DIAGNOSIS — E1122 Type 2 diabetes mellitus with diabetic chronic kidney disease: Secondary | ICD-10-CM | POA: Diagnosis present

## 2019-07-21 DIAGNOSIS — Z85820 Personal history of malignant melanoma of skin: Secondary | ICD-10-CM | POA: Diagnosis not present

## 2019-07-21 DIAGNOSIS — Z79899 Other long term (current) drug therapy: Secondary | ICD-10-CM | POA: Diagnosis not present

## 2019-07-21 DIAGNOSIS — M7989 Other specified soft tissue disorders: Secondary | ICD-10-CM | POA: Diagnosis present

## 2019-07-21 DIAGNOSIS — Z7902 Long term (current) use of antithrombotics/antiplatelets: Secondary | ICD-10-CM

## 2019-07-21 DIAGNOSIS — Z951 Presence of aortocoronary bypass graft: Secondary | ICD-10-CM

## 2019-07-21 DIAGNOSIS — Y92009 Unspecified place in unspecified non-institutional (private) residence as the place of occurrence of the external cause: Secondary | ICD-10-CM | POA: Diagnosis not present

## 2019-07-21 DIAGNOSIS — Z833 Family history of diabetes mellitus: Secondary | ICD-10-CM

## 2019-07-21 DIAGNOSIS — I272 Pulmonary hypertension, unspecified: Secondary | ICD-10-CM | POA: Diagnosis not present

## 2019-07-21 DIAGNOSIS — I130 Hypertensive heart and chronic kidney disease with heart failure and stage 1 through stage 4 chronic kidney disease, or unspecified chronic kidney disease: Secondary | ICD-10-CM | POA: Diagnosis present

## 2019-07-21 DIAGNOSIS — S299XXA Unspecified injury of thorax, initial encounter: Secondary | ICD-10-CM | POA: Diagnosis not present

## 2019-07-21 DIAGNOSIS — K08539 Fractured dental restorative material, unspecified: Secondary | ICD-10-CM | POA: Diagnosis present

## 2019-07-21 DIAGNOSIS — G4733 Obstructive sleep apnea (adult) (pediatric): Secondary | ICD-10-CM | POA: Diagnosis present

## 2019-07-21 DIAGNOSIS — I872 Venous insufficiency (chronic) (peripheral): Secondary | ICD-10-CM | POA: Diagnosis not present

## 2019-07-21 DIAGNOSIS — Z8249 Family history of ischemic heart disease and other diseases of the circulatory system: Secondary | ICD-10-CM

## 2019-07-21 DIAGNOSIS — S0990XA Unspecified injury of head, initial encounter: Secondary | ICD-10-CM

## 2019-07-21 DIAGNOSIS — F419 Anxiety disorder, unspecified: Secondary | ICD-10-CM

## 2019-07-21 DIAGNOSIS — E1151 Type 2 diabetes mellitus with diabetic peripheral angiopathy without gangrene: Secondary | ICD-10-CM | POA: Diagnosis not present

## 2019-07-21 DIAGNOSIS — S0083XA Contusion of other part of head, initial encounter: Secondary | ICD-10-CM | POA: Diagnosis not present

## 2019-07-21 DIAGNOSIS — I509 Heart failure, unspecified: Secondary | ICD-10-CM

## 2019-07-21 DIAGNOSIS — I110 Hypertensive heart disease with heart failure: Secondary | ICD-10-CM | POA: Diagnosis not present

## 2019-07-21 DIAGNOSIS — I517 Cardiomegaly: Secondary | ICD-10-CM | POA: Diagnosis not present

## 2019-07-21 DIAGNOSIS — W010XXA Fall on same level from slipping, tripping and stumbling without subsequent striking against object, initial encounter: Secondary | ICD-10-CM | POA: Diagnosis present

## 2019-07-21 DIAGNOSIS — G4701 Insomnia due to medical condition: Secondary | ICD-10-CM

## 2019-07-21 DIAGNOSIS — I5021 Acute systolic (congestive) heart failure: Secondary | ICD-10-CM | POA: Diagnosis not present

## 2019-07-21 DIAGNOSIS — E1140 Type 2 diabetes mellitus with diabetic neuropathy, unspecified: Secondary | ICD-10-CM | POA: Diagnosis not present

## 2019-07-21 DIAGNOSIS — I2510 Atherosclerotic heart disease of native coronary artery without angina pectoris: Secondary | ICD-10-CM | POA: Diagnosis not present

## 2019-07-21 DIAGNOSIS — S298XXA Other specified injuries of thorax, initial encounter: Secondary | ICD-10-CM | POA: Diagnosis not present

## 2019-07-21 DIAGNOSIS — N401 Enlarged prostate with lower urinary tract symptoms: Secondary | ICD-10-CM

## 2019-07-21 DIAGNOSIS — G629 Polyneuropathy, unspecified: Secondary | ICD-10-CM

## 2019-07-21 DIAGNOSIS — E1142 Type 2 diabetes mellitus with diabetic polyneuropathy: Secondary | ICD-10-CM

## 2019-07-21 DIAGNOSIS — I25810 Atherosclerosis of coronary artery bypass graft(s) without angina pectoris: Secondary | ICD-10-CM | POA: Diagnosis not present

## 2019-07-21 DIAGNOSIS — N400 Enlarged prostate without lower urinary tract symptoms: Secondary | ICD-10-CM | POA: Diagnosis present

## 2019-07-21 DIAGNOSIS — M150 Primary generalized (osteo)arthritis: Secondary | ICD-10-CM

## 2019-07-21 DIAGNOSIS — Z87891 Personal history of nicotine dependence: Secondary | ICD-10-CM

## 2019-07-21 DIAGNOSIS — S0993XA Unspecified injury of face, initial encounter: Secondary | ICD-10-CM | POA: Diagnosis not present

## 2019-07-21 LAB — COMPREHENSIVE METABOLIC PANEL
ALT: 31 U/L (ref 0–44)
AST: 35 U/L (ref 15–41)
Albumin: 3.4 g/dL — ABNORMAL LOW (ref 3.5–5.0)
Alkaline Phosphatase: 75 U/L (ref 38–126)
Anion gap: 8 (ref 5–15)
BUN: 31 mg/dL — ABNORMAL HIGH (ref 8–23)
CO2: 27 mmol/L (ref 22–32)
Calcium: 9.1 mg/dL (ref 8.9–10.3)
Chloride: 103 mmol/L (ref 98–111)
Creatinine, Ser: 1.87 mg/dL — ABNORMAL HIGH (ref 0.61–1.24)
GFR calc Af Amer: 38 mL/min — ABNORMAL LOW (ref >60)
GFR calc non Af Amer: 33 mL/min — ABNORMAL LOW (ref >60)
Glucose, Bld: 165 mg/dL — ABNORMAL HIGH (ref 70–99)
Potassium: 4 mmol/L (ref 3.5–5.1)
Sodium: 138 mmol/L (ref 135–145)
Total Bilirubin: 1.4 mg/dL — ABNORMAL HIGH (ref 0.3–1.2)
Total Protein: 6.2 g/dL — ABNORMAL LOW (ref 6.5–8.1)

## 2019-07-21 LAB — CBC WITH DIFFERENTIAL/PLATELET
Abs Immature Granulocytes: 0.07 10*3/uL (ref 0.00–0.07)
Basophils Absolute: 0 10*3/uL (ref 0.0–0.1)
Basophils Relative: 0 %
Eosinophils Absolute: 0.1 10*3/uL (ref 0.0–0.5)
Eosinophils Relative: 1 %
HCT: 41.1 % (ref 39.0–52.0)
Hemoglobin: 13.5 g/dL (ref 13.0–17.0)
Immature Granulocytes: 1 %
Lymphocytes Relative: 11 %
Lymphs Abs: 0.9 10*3/uL (ref 0.7–4.0)
MCH: 29.3 pg (ref 26.0–34.0)
MCHC: 32.8 g/dL (ref 30.0–36.0)
MCV: 89.3 fL (ref 80.0–100.0)
Monocytes Absolute: 0.6 10*3/uL (ref 0.1–1.0)
Monocytes Relative: 7 %
Neutro Abs: 6.6 10*3/uL (ref 1.7–7.7)
Neutrophils Relative %: 80 %
Platelets: 163 10*3/uL (ref 150–400)
RBC: 4.6 MIL/uL (ref 4.22–5.81)
RDW: 15.8 % — ABNORMAL HIGH (ref 11.5–15.5)
WBC: 8.3 10*3/uL (ref 4.0–10.5)
nRBC: 0 % (ref 0.0–0.2)

## 2019-07-21 LAB — CK: Total CK: 268 U/L (ref 49–397)

## 2019-07-21 LAB — RESPIRATORY PANEL BY RT PCR (FLU A&B, COVID)
Influenza A by PCR: NEGATIVE
Influenza B by PCR: NEGATIVE
SARS Coronavirus 2 by RT PCR: NEGATIVE

## 2019-07-21 LAB — PROTIME-INR
INR: 1.5 — ABNORMAL HIGH (ref 0.8–1.2)
Prothrombin Time: 17.6 seconds — ABNORMAL HIGH (ref 11.4–15.2)

## 2019-07-21 LAB — TROPONIN I (HIGH SENSITIVITY)
Troponin I (High Sensitivity): 46 ng/L — ABNORMAL HIGH (ref <18)
Troponin I (High Sensitivity): 44 ng/L — ABNORMAL HIGH (ref <18)
Troponin I (High Sensitivity): 42 ng/L — ABNORMAL HIGH (ref <18)

## 2019-07-21 LAB — GLUCOSE, CAPILLARY
Glucose-Capillary: 166 mg/dL — ABNORMAL HIGH (ref 70–99)
Glucose-Capillary: 174 mg/dL — ABNORMAL HIGH (ref 70–99)

## 2019-07-21 LAB — BRAIN NATRIURETIC PEPTIDE: B Natriuretic Peptide: 3835 pg/mL — ABNORMAL HIGH (ref 0.0–100.0)

## 2019-07-21 MED ORDER — FINASTERIDE 5 MG PO TABS
5.0000 mg | ORAL_TABLET | Freq: Every day | ORAL | Status: DC
  Administered 2019-07-21 – 2019-07-22 (×2): 5 mg via ORAL
  Filled 2019-07-21 (×3): qty 1

## 2019-07-21 MED ORDER — MAGNESIUM OXIDE 400 MG PO TABS
400.0000 mg | ORAL_TABLET | Freq: Every day | ORAL | Status: DC | PRN
  Filled 2019-07-21: qty 1

## 2019-07-21 MED ORDER — BUSPIRONE HCL 5 MG PO TABS
5.0000 mg | ORAL_TABLET | Freq: Every evening | ORAL | Status: DC | PRN
  Administered 2019-07-21 – 2019-07-22 (×2): 5 mg via ORAL
  Filled 2019-07-21 (×4): qty 1

## 2019-07-21 MED ORDER — ACETAMINOPHEN 650 MG RE SUPP: 650.0000 mg | Freq: Four times a day (QID) | RECTAL | Status: DC | PRN

## 2019-07-21 MED ORDER — GABAPENTIN 100 MG PO CAPS
200.0000 mg | ORAL_CAPSULE | Freq: Every morning | ORAL | Status: DC
  Administered 2019-07-22: 200 mg via ORAL
  Filled 2019-07-21: qty 2

## 2019-07-21 MED ORDER — GABAPENTIN 100 MG PO CAPS
100.0000 mg | ORAL_CAPSULE | Freq: Every day | ORAL | Status: DC
  Administered 2019-07-21 – 2019-07-22 (×2): 100 mg via ORAL
  Filled 2019-07-21 (×3): qty 1

## 2019-07-21 MED ORDER — ONDANSETRON HCL 4 MG/2ML IJ SOLN: 4.0000 mg | Freq: Four times a day (QID) | INTRAMUSCULAR | Status: DC | PRN

## 2019-07-21 MED ORDER — INSULIN ASPART 100 UNIT/ML ~~LOC~~ SOLN
4.0000 [IU] | Freq: Three times a day (TID) | SUBCUTANEOUS | Status: DC
  Filled 2019-07-21 (×2): qty 1

## 2019-07-21 MED ORDER — POTASSIUM 99 MG PO TABS: 99.0000 mg | ORAL_TABLET | Freq: Every day | ORAL | Status: DC

## 2019-07-21 MED ORDER — GABAPENTIN 100 MG PO CAPS
100.0000 mg | ORAL_CAPSULE | Freq: Every evening | ORAL | Status: DC | PRN
  Filled 2019-07-21: qty 1

## 2019-07-21 MED ORDER — TAMSULOSIN HCL 0.4 MG PO CAPS
0.4000 mg | ORAL_CAPSULE | Freq: Every day | ORAL | Status: DC
  Administered 2019-07-21 – 2019-07-22 (×2): 0.4 mg via ORAL
  Filled 2019-07-21 (×2): qty 1

## 2019-07-21 MED ORDER — NITROGLYCERIN 0.4 MG SL SUBL: 0.4000 mg | SUBLINGUAL_TABLET | SUBLINGUAL | Status: DC | PRN

## 2019-07-21 MED ORDER — FLUTICASONE PROPIONATE 50 MCG/ACT NA SUSP
2.0000 | Freq: Every day | NASAL | Status: DC | PRN
  Filled 2019-07-21: qty 16

## 2019-07-21 MED ORDER — ACETAMINOPHEN 325 MG PO TABS
650.0000 mg | ORAL_TABLET | Freq: Once | ORAL | Status: AC
  Administered 2019-07-21: 650 mg via ORAL
  Filled 2019-07-21: qty 2

## 2019-07-21 MED ORDER — GABAPENTIN 100 MG PO CAPS: 100.0000 mg | ORAL_CAPSULE | ORAL | Status: DC

## 2019-07-21 MED ORDER — INSULIN ASPART 100 UNIT/ML ~~LOC~~ SOLN
0.0000 [IU] | Freq: Three times a day (TID) | SUBCUTANEOUS | Status: DC
  Administered 2019-07-22 (×2): 2 [IU] via SUBCUTANEOUS
  Administered 2019-07-23: 3 [IU] via SUBCUTANEOUS
  Filled 2019-07-21 (×3): qty 1

## 2019-07-21 MED ORDER — ASPIRIN EC 81 MG PO TBEC
81.0000 mg | DELAYED_RELEASE_TABLET | Freq: Every day | ORAL | Status: DC
  Administered 2019-07-21 – 2019-07-22 (×2): 81 mg via ORAL
  Filled 2019-07-21 (×2): qty 1

## 2019-07-21 MED ORDER — CLOPIDOGREL BISULFATE 75 MG PO TABS
75.0000 mg | ORAL_TABLET | Freq: Every day | ORAL | Status: DC
  Administered 2019-07-21 – 2019-07-22 (×2): 75 mg via ORAL
  Filled 2019-07-21 (×2): qty 1

## 2019-07-21 MED ORDER — ATORVASTATIN CALCIUM 20 MG PO TABS
10.0000 mg | ORAL_TABLET | Freq: Every day | ORAL | Status: DC
  Administered 2019-07-21 – 2019-07-22 (×2): 10 mg via ORAL
  Filled 2019-07-21 (×2): qty 1

## 2019-07-21 MED ORDER — FUROSEMIDE 10 MG/ML IJ SOLN
40.0000 mg | Freq: Two times a day (BID) | INTRAMUSCULAR | Status: DC
  Administered 2019-07-21 – 2019-07-22 (×2): 40 mg via INTRAVENOUS
  Filled 2019-07-21 (×2): qty 4

## 2019-07-21 MED ORDER — ONDANSETRON HCL 4 MG PO TABS: 4.0000 mg | ORAL_TABLET | Freq: Four times a day (QID) | ORAL | Status: DC | PRN

## 2019-07-21 MED ORDER — BACLOFEN 10 MG PO TABS
10.0000 mg | ORAL_TABLET | Freq: Three times a day (TID) | ORAL | Status: DC | PRN
  Administered 2019-07-21 – 2019-07-22 (×2): 10 mg via ORAL
  Filled 2019-07-21 (×4): qty 1

## 2019-07-21 MED ORDER — BENAZEPRIL HCL 20 MG PO TABS
40.0000 mg | ORAL_TABLET | Freq: Every day | ORAL | Status: DC
  Filled 2019-07-21 (×2): qty 2

## 2019-07-21 MED ORDER — ACETAMINOPHEN 325 MG PO TABS
650.0000 mg | ORAL_TABLET | Freq: Four times a day (QID) | ORAL | Status: DC | PRN
  Administered 2019-07-22: 02:00:00 650 mg via ORAL
  Filled 2019-07-21: qty 2

## 2019-07-21 MED ORDER — HYDROXYZINE HCL 10 MG PO TABS
10.0000 mg | ORAL_TABLET | Freq: Three times a day (TID) | ORAL | Status: DC | PRN
  Filled 2019-07-21 (×2): qty 1

## 2019-07-21 NOTE — ED Triage Notes (Addendum)
Pt from home via [facility]. Per EMS, pt st [facility] ball" [facility] where pt attempted to step over a 2x6, trip over it and fell forward on his face due to legs feeling "heavy". Pitting edema +2 noted, pt st seen his PCP over a month ago. Pt st . Missing 2 front teeth, abrasions noted on note, lips, BI hands. NAD noted at this time. Pt A/Ox4, speaking in clear sentences at this time.

## 2019-07-21 NOTE — ED Notes (Signed)
Admitting MD in with pt    

## 2019-07-21 NOTE — H&P (Signed)
History and Physical    Randy Howard [id number] DOB: [birth year] DOA: 07/21/2019  PCP: Olin Hauser, DO   Patient coming from: Home  I have personally briefly reviewed patient's old medical records in [place]  Chief Complaint: S/P Fall                                Shortness of breath  HPI: Randy Howard is a 79 y.o. male with medical history significant for CAD s/p CABG, PVD, OSA, emphysema who presents to the ER for evaluation after he fell landing on his face and anterior chest wall. Patient was playing bocce ball and tripped and fell landing on his face and chest.  He has bilateral lower extremity swelling which is chronic and he states is slightly worse than his baseline and he is always short of breath with minimal exertion. Patient has an elevated troponin but 12 Lead EKG did not show any acute findings. Patient complains of chest soreness from the fall but denies having any orthopnea, palpitations, diaphoresis, nausea or vomiting.   ED Course: Patient seen in the emergency room following a fall after he landed on his face and chest.  Patient is on Plavix and was noted to have blood around his nose and mouth.  ER provider was concerned about possible CHF exacerbation and requested admission  Review of Systems: As per HPI otherwise 10 point review of systems negative.    Past Medical History:  Diagnosis Date  . Arthritis   . Coronary artery disease   . Dyspnea   . Elevated lipids   . Hyperlipidemia   . Hypertension   . Neuropathy   . Nocturia   . Obstructive sleep apnea   . Peripheral vascular disease ([place])   . Prostate enlargement   . PVD (peripheral vascular disease) ([place])   . Right carotid bruit   . Skin cancer    Followed by Dr. Evorn Gong  . Skin cancer   . Tremor     Past Surgical History:  Procedure Laterality Date  . CARDIAC CATHETERIZATION    . CAROTID ARTERY ANGIOPLASTY Left 09/30/2011  . CORONARY ARTERY BYPASS GRAFT  2006  .  ENDARTERECTOMY FEMORAL Bilateral 11/01/2014   Procedure: ENDARTERECTOMY FEMORAL;  Surgeon: Algernon Huxley, MD;  Location: [facility];  Service: Vascular;  Laterality: Bilateral;  . LOWER EXTREMITY ANGIOGRAPHY Left 04/26/2018   Procedure: LOWER EXTREMITY ANGIOGRAPHY;  Surgeon: Algernon Huxley, MD;  Location: [place] [facility];  Service: Cardiovascular;  Laterality: Left;  . LOWER EXTREMITY ANGIOGRAPHY Right 09/23/2018   Procedure: LOWER EXTREMITY ANGIOGRAPHY;  Surgeon: Algernon Huxley, MD;  Location: [place] [facility];  Service: Cardiovascular;  Laterality: Right;  . LOWER EXTREMITY ANGIOGRAPHY Right 11/12/2018   Procedure: LOWER EXTREMITY ANGIOGRAPHY;  Surgeon: Algernon Huxley, MD;  Location: [place] [facility];  Service: Cardiovascular;  Laterality: Right;  . PERIPHERAL VASCULAR CATHETERIZATION Left 10/09/2014   Procedure: Lower Extremity Angiography;  Surgeon: Algernon Huxley, MD;  Location: [place] [facility];  Service: Cardiovascular;  Laterality: Left;  . PERIPHERAL VASCULAR CATHETERIZATION  10/09/2014   Procedure: Lower Extremity Intervention;  Surgeon: Algernon Huxley, MD;  Location: [place] [facility];  Service: Cardiovascular;;     reports that he quit smoking about 34 years ago. His smoking use included cigarettes. He has a 25.00 pack-year smoking history. He has quit using smokeless tobacco. He reports that he  does not drink alcohol or use drugs.  Allergies  Allergen Reactions  . Oxycodone Nausea Only    Family History  Problem Relation Age of Onset  . Heart disease Sister   . Heart disease Sister   . Diabetes Sister   . Diabetes Sister   . Diabetes Brother      Prior to Admission medications   Medication Sig Start Date End Date Taking? Authorizing Provider  aspirin EC 81 MG tablet Take 1 tablet (81 mg total) by mouth daily. 04/26/18  Yes Dew, Erskine Squibb, MD  atorvastatin (LIPITOR) 10 MG tablet Take 10 mg by mouth daily.   Yes [provider]  baclofen (LIORESAL) 10 MG tablet TAKE 1  TABLET BY MOUTH THREE TIMES DAILYAS NEEDED Patient taking differently: Take 10 mg by mouth 3 (three) times daily as needed for muscle spasms.  11/04/18  Yes Karamalegos, Alexander J, DO  benazepril (LOTENSIN) 40 MG tablet TAKE 1 TABLET BY MOUTH ONCE DAILY. Patient taking differently: Take 40 mg by mouth daily.  03/04/19  Yes Johnson, Megan P, DO  busPIRone (BUSPAR) 5 MG tablet TAKE 1 TABLET BY MOUTH ONCE DAILY AS NEEDED. Patient taking differently: Take 5 mg by mouth at bedtime as needed (anxiety).  06/08/19  Yes Karamalegos, Devonne Doughty, DO  clopidogrel (PLAVIX) 75 MG tablet TAKE 1 TABLET BY MOUTH ONCE DAILY. Patient taking differently: Take 75 mg by mouth daily.  08/16/18  Yes Karamalegos, Devonne Doughty, DO  finasteride (PROSCAR) 5 MG tablet TAKE 1 TABLET BY MOUTH ONCE DAILY Patient taking differently: Take 5 mg by mouth daily.  03/04/19  Yes Johnson, Megan P, DO  fluticasone (FLONASE) 50 MCG/ACT nasal spray Place 2 sprays into both nostrils daily. Use for 4-6 weeks then stop and use seasonally or as needed. 06/08/19  Yes Karamalegos, Devonne Doughty, DO  gabapentin (NEURONTIN) 100 MG capsule TAKE 2 CAPSULES BY MOUTH IN THE MORNING AND 1-2 CAPSULES AT BEDTIME. Patient taking differently: Take 100-200 mg by mouth See admin instructions. Take 2 capsules (200mg ) by mouth every morning and take 1 to 2 capsules (100mg -200mg ) by mouth every night 09/10/18  Yes Karamalegos, Alexander J, DO  glimepiride (AMARYL) 4 MG tablet TAKE 1 TABLET BY MOUTH ONCE EVERY MORNING Patient taking differently: Take 4 mg by mouth daily.  01/16/19  Yes Karamalegos, Devonne Doughty, DO  hydrOXYzine (ATARAX/VISTARIL) 10 MG tablet TAKE 1 TABLET BY MOUTH 3 TIMES DAILY AS NEEDED Patient taking differently: Take 10 mg by mouth 3 (three) times daily as needed for itching or anxiety.  05/11/19  Yes Karamalegos, Devonne Doughty, DO  Magnesium Oxide, Antacid, 500 MG CAPS Take 500 mg by mouth daily as needed (stomach acid).    Yes [provider]   nitroGLYCERIN (NITROSTAT) 0.4 MG SL tablet Place 1 tablet (0.4 mg total) under the tongue every 5 (five) minutes as needed for chest pain. 10/18/15  Yes Arlis Porta., MD  Potassium 99 MG TABS Take 99 mg by mouth daily.    Yes [provider]  tamsulosin (FLOMAX) 0.4 MG CAPS capsule TAKE 1 CAPSULE BY MOUTH ONCE DAILY 30 MINUTES AFTER LARGEST MEAL. Patient taking differently: Take 0.4 mg by mouth daily.  05/11/19  Yes Karamalegos, Devonne Doughty, DO  torsemide (DEMADEX) 20 MG tablet Take 20 mg by mouth daily. (take 1 additional tablet (20mg ) by mouth daily as needed for excess weight)   Yes [provider]    Physical Exam: Vitals:   07/21/19 1141 07/21/19 1142 07/21/19 1636  BP: 133/67  (!) 123/94  Pulse: (!) 57  68  Resp: 18  16  Temp: 98.1 F (36.7 C)    TempSrc: Oral    SpO2: 99%  94%  Weight:  74.8 kg   Height:  5\' 11"  (1.803 m)      Vitals:   07/21/19 1141 07/21/19 1142 07/21/19 1636  BP: 133/67  (!) 123/94  Pulse: (!) 57  68  Resp: 18  16  Temp: 98.1 F (36.7 C)    TempSrc: Oral    SpO2: 99%  94%  Weight:  74.8 kg   Height:  5\' 11"  (1.803 m)     Constitutional: NAD, alert and oriented x 3 Eyes: PERRL, lids and conjunctivae normal ENMT: Mucous membranes are moist.  Neck: normal, supple, no masses, no thyromegaly Respiratory: clear to auscultation bilaterally, no wheezing, no crackles. Normal respiratory effort. No accessory muscle use.  Cardiovascular: Regular rate and rhythm, no murmurs / rubs / gallops. 3+ extremity edema. 2+ pedal pulses. No carotid bruits.  Abdomen: no tenderness, no masses palpated. No hepatosplenomegaly. Bowel sounds positive.  Musculoskeletal: no clubbing / cyanosis. No joint deformity upper and lower extremities.  Ecchymosis over forearms and legs Skin: no rashes, lesions, ulcers.  Neurologic: No gross focal neurologic deficit. Psychiatric: Normal mood and affect.   Labs on Admission: I have personally reviewed  following labs and imaging studies  CBC: Recent Labs  Lab 07/21/19 1315  WBC 8.3  NEUTROABS 6.6  HGB 13.5  HCT 41.1  MCV 89.3  PLT [id number]   Basic Metabolic Panel: Recent Labs  Lab 07/21/19 1315  NA 138  K 4.0  CL 103  CO2 27  GLUCOSE 165*  BUN 31*  CREATININE 1.87*  CALCIUM 9.1   GFR: Estimated Creatinine Clearance: 33.3 mL/min (A) (by C-G formula based on SCr of 1.87 mg/dL (H)). Liver Function Tests: Recent Labs  Lab 07/21/19 1315  AST 35  ALT 31  ALKPHOS 75  BILITOT 1.4*  PROT 6.2*  ALBUMIN 3.4*   No results for input(s): LIPASE, AMYLASE in the last 168 hours. No results for input(s): AMMONIA in the last 168 hours. Coagulation Profile: Recent Labs  Lab 07/21/19 1315  INR 1.5*   Cardiac Enzymes: No results for input(s): CKTOTAL, CKMB, CKMBINDEX, TROPONINI in the last 168 hours. BNP (last 3 results) No results for input(s): PROBNP in the last 8760 hours. HbA1C: No results for input(s): HGBA1C in the last 72 hours. CBG: Recent Labs  Lab 07/21/19 1632  GLUCAP 166*   Lipid Profile: No results for input(s): CHOL, HDL, LDLCALC, TRIG, CHOLHDL, LDLDIRECT in the last 72 hours. Thyroid Function Tests: No results for input(s): TSH, T4TOTAL, FREET4, T3FREE, THYROIDAB in the last 72 hours. Anemia Panel: No results for input(s): VITAMINB12, FOLATE, FERRITIN, TIBC, IRON, RETICCTPCT in the last 72 hours. Urine analysis: No results found for: COLORURINE, APPEARANCEUR, LABSPEC, PHURINE, GLUCOSEU, HGBUR, BILIRUBINUR, KETONESUR, PROTEINUR, UROBILINOGEN, NITRITE, LEUKOCYTESUR  Radiological Exams on Admission: CT Head Wo Contrast  Result Date: 07/21/2019 CLINICAL DATA:  Fall, facial trauma EXAM: CT HEAD WITHOUT CONTRAST CT MAXILLOFACIAL WITHOUT CONTRAST CT CERVICAL SPINE WITHOUT CONTRAST TECHNIQUE: Multidetector CT imaging of the head, cervical spine, and maxillofacial structures were performed using the standard protocol without intravenous contrast. Multiplanar CT  image reconstructions of the cervical spine and maxillofacial structures were also generated. COMPARISON:  None. FINDINGS: CT HEAD FINDINGS Brain: No evidence of acute infarction, hemorrhage, hydrocephalus, extra-axial collection or mass lesion/mass effect. Mild age related cerebral volume loss. Vascular:  Atherosclerotic calcifications involving the large vessels of the skull base. No unexpected hyperdense vessel. Skull: Normal. Negative for fracture or focal lesion. Other: None. CT MAXILLOFACIAL FINDINGS Osseous: No fracture or mandibular dislocation. Bony orbital walls are intact. Maxillary sinus walls intact. No destructive process. Orbits: Negative. No traumatic or inflammatory finding. Sinuses: Paranasal sinuses and mastoid air cells are clear without mucosal thickening, opacification, or air-fluid level. Soft tissues: No focal soft tissue hematoma. CT CERVICAL SPINE FINDINGS Alignment: Physiologic alignment without static listhesis. Facet joints are aligned without dislocation. Skull base and vertebrae: No acute fracture. No primary bone lesion or focal pathologic process. The right C3-4 facet joint is fused. Soft tissues and spinal canal: No prevertebral fluid or swelling. No visible canal hematoma. Disc levels: Multilevel intervertebral disc height loss with degenerative endplate spurring. Mild multilevel facet arthropathy. No evidence of high-grade foraminal or canal stenosis by CT. Upper chest: Visualized lung apices clear. Other: Vertebral and carotid artery atherosclerosis. IMPRESSION: 1. No acute intracranial findings. 2. No evidence of acute maxillofacial bone fracture. 3. No acute fracture or static listhesis of the cervical spine. 4. 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 Electronically Signed   By: Davina Poke D.O.   On: 07/21/2019 13:18   CT Chest Wo Contrast  Result Date: 07/21/2019 CLINICAL DATA:  Fall. EXAM: CT CHEST WITHOUT CONTRAST TECHNIQUE:  Multidetector CT imaging of the chest was performed following the standard protocol without IV contrast. COMPARISON:  None FINDINGS: Cardiovascular: There is mild cardiac enlargement. Aortic atherosclerosis. Previous median sternotomy the and CABG procedure. Extensive aortic atherosclerosis. Mediastinum/Nodes: No enlarged mediastinal or axillary lymph nodes. Thyroid gland, trachea, and esophagus demonstrate no significant findings. Lungs/Pleura: Mild changes of emphysema. No airspace consolidation, atelectasis or pneumothorax. Upper Abdomen: No acute abnormality. Aortic atherosclerosis. Musculoskeletal: Previous median sternotomy. Marked thoracic kyphosis. Multi level disc space narrowing and endplate spurring identified. No acute findings IMPRESSION: 1. No acute cardiopulmonary abnormalities. 2. Emphysema and aortic atherosclerosis. 3. Marked thoracic kyphosis. Aortic Atherosclerosis (ICD10-I70.0) and Emphysema (ICD10-J43.9). Electronically Signed   By: Kerby Moors M.D.   On: 07/21/2019 15:02   CT Cervical Spine Wo Contrast  Result Date: 07/21/2019 CLINICAL DATA:  Fall, facial trauma EXAM: CT HEAD WITHOUT CONTRAST CT MAXILLOFACIAL WITHOUT CONTRAST CT CERVICAL SPINE WITHOUT CONTRAST TECHNIQUE: Multidetector CT imaging of the head, cervical spine, and maxillofacial structures were performed using the standard protocol without intravenous contrast. Multiplanar CT image reconstructions of the cervical spine and maxillofacial structures were also generated. COMPARISON:  None. FINDINGS: CT HEAD FINDINGS Brain: No evidence of acute infarction, hemorrhage, hydrocephalus, extra-axial collection or mass lesion/mass effect. Mild age related cerebral volume loss. Vascular: Atherosclerotic calcifications involving the large vessels of the skull base. No unexpected hyperdense vessel. Skull: Normal. Negative for fracture or focal lesion. Other: None. CT MAXILLOFACIAL FINDINGS Osseous: No fracture or mandibular dislocation.  Bony orbital walls are intact. Maxillary sinus walls intact. No destructive process. Orbits: Negative. No traumatic or inflammatory finding. Sinuses: Paranasal sinuses and mastoid air cells are clear without mucosal thickening, opacification, or air-fluid level. Soft tissues: No focal soft tissue hematoma. CT CERVICAL SPINE FINDINGS Alignment: Physiologic alignment without static listhesis. Facet joints are aligned without dislocation. Skull base and vertebrae: No acute fracture. No primary bone lesion or focal pathologic process. The right C3-4 facet joint is fused. Soft tissues and spinal canal: No prevertebral fluid or swelling. No visible canal hematoma. Disc levels: Multilevel intervertebral disc height loss with degenerative endplate spurring. Mild multilevel facet arthropathy. No  evidence of high-grade foraminal or canal stenosis by CT. Upper chest: Visualized lung apices clear. Other: Vertebral and carotid artery atherosclerosis. IMPRESSION: 1. No acute intracranial findings. 2. No evidence of acute maxillofacial bone fracture. 3. No acute fracture or static listhesis of the cervical spine. 4. 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 Electronically Signed   By: Davina Poke D.O.   On: 07/21/2019 13:18   DG Chest Portable 1 View  Result Date: 07/21/2019 CLINICAL DATA:  Pain. Fall. Patient tripped and fell forward on his face. Legs feel heavy. Pitting edema. EXAM: PORTABLE CHEST 1 VIEW COMPARISON:  11/05/2015 FINDINGS: Previous median sternotomy and CABG. The heart is enlarged, increased in size compared with the previous study. There is significant atherosclerotic calcification of the thoracic aorta. The lungs are clear. No focal consolidation or pulmonary edema. No acute displaced fracture. No pneumothorax. IMPRESSION: 1. Cardiomegaly. 2. No evidence for acute abnormality. Electronically Signed   By: Nolon Nations M.D.   On: 07/21/2019 13:14   CT  Maxillofacial Wo Contrast  Result Date: 07/21/2019 CLINICAL DATA:  Fall, facial trauma EXAM: CT HEAD WITHOUT CONTRAST CT MAXILLOFACIAL WITHOUT CONTRAST CT CERVICAL SPINE WITHOUT CONTRAST TECHNIQUE: Multidetector CT imaging of the head, cervical spine, and maxillofacial structures were performed using the standard protocol without intravenous contrast. Multiplanar CT image reconstructions of the cervical spine and maxillofacial structures were also generated. COMPARISON:  None. FINDINGS: CT HEAD FINDINGS Brain: No evidence of acute infarction, hemorrhage, hydrocephalus, extra-axial collection or mass lesion/mass effect. Mild age related cerebral volume loss. Vascular: Atherosclerotic calcifications involving the large vessels of the skull base. No unexpected hyperdense vessel. Skull: Normal. Negative for fracture or focal lesion. Other: None. CT MAXILLOFACIAL FINDINGS Osseous: No fracture or mandibular dislocation. Bony orbital walls are intact. Maxillary sinus walls intact. No destructive process. Orbits: Negative. No traumatic or inflammatory finding. Sinuses: Paranasal sinuses and mastoid air cells are clear without mucosal thickening, opacification, or air-fluid level. Soft tissues: No focal soft tissue hematoma. CT CERVICAL SPINE FINDINGS Alignment: Physiologic alignment without static listhesis. Facet joints are aligned without dislocation. Skull base and vertebrae: No acute fracture. No primary bone lesion or focal pathologic process. The right C3-4 facet joint is fused. Soft tissues and spinal canal: No prevertebral fluid or swelling. No visible canal hematoma. Disc levels: Multilevel intervertebral disc height loss with degenerative endplate spurring. Mild multilevel facet arthropathy. No evidence of high-grade foraminal or canal stenosis by CT. Upper chest: Visualized lung apices clear. Other: Vertebral and carotid artery atherosclerosis. IMPRESSION: 1. No acute intracranial findings. 2. No evidence of  acute maxillofacial bone fracture. 3. No acute fracture or static listhesis of the cervical spine. 4. 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 Electronically Signed   By: Davina Poke D.O.   On: 07/21/2019 13:18    EKG: Independently reviewed.  Sinus rhythm Right bundle branch block  Assessment/Plan Principal Problem:   Fall at home, initial encounter Active Problems:   Hypertension   Chronic venous insufficiency   CAD (coronary artery disease)   Acute CHF (congestive heart failure) (HCC)   Type 2 diabetes mellitus with stage 3 chronic kidney disease (HCC)   BPH (benign prostatic hyperplasia)   Fall    Fall Patient presented to the ER after he sustained a mechanical fall and landed face forward. He had complaints of pain involving anterior chest wall with imaging study is negative for rib fractures or maxillofacial fractures Fall precautions PT evaluation  Chronic venous insufficiency Patient has bilateral lower extremity swelling which is chronic but worse when compared to his baseline Elevate lower extremities TED stockings   Diabetes mellitus type 2 with complications of chronic kidney disease stage III Maintain consistent carbohydrate diet Sliding scale coverage   Coronary artery disease/PAD Continue statins, aspirin and Plavix   History of congestive heart failure Unclear etiology Obtain 2D echocardiogram to assess LVEF Continue benazepril, diuretic therapy and as needed nitroglycerin   BPH Continue finasteride and Flomax   Elevated troponin Unclear etiology Serial cardiac enzymes Obtain 2D echocardiogram to rule out regional wall motion abnormality Consult cardiology  DVT prophylaxis: SCD Code Status: Full Family Communication: Greater than 50% of time was spent discussing plan of care with patient at the bedside. All questions and concerns were addressed Disposition Plan: Back to previous home  environment Consults called: Cardiology   Collier Bullock MD [facility]     07/21/2019, 5:38 PM

## 2019-07-21 NOTE — ED Notes (Signed)
Verbal order for repeat EKG by Dr Joan Mayans.

## 2019-07-21 NOTE — ED Notes (Signed)
States he is feeling a little shakey  Crackers and g'ale  conts to have scanr amt of bleeding from mouth

## 2019-07-21 NOTE — ED Notes (Signed)
Pt in CT.

## 2019-07-21 NOTE — ED Provider Notes (Signed)
[facility] Emergency Department Provider Note  ____________________________________________   First MD Initiated Contact with Patient 07/21/19 1216     (approximate)  I have reviewed the triage vital signs and the nursing notes.   HISTORY  Chief Complaint Fall    HPI Randy Howard is a 79 y.o. male [facility] emergency department [facility].  Patient was playing bocce ball and tripped and fell forwards landing on his face and chest.  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  Has some swelling of the lower extremities which is most likely chronic.  Patient is also on Plavix.    Past Medical History:  Diagnosis Date  . Arthritis   . Coronary artery disease   . Dyspnea   . Elevated lipids   . Hyperlipidemia   . Hypertension   . Neuropathy   . Nocturia   . Obstructive sleep apnea   . Peripheral vascular disease ([place])   . Prostate enlargement   . PVD (peripheral vascular disease) ([place])   . Right carotid bruit   . Skin cancer    Followed by Dr. Evorn Gong  . Skin cancer   . Tremor     Patient Active Problem List   Diagnosis Date Noted  . Atherosclerotic vascular disease 09/23/2018  . Atherosclerosis of native arteries of extremity with rest pain ([place]) 09/10/2018  . Swelling of limb 09/01/2017  . CKD (chronic kidney disease), stage III 06/09/2017  . BPH (benign prostatic hyperplasia) 12/29/2016  . Insomnia 11/12/2016  . Osteoarthritis of multiple joints 07/28/2016  . Neuropathy 07/04/2016  . Early satiety 11/05/2015  . Weight loss 11/05/2015  . Benign head tremor 07/30/2015  . Tremor 06/04/2015  . Inguinal hernia 06/04/2015  . Anxiety disorder 03/07/2015  . CHF (congestive heart failure) ([place]) 12/26/2014  . Hypertension 10/06/2014  . Elevated cholesterol 10/06/2014  . Chronic venous insufficiency 10/06/2014  . Peripheral vascular  disease ([place]) 10/06/2014  . Difficulty in walking 08/18/2013  . Dizziness 08/18/2013  . Arteriosclerosis of coronary artery 08/16/2013  . Cardiac murmur 08/16/2013  . Breath shortness 08/16/2013  . Chest pain 08/16/2013  . Type 2 diabetes mellitus with diabetic polyneuropathy, without long-term current use of insulin ([place]) 08/16/2013    Past Surgical History:  Procedure Laterality Date  . CARDIAC CATHETERIZATION    . CAROTID ARTERY ANGIOPLASTY Left 09/30/2011  . CORONARY ARTERY BYPASS GRAFT  2006  . ENDARTERECTOMY FEMORAL Bilateral 11/01/2014   Procedure: ENDARTERECTOMY FEMORAL;  Surgeon: Algernon Huxley, MD;  Location: [facility] ORS;  Service: Vascular;  Laterality: Bilateral;  . LOWER EXTREMITY ANGIOGRAPHY Left 04/26/2018   Procedure: LOWER EXTREMITY ANGIOGRAPHY;  Surgeon: Algernon Huxley, MD;  Location: [place] [facility];  Service: Cardiovascular;  Laterality: Left;  . LOWER EXTREMITY ANGIOGRAPHY Right 09/23/2018   Procedure: LOWER EXTREMITY ANGIOGRAPHY;  Surgeon: Algernon Huxley, MD;  Location: [place] [facility];  Service: Cardiovascular;  Laterality: Right;  . LOWER EXTREMITY ANGIOGRAPHY Right 11/12/2018   Procedure: LOWER EXTREMITY ANGIOGRAPHY;  Surgeon: Algernon Huxley, MD;  Location: [place] [facility];  Service: Cardiovascular;  Laterality: Right;  . PERIPHERAL VASCULAR CATHETERIZATION Left 10/09/2014   Procedure: Lower Extremity Angiography;  Surgeon: Algernon Huxley, MD;  Location: [place] [facility];  Service: Cardiovascular;  Laterality: Left;  . PERIPHERAL VASCULAR CATHETERIZATION  10/09/2014   Procedure: Lower Extremity Intervention;  Surgeon: Algernon Huxley, MD;  Location: [place] [facility];  Service: Cardiovascular;;    Prior to Admission medications   Medication Sig Start Date End Date Taking? Authorizing Provider  aspirin EC 81 MG tablet Take 1 tablet (81 mg total) by mouth daily. 04/26/18  Yes Dew, Erskine Squibb, MD  atorvastatin (LIPITOR) 10 MG tablet Take 10 mg by mouth daily.   Yes  [provider]  baclofen (LIORESAL) 10 MG tablet TAKE 1 TABLET BY MOUTH THREE TIMES DAILYAS NEEDED Patient taking differently: Take 10 mg by mouth 3 (three) times daily as needed for muscle spasms.  11/04/18  Yes Karamalegos, Alexander J, DO  benazepril (LOTENSIN) 40 MG tablet TAKE 1 TABLET BY MOUTH ONCE DAILY. Patient taking differently: Take 40 mg by mouth daily.  03/04/19  Yes Johnson, Megan P, DO  busPIRone (BUSPAR) 5 MG tablet TAKE 1 TABLET BY MOUTH ONCE DAILY AS NEEDED. Patient taking differently: Take 5 mg by mouth at bedtime as needed (anxiety).  06/08/19  Yes Karamalegos, Devonne Doughty, DO  clopidogrel (PLAVIX) 75 MG tablet TAKE 1 TABLET BY MOUTH ONCE DAILY. Patient taking differently: Take 75 mg by mouth daily.  08/16/18  Yes Karamalegos, Devonne Doughty, DO  finasteride (PROSCAR) 5 MG tablet TAKE 1 TABLET BY MOUTH ONCE DAILY Patient taking differently: Take 5 mg by mouth daily.  03/04/19  Yes Johnson, Megan P, DO  fluticasone (FLONASE) 50 MCG/ACT nasal spray Place 2 sprays into both nostrils daily. Use for 4-6 weeks then stop and use seasonally or as needed. 06/08/19  Yes Karamalegos, Devonne Doughty, DO  gabapentin (NEURONTIN) 100 MG capsule TAKE 2 CAPSULES BY MOUTH IN THE MORNING AND 1-2 CAPSULES AT BEDTIME. Patient taking differently: Take 100-200 mg by mouth See admin instructions. Take 2 capsules (200mg ) by mouth every morning and take 1 to 2 capsules (100mg -200mg ) by mouth every night 09/10/18  Yes Karamalegos, Alexander J, DO  glimepiride (AMARYL) 4 MG tablet TAKE 1 TABLET BY MOUTH ONCE EVERY MORNING Patient taking differently: Take 4 mg by mouth daily.  01/16/19  Yes Karamalegos, Devonne Doughty, DO  hydrOXYzine (ATARAX/VISTARIL) 10 MG tablet TAKE 1 TABLET BY MOUTH 3 TIMES DAILY AS NEEDED Patient taking differently: Take 10 mg by mouth 3 (three) times daily as needed for itching or anxiety.  05/11/19  Yes Karamalegos, Devonne Doughty, DO  Magnesium Oxide, Antacid, 500 MG CAPS Take 500 mg by  mouth daily as needed (stomach acid).    Yes [provider]  nitroGLYCERIN (NITROSTAT) 0.4 MG SL tablet Place 1 tablet (0.4 mg total) under the tongue every 5 (five) minutes as needed for chest pain. 10/18/15  Yes Arlis Porta., MD  Potassium 99 MG TABS Take 99 mg by mouth daily.    Yes [provider]  tamsulosin (FLOMAX) 0.4 MG CAPS capsule TAKE 1 CAPSULE BY MOUTH ONCE DAILY 30 MINUTES AFTER LARGEST MEAL. Patient taking differently: Take 0.4 mg by mouth daily.  05/11/19  Yes Karamalegos, Devonne Doughty, DO  torsemide (DEMADEX) 20 MG tablet Take 20 mg by mouth daily. (take 1 additional tablet (20mg ) by mouth daily as needed for excess weight)   Yes [provider]    Allergies Oxycodone  Family History  Problem Relation Age of Onset  . Heart disease Sister   . Heart disease Sister   . Diabetes Sister   . Diabetes Sister   . Diabetes Brother     Social History Social History   Tobacco Use  . Smoking status: Former Smoker    Packs/day:  1.00    Years: 25.00    Pack years: 25.00    Types: Cigarettes    Quit date: 03/24/1985    Years since quitting: 34.3  . Smokeless tobacco: Former Network engineer Use Topics  . Alcohol use: No    Alcohol/week: 0.0 standard drinks  . Drug use: No    Review of Systems  Constitutional: No fever/chills Eyes: No visual changes. ENT: No sore throat.  Positive facial injury Respiratory: Denies cough Cardiovascular: Positive chest pain from fall Gastrointestinal: Denies abdominal pain Genitourinary: Negative for dysuria. Musculoskeletal: Negative for back pain. Skin: Negative for rash. Psychiatric: no mood changes,     ____________________________________________   PHYSICAL EXAM:  VITAL SIGNS: ED Triage Vitals  Enc Vitals Group     BP 07/21/19 1141 133/67     Pulse Rate 07/21/19 1141 (!) 57     Resp 07/21/19 1141 18     Temp 07/21/19 1141 98.1 F (36.7 C)     Temp Source 07/21/19 1141 Oral     SpO2  07/21/19 1141 99 %     Weight 07/21/19 1142 165 lb (74.8 kg)     Height 07/21/19 1142 5\' 11"  (1.803 m)     Head Circumference --      Peak Flow --      Pain Score 07/21/19 1141 7     Pain Loc --      Pain Edu? --      Excl. in [place]? --     Constitutional: Alert and oriented. Well appearing and in no acute distress. Eyes: Conjunctivae are normal.  Head: Large amount of blood noted around the nose and mouth, patient has his partials in his lap stating he broke them when he fell,. Nose: No congestion/rhinnorhea.  Positive for blood noted around the nose Mouth/Throat: Mucous membranes are moist.   Neck:  supple no lymphadenopathy noted Cardiovascular: Normal rate, regular rhythm. Heart sounds are normal Respiratory: Normal respiratory effort.  No retractions, lungs c t a  Abd: soft nontender bs normal all 4 quad GU: deferred Musculoskeletal: FROM all extremities, warm and well perfused, chest is mildly tender, C-spine mildly tender, neurovascular is intact Neurologic:  Normal speech and language.  Skin:  Skin is warm, dry  No rash noted. Psychiatric: Mood and affect are normal. Speech and behavior are normal.  ____________________________________________   LABS (all labs ordered are listed, but only abnormal results are displayed)  Labs Reviewed  COMPREHENSIVE METABOLIC PANEL - Abnormal; Notable for the following components:      Result Value   Glucose, Bld 165 (*)    BUN 31 (*)    Creatinine, Ser 1.87 (*)    Total Protein 6.2 (*)    Albumin 3.4 (*)    Total Bilirubin 1.4 (*)    GFR calc non Af Amer 33 (*)    GFR calc Af Amer 38 (*)    All other components within normal limits  CBC WITH DIFFERENTIAL/PLATELET - Abnormal; Notable for the following components:   RDW 15.8 (*)    All other components within normal limits  PROTIME-INR - Abnormal; Notable for the following components:   Prothrombin Time 17.6 (*)    INR 1.5 (*)    All other components within normal limits  BRAIN  NATRIURETIC PEPTIDE - Abnormal; Notable for the following components:   B Natriuretic Peptide 3,835.0 (*)    All other components within normal limits  GLUCOSE, CAPILLARY - Abnormal; Notable for the following components:  Glucose-Capillary 166 (*)    All other components within normal limits  TROPONIN I (HIGH SENSITIVITY) - Abnormal; Notable for the following components:   Troponin I (High Sensitivity) 46 (*)    All other components within normal limits  TROPONIN I (HIGH SENSITIVITY) - Abnormal; Notable for the following components:   Troponin I (High Sensitivity) 44 (*)    All other components within normal limits  RESPIRATORY PANEL BY RT PCR (FLU A&B, COVID)   ____________________________________________   ____________________________________________  RADIOLOGY  CT the head, maxillofacial, and C-spine is negative Chest x-ray is negative  ____________________________________________   PROCEDURES  Procedure(s) performed: No  Procedures    ____________________________________________   INITIAL IMPRESSION / ASSESSMENT AND PLAN / ED COURSE  Pertinent labs & imaging results that were available during my care of the patient were reviewed by me and considered in my medical decision making (see chart for details).   Patient is a 79 year old male presents emergency department via EMS after a fall.  Patient landed directly on his face and chest.  Complaining of chest pain.  Patient is also on Plavix.  See HPI   Physical exam shows him to be very talkative and awake.  Large amount of blood is noted around the nose and mouth.  C-spine is mildly tender, chest is tender across her anterior ribs and sternum.  DDx: Blunt head trauma, blunt chest trauma, facial fractures  CBC, metabolic panel, troponin, PT/INR, EKG CT the head, maxillofacial, C-spine, and chest x-ray ordered  Clinical Course as of Jul 21 1718  Thu Jul 21, 2019  1336 Brain natriuretic peptide [SF]    Clinical  Course User Index [SF] Versie Starks, PA-C   CT of the head, maxillofacial, and C-spine are normal, x-ray of the chest is normal  CBC is basically normal, comprehensive metabolic panel has decreased glucose of 165, BUN and creatinine are elevated, INR is 1.5, troponins increased at 46, second troponin at 44, BNP is 3835, glucose little elevated at 166.  Discussed the findings with the patient and Dr. Joan Mayans.  We agree he needs to be admitted for the congestive heart failure.  CT of the chest was also ordered due to the impact to his chest when he fell.  This appears to be normal also.  Feel patient is stable and just will need some diuresis.  I did call the hospitalist and explained situation.  She accepts the admission.  He was transferred over to C pod awaiting bed.  Randy Howard was evaluated in Emergency Department on 07/21/2019 for the symptoms described in the history of present illness. He was evaluated in the context of the global COVID-19 pandemic, which necessitated consideration that the patient might be at risk for infection with the SARS-CoV-2 virus that causes COVID-19. Institutional protocols and algorithms that pertain to the evaluation of patients at risk for COVID-19 are in a state of rapid change based on information released by regulatory bodies including the CDC and federal and state organizations. These policies and algorithms were followed during the patient's care in the ED.   As part of my medical decision making, I reviewed the following data within the [place] notes reviewed and incorporated, Labs reviewed , EKG interpreted nonspecific ST-T wave changes, Old EKG reviewed, Old chart reviewed, Radiograph reviewed , Discussed with admitting physician , Notes from prior ED visits and [place] Controlled Substance Database  ____________________________________________   FINAL CLINICAL IMPRESSION(S) / ED DIAGNOSES  Final diagnoses:  Systolic congestive  heart failure, unspecified HF chronicity (HCC)  Blunt chest trauma, initial encounter  Contusion of face, initial encounter  Injury of head, initial encounter  Fall, initial encounter      NEW MEDICATIONS STARTED DURING THIS VISIT:  New Prescriptions   No medications on file     Note:  This document was prepared using Dragon voice recognition software and may include unintentional dictation errors.    Versie Starks, PA-C 07/21/19 1720    Lilia Pro., MD 07/22/19 [phone]
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
## 2019-07-21 NOTE — ED Notes (Addendum)
Pt reports fall this morning while playing a game and fell face forward hitting head and chest . Reports taking blood thinners. Denies LOC.  Abrasions and blood noted to nose and lips. Dentures broken.  Edema noted to BLE.  Pt alert and oriented

## 2019-07-22 ENCOUNTER — Observation Stay
Admit: 2019-07-22 | Discharge: 2019-07-22 | Disposition: A | Discharge status: Home / Self Care | Payer: Medicare Other | Attending: Internal Medicine | Admitting: Internal Medicine

## 2019-07-22 DIAGNOSIS — N183 Chronic kidney disease, stage 3 unspecified: Secondary | ICD-10-CM | POA: Diagnosis present

## 2019-07-22 DIAGNOSIS — M7989 Other specified soft tissue disorders: Secondary | ICD-10-CM | POA: Diagnosis present

## 2019-07-22 DIAGNOSIS — E1122 Type 2 diabetes mellitus with diabetic chronic kidney disease: Secondary | ICD-10-CM | POA: Diagnosis present

## 2019-07-22 DIAGNOSIS — Z7902 Long term (current) use of antithrombotics/antiplatelets: Secondary | ICD-10-CM | POA: Diagnosis not present

## 2019-07-22 DIAGNOSIS — I25810 Atherosclerosis of coronary artery bypass graft(s) without angina pectoris: Secondary | ICD-10-CM

## 2019-07-22 DIAGNOSIS — W19XXXA Unspecified fall, initial encounter: Secondary | ICD-10-CM

## 2019-07-22 DIAGNOSIS — I872 Venous insufficiency (chronic) (peripheral): Secondary | ICD-10-CM | POA: Diagnosis present

## 2019-07-22 DIAGNOSIS — E785 Hyperlipidemia, unspecified: Secondary | ICD-10-CM | POA: Diagnosis present

## 2019-07-22 DIAGNOSIS — Y92009 Unspecified place in unspecified non-institutional (private) residence as the place of occurrence of the external cause: Secondary | ICD-10-CM | POA: Diagnosis not present

## 2019-07-22 DIAGNOSIS — Z85820 Personal history of malignant melanoma of skin: Secondary | ICD-10-CM | POA: Diagnosis not present

## 2019-07-22 DIAGNOSIS — Y9389 Activity, other specified: Secondary | ICD-10-CM | POA: Diagnosis not present

## 2019-07-22 DIAGNOSIS — M1990 Unspecified osteoarthritis, unspecified site: Secondary | ICD-10-CM | POA: Diagnosis present

## 2019-07-22 DIAGNOSIS — I2510 Atherosclerotic heart disease of native coronary artery without angina pectoris: Secondary | ICD-10-CM | POA: Diagnosis present

## 2019-07-22 DIAGNOSIS — F419 Anxiety disorder, unspecified: Secondary | ICD-10-CM | POA: Diagnosis present

## 2019-07-22 DIAGNOSIS — Z20822 Contact with and (suspected) exposure to covid-19: Secondary | ICD-10-CM | POA: Diagnosis present

## 2019-07-22 DIAGNOSIS — I272 Pulmonary hypertension, unspecified: Secondary | ICD-10-CM | POA: Diagnosis present

## 2019-07-22 DIAGNOSIS — R079 Chest pain, unspecified: Secondary | ICD-10-CM | POA: Diagnosis present

## 2019-07-22 DIAGNOSIS — W010XXA Fall on same level from slipping, tripping and stumbling without subsequent striking against object, initial encounter: Secondary | ICD-10-CM | POA: Diagnosis present

## 2019-07-22 DIAGNOSIS — I130 Hypertensive heart and chronic kidney disease with heart failure and stage 1 through stage 4 chronic kidney disease, or unspecified chronic kidney disease: Secondary | ICD-10-CM | POA: Diagnosis present

## 2019-07-22 DIAGNOSIS — I5020 Unspecified systolic (congestive) heart failure: Secondary | ICD-10-CM | POA: Diagnosis present

## 2019-07-22 DIAGNOSIS — Z951 Presence of aortocoronary bypass graft: Secondary | ICD-10-CM | POA: Diagnosis not present

## 2019-07-22 DIAGNOSIS — N400 Enlarged prostate without lower urinary tract symptoms: Secondary | ICD-10-CM | POA: Diagnosis present

## 2019-07-22 DIAGNOSIS — S298XXA Other specified injuries of thorax, initial encounter: Secondary | ICD-10-CM | POA: Diagnosis present

## 2019-07-22 DIAGNOSIS — I5022 Chronic systolic (congestive) heart failure: Secondary | ICD-10-CM | POA: Diagnosis present

## 2019-07-22 DIAGNOSIS — G4733 Obstructive sleep apnea (adult) (pediatric): Secondary | ICD-10-CM | POA: Diagnosis present

## 2019-07-22 DIAGNOSIS — E1151 Type 2 diabetes mellitus with diabetic peripheral angiopathy without gangrene: Secondary | ICD-10-CM | POA: Diagnosis present

## 2019-07-22 DIAGNOSIS — Z7982 Long term (current) use of aspirin: Secondary | ICD-10-CM | POA: Diagnosis not present

## 2019-07-22 DIAGNOSIS — E1140 Type 2 diabetes mellitus with diabetic neuropathy, unspecified: Secondary | ICD-10-CM | POA: Diagnosis present

## 2019-07-22 DIAGNOSIS — Z79899 Other long term (current) drug therapy: Secondary | ICD-10-CM | POA: Diagnosis not present

## 2019-07-22 DIAGNOSIS — J439 Emphysema, unspecified: Secondary | ICD-10-CM | POA: Diagnosis present

## 2019-07-22 DIAGNOSIS — N179 Acute kidney failure, unspecified: Secondary | ICD-10-CM | POA: Diagnosis present

## 2019-07-22 LAB — ECHOCARDIOGRAM COMPLETE
Height: 71 in
Weight: 2856 oz

## 2019-07-22 LAB — CBC
HCT: 36.2 % — ABNORMAL LOW (ref 39.0–52.0)
Hemoglobin: 12.1 g/dL — ABNORMAL LOW (ref 13.0–17.0)
MCH: 29.8 pg (ref 26.0–34.0)
MCHC: 33.4 g/dL (ref 30.0–36.0)
MCV: 89.2 fL (ref 80.0–100.0)
Platelets: 147 10*3/uL — ABNORMAL LOW (ref 150–400)
RBC: 4.06 MIL/uL — ABNORMAL LOW (ref 4.22–5.81)
RDW: 15.9 % — ABNORMAL HIGH (ref 11.5–15.5)
WBC: 7.3 10*3/uL (ref 4.0–10.5)
nRBC: 0 % (ref 0.0–0.2)

## 2019-07-22 LAB — BASIC METABOLIC PANEL
Anion gap: 8 (ref 5–15)
BUN: 29 mg/dL — ABNORMAL HIGH (ref 8–23)
CO2: 25 mmol/L (ref 22–32)
Calcium: 8.6 mg/dL — ABNORMAL LOW (ref 8.9–10.3)
Chloride: 104 mmol/L (ref 98–111)
Creatinine, Ser: 1.63 mg/dL — ABNORMAL HIGH (ref 0.61–1.24)
GFR calc Af Amer: 45 mL/min — ABNORMAL LOW (ref >60)
GFR calc non Af Amer: 39 mL/min — ABNORMAL LOW (ref >60)
Glucose, Bld: 137 mg/dL — ABNORMAL HIGH (ref 70–99)
Potassium: 3.7 mmol/L (ref 3.5–5.1)
Sodium: 137 mmol/L (ref 135–145)

## 2019-07-22 LAB — GLUCOSE, CAPILLARY
Glucose-Capillary: 119 mg/dL — ABNORMAL HIGH (ref 70–99)
Glucose-Capillary: 133 mg/dL — ABNORMAL HIGH (ref 70–99)
Glucose-Capillary: 122 mg/dL — ABNORMAL HIGH (ref 70–99)
Glucose-Capillary: 111 mg/dL — ABNORMAL HIGH (ref 70–99)

## 2019-07-22 MED ORDER — ENSURE ENLIVE PO LIQD
237.0000 mL | Freq: Two times a day (BID) | ORAL | Status: DC
  Administered 2019-07-22 – 2019-07-23 (×2): 237 mL via ORAL

## 2019-07-22 MED ORDER — FUROSEMIDE 10 MG/ML IJ SOLN
40.0000 mg | Freq: Once | INTRAMUSCULAR | Status: AC
  Administered 2019-07-22: 40 mg via INTRAVENOUS
  Filled 2019-07-22: qty 4

## 2019-07-22 NOTE — Progress Notes (Signed)
PROGRESS NOTE    SAMULE UGALDE  [id number] DOB: 1940-04-25 DOA: 07/21/2019 PCP: Olin Hauser, DO    Assessment & Plan:   Principal Problem:   Fall at home, initial encounter Active Problems:   Hypertension   Chronic venous insufficiency   CAD (coronary artery disease)   Type 2 diabetes mellitus with stage 3 chronic kidney disease (HCC)   BPH (benign prostatic hyperplasia)   Fall   Systolic heart failure ([place])    LANNEY CARRITHERS is a 79 y.o. Caucasian male with medical history significant for CAD s/p CABG, PVD, OSA, emphysema who presents to the ER for evaluation after he fell landing on his face and anterior chest wall. Patient was playing bocce ball and tripped and fell landing on his face and chest.  He has bilateral lower extremity swelling which is chronic and he states is slightly worse than his baseline and he is always short of breath with minimal exertion. Patient has an elevated troponin but 12 Lead EKG did not show any acute findings.   Fall, mechanical Patient presented to the ER after he sustained a mechanical fall and landed face forward. He had complaints of pain involving anterior chest wall with imaging study is negative for rib fractures or maxillofacial fractures PLAN: PT evaluation rec HHPT  # Dyspnea and LE swelling 2/2 chronic systolic CHF (unknown acuity) # Pulm HTN --Cr improved with IV diuresis, suggesting congestion  --TTE today showed LVEF 25-30%, and severely elevated pulm artery systolic pressure. --Cardiology consulted on admission --No BB on med list PLAN: --continue IV lasix 40 mg BID --Strict I/O --Hold home benazepril due to AKI  # Hx of CAD s/p CABG in 2006 # PVD --continue home ASA, plavix, statin  # Elevated Cr --Cr 1.87 on presentation.  Last known 1.07 in Aug 2020.  Improved to 1.63 with IV diuresis.    Chronic venous insufficiency Patient has bilateral lower extremity swelling which is chronic but worse when  compared to his baseline.  Can not tolerate compression stocking. PLAN: Elevate lower extremities  Diabetes mellitus type 2 with complications of chronic kidney disease stage III Maintain consistent carbohydrate diet Sliding scale coverage  BPH Continue finasteride and Flomax  Elevated troponin, not ACS Flat in 40's.  Maybe due to CHF.   DVT prophylaxis: SCD/Compression stockings Code Status: Full code  Family Communication:  Status is: changed to inpatient today from obs Dispo:   The patient is from: home Anticipated d/c is to: home Anticipated d/c date is: tomorrow Patient currently is not medically stable to d/c due to: CHF needing IV diuresis   Subjective and Interval History:  Pt complained of pain in his anterior rib cage with deep breathing.  No fever, dyspnea, abdominal pain, N/V/D, dysuria.  Leg swelling improved.  Reported difficulty eating solids now without his denture (which broke in half with the fall).   Objective: Vitals:   07/22/19 0214 07/22/19 0550 07/22/19 0741 07/22/19 1500  BP:  125/75 125/63 (!) 148/98  Pulse:  61 (!) 59 (!) 47  Resp: 14 (!) 25 (!) 24 18  Temp:  (!) 97.5 F (36.4 C) 98 F (36.7 C) 97.9 F (36.6 C)  TempSrc:  Oral Oral Oral  SpO2: 98% 100% 98% 93%  Weight:      Height:        Intake/Output Summary (Last 24 hours) at 07/22/2019 1654 Last data filed at 07/22/2019 1620 Gross per 24 hour  Intake --  Output 1950 ml  Net -1950 ml   Filed Weights   07/21/19 1142 07/21/19 1928  Weight: 74.8 kg 81 kg    Examination:   Constitutional: NAD, AAOx3 HEENT: conjunctivae and lids normal, EOMI, packing present in left nostril.   CV: RRR no M,R,G. Distal pulses +2.  No cyanosis.   RESP: CTA B/L, normal respiratory effort  GI: +BS, NTND Extremities: Swelling in BLE, with venous stasis skin changes, R>L SKIN: warm, dry and intact Neuro: II - XII grossly intact.  Sensation intact Psych: Normal mood and affect.  Appropriate  judgement and reason   Data Reviewed: I have personally reviewed following labs and imaging studies  CBC: Recent Labs  Lab 07/21/19 1315 07/22/19 0422  WBC 8.3 7.3  NEUTROABS 6.6  --   HGB 13.5 12.1*  HCT 41.1 36.2*  MCV 89.3 89.2  PLT 163 [id number]*   Basic Metabolic Panel: Recent Labs  Lab 07/21/19 1315 07/22/19 0422  NA 138 137  K 4.0 3.7  CL 103 104  CO2 27 25  GLUCOSE 165* 137*  BUN 31* 29*  CREATININE 1.87* 1.63*  CALCIUM 9.1 8.6*   GFR: Estimated Creatinine Clearance: 38.5 mL/min (A) (by C-G formula based on SCr of 1.63 mg/dL (H)). Liver Function Tests: Recent Labs  Lab 07/21/19 1315  AST 35  ALT 31  ALKPHOS 75  BILITOT 1.4*  PROT 6.2*  ALBUMIN 3.4*   No results for input(s): LIPASE, AMYLASE in the last 168 hours. No results for input(s): AMMONIA in the last 168 hours. Coagulation Profile: Recent Labs  Lab 07/21/19 1315  INR 1.5*   Cardiac Enzymes: Recent Labs  Lab 07/21/19 1905  CKTOTAL 268   BNP (last 3 results) No results for input(s): PROBNP in the last 8760 hours. HbA1C: No results for input(s): HGBA1C in the last 72 hours. CBG: Recent Labs  Lab 07/21/19 1632 07/21/19 2103 07/22/19 0742 07/22/19 1203 07/22/19 1613  GLUCAP 166* 174* 119* 133* 122*   Lipid Profile: No results for input(s): CHOL, HDL, LDLCALC, TRIG, CHOLHDL, LDLDIRECT in the last 72 hours. Thyroid Function Tests: No results for input(s): TSH, T4TOTAL, FREET4, T3FREE, THYROIDAB in the last 72 hours. Anemia Panel: No results for input(s): VITAMINB12, FOLATE, FERRITIN, TIBC, IRON, RETICCTPCT in the last 72 hours. Sepsis Labs: No results for input(s): PROCALCITON, LATICACIDVEN in the last 168 hours.  Recent Results (from the past 240 hour(s))  Respiratory Panel by RT PCR (Flu A&B, Covid) - Nasopharyngeal Swab     Status: None   Collection Time: 07/21/19  3:26 PM   Specimen: Nasopharyngeal Swab  Result Value Ref Range Status   SARS Coronavirus 2 by RT PCR NEGATIVE  NEGATIVE Final    Comment: (NOTE) SARS-CoV-2 target nucleic acids are NOT DETECTED. The SARS-CoV-2 RNA is generally detectable in upper respiratoy specimens during the acute phase of infection. The lowest concentration of SARS-CoV-2 viral copies this assay can detect is 131 copies/mL. A negative result does not preclude SARS-Cov-2 infection and should not be used as the sole basis for treatment or other patient management decisions. A negative result may occur with  improper specimen collection/handling, submission of specimen other than nasopharyngeal swab, presence of viral mutation(s) within the areas targeted by this assay, and inadequate number of viral copies (<131 copies/mL). A negative result must be combined with clinical observations, patient history, and epidemiological information. The expected result is Negative. Fact Sheet for Patients:  PinkCheek.be Fact Sheet for Healthcare Providers:  GravelBags.it This test is not yet ap proved or cleared  by the [place] and  has been authorized for detection and/or diagnosis of SARS-CoV-2 by FDA under an Emergency Use Authorization (EUA). This EUA will remain  in effect (meaning this test can be used) for the duration of the COVID-19 declaration under Section 564(b)(1) of the Act, 21 U.S.C. section 360bbb-3(b)(1), unless the authorization is terminated or revoked sooner.    Influenza A by PCR NEGATIVE NEGATIVE Final   Influenza B by PCR NEGATIVE NEGATIVE Final    Comment: (NOTE) The Xpert Xpress SARS-CoV-2/FLU/RSV assay is intended as an aid in  the diagnosis of influenza from Nasopharyngeal swab specimens and  should not be used as a sole basis for treatment. Nasal washings and  aspirates are unacceptable for Xpert Xpress SARS-CoV-2/FLU/RSV  testing. Fact Sheet for Patients: PinkCheek.be Fact Sheet for Healthcare  Providers: GravelBags.it This test is not yet approved or cleared by the [place] FDA and  has been authorized for detection and/or diagnosis of SARS-CoV-2 by  FDA under an Emergency Use Authorization (EUA). This EUA will remain  in effect (meaning this test can be used) for the duration of the  Covid-19 declaration under Section 564(b)(1) of the Act, 21  U.S.C. section 360bbb-3(b)(1), unless the authorization is  terminated or revoked. Performed at [facility], [address] [place], [place] [address]       Radiology Studies: CT Head Wo Contrast  Result Date: 07/21/2019 CLINICAL DATA:  Fall, facial trauma EXAM: CT HEAD WITHOUT CONTRAST CT MAXILLOFACIAL WITHOUT CONTRAST CT CERVICAL SPINE WITHOUT CONTRAST TECHNIQUE: Multidetector CT imaging of the head, cervical spine, and maxillofacial structures were performed using the standard protocol without intravenous contrast. Multiplanar CT image reconstructions of the cervical spine and maxillofacial structures were also generated. COMPARISON:  None. FINDINGS: CT HEAD FINDINGS Brain: No evidence of acute infarction, hemorrhage, hydrocephalus, extra-axial collection or mass lesion/mass effect. Mild age related cerebral volume loss. Vascular: Atherosclerotic calcifications involving the large vessels of the skull base. No unexpected hyperdense vessel. Skull: Normal. Negative for fracture or focal lesion. Other: None. CT MAXILLOFACIAL FINDINGS Osseous: No fracture or mandibular dislocation. Bony orbital walls are intact. Maxillary sinus walls intact. No destructive process. Orbits: Negative. No traumatic or inflammatory finding. Sinuses: Paranasal sinuses and mastoid air cells are clear without mucosal thickening, opacification, or air-fluid level. Soft tissues: No focal soft tissue hematoma. CT CERVICAL SPINE FINDINGS Alignment: Physiologic alignment without static listhesis. Facet joints are aligned without  dislocation. Skull base and vertebrae: No acute fracture. No primary bone lesion or focal pathologic process. The right C3-4 facet joint is fused. Soft tissues and spinal canal: No prevertebral fluid or swelling. No visible canal hematoma. Disc levels: Multilevel intervertebral disc height loss with degenerative endplate spurring. Mild multilevel facet arthropathy. No evidence of high-grade foraminal or canal stenosis by CT. Upper chest: Visualized lung apices clear. Other: Vertebral and carotid artery atherosclerosis. IMPRESSION: 1. No acute intracranial findings. 2. No evidence of acute maxillofacial bone fracture. 3. No acute fracture or static listhesis of the cervical spine. 4. 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 Electronically Signed   By: Davina Poke D.O.   On: 07/21/2019 13:18   CT Chest Wo Contrast  Result Date: 07/21/2019 CLINICAL DATA:  Fall. EXAM: CT CHEST WITHOUT CONTRAST TECHNIQUE: Multidetector CT imaging of the chest was performed following the standard protocol without IV contrast. COMPARISON:  None FINDINGS: Cardiovascular: There is mild cardiac enlargement. Aortic atherosclerosis. Previous median sternotomy the and CABG procedure. Extensive aortic atherosclerosis.  Mediastinum/Nodes: No enlarged mediastinal or axillary lymph nodes. Thyroid gland, trachea, and esophagus demonstrate no significant findings. Lungs/Pleura: Mild changes of emphysema. No airspace consolidation, atelectasis or pneumothorax. Upper Abdomen: No acute abnormality. Aortic atherosclerosis. Musculoskeletal: Previous median sternotomy. Marked thoracic kyphosis. Multi level disc space narrowing and endplate spurring identified. No acute findings IMPRESSION: 1. No acute cardiopulmonary abnormalities. 2. Emphysema and aortic atherosclerosis. 3. Marked thoracic kyphosis. Aortic Atherosclerosis (ICD10-I70.0) and Emphysema (ICD10-J43.9). Electronically Signed   By: Kerby Moors  M.D.   On: 07/21/2019 15:02   CT Cervical Spine Wo Contrast  Result Date: 07/21/2019 CLINICAL DATA:  Fall, facial trauma EXAM: CT HEAD WITHOUT CONTRAST CT MAXILLOFACIAL WITHOUT CONTRAST CT CERVICAL SPINE WITHOUT CONTRAST TECHNIQUE: Multidetector CT imaging of the head, cervical spine, and maxillofacial structures were performed using the standard protocol without intravenous contrast. Multiplanar CT image reconstructions of the cervical spine and maxillofacial structures were also generated. COMPARISON:  None. FINDINGS: CT HEAD FINDINGS Brain: No evidence of acute infarction, hemorrhage, hydrocephalus, extra-axial collection or mass lesion/mass effect. Mild age related cerebral volume loss. Vascular: Atherosclerotic calcifications involving the large vessels of the skull base. No unexpected hyperdense vessel. Skull: Normal. Negative for fracture or focal lesion. Other: None. CT MAXILLOFACIAL FINDINGS Osseous: No fracture or mandibular dislocation. Bony orbital walls are intact. Maxillary sinus walls intact. No destructive process. Orbits: Negative. No traumatic or inflammatory finding. Sinuses: Paranasal sinuses and mastoid air cells are clear without mucosal thickening, opacification, or air-fluid level. Soft tissues: No focal soft tissue hematoma. CT CERVICAL SPINE FINDINGS Alignment: Physiologic alignment without static listhesis. Facet joints are aligned without dislocation. Skull base and vertebrae: No acute fracture. No primary bone lesion or focal pathologic process. The right C3-4 facet joint is fused. Soft tissues and spinal canal: No prevertebral fluid or swelling. No visible canal hematoma. Disc levels: Multilevel intervertebral disc height loss with degenerative endplate spurring. Mild multilevel facet arthropathy. No evidence of high-grade foraminal or canal stenosis by CT. Upper chest: Visualized lung apices clear. Other: Vertebral and carotid artery atherosclerosis. IMPRESSION: 1. No acute  intracranial findings. 2. No evidence of acute maxillofacial bone fracture. 3. No acute fracture or static listhesis of the cervical spine. 4. 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 Electronically Signed   By: Davina Poke D.O.   On: 07/21/2019 13:18   DG Chest Portable 1 View  Result Date: 07/21/2019 CLINICAL DATA:  Pain. Fall. Patient tripped and fell forward on his face. Legs feel heavy. Pitting edema. EXAM: PORTABLE CHEST 1 VIEW COMPARISON:  11/05/2015 FINDINGS: Previous median sternotomy and CABG. The heart is enlarged, increased in size compared with the previous study. There is significant atherosclerotic calcification of the thoracic aorta. The lungs are clear. No focal consolidation or pulmonary edema. No acute displaced fracture. No pneumothorax. IMPRESSION: 1. Cardiomegaly. 2. No evidence for acute abnormality. Electronically Signed   By: Nolon Nations M.D.   On: 07/21/2019 13:14   ECHOCARDIOGRAM COMPLETE  Result Date: 07/22/2019    ECHOCARDIOGRAM REPORT   Patient Name:   PATTI BRANNIGAN Date of Exam: 07/22/2019 Medical Rec #:  [id number]      Height:       71.0 in Accession #:    [id number]     Weight:       178.5 lb Date of Birth:  12/01/1940      BSA:          2.009 m Patient Age:    64 years  BP:           125/63 mmHg Patient Gender: M              HR:           59 bpm. Exam Location:  [facility] Procedure: 2D Echo, Cardiac Doppler and Color Doppler Indications:     Cardiomegaly 429.3  History:         Patient has no prior history of Echocardiogram examinations.                  Risk Factors:Hypertension. PVD.  Sonographer:     Sherrie Sport RDCS (AE) Referring Phys:  [id number] Royce Macadamia AGBATA Diagnosing Phys: Bartholome Bill MD IMPRESSIONS  1. Left ventricular ejection fraction, by estimation, is 25 to 30%. The left ventricle has severely decreased function. The left ventricle has no regional wall motion abnormalities. The left ventricular internal cavity  size was mildly dilated. Left ventricular diastolic parameters were normal.  2. Right ventricular systolic function is normal. The right ventricular size is mildly enlarged. There is severely elevated pulmonary artery systolic pressure.  3. Left atrial size was mildly dilated.  4. Right atrial size was mildly dilated.  5. The mitral valve is grossly normal. Mild to moderate mitral valve regurgitation.  6. Tricuspid valve regurgitation is moderate.  7. The aortic valve is tricuspid. Aortic valve regurgitation is trivial. Mild to moderate aortic valve sclerosis/calcification is present, without any evidence of aortic stenosis. FINDINGS  Left Ventricle: Left ventricular ejection fraction, by estimation, is 25 to 30%. The left ventricle has severely decreased function. The left ventricle has no regional wall motion abnormalities. The left ventricular internal cavity size was mildly dilated. There is no left ventricular hypertrophy. Left ventricular diastolic parameters were normal. Right Ventricle: The right ventricular size is mildly enlarged. No increase in right ventricular wall thickness. Right ventricular systolic function is normal. There is severely elevated pulmonary artery systolic pressure. The tricuspid regurgitant velocity is 3.66 m/s, and with an assumed right atrial pressure of 10 mmHg, the estimated right ventricular systolic pressure is [id number] mmHg. Left Atrium: Left atrial size was mildly dilated. Right Atrium: Right atrial size was mildly dilated. Pericardium: There is no evidence of pericardial effusion. Mitral Valve: The mitral valve is grossly normal. Mild to moderate mitral valve regurgitation. Tricuspid Valve: The tricuspid valve is grossly normal. Tricuspid valve regurgitation is moderate. Aortic Valve: The aortic valve is tricuspid. Aortic valve regurgitation is trivial. Mild to moderate aortic valve sclerosis/calcification is present, without any evidence of aortic stenosis. Aortic valve mean  gradient measures 2.7 mmHg. Aortic valve peak  gradient measures 5.0 mmHg. Aortic valve area, by VTI measures 1.36 cm. Pulmonic Valve: The pulmonic valve was normal in structure. Pulmonic valve regurgitation is trivial. Aorta: The aortic root is normal in size and structure. IAS/Shunts: The interatrial septum was not assessed.  LEFT VENTRICLE PLAX 2D LVIDd:         4.99 cm LVIDs:         4.86 cm LV PW:         1.11 cm LV IVS:        0.92 cm LVOT diam:     2.00 cm LV SV:         28 LV SV Index:   14 LVOT Area:     3.14 cm  LV Volumes (MOD) LV vol d, MOD A2C: 146.0 ml LV vol d, MOD A4C: 149.0 ml LV vol s, MOD A2C: 109.0 ml LV vol  s, MOD A4C: 110.0 ml LV SV MOD A2C:     37.0 ml LV SV MOD A4C:     149.0 ml LV SV MOD BP:      39.5 ml RIGHT VENTRICLE RV Basal diam:  5.23 cm RV S prime:     4.46 cm/s TAPSE (M-mode): 3.0 cm LEFT ATRIUM             Index       RIGHT ATRIUM           Index LA diam:        6.00 cm 2.99 cm/m  RA Area:     24.20 cm LA Vol (A2C):   83.9 ml 41.76 ml/m RA Volume:   84.30 ml  41.96 ml/m LA Vol (A4C):   82.3 ml 40.96 ml/m LA Biplane Vol: 83.1 ml 41.36 ml/m  AORTIC VALVE                   PULMONIC VALVE AV Area (Vmax):    1.30 cm    PV Vmax:        0.53 m/s AV Area (Vmean):   1.46 cm    PV Peak grad:   1.1 mmHg AV Area (VTI):     1.36 cm    RVOT Peak grad: 2 mmHg AV Vmax:           111.90 cm/s AV Vmean:          70.633 cm/s AV VTI:            0.206 m AV Peak Grad:      5.0 mmHg AV Mean Grad:      2.7 mmHg LVOT Vmax:         46.30 cm/s LVOT Vmean:        32.900 cm/s LVOT VTI:          0.089 m LVOT/AV VTI ratio: 0.43  AORTA Ao Root diam: 3.20 cm MITRAL VALVE               TRICUSPID VALVE MV Area (PHT): 4.12 cm    TR Peak grad:   53.6 mmHg MV Decel Time: 184 msec    TR Vmax:        366.00 cm/s MV E velocity: 78.80 cm/s                            SHUNTS                            Systemic VTI:  0.09 m                            Systemic Diam: 2.00 cm Bartholome Bill MD Electronically signed by  Bartholome Bill MD Signature Date/Time: 07/22/2019/12:49:07 PM    Final    CT Maxillofacial Wo Contrast  Result Date: 07/21/2019 CLINICAL DATA:  Fall, facial trauma EXAM: CT HEAD WITHOUT CONTRAST CT MAXILLOFACIAL WITHOUT CONTRAST CT CERVICAL SPINE WITHOUT CONTRAST TECHNIQUE: Multidetector CT imaging of the head, cervical spine, and maxillofacial structures were performed using the standard protocol without intravenous contrast. Multiplanar CT image reconstructions of the cervical spine and maxillofacial structures were also generated. COMPARISON:  None. FINDINGS: CT HEAD FINDINGS Brain: No evidence of acute infarction, hemorrhage, hydrocephalus, extra-axial collection or mass lesion/mass effect. Mild age related cerebral volume loss. Vascular: Atherosclerotic calcifications involving the large  vessels of the skull base. No unexpected hyperdense vessel. Skull: Normal. Negative for fracture or focal lesion. Other: None. CT MAXILLOFACIAL FINDINGS Osseous: No fracture or mandibular dislocation. Bony orbital walls are intact. Maxillary sinus walls intact. No destructive process. Orbits: Negative. No traumatic or inflammatory finding. Sinuses: Paranasal sinuses and mastoid air cells are clear without mucosal thickening, opacification, or air-fluid level. Soft tissues: No focal soft tissue hematoma. CT CERVICAL SPINE FINDINGS Alignment: Physiologic alignment without static listhesis. Facet joints are aligned without dislocation. Skull base and vertebrae: No acute fracture. No primary bone lesion or focal pathologic process. The right C3-4 facet joint is fused. Soft tissues and spinal canal: No prevertebral fluid or swelling. No visible canal hematoma. Disc levels: Multilevel intervertebral disc height loss with degenerative endplate spurring. Mild multilevel facet arthropathy. No evidence of high-grade foraminal or canal stenosis by CT. Upper chest: Visualized lung apices clear. Other: Vertebral and carotid artery  atherosclerosis. IMPRESSION: 1. No acute intracranial findings. 2. No evidence of acute maxillofacial bone fracture. 3. No acute fracture or static listhesis of the cervical spine. 4. 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 Electronically Signed   By: Davina Poke D.O.   On: 07/21/2019 13:18     Scheduled Meds: . aspirin EC  81 mg Oral Daily  . atorvastatin  10 mg Oral Daily  . clopidogrel  75 mg Oral Daily  . feeding supplement (ENSURE ENLIVE)  237 mL Oral BID BM  . finasteride  5 mg Oral Daily  . gabapentin  200 mg Oral q morning - 10a   And  . gabapentin  100 mg Oral QHS  . insulin aspart  0-15 Units Subcutaneous TID WC  . insulin aspart  4 Units Subcutaneous TID WC  . tamsulosin  0.4 mg Oral Daily   Continuous Infusions:   LOS: 0 days     Enzo Bi, MD [facility] If 7PM-7AM, please contact night-coverage 07/22/2019, 4:54 PM

## 2019-07-22 NOTE — Evaluation (Signed)
Physical Therapy Evaluation Patient Details Name: Randy Howard MRN: [id number] DOB: November 03, 1940 Today's Date: 07/22/2019   History of Present Illness  Pt is an 79 y.o. male with medical history significant for CAD s/p CABG, PVD, OSA, emphysema who presents to the ER for evaluation after he fell [facility] and [facility]. Imaging negative for fx.    Clinical Impression  Patient alert, agreeable to PT, does endorse chest/rib pain intermittently during session due to fall. The patient stated he lives alone, his family lives out of state, but occasionally a friend will check on him, provide rides. Pt modI for ADLs, utilizing [facility] for community ambulation, stated he furniture walks in his home. Reported "a couple" falls in the last 6 months.   The patient demonstrated bed mobility mod I, use of bed rails and extended time. Sit <> Stand from EOB and from standard commode mod I with SPC (use of grab bar for low toilet). Pt spent majority of session on his feet. First attempted to use urinal without UE support, and then ambulated to bathroom. Extensive time spent in bathroom, pt determined to make sure he was clean after BM, many minutes spent standing and wiping, At least 8 minutes in standing without UE support, reaching posteriorly. Unclear readings on spO2, pt cued for breathing and rest as able. No physical assist needed. Pt ambulated ~67ft total with SPC in room, does reach for external support bilaterally.  Pt educated on potential use of RW upon returning home to improve safety. Overall the patient demonstrated deficits (see "PT Problem List") that impede the patient's functional abilities, safety, and mobility and would benefit from skilled PT intervention. Recommendation is HHPT.      Follow Up Recommendations Home health PT    Equipment Recommendations  Other (comment);None recommended by PT(Pt has RW and SPC at home)    Recommendations for Other  Services       Precautions / Restrictions Precautions Precautions: Fall Restrictions Weight Bearing Restrictions: No      Mobility  Bed Mobility Overal bed mobility: Modified Independent                Transfers Overall transfer level: Modified independent Equipment used: None;Straight cane             General transfer comment: pt transferred from bed and from standard commode, use of grab bar. Pt reported his toilet is not that low  Ambulation/Gait Ambulation/Gait assistance: Min guard Gait Distance (Feet): 25 Feet Assistive device: Straight cane;None   Gait velocity: decreased   General Gait Details: Pt able to perform ambulation with and without SPC, does reach for external support with both UEs. Educated that he may benefit from using his RW upon returning home initially to ensure safety.  Stairs            Wheelchair Mobility    Modified Rankin (Stroke Patients Only)       Balance Overall balance assessment: Needs assistance Sitting-balance support: Feet supported Sitting balance-Leahy Scale: Good       Standing balance-Leahy Scale: Good                               Pertinent Vitals/Pain Pain Assessment: Faces Faces Pain Scale: Hurts even more Pain Location: with twisting/turning pt reported chest/rib pain Pain Descriptors / Indicators: Aching;Constant;Grimacing Pain Intervention(s): Limited activity within patient's tolerance;Monitored during session;Repositioned    Home  Living Family/patient expects to be discharged to:: Private residence Living Arrangements: Alone Available Help at Discharge: Other (Comment)(no assistance available, has an occasional friend check in, children live out of state) Type of Home: House Home Access: Stairs to enter Entrance Stairs-Rails: Right Entrance Stairs-Number of Steps: 4 Home Layout: One level Home Equipment: Cane - single point;Walker - 2 wheels Additional Comments: Randy Howard appears  homemade walking-stick type.    Prior Function Level of Independence: Independent with assistive device(s)         Comments: Pt Mod Ind with amb with a SPC (walking stick) limited community distances (does not use in house), Ind with ADLs, reported "a couple" falls in the last 6 months. Pt does not drive.     Hand Dominance        Extremity/Trunk Assessment   Upper Extremity Assessment Upper Extremity Assessment: Generalized weakness    Lower Extremity Assessment Lower Extremity Assessment: Generalized weakness    Cervical / Trunk Assessment Cervical / Trunk Assessment: Kyphotic  Communication   Communication: No difficulties  Cognition Arousal/Alertness: Awake/alert Behavior During Therapy: WFL for tasks assessed/performed Overall Cognitive Status: Within Functional Limits for tasks assessed                                        General Comments      Exercises Other Exercises Other Exercises: Extensive time spent in bathroom. Pt determined to make sure he was clean after BM. At least 8 minutes in standing without UE support, reaching posteriorly. Unclear readings on spO2, pt cued for breathing and rest as able. No physical assist needed.   Assessment/Plan    PT Assessment Patient needs continued PT services  PT Problem List Decreased safety awareness;Decreased activity tolerance;Decreased balance;Decreased knowledge of use of DME       PT Treatment Interventions DME instruction;Therapeutic exercise;Gait training;Balance training;Stair training;Neuromuscular re-education;Functional mobility training;Therapeutic activities;Patient/family education    PT Goals (Current goals can be found in the Care Plan section)  Acute Rehab PT Goals Patient Stated Goal: to decrease chest/rib pain PT Goal Formulation: With patient Time For Goal Achievement: 08/05/19 Potential to Achieve Goals: Good    Frequency Min 2X/week   Barriers to discharge         Co-evaluation               AM-PAC PT "6 Clicks" Mobility  Outcome Measure Help needed turning from your back to your side while in a flat bed without using bedrails?: None Help needed moving from lying on your back to sitting on the side of a flat bed without using bedrails?: None Help needed moving to and from a bed to a chair (including a wheelchair)?: None Help needed standing up from a chair using your arms (e.g., wheelchair or bedside chair)?: None Help needed to walk in hospital room?: A Little Help needed climbing 3-5 steps with a railing? : A Little 6 Click Score: 22    End of Session Equipment Utilized During Treatment: Gait belt Activity Tolerance: Patient tolerated treatment well Patient left: in chair;with chair alarm set;with call bell/phone within reach Nurse Communication: Mobility status PT Visit Diagnosis: Other abnormalities of gait and mobility (R26.89);Muscle weakness (generalized) (M62.81);Difficulty in walking, not elsewhere classified (R26.2);History of falling (Z91.81)    Time: [id number] PT Time Calculation (min) (ACUTE ONLY): 42 min   Charges:   PT Evaluation $PT Eval Low Complexity: 1 Low PT  Treatments $Therapeutic Activity: 23-37 mins        Lieutenant Diego PT, DPT 10:06 AM,07/22/19

## 2019-07-22 NOTE — Evaluation (Addendum)
Clinical/Bedside Swallow Evaluation Patient Details  Name: Randy Howard MRN: [id number] Date of Birth: 08-16-1940  Today's Date: 07/22/2019 Time: SLP Start Time (ACUTE ONLY): 1100 SLP Stop Time (ACUTE ONLY): 1140 SLP Time Calculation (min) (ACUTE ONLY): 40 min  Past Medical History:  Past Medical History:  Diagnosis Date  . Arthritis   . Coronary artery disease   . Dyspnea   . Elevated lipids   . Hyperlipidemia   . Hypertension   . Neuropathy   . Nocturia   . Obstructive sleep apnea   . Peripheral vascular disease ([place])   . Prostate enlargement   . PVD (peripheral vascular disease) ([place])   . Right carotid bruit   . Skin cancer    Followed by Dr. Evorn Gong  . Skin cancer   . Tremor    Past Surgical History:  Past Surgical History:  Procedure Laterality Date  . CARDIAC CATHETERIZATION    . CAROTID ARTERY ANGIOPLASTY Left 09/30/2011  . CORONARY ARTERY BYPASS GRAFT  2006  . ENDARTERECTOMY FEMORAL Bilateral 11/01/2014   Procedure: ENDARTERECTOMY FEMORAL;  Surgeon: Algernon Huxley, MD;  Location: [facility] ORS;  Service: Vascular;  Laterality: Bilateral;  . LOWER EXTREMITY ANGIOGRAPHY Left 04/26/2018   Procedure: LOWER EXTREMITY ANGIOGRAPHY;  Surgeon: Algernon Huxley, MD;  Location: [place] [facility];  Service: Cardiovascular;  Laterality: Left;  . LOWER EXTREMITY ANGIOGRAPHY Right 09/23/2018   Procedure: LOWER EXTREMITY ANGIOGRAPHY;  Surgeon: Algernon Huxley, MD;  Location: [place] [facility];  Service: Cardiovascular;  Laterality: Right;  . LOWER EXTREMITY ANGIOGRAPHY Right 11/12/2018   Procedure: LOWER EXTREMITY ANGIOGRAPHY;  Surgeon: Algernon Huxley, MD;  Location: [place] [facility];  Service: Cardiovascular;  Laterality: Right;  . PERIPHERAL VASCULAR CATHETERIZATION Left 10/09/2014   Procedure: Lower Extremity Angiography;  Surgeon: Algernon Huxley, MD;  Location: [place] [facility];  Service: Cardiovascular;  Laterality: Left;  . PERIPHERAL VASCULAR CATHETERIZATION  10/09/2014   Procedure:  Lower Extremity Intervention;  Surgeon: Algernon Huxley, MD;  Location: [place] [facility];  Service: Cardiovascular;;   HPI:  Pt is an 79 y.o. male with medical history significant for CAD s/p CABG, PVD, OSA, emphysema who presents to the ER for evaluation after he fell landing on his face and anterior chest wall at the senior center. Imaging negative for fx.    Assessment / Plan / Recommendation Clinical Impression  Pt appears to present w/ adequate oropharyngeal phase swallow function w/ No oropharyngeal phase dysphagia noted, No neuromuscular deficits noted.Pt consumed po trials w/ No overt, clinical s/s of aspiration during po trials. Pt appears at reduced risk for aspiration following general aspiration precautions. During po trials, pt consumed all consistencies w/ no overt coughing, decline in vocal quality, or change in respiratory presentation during/post trials. Oral phase appeared [facility] w/ timely bolus management and control of bolus propulsion for A-P transfer for swallowing. Oral clearing achieved w/ trial consistencies given. However, pt is now Edentulous unable to wear his Upper Denture plate d/t recent Fall on his face in which the Upper Denture plate brok and caused an abrasion on his upper lip; pt c/o a sore mouth currently. This restricts pt's ability to masticate solid foods for eating/swallowing. Pt reported to the Kitchen he had difficulty chewing the breakfast foods this morning but no issues w/ the puree foods such as applesauce, grits. OM Exam appeared [facility] w/ no unilateral weakness noted. Speech Clear. Pt fed self w/ setup support. Recommend a Puree consistency diet, moistened foods  for the ease of the oral phase management/intake; Thin liquids. Pt can attempt foods of increased consistency when his mouth/lip are healed s/p Fall; may want new upper Denture plate for better mastication(as he had a baseline). Recommend general aspiration precautions, Pills WHOLE in Puree if safer, easier  swallowing. Education given on food consistencies and easy to eat options; general aspiration precautions. NSG to reconsult if any new needs arise. MD updated.  SLP Visit Diagnosis: Dysphagia, unspecified (R13.10)(sore mouth s/p fall; no Dentures)    Aspiration Risk  Risk for inadequate nutrition/hydration    Diet Recommendation  Pureed diet w/ gravies; Thin liquids. General aspiration precautions.   Medication Administration: Whole meds with liquid(or whole in puree if needed)    Other  Recommendations Recommended Consults: (Dietician f/u) Oral Care Recommendations: Oral care BID;Oral care before and after PO;Patient independent with oral care Other Recommendations: (n/a)   Follow up Recommendations None      Frequency and Duration (n/a)  (n/a)       Prognosis Barriers to Reach Goals: (lacking Dentures now)      Swallow Study   General Date of Onset: 07/21/19 HPI: Pt is an 79 y.o. male with medical history significant for CAD s/p CABG, PVD, OSA, emphysema who presents to the ER for evaluation after he fell landing on his face and anterior chest wall at the senior center. Imaging negative for fx.  Type of Study: Bedside Swallow Evaluation Previous Swallow Assessment: none Diet Prior to this Study: Regular;Thin liquids Temperature Spikes Noted: No(wbc 7.3) Respiratory Status: Room air History of Recent Intubation: No Behavior/Cognition: Alert;Cooperative;Pleasant mood;Distractible Oral Cavity Assessment: Edema(and redness w/ abrasion to upper lip s/p fall) Oral Care Completed by SLP: Yes Oral Cavity - Dentition: Edentulous Vision: Functional for self-feeding Self-Feeding Abilities: Able to feed self;Needs set up(min) Patient Positioning: Upright in chair Baseline Vocal Quality: Normal Volitional Cough: Strong Volitional Swallow: Able to elicit    Oral/Motor/Sensory Function Overall Oral Motor/Sensory Function: Within functional limits   Ice Chips Ice chips: Not tested    Thin Liquid Thin Liquid: Within functional limits Presentation: Self Fed;Straw(~3 ozs)    Nectar Thick Nectar Thick Liquid: Not tested   Honey Thick Honey Thick Liquid: Not tested   Puree Puree: Within functional limits Presentation: Self Fed;Spoon(8 trials)   Solid     Solid: Not tested Other Comments: d/t abrasion and sore mouth s/p fall       Orinda Kenner, MS, CCC-SLP Samary Shatz 07/22/2019,2:39 PM

## 2019-07-22 NOTE — Progress Notes (Signed)
*  PRELIMINARY RESULTS* Echocardiogram 2D Echocardiogram has been performed.  Sherrie Sport 07/22/2019, 10:20 AM

## 2019-07-22 NOTE — Progress Notes (Signed)
*  PRELIMINARY RESULTS* Echocardiogram 2D Echocardiogram has been performed.  Sherrie Sport 07/22/2019, 10:19 AM

## 2019-07-23 DIAGNOSIS — W19XXXA Unspecified fall, initial encounter: Secondary | ICD-10-CM

## 2019-07-23 DIAGNOSIS — Y92009 Unspecified place in unspecified non-institutional (private) residence as the place of occurrence of the external cause: Secondary | ICD-10-CM

## 2019-07-23 DIAGNOSIS — I25810 Atherosclerosis of coronary artery bypass graft(s) without angina pectoris: Secondary | ICD-10-CM

## 2019-07-23 LAB — BASIC METABOLIC PANEL
Anion gap: 8 (ref 5–15)
BUN: 26 mg/dL — ABNORMAL HIGH (ref 8–23)
CO2: 28 mmol/L (ref 22–32)
Calcium: 8.5 mg/dL — ABNORMAL LOW (ref 8.9–10.3)
Chloride: 104 mmol/L (ref 98–111)
Creatinine, Ser: 1.4 mg/dL — ABNORMAL HIGH (ref 0.61–1.24)
GFR calc Af Amer: 55 mL/min — ABNORMAL LOW (ref >60)
GFR calc non Af Amer: 47 mL/min — ABNORMAL LOW (ref >60)
Glucose, Bld: 122 mg/dL — ABNORMAL HIGH (ref 70–99)
Potassium: 3.4 mmol/L — ABNORMAL LOW (ref 3.5–5.1)
Sodium: 140 mmol/L (ref 135–145)

## 2019-07-23 LAB — CBC
HCT: 38.1 % — ABNORMAL LOW (ref 39.0–52.0)
Hemoglobin: 12.6 g/dL — ABNORMAL LOW (ref 13.0–17.0)
MCH: 29.6 pg (ref 26.0–34.0)
MCHC: 33.1 g/dL (ref 30.0–36.0)
MCV: 89.4 fL (ref 80.0–100.0)
Platelets: 146 10*3/uL — ABNORMAL LOW (ref 150–400)
RBC: 4.26 MIL/uL (ref 4.22–5.81)
RDW: 15.9 % — ABNORMAL HIGH (ref 11.5–15.5)
WBC: 7.7 10*3/uL (ref 4.0–10.5)
nRBC: 0 % (ref 0.0–0.2)

## 2019-07-23 LAB — GLUCOSE, CAPILLARY: Glucose-Capillary: 194 mg/dL — ABNORMAL HIGH (ref 70–99)

## 2019-07-23 LAB — MAGNESIUM: Magnesium: 2 mg/dL (ref 1.7–2.4)

## 2019-07-23 MED ORDER — FUROSEMIDE 10 MG/ML IJ SOLN
40.0000 mg | Freq: Once | INTRAMUSCULAR | Status: AC
  Administered 2019-07-23: 09:00:00 40 mg via INTRAVENOUS
  Filled 2019-07-23: qty 4

## 2019-07-23 MED ORDER — GABAPENTIN 100 MG PO CAPS: 100.0000 mg | ORAL_CAPSULE | ORAL | Status: AC

## 2019-07-23 MED ORDER — BENAZEPRIL HCL 20 MG PO TABS: 20.0000 mg | ORAL_TABLET | Freq: Every day | ORAL | 2 refills | Status: DC

## 2019-07-23 MED ORDER — ENSURE ENLIVE PO LIQD: 237.0000 mL | Freq: Two times a day (BID) | ORAL | 12 refills | Status: AC

## 2019-07-23 MED ORDER — BACLOFEN 10 MG PO TABS: 10.0000 mg | ORAL_TABLET | Freq: Three times a day (TID) | ORAL | Status: DC | PRN

## 2019-07-23 MED ORDER — HYDROXYZINE HCL 10 MG PO TABS: 10.0000 mg | ORAL_TABLET | Freq: Three times a day (TID) | ORAL | Status: AC | PRN

## 2019-07-23 MED ORDER — POTASSIUM CHLORIDE CRYS ER 20 MEQ PO TBCR
40.0000 meq | EXTENDED_RELEASE_TABLET | Freq: Once | ORAL | Status: AC
  Administered 2019-07-23: 09:00:00 40 meq via ORAL
  Filled 2019-07-23: qty 2

## 2019-07-23 MED ORDER — TAMSULOSIN HCL 0.4 MG PO CAPS: 0.4000 mg | ORAL_CAPSULE | Freq: Every day | ORAL | Status: AC

## 2019-07-23 MED ORDER — BUSPIRONE HCL 5 MG PO TABS: 5.0000 mg | ORAL_TABLET | Freq: Every evening | ORAL | Status: DC | PRN

## 2019-07-23 MED ORDER — TORSEMIDE 20 MG PO TABS: 20.0000 mg | ORAL_TABLET | Freq: Every day | ORAL | 0 refills | Status: AC

## 2019-07-23 NOTE — Discharge Summary (Signed)
Physician Discharge Summary   Randy Howard  male DOB: March 04, 1941  [id number]  PCP: Olin Hauser, DO  Admit date: 07/21/2019 Discharge date: 07/23/2019  Admitted From: home Disposition:  home Home Health: Yes CODE STATUS: Full code  Discharge Instructions    Diet - low sodium heart healthy   Complete by: As directed    Discharge instructions   Complete by: As directed    I have reduced your benazepril to 20 mg daily (down from your previous 40 mg daily) so your blood pressure will not drop while taking your water pills (torsemide).  Please follow up with cardiology for your congestive heart failure.   Dr. Enzo Bi - -   Increase activity slowly   Complete by: As directed        Hospital Course:  For full details, please see H&P, progress notes, consult notes and ancillary notes.  Briefly,  Randy Howard a 79 y.o.Caucasian malewith medical history significant forCAD s/p CABG, PVD, OSA, emphysema who presented to the ER for evaluation after he fell landing on his face and anterior chest wall.  Patient was playing bocce ball and tripped and felllanding on his face and chest. He has bilateral lower extremity swelling which is chronic and he is always short of breath with minimal exertion.    Fall, mechanical Patient presented to the ER after he sustaineda mechanical fall and landed face forward.  He had complaints of pain involving anterior chest wall with imaging study negative for rib fractures or maxillofacial fractures.  PT eval and recommended HHPT.  # Dyspnea and LE swelling 2/2 chronic systolic CHF # Pulm HTN Pt's last Echo on record showed LVEF 30% in July 2019.  Pt was on torsemide at home.  Pt received IV diuresis on presentation with subsequent improvement in Cr,  suggesting congestion.  BNP 3835.  TTE during current hospitalization showed LVEF 25-30%, and severely elevated pulm artery systolic pressure.  Pt was continued on IV lasix 40  mg BID with improvement in Cr, good urine output and improvement in BLE swelling.  Pt was eager to be discharged, and since he was stable on room air, he was discharged on his previous home torsemide.  Cardiology consulted on admission and will continue to follow pt as outpatient.  Pt was not on a BB PTA, and one was not started during this hospitalization due to HR already in 50's (and cards didn't recommend starting one).  Home benazepril held due to AKI, and was resumed at lower dose of 20 mg daily on discharged to avoid hypotension.  # Hx of CAD s/p CABG in 2006 # PVD continued home ASA, plavix, statin  # AKI, improved Cr 1.87 on presentation.  Last known 1.07 in Aug 2020.  Cr improved with diuresis.  On the day of discharge, Cr 1.40.  Chronic venous insufficiency Patient has bilateral lower extremity swelling which is chronic but worse when compared to his baseline.  Can not tolerate compression stocking.  BLE swelling improved with IV diuresis.  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  BPH Continued finasteride and Flomax  Elevated troponin, not ACS Flat in 40's.  Maybe due to CHF and demand ischemia.   Discharge Diagnoses:  Principal Problem:   Fall at home, initial encounter Active Problems:   Hypertension   Chronic venous insufficiency   CAD (coronary artery disease)   Type 2 diabetes mellitus with stage 3 chronic  kidney disease ([place])   BPH (benign prostatic hyperplasia)   Fall   Systolic heart failure [facility])    Discharge Instructions:  Allergies as of 07/23/2019      Reactions   Oxycodone Nausea Only      Medication List    TAKE these medications   aspirin EC 81 MG tablet Take 1 tablet (81 mg total) by mouth daily.   atorvastatin 10 MG tablet Commonly known as: LIPITOR Take 10 mg by mouth daily.   baclofen 10 MG tablet Commonly known as: LIORESAL Take 1 tablet (10 mg total) by mouth  3 (three) times daily as needed for muscle spasms.   benazepril 20 MG tablet Commonly known as: LOTENSIN Take 1 tablet (20 mg total) by mouth daily. This is a decrease from your previous 40 mg daily. What changed:   medication strength  how much to take  additional instructions   busPIRone 5 MG tablet Commonly known as: BUSPAR Take 1 tablet (5 mg total) by mouth at bedtime as needed (anxiety).   clopidogrel 75 MG tablet Commonly known as: PLAVIX TAKE 1 TABLET BY MOUTH ONCE DAILY.   feeding supplement (ENSURE ENLIVE) Liqd Take 237 mLs by mouth 2 (two) times daily between meals. Or equivalent Boosts.   finasteride 5 MG tablet Commonly known as: PROSCAR TAKE 1 TABLET BY MOUTH ONCE DAILY   fluticasone 50 MCG/ACT nasal spray Commonly known as: FLONASE Place 2 sprays into both nostrils daily. Use for 4-6 weeks then stop and use seasonally or as needed.   gabapentin 100 MG capsule Commonly known as: NEURONTIN Take 1-2 capsules (100-200 mg total) by mouth See admin instructions. Take 2 capsules (200mg ) by mouth every morning and take 1 to 2 capsules (100mg -200mg ) by mouth every night   glimepiride 4 MG tablet Commonly known as: AMARYL TAKE 1 TABLET BY MOUTH ONCE EVERY MORNING What changed: See the new instructions.   hydrOXYzine 10 MG tablet Commonly known as: ATARAX/VISTARIL Take 1 tablet (10 mg total) by mouth 3 (three) times daily as needed for itching or anxiety.   Magnesium Oxide (Antacid) 500 MG Caps Take 500 mg by mouth daily as needed (stomach acid).   nitroGLYCERIN 0.4 MG SL tablet Commonly known as: NITROSTAT Place 1 tablet (0.4 mg total) under the tongue every 5 (five) minutes as needed for chest pain.   Potassium 99 MG Tabs Take 99 mg by mouth daily.   tamsulosin 0.4 MG Caps capsule Commonly known as: FLOMAX Take 1 capsule (0.4 mg total) by mouth daily. What changed: See the new instructions.   torsemide 20 MG tablet Commonly known as: DEMADEX Take 1  tablet (20 mg total) by mouth daily. (take 1 additional tablet (20mg ) by mouth daily as needed for excess weight)       Follow-up Information    Teodoro Spray, MD. Schedule an appointment as soon as possible for a visit in 2 week(s).   Specialty: Cardiology Contact information: [place] [address] [phone]        Olin Hauser, DO. Schedule an appointment as soon as possible for a visit in 1 week(s).   Specialty: Family Medicine Contact information: [address] [place] [address] [phone]           Allergies  Allergen Reactions  . Oxycodone Nausea Only     The results of significant diagnostics from this hospitalization (including imaging, microbiology, ancillary and laboratory) are listed below for reference.   Consultations:  Procedures/Studies: CT Head Wo Contrast  Result Date: 07/21/2019 CLINICAL DATA:  Fall, facial trauma EXAM: CT HEAD WITHOUT CONTRAST CT MAXILLOFACIAL WITHOUT CONTRAST CT CERVICAL SPINE WITHOUT CONTRAST TECHNIQUE: Multidetector CT imaging of the head, cervical spine, and maxillofacial structures were performed using the standard protocol without intravenous contrast. Multiplanar CT image reconstructions of the cervical spine and maxillofacial structures were also generated. COMPARISON:  None. FINDINGS: CT HEAD FINDINGS Brain: No evidence of acute infarction, hemorrhage, hydrocephalus, extra-axial collection or mass lesion/mass effect. Mild age related cerebral volume loss. Vascular: Atherosclerotic calcifications involving the large vessels of the skull base. No unexpected hyperdense vessel. Skull: Normal. Negative for fracture or focal lesion. Other: None. CT MAXILLOFACIAL FINDINGS Osseous: No fracture or mandibular dislocation. Bony orbital walls are intact. Maxillary sinus walls intact. No destructive process. Orbits: Negative. No traumatic or inflammatory finding. Sinuses: Paranasal sinuses and mastoid  air cells are clear without mucosal thickening, opacification, or air-fluid level. Soft tissues: No focal soft tissue hematoma. CT CERVICAL SPINE FINDINGS Alignment: Physiologic alignment without static listhesis. Facet joints are aligned without dislocation. Skull base and vertebrae: No acute fracture. No primary bone lesion or focal pathologic process. The right C3-4 facet joint is fused. Soft tissues and spinal canal: No prevertebral fluid or swelling. No visible canal hematoma. Disc levels: Multilevel intervertebral disc height loss with degenerative endplate spurring. Mild multilevel facet arthropathy. No evidence of high-grade foraminal or canal stenosis by CT. Upper chest: Visualized lung apices clear. Other: Vertebral and carotid artery atherosclerosis. IMPRESSION: 1. No acute intracranial findings. 2. No evidence of acute maxillofacial bone fracture. 3. No acute fracture or static listhesis of the cervical spine. 4. 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 Electronically Signed   By: Davina Poke D.O.   On: 07/21/2019 13:18   CT Chest Wo Contrast  Result Date: 07/21/2019 CLINICAL DATA:  Fall. EXAM: CT CHEST WITHOUT CONTRAST TECHNIQUE: Multidetector CT imaging of the chest was performed following the standard protocol without IV contrast. COMPARISON:  None FINDINGS: Cardiovascular: There is mild cardiac enlargement. Aortic atherosclerosis. Previous median sternotomy the and CABG procedure. Extensive aortic atherosclerosis. Mediastinum/Nodes: No enlarged mediastinal or axillary lymph nodes. Thyroid gland, trachea, and esophagus demonstrate no significant findings. Lungs/Pleura: Mild changes of emphysema. No airspace consolidation, atelectasis or pneumothorax. Upper Abdomen: No acute abnormality. Aortic atherosclerosis. Musculoskeletal: Previous median sternotomy. Marked thoracic kyphosis. Multi level disc space narrowing and endplate spurring identified. No  acute findings IMPRESSION: 1. No acute cardiopulmonary abnormalities. 2. Emphysema and aortic atherosclerosis. 3. Marked thoracic kyphosis. Aortic Atherosclerosis (ICD10-I70.0) and Emphysema (ICD10-J43.9). Electronically Signed   By: Kerby Moors M.D.   On: 07/21/2019 15:02   CT Cervical Spine Wo Contrast  Result Date: 07/21/2019 CLINICAL DATA:  Fall, facial trauma EXAM: CT HEAD WITHOUT CONTRAST CT MAXILLOFACIAL WITHOUT CONTRAST CT CERVICAL SPINE WITHOUT CONTRAST TECHNIQUE: Multidetector CT imaging of the head, cervical spine, and maxillofacial structures were performed using the standard protocol without intravenous contrast. Multiplanar CT image reconstructions of the cervical spine and maxillofacial structures were also generated. COMPARISON:  None. FINDINGS: CT HEAD FINDINGS Brain: No evidence of acute infarction, hemorrhage, hydrocephalus, extra-axial collection or mass lesion/mass effect. Mild age related cerebral volume loss. Vascular: Atherosclerotic calcifications involving the large vessels of the skull base. No unexpected hyperdense vessel. Skull: Normal. Negative for fracture or focal lesion. Other: None. CT MAXILLOFACIAL FINDINGS Osseous: No fracture or mandibular dislocation. Bony orbital walls are intact. Maxillary sinus walls intact. No destructive process. Orbits: Negative. No traumatic or inflammatory  finding. Sinuses: Paranasal sinuses and mastoid air cells are clear without mucosal thickening, opacification, or air-fluid level. Soft tissues: No focal soft tissue hematoma. CT CERVICAL SPINE FINDINGS Alignment: Physiologic alignment without static listhesis. Facet joints are aligned without dislocation. Skull base and vertebrae: No acute fracture. No primary bone lesion or focal pathologic process. The right C3-4 facet joint is fused. Soft tissues and spinal canal: No prevertebral fluid or swelling. No visible canal hematoma. Disc levels: Multilevel intervertebral disc height loss with  degenerative endplate spurring. Mild multilevel facet arthropathy. No evidence of high-grade foraminal or canal stenosis by CT. Upper chest: Visualized lung apices clear. Other: Vertebral and carotid artery atherosclerosis. IMPRESSION: 1. No acute intracranial findings. 2. No evidence of acute maxillofacial bone fracture. 3. No acute fracture or static listhesis of the cervical spine. 4. 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 Electronically Signed   By: Davina Poke D.O.   On: 07/21/2019 13:18   DG Chest Portable 1 View  Result Date: 07/21/2019 CLINICAL DATA:  Pain. Fall. Patient tripped and fell forward on his face. Legs feel heavy. Pitting edema. EXAM: PORTABLE CHEST 1 VIEW COMPARISON:  11/05/2015 FINDINGS: Previous median sternotomy and CABG. The heart is enlarged, increased in size compared with the previous study. There is significant atherosclerotic calcification of the thoracic aorta. The lungs are clear. No focal consolidation or pulmonary edema. No acute displaced fracture. No pneumothorax. IMPRESSION: 1. Cardiomegaly. 2. No evidence for acute abnormality. Electronically Signed   By: Nolon Nations M.D.   On: 07/21/2019 13:14   ECHOCARDIOGRAM COMPLETE  Result Date: 07/22/2019    ECHOCARDIOGRAM REPORT   Patient Name:   Randy Howard Date of Exam: 07/22/2019 Medical Rec #:  [id number]      Height:       71.0 in Accession #:    [id number]     Weight:       178.5 lb Date of Birth:  19-Jan-1941      BSA:          2.009 m Patient Age:    73 years       BP:           125/63 mmHg Patient Gender: M              HR:           59 bpm. Exam Location:  [facility] Procedure: 2D Echo, Cardiac Doppler and Color Doppler Indications:     Cardiomegaly 429.3  History:         Patient has no prior history of Echocardiogram examinations.                  Risk Factors:Hypertension. PVD.  Sonographer:     Sherrie Sport RDCS (AE) Referring Phys:  [id number] Royce Macadamia AGBATA Diagnosing Phys:  Bartholome Bill MD IMPRESSIONS  1. Left ventricular ejection fraction, by estimation, is 25 to 30%. The left ventricle has severely decreased function. The left ventricle has no regional wall motion abnormalities. The left ventricular internal cavity size was mildly dilated. Left ventricular diastolic parameters were normal.  2. Right ventricular systolic function is normal. The right ventricular size is mildly enlarged. There is severely elevated pulmonary artery systolic pressure.  3. Left atrial size was mildly dilated.  4. Right atrial size was mildly dilated.  5. The mitral valve is grossly normal. Mild to moderate mitral valve regurgitation.  6. Tricuspid valve regurgitation is moderate.  7. The aortic valve is tricuspid. Aortic  valve regurgitation is trivial. Mild to moderate aortic valve sclerosis/calcification is present, without any evidence of aortic stenosis. FINDINGS  Left Ventricle: Left ventricular ejection fraction, by estimation, is 25 to 30%. The left ventricle has severely decreased function. The left ventricle has no regional wall motion abnormalities. The left ventricular internal cavity size was mildly dilated. There is no left ventricular hypertrophy. Left ventricular diastolic parameters were normal. Right Ventricle: The right ventricular size is mildly enlarged. No increase in right ventricular wall thickness. Right ventricular systolic function is normal. There is severely elevated pulmonary artery systolic pressure. The tricuspid regurgitant velocity is 3.66 m/s, and with an assumed right atrial pressure of 10 mmHg, the estimated right ventricular systolic pressure is [id number] mmHg. Left Atrium: Left atrial size was mildly dilated. Right Atrium: Right atrial size was mildly dilated. Pericardium: There is no evidence of pericardial effusion. Mitral Valve: The mitral valve is grossly normal. Mild to moderate mitral valve regurgitation. Tricuspid Valve: The tricuspid valve is grossly normal.  Tricuspid valve regurgitation is moderate. Aortic Valve: The aortic valve is tricuspid. Aortic valve regurgitation is trivial. Mild to moderate aortic valve sclerosis/calcification is present, without any evidence of aortic stenosis. Aortic valve mean gradient measures 2.7 mmHg. Aortic valve peak  gradient measures 5.0 mmHg. Aortic valve area, by VTI measures 1.36 cm. Pulmonic Valve: The pulmonic valve was normal in structure. Pulmonic valve regurgitation is trivial. Aorta: The aortic root is normal in size and structure. IAS/Shunts: The interatrial septum was not assessed.  LEFT VENTRICLE PLAX 2D LVIDd:         4.99 cm LVIDs:         4.86 cm LV PW:         1.11 cm LV IVS:        0.92 cm LVOT diam:     2.00 cm LV SV:         28 LV SV Index:   14 LVOT Area:     3.14 cm  LV Volumes (MOD) LV vol d, MOD A2C: 146.0 ml LV vol d, MOD A4C: 149.0 ml LV vol s, MOD A2C: 109.0 ml LV vol s, MOD A4C: 110.0 ml LV SV MOD A2C:     37.0 ml LV SV MOD A4C:     149.0 ml LV SV MOD BP:      39.5 ml RIGHT VENTRICLE RV Basal diam:  5.23 cm RV S prime:     4.46 cm/s TAPSE (M-mode): 3.0 cm LEFT ATRIUM             Index       RIGHT ATRIUM           Index LA diam:        6.00 cm 2.99 cm/m  RA Area:     24.20 cm LA Vol (A2C):   83.9 ml 41.76 ml/m RA Volume:   84.30 ml  41.96 ml/m LA Vol (A4C):   82.3 ml 40.96 ml/m LA Biplane Vol: 83.1 ml 41.36 ml/m  AORTIC VALVE                   PULMONIC VALVE AV Area (Vmax):    1.30 cm    PV Vmax:        0.53 m/s AV Area (Vmean):   1.46 cm    PV Peak grad:   1.1 mmHg AV Area (VTI):     1.36 cm    RVOT Peak grad: 2 mmHg AV Vmax:  111.90 cm/s AV Vmean:          70.633 cm/s AV VTI:            0.206 m AV Peak Grad:      5.0 mmHg AV Mean Grad:      2.7 mmHg LVOT Vmax:         46.30 cm/s LVOT Vmean:        32.900 cm/s LVOT VTI:          0.089 m LVOT/AV VTI ratio: 0.43  AORTA Ao Root diam: 3.20 cm MITRAL VALVE               TRICUSPID VALVE MV Area (PHT): 4.12 cm    TR Peak grad:   53.6 mmHg MV  Decel Time: 184 msec    TR Vmax:        366.00 cm/s MV E velocity: 78.80 cm/s                            SHUNTS                            Systemic VTI:  0.09 m                            Systemic Diam: 2.00 cm Bartholome Bill MD Electronically signed by Bartholome Bill MD Signature Date/Time: 07/22/2019/12:49:07 PM    Final    CT Maxillofacial Wo Contrast  Result Date: 07/21/2019 CLINICAL DATA:  Fall, facial trauma EXAM: CT HEAD WITHOUT CONTRAST CT MAXILLOFACIAL WITHOUT CONTRAST CT CERVICAL SPINE WITHOUT CONTRAST TECHNIQUE: Multidetector CT imaging of the head, cervical spine, and maxillofacial structures were performed using the standard protocol without intravenous contrast. Multiplanar CT image reconstructions of the cervical spine and maxillofacial structures were also generated. COMPARISON:  None. FINDINGS: CT HEAD FINDINGS Brain: No evidence of acute infarction, hemorrhage, hydrocephalus, extra-axial collection or mass lesion/mass effect. Mild age related cerebral volume loss. Vascular: Atherosclerotic calcifications involving the large vessels of the skull base. No unexpected hyperdense vessel. Skull: Normal. Negative for fracture or focal lesion. Other: None. CT MAXILLOFACIAL FINDINGS Osseous: No fracture or mandibular dislocation. Bony orbital walls are intact. Maxillary sinus walls intact. No destructive process. Orbits: Negative. No traumatic or inflammatory finding. Sinuses: Paranasal sinuses and mastoid air cells are clear without mucosal thickening, opacification, or air-fluid level. Soft tissues: No focal soft tissue hematoma. CT CERVICAL SPINE FINDINGS Alignment: Physiologic alignment without static listhesis. Facet joints are aligned without dislocation. Skull base and vertebrae: No acute fracture. No primary bone lesion or focal pathologic process. The right C3-4 facet joint is fused. Soft tissues and spinal canal: No prevertebral fluid or swelling. No visible canal hematoma. Disc levels: Multilevel  intervertebral disc height loss with degenerative endplate spurring. Mild multilevel facet arthropathy. No evidence of high-grade foraminal or canal stenosis by CT. Upper chest: Visualized lung apices clear. Other: Vertebral and carotid artery atherosclerosis. IMPRESSION: 1. No acute intracranial findings. 2. No evidence of acute maxillofacial bone fracture. 3. No acute fracture or static listhesis of the cervical spine. 4. 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 Electronically Signed   By: Davina Poke D.O.   On: 07/21/2019 13:18      Labs: BNP (last 3 results) Recent Labs    07/21/19 1315  BNP 0000000*   Basic Metabolic Panel: Recent Labs  Lab 07/21/19 1315 07/22/19 0422 07/23/19 0413  NA 138 137 140  K 4.0 3.7 3.4*  CL 103 104 104  CO2 27 25 28   GLUCOSE 165* 137* 122*  BUN 31* 29* 26*  CREATININE 1.87* 1.63* 1.40*  CALCIUM 9.1 8.6* 8.5*  MG  --   --  2.0   Liver Function Tests: Recent Labs  Lab 07/21/19 1315  AST 35  ALT 31  ALKPHOS 75  BILITOT 1.4*  PROT 6.2*  ALBUMIN 3.4*   No results for input(s): LIPASE, AMYLASE in the last 168 hours. No results for input(s): AMMONIA in the last 168 hours. CBC: Recent Labs  Lab 07/21/19 1315 07/22/19 0422 07/23/19 0413  WBC 8.3 7.3 7.7  NEUTROABS 6.6  --   --   HGB 13.5 12.1* 12.6*  HCT 41.1 36.2* 38.1*  MCV 89.3 89.2 89.4  PLT 163 147* 146*   Cardiac Enzymes: Recent Labs  Lab 07/21/19 1905  CKTOTAL 268   BNP: Invalid input(s): POCBNP CBG: Recent Labs  Lab 07/21/19 2103 07/22/19 0742 07/22/19 1203 07/22/19 1613 07/22/19 2111  GLUCAP 174* 119* 133* 122* 111*   D-Dimer No results for input(s): DDIMER in the last 72 hours. Hgb A1c No results for input(s): HGBA1C in the last 72 hours. Lipid Profile No results for input(s): CHOL, HDL, LDLCALC, TRIG, CHOLHDL, LDLDIRECT in the last 72 hours. Thyroid function studies No results for input(s): TSH, T4TOTAL,  T3FREE, THYROIDAB in the last 72 hours.  Invalid input(s): FREET3 Anemia work up No results for input(s): VITAMINB12, FOLATE, FERRITIN, TIBC, IRON, RETICCTPCT in the last 72 hours. Urinalysis No results found for: COLORURINE, APPEARANCEUR, [place], [place], [place], [place], [place], [place], PROTEINUR, UROBILINOGEN, NITRITE, LEUKOCYTESUR Sepsis Labs Invalid input(s): PROCALCITONIN,  WBC,  LACTICIDVEN Microbiology Recent Results (from the past 240 hour(s))  Respiratory Panel by RT PCR (Flu A&B, Covid) - Nasopharyngeal Swab     Status: None   Collection Time: 07/21/19  3:26 PM   Specimen: Nasopharyngeal Swab  Result Value Ref Range Status   SARS Coronavirus 2 by RT PCR NEGATIVE NEGATIVE Final    Comment: (NOTE) SARS-CoV-2 target nucleic acids are NOT DETECTED. The SARS-CoV-2 RNA is generally detectable in upper respiratoy specimens during the acute phase of infection. The lowest concentration of SARS-CoV-2 viral copies this assay can detect is 131 copies/mL. A negative result does not preclude SARS-Cov-2 infection and should not be used as the sole basis for treatment or other patient management decisions. A negative result may occur with  improper specimen collection/handling, submission of specimen other than nasopharyngeal swab, presence of viral mutation(s) within the areas targeted by this assay, and inadequate number of viral copies (<131 copies/mL). A negative result must be combined with clinical observations, patient history, and epidemiological information. The expected result is Negative. Fact Sheet for Patients:  PinkCheek.be Fact Sheet for Healthcare Providers:  GravelBags.it This test is not yet ap proved or cleared by the [place] FDA and  has been authorized for detection and/or diagnosis of SARS-CoV-2 by FDA under an Emergency Use Authorization (EUA). This EUA will remain  in effect (meaning this  test can be used) for the duration of the COVID-19 declaration under Section 564(b)(1) of the Act, 21 U.S.C. section 360bbb-3(b)(1), unless the authorization is terminated or revoked sooner.    Influenza A by PCR NEGATIVE NEGATIVE Final   Influenza B by PCR NEGATIVE NEGATIVE Final    Comment: (NOTE) The Xpert Xpress SARS-CoV-2/FLU/RSV assay is intended as an aid in  the  diagnosis of influenza from Nasopharyngeal swab specimens and  should not be used as a sole basis for treatment. Nasal washings and  aspirates are unacceptable for Xpert Xpress SARS-CoV-2/FLU/RSV  testing. Fact Sheet for Patients: PinkCheek.be Fact Sheet for Healthcare Providers: GravelBags.it This test is not yet approved or cleared by the [place] FDA and  has been authorized for detection and/or diagnosis of SARS-CoV-2 by  FDA under an Emergency Use Authorization (EUA). This EUA will remain  in effect (meaning this test can be used) for the duration of the  Covid-19 declaration under Section 564(b)(1) of the Act, 21  U.S.C. section 360bbb-3(b)(1), unless the authorization is  terminated or revoked. Performed at [facility], [address] [place], [place] [address]      Total time spend on discharging this patient, including the last patient exam, discussing the hospital stay, instructions for ongoing care as it relates to all pertinent caregivers, as well as preparing the medical discharge records, prescriptions, and/or referrals as applicable, is 40 minutes.    Enzo Bi, MD  [facility] 07/23/2019, 9:08 AM  If 7PM-7AM, please contact night-coverage

## 2019-07-23 NOTE — TOC Transition Note (Signed)
Transition of Care [facility]) - CM/SW Discharge Note   Patient Details  Name: Randy Howard MRN: [id number] Date of Birth: 10-Sep-1940  [facility] CM/SW Contact:  Boris Sharper, LCSW Phone Number: 07/23/2019, 11:06 AM   Clinical Narrative:    Pt medically stable for discharge per MD. CSW notified pt of PT recommendations and pt declined [place] services.    Final next level of care: Home/Self Care Barriers to Discharge: No Barriers Identified   Patient Goals and CMS Choice        Discharge Placement                  Name of family member notified: [name] Patient and family notified of of transfer: 07/23/19  Discharge Plan and Services                                     Social Determinants of Health (SDOH) Interventions     Readmission Risk Interventions No flowsheet data found.

## 2019-07-23 NOTE — Progress Notes (Signed)
   Patient Name: Randy Howard Date of Encounter: 07/23/2019  Hospital Problem List     Principal Problem:   Fall at home, initial encounter Active Problems:   Hypertension   Chronic venous insufficiency   CAD (coronary artery disease)   Type 2 diabetes mellitus with stage 3 chronic kidney disease (HCC)   BPH (benign prostatic hyperplasia)   Fall   Systolic heart failure ([place])    Okay for discharge from a cardiac standpoint.  Will see in office next week. Signed, Javier Docker Lori Liew MD 07/23/2019, 10:25 AM  Pager: [phone]

## 2019-07-23 NOTE — Progress Notes (Signed)
Patient is adequate for discharge. IV removed, discharge teaching complete with AVS given, and phone number to Medical Records given to patient per his request.   Patient is currently (2:04 PM) waiting for his friend to pick him up and take him home. He walked several laps around the nurses station with the NT, with this RN and Dr. Billie Ruddy witnessing also. Patient is steady with his cane and reports feeling "good." VSS.

## 2019-07-25 ENCOUNTER — Telehealth: Payer: Self-pay

## 2019-07-25 LAB — GLUCOSE, CAPILLARY: Glucose-Capillary: 94 mg/dL (ref 70–99)

## 2019-07-25 NOTE — Telephone Encounter (Signed)
I have made the 2nd attempt to contact the patient or family member in charge, in order to follow up from recently being discharged from the hospital. I left a message on voicemail requesting a CB. -MM  

## 2019-07-25 NOTE — Telephone Encounter (Signed)
HFU not scheduled. 

## 2019-07-25 NOTE — Telephone Encounter (Signed)
I have made the 1st attempt to contact the patient or family member in charge, in order to follow up from recently being discharged from the hospital. I left a message on voicemail requesting a CB. -MM 

## 2019-08-02 ENCOUNTER — Encounter (INDEPENDENT_AMBULATORY_CARE_PROVIDER_SITE_OTHER): Payer: Medicare Other

## 2019-08-02 ENCOUNTER — Ambulatory Visit (INDEPENDENT_AMBULATORY_CARE_PROVIDER_SITE_OTHER): Payer: Medicare Other | Admitting: Vascular Surgery

## 2019-08-02 ENCOUNTER — Ambulatory Visit (INDEPENDENT_AMBULATORY_CARE_PROVIDER_SITE_OTHER): Payer: Medicare Other

## 2019-08-02 ENCOUNTER — Other Ambulatory Visit: Payer: Self-pay

## 2019-08-02 ENCOUNTER — Encounter (INDEPENDENT_AMBULATORY_CARE_PROVIDER_SITE_OTHER): Payer: Self-pay

## 2019-08-02 DIAGNOSIS — L97519 Non-pressure chronic ulcer of other part of right foot with unspecified severity: Secondary | ICD-10-CM

## 2019-08-02 DIAGNOSIS — M79604 Pain in right leg: Secondary | ICD-10-CM

## 2019-08-02 DIAGNOSIS — M79605 Pain in left leg: Secondary | ICD-10-CM

## 2019-08-02 DIAGNOSIS — L97529 Non-pressure chronic ulcer of other part of left foot with unspecified severity: Secondary | ICD-10-CM

## 2019-08-02 DIAGNOSIS — M7989 Other specified soft tissue disorders: Secondary | ICD-10-CM

## 2019-08-12 ENCOUNTER — Encounter (INDEPENDENT_AMBULATORY_CARE_PROVIDER_SITE_OTHER): Payer: Self-pay | Admitting: Vascular Surgery

## 2019-08-12 ENCOUNTER — Ambulatory Visit (INDEPENDENT_AMBULATORY_CARE_PROVIDER_SITE_OTHER): Payer: Medicare Other | Admitting: Vascular Surgery

## 2019-08-12 ENCOUNTER — Other Ambulatory Visit: Payer: Self-pay

## 2019-08-12 VITALS — BP 109/66 | HR 64 | Ht 71.0 in | Wt 157.0 lb

## 2019-08-12 DIAGNOSIS — M7989 Other specified soft tissue disorders: Secondary | ICD-10-CM

## 2019-08-12 DIAGNOSIS — I7025 Atherosclerosis of native arteries of other extremities with ulceration: Secondary | ICD-10-CM | POA: Diagnosis not present

## 2019-08-12 DIAGNOSIS — E1122 Type 2 diabetes mellitus with diabetic chronic kidney disease: Secondary | ICD-10-CM

## 2019-08-12 DIAGNOSIS — N183 Chronic kidney disease, stage 3 unspecified: Secondary | ICD-10-CM

## 2019-08-12 DIAGNOSIS — I10 Essential (primary) hypertension: Secondary | ICD-10-CM | POA: Diagnosis not present

## 2019-08-12 NOTE — Assessment & Plan Note (Signed)
blood glucose control important in reducing the progression of atherosclerotic disease. Also, involved in wound healing. On appropriate medications.  

## 2019-08-12 NOTE — Assessment & Plan Note (Signed)
His swelling is gotten much worse.  He is weeping from the tissue in the calves and this needs to be wrapped in Unna boots.  A 3 layer Unna boot was placed today and will be changed weekly.  We can reassess this in 3 to 4 weeks to see if he can come off.  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  A lymphedema pump would likely be a good adjuvant therapy to improve his leg swelling after we come out of Unna boots.

## 2019-08-12 NOTE — Progress Notes (Signed)
MRN : [id number]  Randy Howard is a 79 y.o. (Apr 14, 1940) male who presents with chief complaint of  Chief Complaint  Patient presents with  . Follow-up    U/S results  .  History of Present Illness: Patient returns today in follow up of his PAD, leg swelling, and lower extremity ulcerations.  He has marked swelling in both legs with weeping from the legs.  He has a chronic hole in the bottom of his left foot and a callus on the right foot that are poorly healing.  His legs are very heavy and tired.  It is limiting his walking.  He actually had a fall about 2 weeks ago from his legs not working well where he was admitted to the hospital with a fair amount of facial trauma.  No fevers or chills.  No chest pain or shortness of breath other than his chronic shortness of breath with activity.  Current Outpatient Medications  Medication Sig Dispense Refill  . aspirin EC 81 MG tablet Take 1 tablet (81 mg total) by mouth daily. 150 tablet 2  . atorvastatin (LIPITOR) 10 MG tablet Take 10 mg by mouth daily.    . baclofen (LIORESAL) 10 MG tablet Take 1 tablet (10 mg total) by mouth 3 (three) times daily as needed for muscle spasms.    . benazepril (LOTENSIN) 20 MG tablet Take 1 tablet (20 mg total) by mouth daily. This is a decrease from your previous 40 mg daily. 30 tablet 2  . busPIRone (BUSPAR) 5 MG tablet Take 1 tablet (5 mg total) by mouth at bedtime as needed (anxiety).    . clopidogrel (PLAVIX) 75 MG tablet TAKE 1 TABLET BY MOUTH ONCE DAILY. (Patient taking differently: Take 75 mg by mouth daily. ) 90 tablet 3  . feeding supplement, ENSURE ENLIVE, (ENSURE ENLIVE) LIQD Take 237 mLs by mouth 2 (two) times daily between meals. Or equivalent Boosts. 237 mL 12  . finasteride (PROSCAR) 5 MG tablet TAKE 1 TABLET BY MOUTH ONCE DAILY (Patient taking differently: Take 5 mg by mouth daily. ) 90 tablet 0  . fluticasone (FLONASE) 50 MCG/ACT nasal spray Place 2 sprays into both nostrils daily. Use for 4-6  weeks then stop and use seasonally or as needed. 16 g 3  . gabapentin (NEURONTIN) 100 MG capsule Take 1-2 capsules (100-200 mg total) by mouth See admin instructions. Take 2 capsules (200mg ) by mouth every morning and take 1 to 2 capsules (100mg -200mg ) by mouth every night    . glimepiride (AMARYL) 4 MG tablet TAKE 1 TABLET BY MOUTH ONCE EVERY MORNING (Patient taking differently: Take 4 mg by mouth daily. ) 90 tablet 1  . hydrOXYzine (ATARAX/VISTARIL) 10 MG tablet Take 1 tablet (10 mg total) by mouth 3 (three) times daily as needed for itching or anxiety.    . Magnesium Oxide, Antacid, 500 MG CAPS Take 500 mg by mouth daily as needed (stomach acid).     . nitroGLYCERIN (NITROSTAT) 0.4 MG SL tablet Place 1 tablet (0.4 mg total) under the tongue every 5 (five) minutes as needed for chest pain. 50 tablet 3  . Potassium 99 MG TABS Take 99 mg by mouth daily.     . tamsulosin (FLOMAX) 0.4 MG CAPS capsule Take 1 capsule (0.4 mg total) by mouth daily.    Marland Kitchen torsemide (DEMADEX) 20 MG tablet Take 1 tablet (20 mg total) by mouth daily. (take 1 additional tablet (20mg ) by mouth daily as needed for excess weight) 30  tablet 0   No current facility-administered medications for this visit.    Past Medical History:  Diagnosis Date  . Arthritis   . Coronary artery disease   . Dyspnea   . Elevated lipids   . Hyperlipidemia   . Hypertension   . Neuropathy   . Nocturia   . Obstructive sleep apnea   . Peripheral vascular disease ([place])   . Prostate enlargement   . PVD (peripheral vascular disease) ([place])   . Right carotid bruit   . Skin cancer    Followed by Dr. Evorn Gong  . Skin cancer   . Tremor     Past Surgical History:  Procedure Laterality Date  . CARDIAC CATHETERIZATION    . CAROTID ARTERY ANGIOPLASTY Left 09/30/2011  . CORONARY ARTERY BYPASS GRAFT  2006  . ENDARTERECTOMY FEMORAL Bilateral 11/01/2014   Procedure: ENDARTERECTOMY FEMORAL;  Surgeon: Algernon Huxley, MD;  Location: [facility];  Service:  Vascular;  Laterality: Bilateral;  . LOWER EXTREMITY ANGIOGRAPHY Left 04/26/2018   Procedure: LOWER EXTREMITY ANGIOGRAPHY;  Surgeon: Algernon Huxley, MD;  Location: [facility];  Service: Cardiovascular;  Laterality: Left;  . LOWER EXTREMITY ANGIOGRAPHY Right 09/23/2018   Procedure: LOWER EXTREMITY ANGIOGRAPHY;  Surgeon: Algernon Huxley, MD;  Location: [facility];  Service: Cardiovascular;  Laterality: Right;  . LOWER EXTREMITY ANGIOGRAPHY Right 11/12/2018   Procedure: LOWER EXTREMITY ANGIOGRAPHY;  Surgeon: Algernon Huxley, MD;  Location: [facility];  Service: Cardiovascular;  Laterality: Right;  . PERIPHERAL VASCULAR CATHETERIZATION Left 10/09/2014   Procedure: Lower Extremity Angiography;  Surgeon: Algernon Huxley, MD;  Location: [facility];  Service: Cardiovascular;  Laterality: Left;  . PERIPHERAL VASCULAR CATHETERIZATION  10/09/2014   Procedure: Lower Extremity Intervention;  Surgeon: Algernon Huxley, MD;  Location: [facility];  Service: Cardiovascular;;     Social History   Tobacco Use  . Smoking status: Former Smoker    Packs/day: 1.00    Years: 25.00    Pack years: 25.00    Types: Cigarettes    Quit date: 03/24/1985    Years since quitting: 34.4  . Smokeless tobacco: Former Network engineer Use Topics  . Alcohol use: No    Alcohol/week: 0.0 standard drinks  . Drug use: No       Family History  Problem Relation Age of Onset  . Heart disease Sister   . Heart disease Sister   . Diabetes Sister   . Diabetes Sister   . Diabetes Brother      Allergies  Allergen Reactions  . Oxycodone Nausea Only     REVIEW OF SYSTEMS (Negative unless checked)  Constitutional: [] Weight loss  [] Fever  [] Chills Cardiac: [] Chest pain   [] Chest pressure   [x] Palpitations   [] Shortness of breath when laying flat   [] Shortness of breath at rest   [x] Shortness of breath with exertion. Vascular:  [x] Pain in legs with walking   [x] Pain in legs at rest   [x] Pain in  legs when laying flat   [] Claudication   [] Pain in feet when walking  [] Pain in feet at rest  [] Pain in feet when laying flat   [] History of DVT   [] Phlebitis   [x] Swelling in legs   [] Varicose veins   [x] Non-healing ulcers Pulmonary:   [] Uses home oxygen   [] Productive cough   [] Hemoptysis   [] Wheeze  [] COPD   [] Asthma Neurologic:  [] Dizziness  [] Blackouts   [] Seizures   [] History of stroke   []   History of TIA  [] Aphasia   [] Temporary blindness   [] Dysphagia   [] Weakness or numbness in arms   [] Weakness or numbness in legs Musculoskeletal:  [x] Arthritis   [] Joint swelling   [x] Joint pain   [] Low back pain Hematologic:  [] Easy bruising  [] Easy bleeding   [] Hypercoagulable state   [] Anemic   Gastrointestinal:  [] Blood in stool   [] Vomiting blood  [x] Gastroesophageal reflux/heartburn   [] Abdominal pain Genitourinary:  [x] Chronic kidney disease   [] Difficult urination  [] Frequent urination  [] Burning with urination   [] Hematuria Skin:  [] Rashes   [x] Ulcers   [x] Wounds Psychological:  [] History of anxiety   []  History of major depression.  Physical Examination  BP 109/66   Pulse 64   Ht 5\' 11"  (1.803 m)   Wt 157 lb (71.2 kg)   BMI 21.90 kg/m  Gen:  WD/WN, NAD Head: [place]/AT, No temporalis wasting. Ear/Nose/Throat: Hearing grossly intact, nares w/o erythema or drainage Eyes: Conjunctiva clear. Sclera non-icteric Neck: Supple.  Trachea midline Pulmonary:  Good air movement, no use of accessory muscles.  Cardiac: Irregular Vascular:  Vessel Right Left  Radial Palpable Palpable                          PT  not palpable  not palpable  DP  not palpable  not palpable    Musculoskeletal: M/S 5/5 throughout.  No deformity or atrophy.  Ulcer on the base of the left foot and a callus on the right lateral foot.  There is some weeping from his scabs.  He has marked, 3+ bilateral lower extremity edema. Neurologic: Sensation grossly intact in extremities.  Symmetrical.  Speech is fluent.    Psychiatric: Judgment intact, Mood & affect appropriate for pt's clinical situation. Dermatologic: Wounds as above       Labs Recent Results (from the past 2160 hour(s))  POCT glycosylated hemoglobin (Hb A1C)     Status: Abnormal   Collection Time: 06/08/19  2:29 PM  Result Value Ref Range   Hemoglobin A1C 7.9 (A) 4.0 - 5.6 %  Comprehensive metabolic panel     Status: Abnormal   Collection Time: 07/21/19  1:15 PM  Result Value Ref Range   Sodium 138 135 - 145 mmol/L   Potassium 4.0 3.5 - 5.1 mmol/L   Chloride 103 98 - 111 mmol/L   CO2 27 22 - 32 mmol/L   Glucose, Bld 165 (H) 70 - 99 mg/dL    Comment: Glucose reference range applies only to samples taken after fasting for at least 8 hours.   BUN 31 (H) 8 - 23 mg/dL   Creatinine, Ser 1.87 (H) 0.61 - 1.24 mg/dL   Calcium 9.1 8.9 - 10.3 mg/dL   Total Protein 6.2 (L) 6.5 - 8.1 g/dL   Albumin 3.4 (L) 3.5 - 5.0 g/dL   AST 35 15 - 41 U/L   ALT 31 0 - 44 U/L   Alkaline Phosphatase 75 38 - 126 U/L   Total Bilirubin 1.4 (H) 0.3 - 1.2 mg/dL   GFR calc non Af Amer 33 (L) >60 mL/min   GFR calc Af Amer 38 (L) >60 mL/min   Anion gap 8 5 - 15    Comment: Performed at [facility], [address] [place], [place] [address]  Troponin I (High Sensitivity)     Status: Abnormal   Collection Time: 07/21/19  1:15 PM  Result Value Ref Range   Troponin I (High Sensitivity) 46 (  H) <18 ng/L    Comment: (NOTE) Elevated high sensitivity troponin I (hsTnI) values and significant  changes across serial measurements may suggest ACS but many other  chronic and acute conditions are known to elevate hsTnI results.  Refer to the "Links" section for chest pain algorithms and additional  guidance. Performed at [facility], [address] [place], [place] [address]   CBC with Differential     Status: Abnormal   Collection Time: 07/21/19  1:15 PM  Result Value Ref Range   WBC 8.3 4.0 - 10.5 K/uL   RBC 4.60 4.22 - 5.81 MIL/uL    Hemoglobin 13.5 13.0 - 17.0 g/dL   HCT 41.1 39.0 - 52.0 %   MCV 89.3 80.0 - 100.0 fL   MCH 29.3 26.0 - 34.0 pg   MCHC 32.8 30.0 - 36.0 g/dL   RDW 15.8 (H) 11.5 - 15.5 %   Platelets 163 150 - 400 K/uL   nRBC 0.0 0.0 - 0.2 %   Neutrophils Relative % 80 %   Neutro Abs 6.6 1.7 - 7.7 K/uL   Lymphocytes Relative 11 %   Lymphs Abs 0.9 0.7 - 4.0 K/uL   Monocytes Relative 7 %   Monocytes Absolute 0.6 0.1 - 1.0 K/uL   Eosinophils Relative 1 %   Eosinophils Absolute 0.1 0.0 - 0.5 K/uL   Basophils Relative 0 %   Basophils Absolute 0.0 0.0 - 0.1 K/uL   Immature Granulocytes 1 %   Abs Immature Granulocytes 0.07 0.00 - 0.07 K/uL    Comment: Performed at [facility], [address] [place], [place] [address]  Protime-INR     Status: Abnormal   Collection Time: 07/21/19  1:15 PM  Result Value Ref Range   Prothrombin Time 17.6 (H) 11.4 - 15.2 seconds   INR 1.5 (H) 0.8 - 1.2    Comment: (NOTE) INR goal varies based on device and disease states. Performed at [facility], [address] [place], [place] [address]   Brain natriuretic peptide     Status: Abnormal   Collection Time: 07/21/19  1:15 PM  Result Value Ref Range   B Natriuretic Peptide 3,835.0 (H) 0.0 - 100.0 pg/mL    Comment: Performed at [facility], [place], [place] [address]  Troponin I (High Sensitivity)     Status: Abnormal   Collection Time: 07/21/19  2:09 PM  Result Value Ref Range   Troponin I (High Sensitivity) 44 (H) <18 ng/L    Comment: (NOTE) Elevated high sensitivity troponin I (hsTnI) values and significant  changes across serial measurements may suggest ACS but many other  chronic and acute conditions are known to elevate hsTnI results.  Refer to the "Links" section for chest pain algorithms and additional  guidance. Performed at [facility], [address] [place], [place] [address]   Respiratory Panel by RT PCR (Flu A&B, Covid) - Nasopharyngeal Swab      Status: None   Collection Time: 07/21/19  3:26 PM   Specimen: Nasopharyngeal Swab  Result Value Ref Range   SARS Coronavirus 2 by RT PCR NEGATIVE NEGATIVE    Comment: (NOTE) SARS-CoV-2 target nucleic acids are NOT DETECTED. The SARS-CoV-2 RNA is generally detectable in upper respiratoy specimens during the acute phase of infection. The lowest concentration of SARS-CoV-2 viral copies this assay can detect is 131 copies/mL. A negative result does not preclude SARS-Cov-2 infection and should not be used as the sole basis for treatment  or other patient management decisions. A negative result may occur with  improper specimen collection/handling, submission of specimen other than nasopharyngeal swab, presence of viral mutation(s) within the areas targeted by this assay, and inadequate number of viral copies (<131 copies/mL). A negative result must be combined with clinical observations, patient history, and epidemiological information. The expected result is Negative. Fact Sheet for Patients:  PinkCheek.be Fact Sheet for Healthcare Providers:  GravelBags.it This test is not yet ap proved or cleared by the [place] FDA and  has been authorized for detection and/or diagnosis of SARS-CoV-2 by FDA under an Emergency Use Authorization (EUA). This EUA will remain  in effect (meaning this test can be used) for the duration of the COVID-19 declaration under Section 564(b)(1) of the Act, 21 U.S.C. section 360bbb-3(b)(1), unless the authorization is terminated or revoked sooner.    Influenza A by PCR NEGATIVE NEGATIVE   Influenza B by PCR NEGATIVE NEGATIVE    Comment: (NOTE) The Xpert Xpress SARS-CoV-2/FLU/RSV assay is intended as an aid in  the diagnosis of influenza from Nasopharyngeal swab specimens and  should not be used as a sole basis for treatment. Nasal washings and  aspirates are unacceptable for Xpert Xpress  SARS-CoV-2/FLU/RSV  testing. Fact Sheet for Patients: PinkCheek.be Fact Sheet for Healthcare Providers: GravelBags.it This test is not yet approved or cleared by the [place] FDA and  has been authorized for detection and/or diagnosis of SARS-CoV-2 by  FDA under an Emergency Use Authorization (EUA). This EUA will remain  in effect (meaning this test can be used) for the duration of the  Covid-19 declaration under Section 564(b)(1) of the Act, 21  U.S.C. section 360bbb-3(b)(1), unless the authorization is  terminated or revoked. Performed at [facility], [address] [place], [place] [address]   Glucose, capillary     Status: Abnormal   Collection Time: 07/21/19  4:32 PM  Result Value Ref Range   Glucose-Capillary 166 (H) 70 - 99 mg/dL    Comment: Glucose reference range applies only to samples taken after fasting for at least 8 hours.  Troponin I (High Sensitivity)     Status: Abnormal   Collection Time: 07/21/19  7:05 PM  Result Value Ref Range   Troponin I (High Sensitivity) 42 (H) <18 ng/L    Comment: (NOTE) Elevated high sensitivity troponin I (hsTnI) values and significant  changes across serial measurements may suggest ACS but many other  chronic and acute conditions are known to elevate hsTnI results.  Refer to the "Links" section for chest pain algorithms and additional  guidance. Performed at [facility], [address] [place], [place] [address]   CK     Status: None   Collection Time: 07/21/19  7:05 PM  Result Value Ref Range   Total CK 268 49 - 397 U/L    Comment: Performed at [facility], [address] [place], [place] [address]  Glucose, capillary     Status: Abnormal   Collection Time: 07/21/19  9:03 PM  Result Value Ref Range   Glucose-Capillary 174 (H) 70 - 99 mg/dL    Comment: Glucose reference range applies only to samples taken after fasting for at least  8 hours.  Basic metabolic panel     Status: Abnormal   Collection Time: 07/22/19  4:22 AM  Result Value Ref Range   Sodium 137 135 - 145 mmol/L   Potassium 3.7 3.5 - 5.1 mmol/L   Chloride 104 98 - 111 mmol/L  CO2 25 22 - 32 mmol/L   Glucose, Bld 137 (H) 70 - 99 mg/dL    Comment: Glucose reference range applies only to samples taken after fasting for at least 8 hours.   BUN 29 (H) 8 - 23 mg/dL   Creatinine, Ser 1.63 (H) 0.61 - 1.24 mg/dL   Calcium 8.6 (L) 8.9 - 10.3 mg/dL   GFR calc non Af Amer 39 (L) >60 mL/min   GFR calc Af Amer 45 (L) >60 mL/min   Anion gap 8 5 - 15    Comment: Performed at [facility], [address] [place], [place] [address]  CBC     Status: Abnormal   Collection Time: 07/22/19  4:22 AM  Result Value Ref Range   WBC 7.3 4.0 - 10.5 K/uL   RBC 4.06 (L) 4.22 - 5.81 MIL/uL   Hemoglobin 12.1 (L) 13.0 - 17.0 g/dL   HCT 36.2 (L) 39.0 - 52.0 %   MCV 89.2 80.0 - 100.0 fL   MCH 29.8 26.0 - 34.0 pg   MCHC 33.4 30.0 - 36.0 g/dL   RDW 15.9 (H) 11.5 - 15.5 %   Platelets 147 (L) 150 - 400 K/uL   nRBC 0.0 0.0 - 0.2 %    Comment: Performed at [facility], [address] [place], [place] [address]  Glucose, capillary     Status: Abnormal   Collection Time: 07/22/19  7:42 AM  Result Value Ref Range   Glucose-Capillary 119 (H) 70 - 99 mg/dL    Comment: Glucose reference range applies only to samples taken after fasting for at least 8 hours.  ECHOCARDIOGRAM COMPLETE     Status: None   Collection Time: 07/22/19 10:19 AM  Result Value Ref Range   Weight 2,856 oz   Height 71 in   BP 125/63 mmHg  Glucose, capillary     Status: Abnormal   Collection Time: 07/22/19 12:03 PM  Result Value Ref Range   Glucose-Capillary 133 (H) 70 - 99 mg/dL    Comment: Glucose reference range applies only to samples taken after fasting for at least 8 hours.  Glucose, capillary     Status: Abnormal   Collection Time: 07/22/19  4:13 PM  Result Value Ref Range    Glucose-Capillary 122 (H) 70 - 99 mg/dL    Comment: Glucose reference range applies only to samples taken after fasting for at least 8 hours.  Glucose, capillary     Status: Abnormal   Collection Time: 07/22/19  9:11 PM  Result Value Ref Range   Glucose-Capillary 111 (H) 70 - 99 mg/dL    Comment: Glucose reference range applies only to samples taken after fasting for at least 8 hours.  Basic metabolic panel     Status: Abnormal   Collection Time: 07/23/19  4:13 AM  Result Value Ref Range   Sodium 140 135 - 145 mmol/L   Potassium 3.4 (L) 3.5 - 5.1 mmol/L   Chloride 104 98 - 111 mmol/L   CO2 28 22 - 32 mmol/L   Glucose, Bld 122 (H) 70 - 99 mg/dL    Comment: Glucose reference range applies only to samples taken after fasting for at least 8 hours.   BUN 26 (H) 8 - 23 mg/dL   Creatinine, Ser 1.40 (H) 0.61 - 1.24 mg/dL   Calcium 8.5 (L) 8.9 - 10.3 mg/dL   GFR calc non Af Amer 47 (L) >60 mL/min   GFR calc Af Amer 55 (L) >60 mL/min  Anion gap 8 5 - 15    Comment: Performed at [facility], [address] [place], [place] [address]  CBC     Status: Abnormal   Collection Time: 07/23/19  4:13 AM  Result Value Ref Range   WBC 7.7 4.0 - 10.5 K/uL   RBC 4.26 4.22 - 5.81 MIL/uL   Hemoglobin 12.6 (L) 13.0 - 17.0 g/dL   HCT 38.1 (L) 39.0 - 52.0 %   MCV 89.4 80.0 - 100.0 fL   MCH 29.6 26.0 - 34.0 pg   MCHC 33.1 30.0 - 36.0 g/dL   RDW 15.9 (H) 11.5 - 15.5 %   Platelets 146 (L) 150 - 400 K/uL   nRBC 0.0 0.0 - 0.2 %    Comment: Performed at [facility], [address], [place], [place] [address]  Magnesium     Status: None   Collection Time: 07/23/19  4:13 AM  Result Value Ref Range   Magnesium 2.0 1.7 - 2.4 mg/dL    Comment: Performed at [facility], [address] [place], [place] [address]  Glucose, capillary     Status: None   Collection Time: 07/23/19  7:16 AM  Result Value Ref Range   Glucose-Capillary 94 70 - 99 mg/dL    Comment: Glucose reference  range applies only to samples taken after fasting for at least 8 hours.  Glucose, capillary     Status: Abnormal   Collection Time: 07/23/19 12:09 PM  Result Value Ref Range   Glucose-Capillary 194 (H) 70 - 99 mg/dL    Comment: Glucose reference range applies only to samples taken after fasting for at least 8 hours.    Radiology CT Head Wo Contrast  Result Date: 07/21/2019 CLINICAL DATA:  Fall, facial trauma EXAM: CT HEAD WITHOUT CONTRAST CT MAXILLOFACIAL WITHOUT CONTRAST CT CERVICAL SPINE WITHOUT CONTRAST TECHNIQUE: Multidetector CT imaging of the head, cervical spine, and maxillofacial structures were performed using the standard protocol without intravenous contrast. Multiplanar CT image reconstructions of the cervical spine and maxillofacial structures were also generated. COMPARISON:  None. FINDINGS: CT HEAD FINDINGS Brain: No evidence of acute infarction, hemorrhage, hydrocephalus, extra-axial collection or mass lesion/mass effect. Mild age related cerebral volume loss. Vascular: Atherosclerotic calcifications involving the large vessels of the skull base. No unexpected hyperdense vessel. Skull: Normal. Negative for fracture or focal lesion. Other: None. CT MAXILLOFACIAL FINDINGS Osseous: No fracture or mandibular dislocation. Bony orbital walls are intact. Maxillary sinus walls intact. No destructive process. Orbits: Negative. No traumatic or inflammatory finding. Sinuses: Paranasal sinuses and mastoid air cells are clear without mucosal thickening, opacification, or air-fluid level. Soft tissues: No focal soft tissue hematoma. CT CERVICAL SPINE FINDINGS Alignment: Physiologic alignment without static listhesis. Facet joints are aligned without dislocation. Skull base and vertebrae: No acute fracture. No primary bone lesion or focal pathologic process. The right C3-4 facet joint is fused. Soft tissues and spinal canal: No prevertebral fluid or swelling. No visible canal hematoma. Disc levels:  Multilevel intervertebral disc height loss with degenerative endplate spurring. Mild multilevel facet arthropathy. No evidence of high-grade foraminal or canal stenosis by CT. Upper chest: Visualized lung apices clear. Other: Vertebral and carotid artery atherosclerosis. IMPRESSION: 1. No acute intracranial findings. 2. No evidence of acute maxillofacial bone fracture. 3. No acute fracture or static listhesis of the cervical spine. 4. 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 Electronically Signed   By: Davina Poke D.O.   On: 07/21/2019 13:18  CT Chest Wo Contrast  Result Date: 07/21/2019 CLINICAL DATA:  Fall. EXAM: CT CHEST WITHOUT CONTRAST TECHNIQUE: Multidetector CT imaging of the chest was performed following the standard protocol without IV contrast. COMPARISON:  None FINDINGS: Cardiovascular: There is mild cardiac enlargement. Aortic atherosclerosis. Previous median sternotomy the and CABG procedure. Extensive aortic atherosclerosis. Mediastinum/Nodes: No enlarged mediastinal or axillary lymph nodes. Thyroid gland, trachea, and esophagus demonstrate no significant findings. Lungs/Pleura: Mild changes of emphysema. No airspace consolidation, atelectasis or pneumothorax. Upper Abdomen: No acute abnormality. Aortic atherosclerosis. Musculoskeletal: Previous median sternotomy. Marked thoracic kyphosis. Multi level disc space narrowing and endplate spurring identified. No acute findings IMPRESSION: 1. No acute cardiopulmonary abnormalities. 2. Emphysema and aortic atherosclerosis. 3. Marked thoracic kyphosis. Aortic Atherosclerosis (ICD10-I70.0) and Emphysema (ICD10-J43.9). Electronically Signed   By: Kerby Moors M.D.   On: 07/21/2019 15:02   CT Cervical Spine Wo Contrast  Result Date: 07/21/2019 CLINICAL DATA:  Fall, facial trauma EXAM: CT HEAD WITHOUT CONTRAST CT MAXILLOFACIAL WITHOUT CONTRAST CT CERVICAL SPINE WITHOUT CONTRAST TECHNIQUE: Multidetector CT  imaging of the head, cervical spine, and maxillofacial structures were performed using the standard protocol without intravenous contrast. Multiplanar CT image reconstructions of the cervical spine and maxillofacial structures were also generated. COMPARISON:  None. FINDINGS: CT HEAD FINDINGS Brain: No evidence of acute infarction, hemorrhage, hydrocephalus, extra-axial collection or mass lesion/mass effect. Mild age related cerebral volume loss. Vascular: Atherosclerotic calcifications involving the large vessels of the skull base. No unexpected hyperdense vessel. Skull: Normal. Negative for fracture or focal lesion. Other: None. CT MAXILLOFACIAL FINDINGS Osseous: No fracture or mandibular dislocation. Bony orbital walls are intact. Maxillary sinus walls intact. No destructive process. Orbits: Negative. No traumatic or inflammatory finding. Sinuses: Paranasal sinuses and mastoid air cells are clear without mucosal thickening, opacification, or air-fluid level. Soft tissues: No focal soft tissue hematoma. CT CERVICAL SPINE FINDINGS Alignment: Physiologic alignment without static listhesis. Facet joints are aligned without dislocation. Skull base and vertebrae: No acute fracture. No primary bone lesion or focal pathologic process. The right C3-4 facet joint is fused. Soft tissues and spinal canal: No prevertebral fluid or swelling. No visible canal hematoma. Disc levels: Multilevel intervertebral disc height loss with degenerative endplate spurring. Mild multilevel facet arthropathy. No evidence of high-grade foraminal or canal stenosis by CT. Upper chest: Visualized lung apices clear. Other: Vertebral and carotid artery atherosclerosis. IMPRESSION: 1. No acute intracranial findings. 2. No evidence of acute maxillofacial bone fracture. 3. No acute fracture or static listhesis of the cervical spine. 4. 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 Electronically Signed   By:  Davina Poke D.O.   On: 07/21/2019 13:18   DG Chest Portable 1 View  Result Date: 07/21/2019 CLINICAL DATA:  Pain. Fall. Patient tripped and fell forward on his face. Legs feel heavy. Pitting edema. EXAM: PORTABLE CHEST 1 VIEW COMPARISON:  11/05/2015 FINDINGS: Previous median sternotomy and CABG. The heart is enlarged, increased in size compared with the previous study. There is significant atherosclerotic calcification of the thoracic aorta. The lungs are clear. No focal consolidation or pulmonary edema. No acute displaced fracture. No pneumothorax. IMPRESSION: 1. Cardiomegaly. 2. No evidence for acute abnormality. Electronically Signed   By: Nolon Nations M.D.   On: 07/21/2019 13:14   VAS [place] ABI WITH/WO TBI  Result Date: 08/02/2019 LOWER EXTREMITY DOPPLER STUDY Indications: Peripheral artery disease, and Patient complaint of bilateral foot              sores(left>right) and bilateral finger  numbness.  Vascular Interventions: 09/30/06: Right SFA PTA;                         08/05/07: Left SFA stent with left popliteal artery PTA;                         11/12/2018 right TP trunk PTA                         01/05/12: Right SFA/popliteal artery PTA;                         11/01/14: Bilateral CFA, PFA & SFA                         endarterectomies/angioplasties;                         04/26/18: Left SFA/popliteal artery PTA/stent x2 with left                         TP trunk & peroneal artery PTAs;                          09/23/2018: Aortogram and Selective Right Lower                         Extremity Angiogram. PTA of the Right PFA. Comparison Study: 11/04/2018 Performing Technologist: Concha Norway RVT  Examination Guidelines: A complete evaluation includes at minimum, Doppler waveform signals and systolic blood pressure reading at the level of bilateral brachial, anterior tibial, and posterior tibial arteries, when vessel segments are accessible. Bilateral testing is considered an integral part of a  complete examination. Photoelectric Plethysmograph (PPG) waveforms and toe systolic pressure readings are included as required and additional duplex testing as needed. Limited examinations for reoccurring indications may be performed as noted.  ABI Findings: +---------+------------------+-----+----------+--------+ Right    Rt Pressure (mmHg)IndexWaveform  Comment  +---------+------------------+-----+----------+--------+ Brachial 147                                       +---------+------------------+-----+----------+--------+ ATA      96                0.64 monophasic         +---------+------------------+-----+----------+--------+ PTA      110               0.73 monophasic         +---------+------------------+-----+----------+--------+ Great Toe47                0.31 Abnormal           +---------+------------------+-----+----------+--------+ +---------+------------------+-----+----------+-------+ Left     Lt Pressure (mmHg)IndexWaveform  Comment +---------+------------------+-----+----------+-------+ Brachial 150                                      +---------+------------------+-----+----------+-------+ ATA      155               1.03 monophasic        +---------+------------------+-----+----------+-------+ PTA  153               1.02 biphasic          +---------+------------------+-----+----------+-------+ Great Toe89                0.59 Normal            +---------+------------------+-----+----------+-------+ +-------+-----------+-----------+------------+------------+ ABI/TBIToday's ABIToday's TBIPrevious ABIPrevious TBI +-------+-----------+-----------+------------+------------+ Right  .73        .31        .57         .35          +-------+-----------+-----------+------------+------------+ Left   1.03       .59        1.06        .67          +-------+-----------+-----------+------------+------------+ Right ABIs appear increased  compared to prior study on 11/04/2018.  Summary: Right: Resting right ankle-brachial index indicates moderate right lower extremity arterial disease. The right toe-brachial index is abnormal. Left: Resting left ankle-brachial index is within normal range. No evidence of significant left lower extremity arterial disease. The left toe-brachial index is abnormal.  *See table(s) above for measurements and observations.  Electronically signed by Leotis Pain MD on 08/02/2019 at 11:50:30 AM.    Final    ECHOCARDIOGRAM COMPLETE  Result Date: 07/22/2019    ECHOCARDIOGRAM REPORT   Patient Name:   Randy Howard Date of Exam: 07/22/2019 Medical Rec #:  [id number]      Height:       71.0 in Accession #:    [id number]     Weight:       178.5 lb Date of Birth:  1940-07-12      BSA:          2.009 m Patient Age:    81 years       BP:           125/63 mmHg Patient Gender: M              HR:           59 bpm. Exam Location:  [facility] Procedure: 2D Echo, Cardiac Doppler and Color Doppler Indications:     Cardiomegaly 429.3  History:         Patient has no prior history of Echocardiogram examinations.                  Risk Factors:Hypertension. PVD.  Sonographer:     Sherrie Sport RDCS (AE) Referring Phys:  [id number] Royce Macadamia AGBATA Diagnosing Phys: Bartholome Bill MD IMPRESSIONS  1. Left ventricular ejection fraction, by estimation, is 25 to 30%. The left ventricle has severely decreased function. The left ventricle has no regional wall motion abnormalities. The left ventricular internal cavity size was mildly dilated. Left ventricular diastolic parameters were normal.  2. Right ventricular systolic function is normal. The right ventricular size is mildly enlarged. There is severely elevated pulmonary artery systolic pressure.  3. Left atrial size was mildly dilated.  4. Right atrial size was mildly dilated.  5. The mitral valve is grossly normal. Mild to moderate mitral valve regurgitation.  6. Tricuspid valve regurgitation is moderate.  7. The  aortic valve is tricuspid. Aortic valve regurgitation is trivial. Mild to moderate aortic valve sclerosis/calcification is present, without any evidence of aortic stenosis. FINDINGS  Left Ventricle: Left ventricular ejection fraction, by estimation, is 25 to 30%. The left ventricle has severely decreased function. The left ventricle has no regional wall motion abnormalities.  The left ventricular internal cavity size was mildly dilated. There is no left ventricular hypertrophy. Left ventricular diastolic parameters were normal. Right Ventricle: The right ventricular size is mildly enlarged. No increase in right ventricular wall thickness. Right ventricular systolic function is normal. There is severely elevated pulmonary artery systolic pressure. The tricuspid regurgitant velocity is 3.66 m/s, and with an assumed right atrial pressure of 10 mmHg, the estimated right ventricular systolic pressure is [id number] mmHg. Left Atrium: Left atrial size was mildly dilated. Right Atrium: Right atrial size was mildly dilated. Pericardium: There is no evidence of pericardial effusion. Mitral Valve: The mitral valve is grossly normal. Mild to moderate mitral valve regurgitation. Tricuspid Valve: The tricuspid valve is grossly normal. Tricuspid valve regurgitation is moderate. Aortic Valve: The aortic valve is tricuspid. Aortic valve regurgitation is trivial. Mild to moderate aortic valve sclerosis/calcification is present, without any evidence of aortic stenosis. Aortic valve mean gradient measures 2.7 mmHg. Aortic valve peak  gradient measures 5.0 mmHg. Aortic valve area, by VTI measures 1.36 cm. Pulmonic Valve: The pulmonic valve was normal in structure. Pulmonic valve regurgitation is trivial. Aorta: The aortic root is normal in size and structure. IAS/Shunts: The interatrial septum was not assessed.  LEFT VENTRICLE PLAX 2D LVIDd:         4.99 cm LVIDs:         4.86 cm LV PW:         1.11 cm LV IVS:        0.92 cm LVOT diam:      2.00 cm LV SV:         28 LV SV Index:   14 LVOT Area:     3.14 cm  LV Volumes (MOD) LV vol d, MOD A2C: 146.0 ml LV vol d, MOD A4C: 149.0 ml LV vol s, MOD A2C: 109.0 ml LV vol s, MOD A4C: 110.0 ml LV SV MOD A2C:     37.0 ml LV SV MOD A4C:     149.0 ml LV SV MOD BP:      39.5 ml RIGHT VENTRICLE RV Basal diam:  5.23 cm RV S prime:     4.46 cm/s TAPSE (M-mode): 3.0 cm LEFT ATRIUM             Index       RIGHT ATRIUM           Index LA diam:        6.00 cm 2.99 cm/m  RA Area:     24.20 cm LA Vol (A2C):   83.9 ml 41.76 ml/m RA Volume:   84.30 ml  41.96 ml/m LA Vol (A4C):   82.3 ml 40.96 ml/m LA Biplane Vol: 83.1 ml 41.36 ml/m  AORTIC VALVE                   PULMONIC VALVE AV Area (Vmax):    1.30 cm    PV Vmax:        0.53 m/s AV Area (Vmean):   1.46 cm    PV Peak grad:   1.1 mmHg AV Area (VTI):     1.36 cm    RVOT Peak grad: 2 mmHg AV Vmax:           111.90 cm/s AV Vmean:          70.633 cm/s AV VTI:            0.206 m AV Peak Grad:      5.0 mmHg AV Mean Grad:  2.7 mmHg LVOT Vmax:         46.30 cm/s LVOT Vmean:        32.900 cm/s LVOT VTI:          0.089 m LVOT/AV VTI ratio: 0.43  AORTA Ao Root diam: 3.20 cm MITRAL VALVE               TRICUSPID VALVE MV Area (PHT): 4.12 cm    TR Peak grad:   53.6 mmHg MV Decel Time: 184 msec    TR Vmax:        366.00 cm/s MV E velocity: 78.80 cm/s                            SHUNTS                            Systemic VTI:  0.09 m                            Systemic Diam: 2.00 cm Bartholome Bill MD Electronically signed by Bartholome Bill MD Signature Date/Time: 07/22/2019/12:49:07 PM    Final    CT Maxillofacial Wo Contrast  Result Date: 07/21/2019 CLINICAL DATA:  Fall, facial trauma EXAM: CT HEAD WITHOUT CONTRAST CT MAXILLOFACIAL WITHOUT CONTRAST CT CERVICAL SPINE WITHOUT CONTRAST TECHNIQUE: Multidetector CT imaging of the head, cervical spine, and maxillofacial structures were performed using the standard protocol without intravenous contrast. Multiplanar CT image  reconstructions of the cervical spine and maxillofacial structures were also generated. COMPARISON:  None. FINDINGS: CT HEAD FINDINGS Brain: No evidence of acute infarction, hemorrhage, hydrocephalus, extra-axial collection or mass lesion/mass effect. Mild age related cerebral volume loss. Vascular: Atherosclerotic calcifications involving the large vessels of the skull base. No unexpected hyperdense vessel. Skull: Normal. Negative for fracture or focal lesion. Other: None. CT MAXILLOFACIAL FINDINGS Osseous: No fracture or mandibular dislocation. Bony orbital walls are intact. Maxillary sinus walls intact. No destructive process. Orbits: Negative. No traumatic or inflammatory finding. Sinuses: Paranasal sinuses and mastoid air cells are clear without mucosal thickening, opacification, or air-fluid level. Soft tissues: No focal soft tissue hematoma. CT CERVICAL SPINE FINDINGS Alignment: Physiologic alignment without static listhesis. Facet joints are aligned without dislocation. Skull base and vertebrae: No acute fracture. No primary bone lesion or focal pathologic process. The right C3-4 facet joint is fused. Soft tissues and spinal canal: No prevertebral fluid or swelling. No visible canal hematoma. Disc levels: Multilevel intervertebral disc height loss with degenerative endplate spurring. Mild multilevel facet arthropathy. No evidence of high-grade foraminal or canal stenosis by CT. Upper chest: Visualized lung apices clear. Other: Vertebral and carotid artery atherosclerosis. IMPRESSION: 1. No acute intracranial findings. 2. No evidence of acute maxillofacial bone fracture. 3. No acute fracture or static listhesis of the cervical spine. 4. 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 Electronically Signed   By: Davina Poke D.O.   On: 07/21/2019 13:18    Assessment/Plan  Type 2 diabetes mellitus with stage 3 chronic kidney disease (HCC) blood glucose control  important in reducing the progression of atherosclerotic disease. Also, involved in wound healing. On appropriate medications.   Hypertension blood pressure control important in reducing the progression of atherosclerotic disease. On appropriate oral medications.   Swelling of limb His swelling is gotten much worse.  He is weeping from the tissue in the calves and  this needs to be wrapped in Unna boots.  A 3 layer Unna boot was placed today and will be changed weekly.  We can reassess this in 3 to 4 weeks to see if he can come off.  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  A lymphedema pump would likely be a good adjuvant therapy to improve his leg swelling after we come out of Unna boots.  Atherosclerosis of native arteries of the extremities with ulceration ([place]) This is a very difficult problem.  He has nonhealing ulceration on the left foot but a recent ABI showed his left leg perfusion was only mildly reduced with an ABI 1 and a digit pressure in the 80s.  His right foot calluses cannot going to heal well as he has fairly poor perfusion with likely not unreconstructable vascular disease on that side.  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  He has already had open surgical therapy on both legs.  Given his age and comorbidities, he would be high risk for any further procedures.  His ABI on that side was in the 0.7 range with digit pressures in the 40s.  For now, were going to try to get the swelling out of his legs and see if that improves the situation with Unna boots.  We will continue to monitor this and may have to consider an angiogram in the future.    Leotis Pain, MD  08/12/2019 10:35 AM    This note was created with Dragon medical transcription system.  Any errors from dictation are purely  unintentional

## 2019-08-12 NOTE — Assessment & Plan Note (Signed)
This is a very difficult problem.  He has nonhealing ulceration on the left foot but a recent ABI showed his left leg perfusion was only mildly reduced with an ABI 1 and a digit pressure in the 80s.  His right foot calluses cannot going to heal well as he has fairly poor perfusion with likely not unreconstructable vascular disease on that side.  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  He has already had open surgical therapy on both legs.  Given his age and comorbidities, he would be high risk for any further procedures.  His ABI on that side was in the 0.7 range with digit pressures in the 40s.  For now, were going to try to get the swelling out of his legs and see if that improves the situation with Unna boots.  We will continue to monitor this and may have to consider an angiogram in the future.

## 2019-08-12 NOTE — Assessment & Plan Note (Signed)
blood pressure control important in reducing the progression of atherosclerotic disease. On appropriate oral medications.  

## 2019-08-12 NOTE — Patient Instructions (Signed)

## 2019-08-19 ENCOUNTER — Ambulatory Visit (INDEPENDENT_AMBULATORY_CARE_PROVIDER_SITE_OTHER): Payer: Medicare Other | Admitting: Nurse Practitioner

## 2019-08-19 ENCOUNTER — Other Ambulatory Visit: Payer: Self-pay

## 2019-08-19 ENCOUNTER — Encounter (INDEPENDENT_AMBULATORY_CARE_PROVIDER_SITE_OTHER): Payer: Self-pay

## 2019-08-19 VITALS — BP 116/74 | HR 70 | Resp 16 | Wt 166.8 lb

## 2019-08-19 DIAGNOSIS — M7989 Other specified soft tissue disorders: Secondary | ICD-10-CM

## 2019-08-19 DIAGNOSIS — R600 Localized edema: Secondary | ICD-10-CM | POA: Diagnosis not present

## 2019-08-19 NOTE — Progress Notes (Signed)
History of Present Illness  There is no documented history at this time  Assessments & Plan   There are no diagnoses linked to this encounter.    Additional instructions  Subjective:  Patient presents with venous ulcer of the Bilateral lower extremity.    Procedure:  3 layer unna wrap was placed Bilateral lower extremity.   Plan:   Follow up in one week.  

## 2019-08-23 ENCOUNTER — Encounter (INDEPENDENT_AMBULATORY_CARE_PROVIDER_SITE_OTHER): Payer: Self-pay | Admitting: Nurse Practitioner

## 2019-08-26 ENCOUNTER — Ambulatory Visit (INDEPENDENT_AMBULATORY_CARE_PROVIDER_SITE_OTHER): Payer: Medicare Other | Admitting: Nurse Practitioner

## 2019-08-26 ENCOUNTER — Other Ambulatory Visit: Payer: Self-pay

## 2019-08-26 ENCOUNTER — Encounter (INDEPENDENT_AMBULATORY_CARE_PROVIDER_SITE_OTHER): Payer: Self-pay

## 2019-08-26 VITALS — BP 112/69 | HR 75 | Resp 16

## 2019-08-26 DIAGNOSIS — M7989 Other specified soft tissue disorders: Secondary | ICD-10-CM

## 2019-08-26 DIAGNOSIS — R600 Localized edema: Secondary | ICD-10-CM | POA: Diagnosis not present

## 2019-08-26 NOTE — Progress Notes (Signed)
History of Present Illness  There is no documented history at this time  Assessments & Plan   There are no diagnoses linked to this encounter.    Additional instructions  Subjective:  Patient presents with venous ulcer of the Bilateral lower extremity.    Procedure:  3 layer unna wrap was placed Bilateral lower extremity.   Plan:   Follow up in one week.  

## 2019-08-29 ENCOUNTER — Encounter (INDEPENDENT_AMBULATORY_CARE_PROVIDER_SITE_OTHER): Payer: Self-pay | Admitting: Nurse Practitioner

## 2019-09-01 ENCOUNTER — Encounter: Payer: Medicare Other | Attending: Physician Assistant | Admitting: Physician Assistant

## 2019-09-01 ENCOUNTER — Ambulatory Visit
Admission: RE | Admit: 2019-09-01 | Discharge: 2019-09-01 | Disposition: A | Discharge status: Home / Self Care | Payer: Medicare Other | Source: Ambulatory Visit | Attending: Physician Assistant | Admitting: Physician Assistant

## 2019-09-01 ENCOUNTER — Other Ambulatory Visit: Payer: Self-pay

## 2019-09-01 ENCOUNTER — Telehealth (INDEPENDENT_AMBULATORY_CARE_PROVIDER_SITE_OTHER): Payer: Self-pay

## 2019-09-01 ENCOUNTER — Other Ambulatory Visit: Payer: Self-pay | Admitting: Physician Assistant

## 2019-09-01 DIAGNOSIS — L97522 Non-pressure chronic ulcer of other part of left foot with fat layer exposed: Secondary | ICD-10-CM | POA: Diagnosis not present

## 2019-09-01 DIAGNOSIS — E11621 Type 2 diabetes mellitus with foot ulcer: Secondary | ICD-10-CM | POA: Insufficient documentation

## 2019-09-01 DIAGNOSIS — M7989 Other specified soft tissue disorders: Secondary | ICD-10-CM | POA: Diagnosis not present

## 2019-09-01 DIAGNOSIS — L97509 Non-pressure chronic ulcer of other part of unspecified foot with unspecified severity: Secondary | ICD-10-CM | POA: Insufficient documentation

## 2019-09-01 DIAGNOSIS — E1169 Type 2 diabetes mellitus with other specified complication: Secondary | ICD-10-CM | POA: Diagnosis not present

## 2019-09-01 DIAGNOSIS — M869 Osteomyelitis, unspecified: Secondary | ICD-10-CM | POA: Insufficient documentation

## 2019-09-01 DIAGNOSIS — L97512 Non-pressure chronic ulcer of other part of right foot with fat layer exposed: Secondary | ICD-10-CM | POA: Diagnosis not present

## 2019-09-01 DIAGNOSIS — I10 Essential (primary) hypertension: Secondary | ICD-10-CM | POA: Diagnosis not present

## 2019-09-01 DIAGNOSIS — L97529 Non-pressure chronic ulcer of other part of left foot with unspecified severity: Secondary | ICD-10-CM | POA: Diagnosis not present

## 2019-09-01 NOTE — Telephone Encounter (Signed)
Absolutely fine

## 2019-09-01 NOTE — Telephone Encounter (Signed)
Patient was seen in office and they will be treating his wounds on the bottom of his both feet and his toe,  Jeri Cos at [facility] stated they will be putting in an order for his wound care to homehealth and can amend it to include the unna boots if we are okay with that. Patient will be seen tomorrow and was told to bring his supplies for his wound care to his appt in our office. I was asked if I could inform them of our decision to have homehealth do his unna wraps.

## 2019-09-01 NOTE — Progress Notes (Signed)
Randy Howard, Randy Howard ([id number]) Visit Report for 09/01/2019 Allergy List Details Patient Name: Randy Howard Date of Service: 09/01/2019 1:00 PM Medical Record Number: [id number] Patient Account Number: [id number] Date of Birth/Sex: 1940/11/19 (79 y.o. M) Treating RN: Montey Hora Primary Care Sabrine Patchen: [name] Other Clinician: Referring Haillee Johann: Referral, Self Treating Izic Stfort/Extender: STONE III, HOYT Weeks in Treatment: 0 Allergies Active Allergies oxycodone Reaction: nausea Allergy Notes Electronic Signature(s) Signed: 09/01/2019 2:59:46 PM By: Montey Hora Entered By: Montey Hora on 09/01/2019 13:13:29 Randy Howard, Randy Howard ([id number]) -------------------------------------------------------------------------------- Arrival Information Details Patient Name: Randy Howard Date of Service: 09/01/2019 1:00 PM Medical Record Number: [id number] Patient Account Number: [id number] Date of Birth/Sex: [birth year] (79 y.o. M) Treating RN: Army Melia Primary Care Angello Chien: [name] Other Clinician: Referring Kamren Heskett: Referral, Self Treating Julis Haubner/Extender: Melburn Hake, HOYT Weeks in Treatment: 0 Visit Information Patient Arrived: Cane Arrival Time: 13:05 Accompanied By: self Transfer Assistance: None Patient Identification Verified: Yes Secondary Verification Process Completed: Yes Electronic Signature(s) Signed: 09/01/2019 3:09:28 PM By: Lorine Bears RCP, RRT, CHT Entered By: Lorine Bears on 09/01/2019 13:05:37 Randy Howard, Randy Howard ([id number]) -------------------------------------------------------------------------------- Clinic Level of Care Assessment Details Patient Name: Randy Howard Date of Service: 09/01/2019 1:00 PM Medical Record Number: [id number] Patient Account Number: [id number] Date of Birth/Sex: 1940-08-14 (79 y.o. M) Treating RN: Army Melia Primary Care Issak Goley: [name] Other  Clinician: Referring Layali Freund: Referral, Self Treating Rosio Weiss/Extender: Melburn Hake, HOYT Weeks in Treatment: 0 Clinic Level of Care Assessment Items TOOL 1 Quantity Score []  - Use when EandM and Procedure is performed on INITIAL visit 0 ASSESSMENTS - Nursing Assessment / Reassessment X - General Physical Exam (combine w/ comprehensive assessment (listed just below) when performed on new 1 20 pt. evals) X- 1 25 Comprehensive Assessment (HX, ROS, Risk Assessments, Wounds Hx, etc.) ASSESSMENTS - Wound and Skin Assessment / Reassessment []  - Dermatologic / Skin Assessment (not related to wound area) 0 ASSESSMENTS - Ostomy and/or Continence Assessment and Care []  - Incontinence Assessment and Management 0 []  - 0 Ostomy Care Assessment and Management (repouching, etc.) PROCESS - Coordination of Care X - Simple Patient / Family Education for ongoing care 1 15 []  - 0 Complex (extensive) Patient / Family Education for ongoing care X- 1 10 Staff obtains Programmer, systems, Records, Test Results / Process Orders []  - 0 Staff telephones HHA, Nursing Homes / Clarify orders / etc []  - 0 Routine Transfer to another Facility (non-emergent condition) []  - 0 Routine Hospital Admission (non-emergent condition) X- 1 15 New Admissions / Biomedical engineer / Ordering NPWT, Apligraf, etc. []  - 0 Emergency Hospital Admission (emergent condition) PROCESS - Special Needs []  - Pediatric / Minor Patient Management 0 []  - 0 Isolation Patient Management []  - 0 Hearing / Language / Visual special needs []  - 0 Assessment of Community assistance (transportation, D/C planning, etc.) []  - 0 Additional assistance / Altered mentation []  - 0 Support Surface(s) Assessment (bed, cushion, seat, etc.) INTERVENTIONS - Miscellaneous []  - External ear exam 0 []  - 0 Patient Transfer (multiple staff / Civil Service fast streamer / Similar devices) []  - 0 Simple Staple / Suture removal (25 or less) []  - 0 Complex Staple / Suture  removal (26 or more) []  - 0 Hypo/Hyperglycemic Management (do not check if billed separately) []  - 0 Ankle / Brachial Index (ABI) - do not check if billed separately Has the patient been seen at the hospital within the last three years: Yes Total Score: 85 Level Of Care: New/Established - Level 3  Randy Howard, Randy Howard ([id number]) Electronic Signature(s) Signed: 09/01/2019 2:52:59 PM By: Army Melia Entered By: Army Melia on 09/01/2019 14:16:50 Randy Howard, Randy Howard ([id number]) -------------------------------------------------------------------------------- Lower Extremity Assessment Details Patient Name: Randy Howard Date of Service: 09/01/2019 1:00 PM Medical Record Number: [id number] Patient Account Number: [id number] Date of Birth/Sex: 25-Jan-1941 (79 y.o. M) Treating RN: Montey Hora Primary Care Virdell Hoiland: [name] Other Clinician: Referring Jalexus Brett: Referral, Self Treating Tianni Escamilla/Extender: STONE III, HOYT Weeks in Treatment: 0 Edema Assessment Assessed: [Left: No] [Right: No] Edema: [Left: Yes] [Right: Yes] Calf Left: Right: Point of Measurement: 34 cm From Medial Instep 40 cm 40.5 cm Ankle Left: Right: Point of Measurement: 12 cm From Medial Instep 27.5 cm 27 cm Vascular Assessment Pulses: Dorsalis Pedis Palpable: [Left:Yes] [Right:Yes] Posterior Tibial Palpable: [Left:Yes] [Right:Yes] Blood Pressure: Brachial: [Left:150] [Right:147] Dorsalis Pedis: 155 [Left:Dorsalis Pedis: 96] Ankle: Posterior Tibial: 153 [Left:Posterior Tibial: 110 1.03] [Right:0.73] Notes [facility] 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 Extremity Extremity Comorbid History: Sleep Apnea, Coronary Artery Sleep Apnea, Coronary Artery Sleep Apnea, Coronary Artery Disease, Hypertension, Peripheral Disease, Hypertension, Peripheral Disease, Hypertension, Peripheral Venous Disease, Type II Diabetes, Venous Disease, Type II Diabetes, Venous Disease, Type II Diabetes, Osteoarthritis, Neuropathy Osteoarthritis, Neuropathy Osteoarthritis, Neuropathy Date Acquired: 08/22/2017 07/03/2019 07/03/2019 Weeks of Treatment: 0 0 0 Wound Status: Open Open Open Pending Amputation on Yes Yes Yes Presentation: Measurements L x W x D (cm) 1.9x1x1.1 1x1x0.1 4.5x0.6x1 Area (cm) : 1.492 0.785 2.121 Volume (cm) : 1.641 0.079 2.121 Starting Position 1 (o'clock): 3 Ending Position 1 (o'clock): 12 Maximum Distance 1 (cm): 0.8 Undermining: Yes No No Classification: Grade 1 Grade 1 Grade 1 Exudate Amount: Medium Medium Medium Exudate Type: Purulent Serous Purulent Exudate Color: yellow, brown, green amber yellow, brown, green Wound Margin: Flat and Intact Flat and Intact Flat and Intact Granulation Amount: Large (67-100%) Small (1-33%) Small (1-33%) Granulation Quality: Pink Pink Pink Necrotic Amount: Small (1-33%) Large (67-100%) Large  (67-100%) Necrotic Tissue: Adherent Slough Eschar, Adherent Stanton Exposed Structures: Fat Layer (Subcutaneous Tissue) Fat Layer (Subcutaneous Tissue) Fat Layer (Subcutaneous Tissue) Exposed: Yes Exposed: Yes Exposed: Yes Fascia: No Fascia: No Fascia: No Tendon: No Tendon: No Tendon: No Muscle: No Muscle: No Muscle: No Joint: No Joint: No Joint: No Bone: No Bone: No Bone: No Epithelialization: Small (1-33%) None None Treatment Notes Randy Howard, Randy Howard ([id number]) Electronic Signature(s) Signed: 09/01/2019 2:52:59 PM By: Army Melia Entered By: Army Melia on 09/01/2019 14:06:05 Randy Howard, Randy Howard ([id number]) -------------------------------------------------------------------------------- [place] Details Patient Name: Randy Howard Date of Service: 09/01/2019 1:00 PM Medical Record Number: [id number] Patient Account Number: [id number] Date of Birth/Sex: 01/31/1941 (79 y.o. M) Treating RN: Army Melia Primary Care Safiya Girdler: [name] Other Clinician: Referring Sharnita Bogucki:  Referral, Self Treating Salif Tay/Extender: STONE III, HOYT Weeks in Treatment: 0 Active Inactive Nutrition Nursing Diagnoses: Impaired glucose control: actual or potential Goals: Patient/caregiver agrees to and verbalizes understanding of need to obtain nutritional consultation Date Initiated: 09/01/2019 Target Resolution Date: 09/30/2019 Goal Status: Active Interventions: Assess HgA1c results as ordered upon admission and as needed Provide education on nutrition Notes: Orientation to the Wound Care Program Nursing Diagnoses: Knowledge deficit related to the wound healing center program Goals: Patient/caregiver will verbalize understanding of the [place] Date Initiated: 09/01/2019 Target Resolution Date: 09/30/2019 Goal Status: Active Interventions: Provide education on orientation to the wound center Notes: Pain, Acute  or Chronic Nursing Diagnoses: Pain, acute or chronic: actual or potential Goals: Patient/caregiver will verbalize adequate pain control between visits Date Initiated: 09/01/2019 Target Resolution Date: 09/30/2019 Goal Status: Active Interventions: Complete pain assessment as per visit requirements Notes: Wound/Skin Impairment Nursing Diagnoses: Impaired tissue integrity Goals: Ulcer/skin breakdown will have a volume reduction of 30% by week 4 Date Initiated: 09/01/2019 Target Resolution Date: 10/01/2019 Goal Status: Active Randy Howard, Randy Howard ([id number]) Interventions: Assess ulceration(s) every visit Notes: Electronic Signature(s) Signed: 09/01/2019 2:52:59 PM By: Army Melia Entered By: Army Melia on 09/01/2019 14:05:48 Randy Howard, Randy Howard ([id number]) -------------------------------------------------------------------------------- Pain Assessment Details Patient Name: Randy Howard Date of Service: 09/01/2019 1:00 PM Medical Record Number: [id number] Patient Account Number: [id number] Date of Birth/Sex: [birth year] (79 y.o. M) Treating RN: Army Melia Primary Care Naomee Nowland: [name] Other Clinician: Referring Waseem Suess: Referral, Self Treating Glady Ouderkirk/Extender: STONE III, HOYT Weeks in Treatment: 0 Active Problems Location of Pain Severity and Description of Pain Patient Has Paino Yes Site Locations Rate the pain. Current Pain Level: 10 Pain Management and Medication Current Pain Management: Electronic Signature(s) Signed: 09/01/2019 2:52:59 PM By: Army Melia Signed: 09/01/2019 3:09:28 PM By: Lorine Bears RCP, RRT, CHT Entered By: Lorine Bears on 09/01/2019 13:06:07 Randy Howard, Randy Howard ([id number]) -------------------------------------------------------------------------------- Patient/Caregiver Education Details Patient Name: Randy Howard Date of Service: 09/01/2019 1:00 PM Medical Record Number: [id number] Patient Account  Number: [id number] Date of Birth/Gender: 10-Apr-1940 (79 y.o. M) Treating RN: Army Melia Primary Care Physician: [name] Other Clinician: Referring Physician: Referral, Self Treating Physician/Extender: Sharalyn Ink in Treatment: 0 Education Assessment Education Provided To: Patient Education Topics Provided Wound/Skin Impairment: Handouts: Caring for Your Ulcer Methods: Demonstration, Explain/Verbal Responses: State content correctly Electronic Signature(s) Signed: 09/01/2019 2:52:59 PM By: Army Melia Entered By: Army Melia on 09/01/2019 14:17:05 Randy Howard, Randy Howard ([id number]) -------------------------------------------------------------------------------- Wound Assessment Details Patient Name: Randy Howard Date of Service: 09/01/2019 1:00 PM Medical Record Number: [id number] Patient Account Number: [id number] Date of Birth/Sex: 07/16/1940 (79 y.o. M) Treating RN: Montey Hora Primary Care Vallery Mcdade: [name] Other Clinician: Referring Shemaiah Round: Referral, Self Treating Olita Takeshita/Extender: STONE III, HOYT Weeks in Treatment: 0 Wound Status Wound Number: 1 Primary Diabetic Wound/Ulcer of the Lower Extremity Etiology: Wound Location: Left, Plantar Foot Wound Open Wounding Event: Trauma Status: Date Acquired: 08/22/2017 Comorbid Sleep Apnea, Coronary Artery Disease, Hypertension, Weeks Of Treatment: 0 History: Peripheral Venous Disease, Type II Diabetes, Clustered Wound: No Osteoarthritis, Neuropathy Pending Amputation On Presentation Photos Wound Measurements Length: (cm) 1.9 Width: (cm) 1 Depth: (cm) 1.1 Area: (cm) 1.492 Volume: (cm) 1.641 % Reduction in Area: % Reduction in Volume: Epithelialization: Small (1-33%) Tunneling: No Undermining: Yes Starting Position (o'clock): 3 Ending Position (o'clock): 12 Maximum Distance: (cm) 0.8 Wound Description Classification: Grade 1 Wound Margin: Flat and Intact Exudate Amount:  Medium Exudate Type: Purulent Exudate Color: yellow, brown, green  Foul Odor After Cleansing: No Slough/Fibrino Yes Wound Bed Granulation Amount: Large (67-100%) Exposed Structure Granulation Quality: Pink Fascia Exposed: No Necrotic Amount: Small (1-33%) Fat Layer (Subcutaneous Tissue) Exposed: Yes Necrotic Quality: Adherent Slough Tendon Exposed: No Muscle Exposed: No Joint Exposed: No Bone Exposed: No Electronic Signature(s) Signed: 09/01/2019 2:59:46 PM By: Montey Hora Entered By: Montey Hora on 09/01/2019 13:48:37 Randy Howard, Randy Howard ([id number]) Schweer, Krithik W. ([id number]) -------------------------------------------------------------------------------- Wound Assessment Details Patient Name: Randy Howard Date of Service: 09/01/2019 1:00 PM Medical Record Number: [id number] Patient Account Number: [id number] Date of Birth/Sex: January 21, 1941 (79 y.o. M) Treating RN: Montey Hora Primary Care Alzina Golda: [name] Other Clinician: Referring Samaiya Awadallah: Referral, Self Treating Gracie Gupta/Extender: STONE III, HOYT Weeks in Treatment: 0 Wound Status Wound Number: 2 Primary Diabetic Wound/Ulcer of the Lower Extremity Etiology: Wound Location: Right Toe Great Wound Open Wounding Event: Gradually Appeared Status: Date Acquired: 07/03/2019 Comorbid Sleep Apnea, Coronary Artery Disease, Hypertension, Weeks Of Treatment: 0 History: Peripheral Venous Disease, Type II Diabetes, Clustered Wound: No Osteoarthritis, Neuropathy Pending Amputation On Presentation Photos Wound Measurements Length: (cm) 1 Width: (cm) 1 Depth: (cm) 0.1 Area: (cm) 0.785 Volume: (cm) 0.079 % Reduction in Area: % Reduction in Volume: Epithelialization: None Tunneling: No Undermining: No Wound Description Classification: Grade 1 Wound Margin: Flat and Intact Exudate Amount: Medium Exudate Type: Serous Exudate Color: amber Foul Odor After Cleansing: No Slough/Fibrino Yes Wound  Bed Granulation Amount: Small (1-33%) Exposed Structure Granulation Quality: Pink Fascia Exposed: No Necrotic Amount: Large (67-100%) Fat Layer (Subcutaneous Tissue) Exposed: Yes Necrotic Quality: Eschar, Adherent Slough Tendon Exposed: No Muscle Exposed: No Joint Exposed: No Bone Exposed: No Electronic Signature(s) Signed: 09/01/2019 2:59:46 PM By: Montey Hora Entered By: Montey Hora on 09/01/2019 13:49:58 Trzcinski, Randy Howard ([id number]) -------------------------------------------------------------------------------- Wound Assessment Details Patient Name: Randy Howard Date of Service: 09/01/2019 1:00 PM Medical Record Number: [id number] Patient Account Number: [id number] Date of Birth/Sex: 1940-06-10 (79 y.o. M) Treating RN: Montey Hora Primary Care Aevah Stansbery: [name] Other Clinician: Referring Jackelyne Sayer: Referral, Self Treating Mavis Gravelle/Extender: STONE III, HOYT Weeks in Treatment: 0 Wound Status Wound Number: 3 Primary Diabetic Wound/Ulcer of the Lower Extremity Etiology: Wound Location: Right, Plantar Foot Wound Open Wounding Event: Blister Status: Date Acquired: 07/03/2019 Comorbid Sleep Apnea, Coronary Artery Disease, Hypertension, Weeks Of Treatment: 0 History: Peripheral Venous Disease, Type II Diabetes, Clustered Wound: No Osteoarthritis, Neuropathy Pending Amputation On Presentation Photos Wound Measurements Length: (cm) 4.5 Width: (cm) 0.6 Depth: (cm) 1 Area: (cm) 2.121 Volume: (cm) 2.121 % Reduction in Area: % Reduction in Volume: Epithelialization: None Tunneling: No Undermining: No Wound Description Classification: Grade 1 Wound Margin: Flat and Intact Exudate Amount: Medium Exudate Type: Purulent Exudate Color: yellow, brown, green Foul Odor After Cleansing: No Slough/Fibrino Yes Wound Bed Granulation Amount: Small (1-33%) Exposed Structure Granulation Quality: Pink Fascia Exposed: No Necrotic Amount: Large  (67-100%) Fat Layer (Subcutaneous Tissue) Exposed: Yes Necrotic Quality: Eschar, Adherent Slough Tendon Exposed: No Muscle Exposed: No Joint Exposed: No Bone Exposed: No Electronic Signature(s) Signed: 09/01/2019 2:59:46 PM By: Montey Hora Entered By: Montey Hora on 09/01/2019 13:51:03 Vanacker, Randy Howard ([id number]) -------------------------------------------------------------------------------- [place] Details Patient Name: Randy Howard Date of Service: 09/01/2019 1:00 PM Medical Record Number: [id number] Patient Account Number: [id number] Date of Birth/Sex: 03-Feb-1941 (79 y.o. M) Treating RN: Army Melia Primary Care Joanie Duprey: [name] Other Clinician: Referring Hae Ahlers: Referral, Self Treating Annmarie Plemmons/Extender: STONE III, HOYT Weeks in Treatment: 0 Vital Signs Time Taken: 13:05 Temperature (F): 98.2 Height (in): 71 Pulse (bpm): 85 Source: Stated Respiratory  Rate (breaths/min): 18 Weight (lbs): 165 Blood Pressure (mmHg): 148/71 Source: Measured Reference Range: 80 - 120 mg / dl Body Mass Index (BMI): 23 Electronic Signature(s) Signed: 09/01/2019 3:09:28 PM By: Lorine Bears RCP, RRT, CHT Entered By: Becky Sax, Amado Nash on 09/01/2019 13:09:42

## 2019-09-02 ENCOUNTER — Encounter (INDEPENDENT_AMBULATORY_CARE_PROVIDER_SITE_OTHER): Payer: Medicare Other

## 2019-09-02 ENCOUNTER — Other Ambulatory Visit
Admission: RE | Admit: 2019-09-02 | Discharge: 2019-09-02 | Disposition: A | Discharge status: Home / Self Care | Payer: Medicare Other | Source: Ambulatory Visit | Attending: Physician Assistant | Admitting: Physician Assistant

## 2019-09-02 ENCOUNTER — Encounter (INDEPENDENT_AMBULATORY_CARE_PROVIDER_SITE_OTHER): Payer: Self-pay

## 2019-09-02 DIAGNOSIS — S91101A Unspecified open wound of right great toe without damage to nail, initial encounter: Secondary | ICD-10-CM | POA: Diagnosis not present

## 2019-09-02 DIAGNOSIS — L97512 Non-pressure chronic ulcer of other part of right foot with fat layer exposed: Secondary | ICD-10-CM | POA: Diagnosis not present

## 2019-09-02 DIAGNOSIS — B999 Unspecified infectious disease: Secondary | ICD-10-CM | POA: Insufficient documentation

## 2019-09-02 DIAGNOSIS — L97522 Non-pressure chronic ulcer of other part of left foot with fat layer exposed: Secondary | ICD-10-CM | POA: Diagnosis not present

## 2019-09-02 NOTE — Progress Notes (Addendum)
Randy Howard, Randy Howard ([id number]) Visit Report for 09/01/2019 Chief Complaint Document Details Patient Name: Randy Howard, Randy Howard Date of Service: 09/01/2019 1:00 PM Medical Record Number: [id number] Patient Account Number: [id number] Date of Birth/Sex: May 23, 1940 (79 y.o. M) Treating RN: Army Melia Primary Care Provider: [name] Other Clinician: Referring Provider: Referral, Self Treating Provider/Extender: Melburn Hake, Jermisha Hoffart Weeks in Treatment: 0 Information Obtained from: Patient Chief Complaint Significant bilateral foot ulcers Electronic Signature(s) Signed: 09/01/2019 2:01:14 PM By: Worthy Keeler PA-C Entered By: Worthy Keeler on 09/01/2019 14:01:14 Borrero, Mariana Kaufman ([id number]) -------------------------------------------------------------------------------- Debridement Details Patient Name: Randy Howard Date of Service: 09/01/2019 1:00 PM Medical Record Number: [id number] Patient Account Number: [id number] Date of Birth/Sex: 1940-09-04 (79 y.o. M) Treating RN: Army Melia Primary Care Provider: [name] Other Clinician: Referring Provider: Referral, Self Treating Provider/Extender: Melburn Hake, Lakena Sparlin Weeks in Treatment: 0 Debridement Performed for Wound #1 Left,Plantar Foot Assessment: Performed By: Physician STONE III, Lorene Klimas E., PA-C Debridement Type: Debridement Severity of Tissue Pre Debridement: Fat layer exposed Level of Consciousness (Pre- Awake and Alert procedure): Pre-procedure Verification/Time Out Yes - 14:05 Taken: Start Time: 14:06 Pain Control: Lidocaine Total Area Debrided (L x W): 1.9 (cm) x 1 (cm) = 1.9 (cm) Tissue and other material Viable, Non-Viable, Callus, Slough, Subcutaneous, Slough debrided: Level: Skin/Subcutaneous Tissue Debridement Description: Excisional Instrument: Curette Bleeding: Minimum Hemostasis Achieved: Pressure End Time: 14:08 Response to Treatment: Procedure was tolerated well Level of Consciousness  (Post- Awake and Alert procedure): Post Debridement Measurements of Total Wound Length: (cm) 1.9 Width: (cm) 1 Depth: (cm) 1.1 Volume: (cm) 1.641 Character of Wound/Ulcer Post Debridement: Stable Severity of Tissue Post Debridement: Fat layer exposed Post Procedure Diagnosis Same as Pre-procedure Electronic Signature(s) Signed: 09/01/2019 2:52:59 PM By: Army Melia Signed: 09/01/2019 4:30:42 PM By: Worthy Keeler PA-C Entered By: Army Melia on 09/01/2019 14:07:51 Betsch, Mariana Kaufman ([id number]) -------------------------------------------------------------------------------- Debridement Details Patient Name: Randy Howard Date of Service: 09/01/2019 1:00 PM Medical Record Number: [id number] Patient Account Number: [id number] Date of Birth/Sex: 1940/10/11 (79 y.o. M) Treating RN: Army Melia Primary Care Provider: [name] Other Clinician: Referring Provider: Referral, Self Treating Provider/Extender: STONE III, Naszir Cott Weeks in Treatment: 0 Debridement Performed for Wound #3 Right,Plantar Foot Assessment: Performed By: Physician STONE III, Roseland Braun E., PA-C Debridement Type: Debridement Severity of Tissue Pre Debridement: Fat layer exposed Level of Consciousness (Pre- Awake and Alert procedure): Pre-procedure Verification/Time Out Yes - 14:05 Taken: Start Time: 14:06 Pain Control: Lidocaine Total Area Debrided (L x W): 4.5 (cm) x 0.6 (cm) = 2.7 (cm) Tissue and other material Viable, Non-Viable, Callus, Slough, Subcutaneous, Slough debrided: Level: Skin/Subcutaneous Tissue Debridement Description: Excisional Instrument: Curette Bleeding: Minimum Hemostasis Achieved: Pressure End Time: 14:08 Response to Treatment: Procedure was tolerated well Level of Consciousness (Post- Awake and Alert procedure): Post Debridement Measurements of Total Wound Length: (cm) 4.5 Width: (cm) 0.6 Depth: (cm) 1 Volume: (cm) 2.121 Character of Wound/Ulcer Post  Debridement: Stable Severity of Tissue Post Debridement: Fat layer exposed Post Procedure Diagnosis Same as Pre-procedure Electronic Signature(s) Signed: 09/01/2019 2:52:59 PM By: Army Melia Signed: 09/01/2019 4:30:42 PM By: Worthy Keeler PA-C Entered By: Army Melia on 09/01/2019 14:10:50 Mermelstein, Mariana Kaufman ([id number]) -------------------------------------------------------------------------------- Debridement Details Patient Name: Randy Howard Date of Service: 09/01/2019 1:00 PM Medical Record Number: [id number] Patient Account Number: [id number] Date of Birth/Sex: [birth year] (79 y.o. M) Treating RN: Army Melia Primary Care Provider: [name] Other Clinician: Referring Provider: Referral, Self Treating Provider/Extender: STONE III, Yasser Hepp Weeks in Treatment: 0 Debridement Performed  for Wound #2 Right Toe Great Assessment: Performed By: Physician STONE III, Merdith Boyd E., PA-C Debridement Type: Debridement Severity of Tissue Pre Debridement: Fat layer exposed Level of Consciousness (Pre- Awake and Alert procedure): Pre-procedure Verification/Time Out Yes - 14:05 Taken: Start Time: 14:06 Pain Control: Lidocaine Total Area Debrided (L x W): 1 (cm) x 1 (cm) = 1 (cm) Tissue and other material Viable, Non-Viable, Callus, Slough, Subcutaneous, Slough debrided: Level: Skin/Subcutaneous Tissue Debridement Description: Excisional Instrument: Curette Bleeding: Minimum Hemostasis Achieved: Pressure End Time: 14:08 Response to Treatment: Procedure was tolerated well Level of Consciousness (Post- Awake and Alert procedure): Post Debridement Measurements of Total Wound Length: (cm) 1 Width: (cm) 1 Depth: (cm) 0.1 Volume: (cm) 0.079 Character of Wound/Ulcer Post Debridement: Stable Severity of Tissue Post Debridement: Fat layer exposed Post Procedure Diagnosis Same as Pre-procedure Electronic Signature(s) Signed: 09/01/2019 2:52:59 PM By: Army Melia Signed:  09/01/2019 4:30:42 PM By: Worthy Keeler PA-C Entered By: Army Melia on 09/01/2019 14:11:22 Pasquarello, Mariana Kaufman ([id number]) -------------------------------------------------------------------------------- HPI Details Patient Name: Randy Howard Date of Service: 09/01/2019 1:00 PM Medical Record Number: [id number] Patient Account Number: [id number] Date of Birth/Sex: 1940-07-06 (79 y.o. M) Treating RN: Army Melia Primary Care Provider: [name] Other Clinician: Referring Provider: Referral, Self Treating Provider/Extender: Melburn Hake, Haydn Cush Weeks in Treatment: 0 History of Present Illness HPI Description: 09/01/2019 upon evaluation today patient appears to be doing poorly upon initial inspection here in our clinic. He has been tolerating Unna boot wraps as [facility] by [facility]. With that being said unfortunately he has wounds of the bottom of his feet as well which they really have not been managing. He has been seeing Dr. Caryl Comes for his feet but he tells me that is been for about 2 years. Dr. Caryl Comes has done some x-rays but there is no documentation of osteomyelitis based on anything that we can find anyway. Unfortunately the patient has continued to have significant issues with these wounds however and he tells me at one point that he used to use his scalpel to trim some of the callus and skin off of his feet but has not been able to find it for some time. The patient does have a history of diabetes mellitus type 2 and also has peripheral vascular disease he had an angiogram on 11/12/18. His most recent ABIs on the right were 0.73 with a TBI of 0.31 on the left 1.03 with a TBI of 0.59. His most recent hemoglobin A1c on 10/26/2018 was 7.4. He does see Dr. Lazaro [facility] at [facility] vein and vascular typically. The patient also has a history of hypertension, coronary artery disease, and unfortunately has no help at home whatsoever he lives alone and is both physically as well as with  regard to his living condition in very bad shape in my opinion. I think the patient may end up needing a higher level of care at a skilled nursing facility. He may also need to be sent to the ER for further evaluation and treatment although we will have to see how things proceed. Electronic Signature(s) Signed: 09/02/2019 3:53:04 PM By: Worthy Keeler PA-C Entered By: Worthy Keeler on 09/02/2019 15:53:04 Gazzola, Mariana Kaufman ([id number]) -------------------------------------------------------------------------------- Physical Exam Details Patient Name: Randy Howard Date of Service: 09/01/2019 1:00 PM Medical Record Number: [id number] Patient Account Number: [id number] Date of Birth/Sex: 16-Apr-1940 (79 y.o. M) Treating RN: Army Melia Primary Care Provider: [name] Other Clinician: Referring Provider: Referral, Self Treating Provider/Extender: STONE III, Adel Burch Weeks in  Treatment: 0 Constitutional patient is hypertensive.. pulse regular and within target range for patient.Marland Kitchen respirations regular, non-labored and within target range for patient.Marland Kitchen temperature within target range for patient.. Well-nourished and well-hydrated in no acute distress. Eyes conjunctiva clear no eyelid edema noted. pupils equal round and reactive to light and accommodation. Respiratory normal breathing without difficulty. Cardiovascular trace posterior tibial and dorsalis pedis pulses bilateral lower extremities. no clubbing, cyanosis, significant edema, <3 sec cap refill. Musculoskeletal Patient unable to walk without assistance. no significant deformity or arthritic changes, no loss or range of motion, no clubbing. Psychiatric this patient is able to make decisions and demonstrates good insight into disease process. Alert and Oriented x 3. pleasant and cooperative. Notes Upon inspection patient's wounds actually did require some sharp debridement to clear away necrotic debris and callus from both  feet. This was absolutely necessary and I did attempt to do this as carefully as possible due to some of his somewhat poor vascular flow. He tolerated the debridement today without complication and post debridement wound bed appears to be much better at both locations but he still has significant issues here at this time. I do believe we will going to need to proceed with x-rays of both feet in order to evaluate for osteomyelitis. Electronic Signature(s) Signed: 09/02/2019 3:54:06 PM By: Worthy Keeler PA-C Entered By: Worthy Keeler on 09/02/2019 15:54:06 Kerth, Mariana Kaufman ([id number]) -------------------------------------------------------------------------------- Physician Orders Details Patient Name: Randy Howard Date of Service: 09/01/2019 1:00 PM Medical Record Number: [id number] Patient Account Number: [id number] Date of Birth/Sex: September 26, 1940 (79 y.o. M) Treating RN: Army Melia Primary Care Provider: [name] Other Clinician: Referring Provider: Referral, Self Treating Provider/Extender: Melburn Hake, Toshia Larkin Weeks in Treatment: 0 Verbal / Phone Orders: No Diagnosis Coding ICD-10 Coding Code Description E11.621 Type 2 diabetes mellitus with foot ulcer L97.522 Non-pressure chronic ulcer of other part of left foot with fat layer exposed L97.512 Non-pressure chronic ulcer of other part of right foot with fat layer exposed I10 Essential (primary) hypertension I25.10 Atherosclerotic heart disease of native coronary artery without angina pectoris I73.89 Other specified peripheral vascular diseases Wound Cleansing Wound #1 Left,Plantar Foot o Clean wound with Normal Saline. Wound #2 Right Toe Great o Clean wound with Normal Saline. Wound #3 Right,Plantar Foot o Clean wound with Normal Saline. Primary Wound Dressing Wound #1 Left,Plantar Foot o Silver Alginate Wound #2 Right Toe Great o Silver Alginate Wound #3 Right,Plantar Foot o Silver  Alginate Secondary Dressing Wound #1 Left,Plantar Foot o ABD and Kerlix/Conform Wound #2 Right Toe Great o ABD and Kerlix/Conform Wound #3 Right,Plantar Foot o ABD and Kerlix/Conform Dressing Change Frequency Wound #1 Left,Plantar Foot o Change Dressing Monday, Wednesday, Friday Wound #2 Right Toe Great o Change Dressing Monday, Wednesday, Friday Wound #3 Right,Plantar Foot o Change Dressing Monday, Wednesday, Friday Follow-up Appointments Wound #1 Left,Plantar Foot o Return Appointment in 1 week. - Monday Wound #2 Right [address] [place]. ([id number]) o Return Appointment in 1 week. - Monday Wound #3 Right,Plantar Foot o Return Appointment in 1 week. - Monday Edema Control o Unna Boots Bilaterally - ABD pads along the shin for protection Home Health Wound #1 [place] for [place] Nurse may visit PRN to address patientos wound care needs. o FACE TO FACE ENCOUNTER: MEDICARE and MEDICAID PATIENTS: I certify that this patient is under my care and that I had a face-to-face encounter that meets the physician face-to-face encounter requirements with this  patient on this date. The encounter with the patient was in whole or in part for the following MEDICAL CONDITION: (primary reason for [place]) MEDICAL NECESSITY: I certify, that based on my findings, NURSING services are a medically necessary home health service. HOME BOUND STATUS: I certify that my clinical findings support that this patient is homebound (i.e., Due to illness or injury, pt requires aid of supportive devices such as crutches, cane, wheelchairs, walkers, the use of special transportation or the assistance of another person to leave their place of residence. There is a normal inability to leave the home and doing so requires considerable and taxing effort. Other absences are for medical reasons / religious services and are  infrequent or of short duration when for other reasons). o If current dressing causes regression in wound condition, may D/C ordered dressing product/s and apply Normal Saline Moist Dressing daily until next [place] / Other MD appointment. [place] of regression in wound condition at [phone]. o Please direct any NON-WOUND related issues/requests for orders to patient's Primary Care Physician Wound #2 Right Toe [place] for [place] Nurse may visit PRN to address patientos wound care needs. o FACE TO FACE ENCOUNTER: MEDICARE and MEDICAID PATIENTS: I certify that this patient is under my care and that I had a face-to-face encounter that meets the physician face-to-face encounter requirements with this patient on this date. The encounter with the patient was in whole or in part for the following MEDICAL CONDITION: (primary reason for [place]) MEDICAL NECESSITY: I certify, that based on my findings, NURSING services are a medically necessary home health service. HOME BOUND STATUS: I certify that my clinical findings support that this patient is homebound (i.e., Due to illness or injury, pt requires aid of supportive devices such as crutches, cane, wheelchairs, walkers, the use of special transportation or the assistance of another person to leave their place of residence. There is a normal inability to leave the home and doing so requires considerable and taxing effort. Other absences are for medical reasons / religious services and are infrequent or of short duration when for other reasons). o If current dressing causes regression in wound condition, may D/C ordered dressing product/s and apply Normal Saline Moist Dressing daily until next [place] / Other MD appointment. [place] of regression in wound condition at [phone]. o Please direct any NON-WOUND related  issues/requests for orders to patient's Primary Care Physician Wound #3 [place] for [place] Nurse may visit PRN to address patientos wound care needs. o FACE TO FACE ENCOUNTER: MEDICARE and MEDICAID PATIENTS: I certify that this patient is under my care and that I had a face-to-face encounter that meets the physician face-to-face encounter requirements with this patient on this date. The encounter with the patient was in whole or in part for the following MEDICAL CONDITION: (primary reason for [place]) MEDICAL NECESSITY: I certify, that based on my findings, NURSING services are a medically necessary home health service. HOME BOUND STATUS: I certify that my clinical findings support that this patient is homebound (i.e., Due to illness or injury, pt requires aid of supportive devices such as crutches, cane, wheelchairs, walkers, the use of special transportation or the assistance of another person to leave their place of residence. There is a normal inability to leave the home and doing so requires considerable and taxing effort. Other  absences are for medical reasons / religious services and are infrequent or of short duration when for other reasons). o If current dressing causes regression in wound condition, may D/C ordered dressing product/s and apply Normal Saline Moist Dressing daily until next [place] / Other MD appointment. [place] of regression in wound condition at [phone]. o Please direct any NON-WOUND related issues/requests for orders to patient's Primary Care Physician Laboratory o Bacteria identified in Wound by Culture (MICRO) - Right foot oooo LOINC Code: 5621-3 oooo Convenience Name: Wound culture routine Radiology o X-ray, foot - left foot and right foot Electronic Signature(s) Signed: 09/01/2019 4:30:42 PM By: Cammie Mcgee, Hakeem W.  ([id number]) Signed: 09/02/2019 3:19:07 PM By: Army Melia Previous Signature: 09/01/2019 2:52:59 PM Version By: Army Melia Entered By: Army Melia on 09/01/2019 15:21:09 Hum, Mariana Kaufman ([id number]) -------------------------------------------------------------------------------- Problem List Details Patient Name: Randy Howard Date of Service: 09/01/2019 1:00 PM Medical Record Number: [id number] Patient Account Number: [id number] Date of Birth/Sex: [birth year] (79 y.o. M) Treating RN: Army Melia Primary Care Provider: [name] Other Clinician: Referring Provider: Referral, Self Treating Provider/Extender: Melburn Hake, Jakolby Sedivy Weeks in Treatment: 0 Active Problems ICD-10 Encounter Code Description Active Date MDM Diagnosis E11.621 Type 2 diabetes mellitus with foot ulcer 09/01/2019 No Yes L97.522 Non-pressure chronic ulcer of other part of left foot with fat layer 09/01/2019 No Yes exposed L97.512 Non-pressure chronic ulcer of other part of right foot with fat layer 09/01/2019 No Yes exposed [place] (primary) hypertension 09/01/2019 No Yes I25.10 Atherosclerotic heart disease of native coronary artery without angina 09/01/2019 No Yes pectoris I73.89 Other specified peripheral vascular diseases 09/01/2019 No Yes Inactive Problems Resolved Problems Electronic Signature(s) Signed: 09/01/2019 2:00:53 PM By: Worthy Keeler PA-C Entered By: Worthy Keeler on 09/01/2019 14:00:53 [place], Mariana Kaufman ([id number]) -------------------------------------------------------------------------------- Progress Note Details Patient Name: Randy Howard Date of Service: 09/01/2019 1:00 PM Medical Record Number: [id number] Patient Account Number: [id number] Date of Birth/Sex: 1940/06/10 (79 y.o. M) Treating RN: Army Melia Primary Care Provider: [name] Other Clinician: Referring Provider: Referral, Self Treating Provider/Extender: Melburn Hake, Jondavid Schreier Weeks in Treatment:  0 Subjective Chief Complaint Information obtained from Patient Significant bilateral foot ulcers History of Present Illness (HPI) 09/01/2019 upon evaluation today patient appears to be doing poorly upon initial inspection here in our clinic. He has been tolerating Unna boot wraps as [facility] by [facility]. With that being said unfortunately he has wounds of the bottom of his feet as well which they really have not been managing. He has been seeing Dr. Caryl Comes for his feet but he tells me that is been for about 2 years. Dr. Caryl Comes has done some x-rays but there is no documentation of osteomyelitis based on anything that we can find anyway. Unfortunately the patient has continued to have significant issues with these wounds however and he tells me at one point that he used to use his scalpel to trim some of the callus and skin off of his feet but has not been able to find it for some time. The patient does have a history of diabetes mellitus type 2 and also has peripheral vascular disease he had an angiogram on 11/12/18. His most recent ABIs on the right were 0.73 with a TBI of 0.31 on the left 1.03 with a TBI of 0.59. His most recent hemoglobin A1c on 10/26/2018 was 7.4. He does see Dr. Lazaro Arms at [facility] vein and vascular typically. The patient also has  a history of hypertension, coronary artery disease, and unfortunately has no help at home whatsoever he lives alone and is both physically as well as with regard to his living condition in very bad shape in my opinion. I think the patient may end up needing a higher level of care at a skilled nursing facility. He may also need to be sent to the ER for further evaluation and treatment although we will have to see how things proceed. Patient History Information obtained from Patient. Allergies oxycodone (Reaction: nausea) Family History Diabetes - Siblings,Father, Heart Disease - Father,Siblings, Hypertension - Siblings,Father, No family history  of Cancer, Hereditary Spherocytosis, Kidney Disease, Lung Disease, Seizures, Stroke, Thyroid Problems, Tuberculosis. Social History Former smoker - 1985, Marital Status - Widowed, Alcohol Use - Never, Drug Use - No History, Caffeine Use - Daily. Medical History Ear/Nose/Mouth/Throat Denies history of Chronic sinus problems/congestion, Middle ear problems Respiratory Patient has history of Sleep Apnea Denies history of Aspiration, Asthma, Chronic Obstructive Pulmonary Disease (COPD), Pneumothorax, Tuberculosis Cardiovascular Patient has history of Coronary Artery Disease, Hypertension, Peripheral Venous Disease Denies history of Angina, Arrhythmia, Congestive Heart Failure, Deep Vein Thrombosis, Hypotension, Myocardial Infarction, Peripheral Arterial Disease, Phlebitis, Vasculitis Endocrine Patient has history of Type II Diabetes Denies history of Type I Diabetes Integumentary (Skin) Denies history of History of Burn, History of pressure wounds Musculoskeletal Patient has history of Osteoarthritis Denies history of Gout, Rheumatoid Arthritis, Osteomyelitis Neurologic Patient has history of Neuropathy Denies history of Dementia, Quadriplegia, Paraplegia, Seizure Disorder Oncologic Denies history of Received Chemotherapy, Received Radiation Patient is treated with Oral Agents. Medical And Surgical History Notes Ear/Nose/Mouth/Throat hit mouth and broke plates Cardiovascular HLD Oncologic skin cancer VYNCENT, OVERBY. ([id number]) Review of Systems (ROS) Constitutional Symptoms (General Health) Denies complaints or symptoms of Fatigue, Fever, Chills, Marked Weight Change. Eyes Denies complaints or symptoms of Dry Eyes, Vision Changes, Glasses / Contacts. Ear/Nose/Mouth/Throat Denies complaints or symptoms of Difficult clearing ears, Sinusitis. Hematologic/Lymphatic Denies complaints or symptoms of Bleeding / Clotting Disorders, Human Immunodeficiency Virus. Respiratory Denies  complaints or symptoms of Chronic or frequent coughs, Shortness of Breath. Cardiovascular Complains or has symptoms of LE edema. Denies complaints or symptoms of Chest pain. Gastrointestinal Denies complaints or symptoms of Frequent diarrhea, Nausea, Vomiting. Endocrine Denies complaints or symptoms of Hepatitis, Thyroid disease, Polydypsia (Excessive Thirst). Genitourinary Denies complaints or symptoms of Kidney failure/ Dialysis, Incontinence/dribbling. Immunological Denies complaints or symptoms of Hives, Itching. Integumentary (Skin) Complains or has symptoms of Wounds, Swelling. Denies complaints or symptoms of Bleeding or bruising tendency, Breakdown. Musculoskeletal Denies complaints or symptoms of Muscle Pain, Muscle Weakness. Neurologic Denies complaints or symptoms of Numbness/parasthesias, Focal/Weakness. Psychiatric Denies complaints or symptoms of Anxiety, Claustrophobia. Objective Constitutional patient is hypertensive.. pulse regular and within target range for patient.Marland Kitchen respirations regular, non-labored and within target range for patient.Marland Kitchen temperature within target range for patient.. Well-nourished and well-hydrated in no acute distress. Vitals Time Taken: 1:05 PM, Height: 71 in, Source: Stated, Weight: 165 lbs, Source: Measured, BMI: 23, Temperature: 98.2 F, Pulse: 85 bpm, Respiratory Rate: 18 breaths/min, Blood Pressure: 148/71 mmHg. Eyes conjunctiva clear no eyelid edema noted. pupils equal round and reactive to light and accommodation. Respiratory normal breathing without difficulty. Cardiovascular trace posterior tibial and dorsalis pedis pulses bilateral lower extremities. no clubbing, cyanosis, significant edema, Musculoskeletal Patient unable to walk without assistance. no significant deformity or arthritic changes, no loss or range of motion, no clubbing. Psychiatric this patient is able to make decisions and demonstrates good insight into disease  process.  Alert and Oriented x 3. pleasant and cooperative. General Notes: Upon inspection patient's wounds actually did require some sharp debridement to clear away necrotic debris and callus from both feet. This was absolutely necessary and I did attempt to do this as carefully as possible due to some of his somewhat poor vascular flow. He tolerated the debridement today without complication and post debridement wound bed appears to be much better at both locations but he still has significant issues here at this time. I do believe we will going to need to proceed with x-rays of both feet in order to evaluate for osteomyelitis. Integumentary (Hair, Skin) Wound #1 status is Open. Original cause of wound was Trauma. The wound is located on the [place]. The wound measures 1.9cm length x 1cm width x 1.1cm depth; 1.492cm^2 area and 1.641cm^3 volume. There is Fat Layer (Subcutaneous Tissue) Exposed exposed. There is no tunneling noted, however, there is undermining starting at 3:00 and ending at 12:00 with a maximum distance of 0.8cm. There is a medium amount of purulent drainage noted. The wound margin is flat and intact. There is large (67-100%) pink granulation within the wound bed. There is a small (1-33%) amount of necrotic tissue within the wound bed including Adherent Slough. SENECA, HOBACK ([id number]) Wound #2 status is Open. Original cause of wound was Gradually Appeared. The wound is located on the Right Toe Great. The wound measures 1cm length x 1cm width x 0.1cm depth; 0.785cm^2 area and 0.079cm^3 volume. There is Fat Layer (Subcutaneous Tissue) Exposed exposed. There is no tunneling or undermining noted. There is a medium amount of serous drainage noted. The wound margin is flat and intact. There is small (1-33%) pink granulation within the wound bed. There is a large (67-100%) amount of necrotic tissue within the wound bed including Eschar and Adherent Slough. Wound #3 status  is Open. Original cause of wound was Blister. The wound is located on the [place]. The wound measures 4.5cm length x 0.6cm width x 1cm depth; 2.121cm^2 area and 2.121cm^3 volume. There is Fat Layer (Subcutaneous Tissue) Exposed exposed. There is no tunneling or undermining noted. There is a medium amount of purulent drainage noted. The wound margin is flat and intact. There is small (1-33%) pink granulation within the wound bed. There is a large (67-100%) amount of necrotic tissue within the wound bed including Eschar and Adherent Slough. 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 There was a Excisional Skin/Subcutaneous Tissue Debridement with a total area of 1.9 sq cm performed by STONE III, Loriana Samad E., PA-C. With the following instrument(s): Curette to remove Viable and Non-Viable tissue/material. Material removed includes Callus, Subcutaneous Tissue, and Slough after achieving pain control using Lidocaine. A time out was conducted at 14:05, prior to the start of the procedure. A Minimum amount of bleeding was controlled with Pressure. The procedure was tolerated well. Post Debridement Measurements: 1.9cm length x 1cm width x 1.1cm depth; 1.641cm^3 volume. Character of Wound/Ulcer Post Debridement is stable. Severity of Tissue Post Debridement is: Fat layer exposed. Post  procedure Diagnosis Wound #1: Same as Pre-Procedure Wound #2 Pre-procedure  diagnosis of Wound #2 is a Diabetic Wound/Ulcer of the Lower Extremity located on the Right Toe Great .Severity of Tissue Pre Debridement is: Fat layer exposed. There was a Excisional Skin/Subcutaneous Tissue Debridement with a total area of 1 sq cm performed by STONE III, Yaphet Smethurst E., PA-C. With the following instrument(s): Curette to remove Viable and Non-Viable tissue/material. Material removed includes Callus, Subcutaneous Tissue, and Slough after achieving pain control using Lidocaine. A time out was conducted at 14:05, prior to the start of the procedure. A Minimum amount of bleeding was controlled with Pressure. The procedure was tolerated well. Post Debridement Measurements: 1cm length x 1cm width x 0.1cm depth; 0.079cm^3 volume. Character of Wound/Ulcer Post Debridement is stable. Severity of Tissue Post Debridement is: Fat layer exposed. Post procedure Diagnosis Wound #2: Same as Pre-Procedure Wound #3 Pre-procedure diagnosis of Wound #3 is a Diabetic Wound/Ulcer of the Lower Extremity located on the Right,Plantar Foot .Severity of Tissue Pre Debridement is: Fat layer exposed. There was a Excisional Skin/Subcutaneous Tissue Debridement with a total area of 2.7 sq cm performed by STONE III, Cyleigh Massaro E., PA-C. With the following instrument(s): Curette to remove Viable and Non-Viable tissue/material. Material removed includes Callus, Subcutaneous Tissue, and Slough after achieving pain control using Lidocaine. A time out was conducted at 14:05, prior to the start of the procedure. A Minimum amount of bleeding was controlled with Pressure. The procedure was tolerated well. Post Debridement Measurements: 4.5cm length x 0.6cm width x 1cm depth; 2.121cm^3 volume. Character of Wound/Ulcer Post Debridement is stable. Severity of Tissue Post Debridement is: Fat layer exposed. Post procedure Diagnosis Wound #3: Same as Pre-Procedure Plan Wound Cleansing: KLEVER, TWYFORD. ([id number]) Wound #1 Left,Plantar  Foot: Clean wound with Normal Saline. Wound #2 Right Toe Great: Clean wound with Normal Saline. Wound #3 Right,Plantar Foot: Clean wound with Normal Saline. Primary Wound Dressing: Wound #1 Left,Plantar Foot: Silver Alginate Wound #2 Right Toe Great: Silver Alginate Wound #3 Right,Plantar Foot: Silver Alginate Secondary Dressing: Wound #1 Left,Plantar Foot: ABD and Kerlix/Conform Wound #2 Right Toe Great: ABD and Kerlix/Conform Wound #3 Right,Plantar Foot: ABD and Kerlix/Conform Dressing Change Frequency: Wound #1 Left,Plantar Foot: Change Dressing Monday, Wednesday, Friday Wound #2 Right Toe Great: Change Dressing Monday, Wednesday, Friday Wound #3 Right,Plantar Foot: Change Dressing Monday, Wednesday, Friday Follow-up Appointments: Wound #1 Left,Plantar Foot: Return Appointment in 1 week. - Monday Wound #2 Right Toe Great: Return Appointment in 1 week. - Monday Wound #3 Right,Plantar Foot: Return Appointment in 1 week. - Monday Edema Control: Unna Boots Bilaterally - ABD pads along the shin for protection Home Health: Wound #1 Left,Plantar Foot: [place] for [place] Nurse may visit PRN to address patient s wound care needs. FACE TO FACE ENCOUNTER: MEDICARE and MEDICAID PATIENTS: I certify that this patient is under my care and that I had a face-to-face encounter that meets the physician face-to-face encounter requirements with this patient on this date. The encounter with the patient was in whole or in part for the following MEDICAL CONDITION: (primary reason for [place]) MEDICAL NECESSITY: I certify, that based on my findings, NURSING services are a medically necessary home health service. HOME BOUND STATUS: I certify that my clinical findings support that this patient is homebound (i.e., Due to illness or injury, pt requires aid of supportive devices such as crutches, cane, wheelchairs, walkers, the use of special transportation  or the assistance of another person to  leave their place of residence. There is a normal inability to leave the home and doing so requires considerable and taxing effort. Other absences are for medical reasons / religious services and are infrequent or of short duration when for other reasons). If current dressing causes regression in wound condition, may D/C ordered dressing product/s and apply Normal Saline Moist Dressing daily until next [place] / Other MD appointment. [place] of regression in wound condition at [phone]. Please direct any NON-WOUND related issues/requests for orders to patient's Primary Care Physician Wound #2 Right Toe Great: [place] for [place] Nurse may visit PRN to address patient s wound care needs. FACE TO FACE ENCOUNTER: MEDICARE and MEDICAID PATIENTS: I certify that this patient is under my care and that I had a face-to-face encounter that meets the physician face-to-face encounter requirements with this patient on this date. The encounter with the patient was in whole or in part for the following MEDICAL CONDITION: (primary reason for [place]) MEDICAL NECESSITY: I certify, that based on my findings, NURSING services are a medically necessary home health service. HOME BOUND STATUS: I certify that my clinical findings support that this patient is homebound (i.e., Due to illness or injury, pt requires aid of supportive devices such as crutches, cane, wheelchairs, walkers, the use of special transportation or the assistance of another person to leave their place of residence. There is a normal inability to leave the home and doing so requires considerable and taxing effort. Other absences are for medical reasons / religious services and are infrequent or of short duration when for other reasons). If current dressing causes regression in wound condition, may D/C ordered dressing product/s and apply  Normal Saline Moist Dressing daily until next [place] / Other MD appointment. [place] of regression in wound condition at [phone]. Please direct any NON-WOUND related issues/requests for orders to patient's Primary Care Physician Wound #3 Right,Plantar Foot: [place] for [place] Nurse may visit PRN to address patient s wound care needs. FACE TO FACE ENCOUNTER: MEDICARE and MEDICAID PATIENTS: I certify that this patient is under my care and that I had a face-to-face encounter that meets the physician face-to-face encounter requirements with this patient on this date. The encounter with the patient was in whole or in part for the following MEDICAL CONDITION: (primary reason for [place]) MEDICAL NECESSITY: I certify, that based on my findings, NURSING services are a medically necessary home health service. HOME BOUND STATUS: I certify that my clinical findings support that this patient is homebound (i.e., Due to illness or injury, pt requires aid of supportive devices such as crutches, cane, wheelchairs, walkers, the use of special transportation or the assistance of another person to leave their place of residence. There is a normal inability to leave the home and doing so requires considerable and taxing effort. Other absences are for medical reasons / religious services and are infrequent or of short duration when for other reasons). If current dressing causes regression in wound condition, may D/C ordered dressing product/s and apply Normal Saline Moist Dressing daily until next [place] / Other MD appointment. [place] of regression in wound condition at [phone]. Please direct any NON-WOUND related issues/requests for orders to patient's Primary Care Physician Radiology ordered were: LILTON, PARE ([id number]) X-ray, foot - left foot and right foot Laboratory ordered were: Wound  culture routine - Right foot  1. My suggestion at this time is good be that we go ahead and initiate treatment with a silver alginate dressing to all wound locations. 2. We will cover this with an ABD pad and roll gauze at both sites. 3. With regard to the Unna boot wraps he gets these typically at the vascular office I did want to check with him as to whether they want to continue to wrap him or try to have home health do this they did advise me that they are okay with home health doing this. 4. With regard to x-rays I did place an order for that today for both feet on depend on the results of the x-rays we may need to see about more aggressive treatment of therapy including potential for IV antibiotic therapy he may even need to go to the hospital in fact to get things initiated and started here. We will see patient back for reevaluation in 1 week here in the clinic. If anything worsens or changes patient will contact our office for additional recommendations. Electronic Signature(s) Signed: 09/02/2019 3:55:28 PM By: Worthy Keeler PA-C Entered By: Worthy Keeler on 09/02/2019 15:55:28 [place], Mariana Kaufman ([id number]) -------------------------------------------------------------------------------- ROS/PFSH Details Patient Name: Randy Howard Date of Service: 09/01/2019 1:00 PM Medical Record Number: [id number] Patient Account Number: [id number] Date of Birth/Sex: [birth year] (79 y.o. M) Treating RN: Montey Hora Primary Care Provider: [name] Other Clinician: Referring Provider: Referral, Self Treating Provider/Extender: STONE III, Braylei Totino Weeks in Treatment: 0 Information Obtained From Patient Constitutional Symptoms (General Health) Complaints and Symptoms: Negative for: Fatigue; Fever; Chills; Marked Weight Change Eyes Complaints and Symptoms: Negative for: Dry Eyes; Vision Changes; Glasses / Contacts Ear/Nose/Mouth/Throat Complaints and Symptoms: Negative for:  Difficult clearing ears; Sinusitis Medical History: Negative for: Chronic sinus problems/congestion; Middle ear problems Past Medical History Notes: hit mouth and broke plates Hematologic/Lymphatic Complaints and Symptoms: Negative for: Bleeding / Clotting Disorders; Human Immunodeficiency Virus Respiratory Complaints and Symptoms: Negative for: Chronic or frequent coughs; Shortness of Breath Medical History: Positive for: Sleep Apnea Negative for: Aspiration; Asthma; Chronic Obstructive Pulmonary Disease (COPD); Pneumothorax; Tuberculosis Cardiovascular Complaints and Symptoms: Positive for: LE edema Negative for: Chest pain Medical History: Positive for: Coronary Artery Disease; Hypertension; Peripheral Venous Disease Negative for: Angina; Arrhythmia; Congestive Heart Failure; Deep Vein Thrombosis; Hypotension; Myocardial Infarction; Peripheral Arterial Disease; Phlebitis; Vasculitis Past Medical History Notes: HLD Gastrointestinal Complaints and Symptoms: Negative for: Frequent diarrhea; Nausea; Vomiting Endocrine Complaints and Symptoms: Negative for: Hepatitis; Thyroid disease; Polydypsia (Excessive Thirst) Medical History: RICCI, DIROCCO ([id number]) Positive for: Type II Diabetes Negative for: Type I Diabetes Treated with: Oral agents Genitourinary Complaints and Symptoms: Negative for: Kidney failure/ Dialysis; Incontinence/dribbling Immunological Complaints and Symptoms: Negative for: Hives; Itching Integumentary (Skin) Complaints and Symptoms: Positive for: Wounds; Swelling Negative for: Bleeding or bruising tendency; Breakdown Medical History: Negative for: History of Burn; History of pressure wounds Musculoskeletal Complaints and Symptoms: Negative for: Muscle Pain; Muscle Weakness Medical History: Positive for: Osteoarthritis Negative for: Gout; Rheumatoid Arthritis; Osteomyelitis Neurologic Complaints and Symptoms: Negative for:  Numbness/parasthesias; Focal/Weakness Medical History: Positive for: Neuropathy Negative for: Dementia; Quadriplegia; Paraplegia; Seizure Disorder Psychiatric Complaints and Symptoms: Negative for: Anxiety; Claustrophobia Oncologic Medical History: Negative for: Received Chemotherapy; Received Radiation Past Medical History Notes: skin cancer Immunizations Pneumococcal Vaccine: Received Pneumococcal Vaccination: Yes Implantable Devices None Family and Social History Cancer: No; Diabetes: Yes - Siblings,Father; Heart Disease: Yes - Father,Siblings; Hereditary Spherocytosis: No; Hypertension: Yes - Siblings,Father; Kidney Disease: No; Lung Disease: No; Seizures: No; Stroke:  No; Thyroid Problems: No; Tuberculosis: No; Former smoker - 1985; Marital Status - Widowed; Alcohol Use: Never; Drug Use: No History; Caffeine Use: Daily Electronic Signature(s) BOND, GRIESHOP ([id number]) Signed: 09/01/2019 2:59:46 PM By: Montey Hora Signed: 09/01/2019 4:30:42 PM By: Worthy Keeler PA-C Entered By: Montey Hora on 09/01/2019 13:36:10 [place]aff, Mariana Kaufman ([id number]) -------------------------------------------------------------------------------- SuperBill Details Patient Name: Randy Howard Date of Service: 09/01/2019 Medical Record Number: [id number] Patient Account Number: [id number] Date of Birth/Sex: Nov 26, 1940 (79 y.o. M) Treating RN: Army Melia Primary Care Provider: [name] Other Clinician: Referring Provider: Referral, Self Treating Provider/Extender: [place] Hake, Ilhan Debenedetto Weeks in Treatment: 0 Diagnosis Coding ICD-10 Codes Code Description E11.621 Type 2 diabetes mellitus with foot ulcer L97.522 Non-pressure chronic ulcer of other part of left foot with fat layer exposed L97.512 Non-pressure chronic ulcer of other part of right foot with fat layer exposed I10 Essential (primary) hypertension I25.10 Atherosclerotic heart disease of native coronary artery without  angina pectoris I73.89 Other specified peripheral vascular diseases Facility Procedures CPT4 Code: 59747185 Description: 99213 - WOUND CARE VISIT-LEV 3 EST PT Modifier: Quantity: 1 CPT4 Code: 50158682 Description: 11042 - DEB SUBQ TISSUE 20 SQ CM/< Modifier: Quantity: 1 CPT4 Code: Description: ICD-10 Diagnosis Description L97.522 Non-pressure chronic ulcer of other part of left foot with fat layer exp L97.512 Non-pressure chronic ulcer of other part of right foot with fat layer ex Modifier: osed posed Quantity: Physician Procedures CPT4 Code: 5749355 Description: 21747 - WC PHYS LEVEL 4 - NEW PT Modifier: 25 Quantity: 1 CPT4 Code: Description: ICD-10 Diagnosis Description E11.621 Type 2 diabetes mellitus with foot ulcer L97.522 Non-pressure chronic ulcer of other part of left foot with fat layer exp L97.512 Non-pressure chronic ulcer of other part of right foot with fat layer ex  I10 Essential (primary) hypertension Modifier: osed posed Quantity: CPT4 Code: 1595396 Description: 11042 - WC PHYS SUBQ TISS 20 SQ CM Modifier: Quantity: 1 CPT4 Code: Description: ICD-10 Diagnosis Description L97.522 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

## 2019-09-02 NOTE — Telephone Encounter (Signed)
I contacted [facility] office and let them know Ronette Deter answer to their recommendation. See notes below.

## 2019-09-03 ENCOUNTER — Other Ambulatory Visit: Payer: Self-pay | Admitting: Family Medicine

## 2019-09-03 DIAGNOSIS — M159 Polyosteoarthritis, unspecified: Secondary | ICD-10-CM

## 2019-09-03 DIAGNOSIS — R351 Nocturia: Secondary | ICD-10-CM

## 2019-09-03 DIAGNOSIS — N401 Enlarged prostate with lower urinary tract symptoms: Secondary | ICD-10-CM

## 2019-09-03 DIAGNOSIS — E1142 Type 2 diabetes mellitus with diabetic polyneuropathy: Secondary | ICD-10-CM

## 2019-09-03 NOTE — Telephone Encounter (Signed)
Requested medication (s) are due for refill today: yes  Requested medication (s) are on the active medication list: yes  Last refill:  07/23/19  Future visit scheduled: yes  Notes to clinic:  med not delegated to NT to RF   Requested Prescriptions  Pending Prescriptions Disp Refills   baclofen (LIORESAL) 10 MG tablet [Pharmacy Med Name: BACLOFEN 10 MG TAB] 90 each     Sig: TAKE 1 TABLET BY MOUTH 3 TIMES DAILY AS NEEDED      Not Delegated - Analgesics:  Muscle Relaxants Failed - 09/03/2019  8:50 AM      Failed - This refill cannot be delegated      Failed - Valid encounter within last 6 months    Recent Outpatient Visits           2 months ago Peripheral vascular disease ([place])   [facility], Devonne Doughty, DO   10 months ago Annual physical exam   [place], Devonne Doughty, DO   1 year ago Type 2 diabetes mellitus with diabetic polyneuropathy, without long-term current use of insulin ([place])   [facility], Devonne Doughty, DO   2 years ago Type 2 diabetes mellitus with diabetic polyneuropathy, without long-term current use of insulin ([place])   [facility], Devonne Doughty, DO   2 years ago Type 2 diabetes mellitus with diabetic polyneuropathy, without long-term current use of insulin ([place])   [facility], Devonne Doughty, DO               Signed Prescriptions Disp Refills   glimepiride (AMARYL) 4 MG tablet 90 tablet 0    Sig: TAKE 1 TABLET BY MOUTH ONCE EVERY MORNING      Endocrinology:  Diabetes - Sulfonylureas Failed - 09/03/2019  8:50 AM      Failed - Valid encounter within last 6 months    Recent Outpatient Visits           2 months ago Peripheral vascular disease ([place])   [facility], Devonne Doughty, DO   10 months ago Annual physical exam   [place], Devonne Doughty, DO   1 year ago Type 2  diabetes mellitus with diabetic polyneuropathy, without long-term current use of insulin ([place])   [facility], Devonne Doughty, DO   2 years ago Type 2 diabetes mellitus with diabetic polyneuropathy, without long-term current use of insulin ([place])   [facility], Devonne Doughty, DO   2 years ago Type 2 diabetes mellitus with diabetic polyneuropathy, without long-term current use of insulin ([place])   [facility], Devonne Doughty, DO              Passed - HBA1C is between 0 and 7.9 and within 180 days    Hemoglobin A1C  Date Value Ref Range Status  06/08/2019 7.9 (A) 4.0 - 5.6 % Final   Hgb A1c MFr Bld  Date Value Ref Range Status  10/25/2018 7.4 (H) <5.7 % of total Hgb Final    Comment:    For someone without known diabetes, a hemoglobin A1c value of 6.5% or greater indicates that they may have  diabetes and this should be confirmed with a follow-up  test. . For someone with known diabetes, a value <7% indicates  that their diabetes is well controlled and a value  greater than or equal  to 7% indicates suboptimal  control. A1c targets should be individualized based on  duration of diabetes, age, comorbid conditions, and  other considerations. . Currently, no consensus exists regarding use of hemoglobin A1c for diagnosis of diabetes for children. Marland Kitchen

## 2019-09-03 NOTE — Telephone Encounter (Signed)
Requested Prescriptions  Pending Prescriptions Disp Refills  . glimepiride (AMARYL) 4 MG tablet [Pharmacy Med Name: GLIMEPIRIDE 4 MG TAB] 90 tablet 0    Sig: TAKE 1 TABLET BY MOUTH ONCE EVERY MORNING     Endocrinology:  Diabetes - Sulfonylureas Failed - 09/03/2019  8:50 AM      Failed - Valid encounter within last 6 months    Recent Outpatient Visits          2 months ago Peripheral vascular disease ([place])   [facility], Devonne Doughty, DO   10 months ago Annual physical exam   [facility] Olin Hauser, DO   1 year ago Type 2 diabetes mellitus with diabetic polyneuropathy, without long-term current use of insulin ([place])   [facility], Devonne Doughty, DO   2 years ago Type 2 diabetes mellitus with diabetic polyneuropathy, without long-term current use of insulin ([place])   [facility], Devonne Doughty, DO   2 years ago Type 2 diabetes mellitus with diabetic polyneuropathy, without long-term current use of insulin ([place])   [facility], Devonne Doughty, DO             Passed - HBA1C is between 0 and 7.9 and within 180 days    Hemoglobin A1C  Date Value Ref Range Status  06/08/2019 7.9 (A) 4.0 - 5.6 % Final   Hgb A1c MFr Bld  Date Value Ref Range Status  10/25/2018 7.4 (H) <5.7 % of total Hgb Final    Comment:    For someone without known diabetes, a hemoglobin A1c value of 6.5% or greater indicates that they may have  diabetes and this should be confirmed with a follow-up  test. . For someone with known diabetes, a value <7% indicates  that their diabetes is well controlled and a value  greater than or equal to 7% indicates suboptimal  control. A1c targets should be individualized based on  duration of diabetes, age, comorbid conditions, and  other considerations. . Currently, no consensus exists regarding use of hemoglobin A1c for diagnosis of diabetes  for children. .          . baclofen (LIORESAL) 10 MG tablet [Pharmacy Med Name: BACLOFEN 10 MG TAB] 90 each     Sig: TAKE 1 TABLET BY MOUTH 3 TIMES DAILY AS NEEDED     Not Delegated - Analgesics:  Muscle Relaxants Failed - 09/03/2019  8:50 AM      Failed - This refill cannot be delegated      Failed - Valid encounter within last 6 months    Recent Outpatient Visits          2 months ago Peripheral vascular disease ([place])   [facility], Devonne Doughty, DO   10 months ago Annual physical exam   [facility] Olin Hauser, DO   1 year ago Type 2 diabetes mellitus with diabetic polyneuropathy, without long-term current use of insulin [facility])   [facility], Devonne Doughty, DO   2 years ago Type 2 diabetes mellitus with diabetic polyneuropathy, without long-term current use of insulin ([place])   [facility], Devonne Doughty, DO   2 years ago Type 2 diabetes mellitus with diabetic polyneuropathy, without long-term current use of insulin [facility])   [place], Devonne Doughty, DO

## 2019-09-03 NOTE — Telephone Encounter (Signed)
Pt rx refilled until upcoming appt Requested Prescriptions  Pending Prescriptions Disp Refills  . finasteride (PROSCAR) 5 MG tablet [Pharmacy Med Name: FINASTERIDE 5 MG TAB] 90 tablet 0    Sig: TAKE 1 TABLET BY MOUTH ONCE DAILY     Urology: 5-alpha Reductase Inhibitors Failed - 09/03/2019  8:50 AM      Failed - Valid encounter within last 12 months    Recent Outpatient Visits          2 months ago Peripheral vascular disease ([place])   [facility], Devonne Doughty, DO   10 months ago Annual physical exam   [facility], DO   1 year ago Type 2 diabetes mellitus with diabetic polyneuropathy, without long-term current use of insulin ([place])   [facility], Devonne Doughty, DO   2 years ago Type 2 diabetes mellitus with diabetic polyneuropathy, without long-term current use of insulin ([place])   [facility], Devonne Doughty, DO   2 years ago Type 2 diabetes mellitus with diabetic polyneuropathy, without long-term current use of insulin ([place])   [facility], Alexander J, DO             . benazepril (LOTENSIN) 40 MG tablet [Pharmacy Med Name: BENAZEPRIL HCL 40 MG TAB] 90 tablet 0    Sig: TAKE 1 TABLET BY MOUTH ONCE DAILY.     Cardiovascular:  ACE Inhibitors Failed - 09/03/2019  8:50 AM      Failed - Cr in normal range and within 180 days    Creat  Date Value Ref Range Status  10/25/2018 1.27 (H) 0.70 - 1.18 mg/dL Final    Comment:    For patients >62 years of age, the reference limit for Creatinine is approximately 13% higher for people identified as African-American. .    Creatinine, Ser  Date Value Ref Range Status  07/23/2019 1.40 (H) 0.61 - 1.24 mg/dL Final         Failed - K in normal range and within 180 days    Potassium  Date Value Ref Range Status  07/23/2019 3.4 (L) 3.5 - 5.1 mmol/L Final  01/05/2012 3.8 3.5 - 5.1 mmol/L Final          Failed - Valid encounter within last 6 months    Recent Outpatient Visits          2 months ago Peripheral vascular disease ([place])   [facility] Olin Hauser, DO   10 months ago Annual physical exam   [facility] Olin Hauser, DO   1 year ago Type 2 diabetes mellitus with diabetic polyneuropathy, without long-term current use of insulin ([place])   [facility], Devonne Doughty, DO   2 years ago Type 2 diabetes mellitus with diabetic polyneuropathy, without long-term current use of insulin ([place])   [facility], Devonne Doughty, DO   2 years ago Type 2 diabetes mellitus with diabetic polyneuropathy, without long-term current use of insulin [facility])   [facility], Devonne Doughty, DO             Passed - Patient is not pregnant      Passed - Last BP in normal range    BP Readings from Last 1 Encounters:  08/26/19 112/69

## 2019-09-06 LAB — AEROBIC CULTURE W GRAM STAIN (SUPERFICIAL SPECIMEN): Gram Stain: NONE SEEN

## 2019-09-09 ENCOUNTER — Other Ambulatory Visit: Payer: Self-pay

## 2019-09-09 ENCOUNTER — Emergency Department: Payer: Medicare Other

## 2019-09-09 ENCOUNTER — Inpatient Hospital Stay
Admission: EM | Admit: 2019-09-09 | Discharge: 2019-09-16 | DRG: 299 | Disposition: A | Discharge status: Skilled Nursing Facility | Payer: Medicare Other | Attending: Internal Medicine | Admitting: Internal Medicine

## 2019-09-09 ENCOUNTER — Ambulatory Visit: Payer: Medicare Other | Admitting: Physician Assistant

## 2019-09-09 ENCOUNTER — Ambulatory Visit: Payer: Self-pay

## 2019-09-09 ENCOUNTER — Encounter: Payer: Self-pay | Admitting: Emergency Medicine

## 2019-09-09 ENCOUNTER — Ambulatory Visit (INDEPENDENT_AMBULATORY_CARE_PROVIDER_SITE_OTHER): Payer: Medicare Other | Admitting: Vascular Surgery

## 2019-09-09 DIAGNOSIS — I2510 Atherosclerotic heart disease of native coronary artery without angina pectoris: Secondary | ICD-10-CM | POA: Diagnosis present

## 2019-09-09 DIAGNOSIS — N179 Acute kidney failure, unspecified: Secondary | ICD-10-CM | POA: Diagnosis present

## 2019-09-09 DIAGNOSIS — E1165 Type 2 diabetes mellitus with hyperglycemia: Secondary | ICD-10-CM | POA: Diagnosis present

## 2019-09-09 DIAGNOSIS — I739 Peripheral vascular disease, unspecified: Secondary | ICD-10-CM | POA: Diagnosis present

## 2019-09-09 DIAGNOSIS — N183 Chronic kidney disease, stage 3 unspecified: Secondary | ICD-10-CM | POA: Diagnosis present

## 2019-09-09 DIAGNOSIS — Z8249 Family history of ischemic heart disease and other diseases of the circulatory system: Secondary | ICD-10-CM

## 2019-09-09 DIAGNOSIS — M86171 Other acute osteomyelitis, right ankle and foot: Secondary | ICD-10-CM | POA: Diagnosis not present

## 2019-09-09 DIAGNOSIS — E1169 Type 2 diabetes mellitus with other specified complication: Secondary | ICD-10-CM | POA: Diagnosis present

## 2019-09-09 DIAGNOSIS — E43 Unspecified severe protein-calorie malnutrition: Secondary | ICD-10-CM | POA: Diagnosis not present

## 2019-09-09 DIAGNOSIS — R531 Weakness: Secondary | ICD-10-CM | POA: Diagnosis present

## 2019-09-09 DIAGNOSIS — I25810 Atherosclerosis of coronary artery bypass graft(s) without angina pectoris: Secondary | ICD-10-CM | POA: Diagnosis not present

## 2019-09-09 DIAGNOSIS — Z87891 Personal history of nicotine dependence: Secondary | ICD-10-CM

## 2019-09-09 DIAGNOSIS — I10 Essential (primary) hypertension: Secondary | ICD-10-CM | POA: Diagnosis present

## 2019-09-09 DIAGNOSIS — Z20822 Contact with and (suspected) exposure to covid-19: Secondary | ICD-10-CM | POA: Diagnosis present

## 2019-09-09 DIAGNOSIS — E1122 Type 2 diabetes mellitus with diabetic chronic kidney disease: Secondary | ICD-10-CM | POA: Diagnosis present

## 2019-09-09 DIAGNOSIS — I872 Venous insufficiency (chronic) (peripheral): Secondary | ICD-10-CM | POA: Diagnosis present

## 2019-09-09 DIAGNOSIS — L97416 Non-pressure chronic ulcer of right heel and midfoot with bone involvement without evidence of necrosis: Secondary | ICD-10-CM | POA: Diagnosis present

## 2019-09-09 DIAGNOSIS — I70249 Atherosclerosis of native arteries of left leg with ulceration of unspecified site: Secondary | ICD-10-CM | POA: Diagnosis not present

## 2019-09-09 DIAGNOSIS — M86172 Other acute osteomyelitis, left ankle and foot: Secondary | ICD-10-CM | POA: Diagnosis not present

## 2019-09-09 DIAGNOSIS — L97516 Non-pressure chronic ulcer of other part of right foot with bone involvement without evidence of necrosis: Secondary | ICD-10-CM | POA: Diagnosis present

## 2019-09-09 DIAGNOSIS — F419 Anxiety disorder, unspecified: Secondary | ICD-10-CM | POA: Diagnosis present

## 2019-09-09 DIAGNOSIS — R600 Localized edema: Secondary | ICD-10-CM | POA: Diagnosis not present

## 2019-09-09 DIAGNOSIS — L97426 Non-pressure chronic ulcer of left heel and midfoot with bone involvement without evidence of necrosis: Secondary | ICD-10-CM | POA: Diagnosis present

## 2019-09-09 DIAGNOSIS — Z03818 Encounter for observation for suspected exposure to other biological agents ruled out: Secondary | ICD-10-CM | POA: Diagnosis not present

## 2019-09-09 DIAGNOSIS — L97506 Non-pressure chronic ulcer of other part of unspecified foot with bone involvement without evidence of necrosis: Secondary | ICD-10-CM | POA: Diagnosis not present

## 2019-09-09 DIAGNOSIS — Z833 Family history of diabetes mellitus: Secondary | ICD-10-CM

## 2019-09-09 DIAGNOSIS — I129 Hypertensive chronic kidney disease with stage 1 through stage 4 chronic kidney disease, or unspecified chronic kidney disease: Secondary | ICD-10-CM | POA: Diagnosis present

## 2019-09-09 DIAGNOSIS — M6281 Muscle weakness (generalized): Secondary | ICD-10-CM | POA: Diagnosis not present

## 2019-09-09 DIAGNOSIS — L03116 Cellulitis of left lower limb: Secondary | ICD-10-CM | POA: Diagnosis present

## 2019-09-09 DIAGNOSIS — G4733 Obstructive sleep apnea (adult) (pediatric): Secondary | ICD-10-CM | POA: Diagnosis present

## 2019-09-09 DIAGNOSIS — L97423 Non-pressure chronic ulcer of left heel and midfoot with necrosis of muscle: Secondary | ICD-10-CM | POA: Diagnosis not present

## 2019-09-09 DIAGNOSIS — E1151 Type 2 diabetes mellitus with diabetic peripheral angiopathy without gangrene: Principal | ICD-10-CM | POA: Diagnosis present

## 2019-09-09 DIAGNOSIS — N400 Enlarged prostate without lower urinary tract symptoms: Secondary | ICD-10-CM | POA: Diagnosis present

## 2019-09-09 DIAGNOSIS — Z95820 Peripheral vascular angioplasty status with implants and grafts: Secondary | ICD-10-CM

## 2019-09-09 DIAGNOSIS — M7989 Other specified soft tissue disorders: Secondary | ICD-10-CM | POA: Diagnosis not present

## 2019-09-09 DIAGNOSIS — J449 Chronic obstructive pulmonary disease, unspecified: Secondary | ICD-10-CM | POA: Diagnosis not present

## 2019-09-09 DIAGNOSIS — Z48812 Encounter for surgical aftercare following surgery on the circulatory system: Secondary | ICD-10-CM | POA: Diagnosis not present

## 2019-09-09 DIAGNOSIS — M65172 Other infective (teno)synovitis, left ankle and foot: Secondary | ICD-10-CM | POA: Diagnosis present

## 2019-09-09 DIAGNOSIS — Z85828 Personal history of other malignant neoplasm of skin: Secondary | ICD-10-CM

## 2019-09-09 DIAGNOSIS — E11621 Type 2 diabetes mellitus with foot ulcer: Secondary | ICD-10-CM | POA: Diagnosis present

## 2019-09-09 DIAGNOSIS — Z7901 Long term (current) use of anticoagulants: Secondary | ICD-10-CM | POA: Diagnosis not present

## 2019-09-09 DIAGNOSIS — E1140 Type 2 diabetes mellitus with diabetic neuropathy, unspecified: Secondary | ICD-10-CM | POA: Diagnosis not present

## 2019-09-09 DIAGNOSIS — I70235 Atherosclerosis of native arteries of right leg with ulceration of other part of foot: Secondary | ICD-10-CM | POA: Diagnosis present

## 2019-09-09 DIAGNOSIS — I639 Cerebral infarction, unspecified: Secondary | ICD-10-CM | POA: Diagnosis not present

## 2019-09-09 DIAGNOSIS — Z951 Presence of aortocoronary bypass graft: Secondary | ICD-10-CM

## 2019-09-09 DIAGNOSIS — Z7982 Long term (current) use of aspirin: Secondary | ICD-10-CM

## 2019-09-09 DIAGNOSIS — E785 Hyperlipidemia, unspecified: Secondary | ICD-10-CM | POA: Diagnosis present

## 2019-09-09 DIAGNOSIS — N1831 Chronic kidney disease, stage 3a: Secondary | ICD-10-CM | POA: Diagnosis not present

## 2019-09-09 DIAGNOSIS — R4702 Dysphasia: Secondary | ICD-10-CM

## 2019-09-09 DIAGNOSIS — M62838 Other muscle spasm: Secondary | ICD-10-CM | POA: Diagnosis not present

## 2019-09-09 DIAGNOSIS — R279 Unspecified lack of coordination: Secondary | ICD-10-CM | POA: Diagnosis not present

## 2019-09-09 DIAGNOSIS — I70244 Atherosclerosis of native arteries of left leg with ulceration of heel and midfoot: Secondary | ICD-10-CM | POA: Diagnosis present

## 2019-09-09 DIAGNOSIS — I70239 Atherosclerosis of native arteries of right leg with ulceration of unspecified site: Secondary | ICD-10-CM | POA: Diagnosis not present

## 2019-09-09 DIAGNOSIS — L97413 Non-pressure chronic ulcer of right heel and midfoot with necrosis of muscle: Secondary | ICD-10-CM | POA: Diagnosis not present

## 2019-09-09 DIAGNOSIS — Z7984 Long term (current) use of oral hypoglycemic drugs: Secondary | ICD-10-CM

## 2019-09-09 DIAGNOSIS — L03115 Cellulitis of right lower limb: Secondary | ICD-10-CM | POA: Diagnosis present

## 2019-09-09 DIAGNOSIS — Z7902 Long term (current) use of antithrombotics/antiplatelets: Secondary | ICD-10-CM

## 2019-09-09 DIAGNOSIS — E876 Hypokalemia: Secondary | ICD-10-CM | POA: Diagnosis not present

## 2019-09-09 DIAGNOSIS — R1310 Dysphagia, unspecified: Secondary | ICD-10-CM | POA: Diagnosis not present

## 2019-09-09 DIAGNOSIS — Z6823 Body mass index (BMI) 23.0-23.9, adult: Secondary | ICD-10-CM

## 2019-09-09 DIAGNOSIS — Z79899 Other long term (current) drug therapy: Secondary | ICD-10-CM

## 2019-09-09 DIAGNOSIS — R5381 Other malaise: Secondary | ICD-10-CM | POA: Diagnosis not present

## 2019-09-09 DIAGNOSIS — I70234 Atherosclerosis of native arteries of right leg with ulceration of heel and midfoot: Secondary | ICD-10-CM | POA: Diagnosis present

## 2019-09-09 DIAGNOSIS — I25119 Atherosclerotic heart disease of native coronary artery with unspecified angina pectoris: Secondary | ICD-10-CM | POA: Diagnosis not present

## 2019-09-09 DIAGNOSIS — I714 Abdominal aortic aneurysm, without rupture: Secondary | ICD-10-CM | POA: Diagnosis present

## 2019-09-09 DIAGNOSIS — E1142 Type 2 diabetes mellitus with diabetic polyneuropathy: Secondary | ICD-10-CM | POA: Diagnosis present

## 2019-09-09 DIAGNOSIS — Z955 Presence of coronary angioplasty implant and graft: Secondary | ICD-10-CM

## 2019-09-09 LAB — COMPREHENSIVE METABOLIC PANEL
ALT: 97 U/L — ABNORMAL HIGH (ref 0–44)
AST: 188 U/L — ABNORMAL HIGH (ref 15–41)
Albumin: 3 g/dL — ABNORMAL LOW (ref 3.5–5.0)
Alkaline Phosphatase: 89 U/L (ref 38–126)
Anion gap: 12 (ref 5–15)
BUN: 35 mg/dL — ABNORMAL HIGH (ref 8–23)
CO2: 22 mmol/L (ref 22–32)
Calcium: 9 mg/dL (ref 8.9–10.3)
Chloride: 104 mmol/L (ref 98–111)
Creatinine, Ser: 1.68 mg/dL — ABNORMAL HIGH (ref 0.61–1.24)
GFR calc Af Amer: 44 mL/min — ABNORMAL LOW (ref >60)
GFR calc non Af Amer: 38 mL/min — ABNORMAL LOW (ref >60)
Glucose, Bld: 204 mg/dL — ABNORMAL HIGH (ref 70–99)
Potassium: 4.5 mmol/L (ref 3.5–5.1)
Sodium: 138 mmol/L (ref 135–145)
Total Bilirubin: 2 mg/dL — ABNORMAL HIGH (ref 0.3–1.2)
Total Protein: 6.5 g/dL (ref 6.5–8.1)

## 2019-09-09 LAB — CBC WITH DIFFERENTIAL/PLATELET
Abs Immature Granulocytes: 0.06 10*3/uL (ref 0.00–0.07)
Basophils Absolute: 0 10*3/uL (ref 0.0–0.1)
Basophils Relative: 0 %
Eosinophils Absolute: 0 10*3/uL (ref 0.0–0.5)
Eosinophils Relative: 0 %
HCT: 42.1 % (ref 39.0–52.0)
Hemoglobin: 14.2 g/dL (ref 13.0–17.0)
Immature Granulocytes: 1 %
Lymphocytes Relative: 7 %
Lymphs Abs: 0.8 10*3/uL (ref 0.7–4.0)
MCH: 29.9 pg (ref 26.0–34.0)
MCHC: 33.7 g/dL (ref 30.0–36.0)
MCV: 88.6 fL (ref 80.0–100.0)
Monocytes Absolute: 0.6 10*3/uL (ref 0.1–1.0)
Monocytes Relative: 5 %
Neutro Abs: 11.2 10*3/uL — ABNORMAL HIGH (ref 1.7–7.7)
Neutrophils Relative %: 87 %
Platelets: 196 10*3/uL (ref 150–400)
RBC: 4.75 MIL/uL (ref 4.22–5.81)
RDW: 17.1 % — ABNORMAL HIGH (ref 11.5–15.5)
WBC: 12.7 10*3/uL — ABNORMAL HIGH (ref 4.0–10.5)
nRBC: 0 % (ref 0.0–0.2)

## 2019-09-09 LAB — LACTIC ACID, PLASMA
Lactic Acid, Venous: 2.8 mmol/L (ref 0.5–1.9)
Lactic Acid, Venous: 2.5 mmol/L (ref 0.5–1.9)

## 2019-09-09 LAB — SARS CORONAVIRUS 2 BY RT PCR (HOSPITAL ORDER, PERFORMED IN ~~LOC~~ HOSPITAL LAB): SARS Coronavirus 2: NEGATIVE

## 2019-09-09 MED ORDER — SODIUM CHLORIDE 0.9 % IV SOLN
2.0000 g | Freq: Two times a day (BID) | INTRAVENOUS | Status: DC
  Administered 2019-09-09 – 2019-09-15 (×12): 2 g via INTRAVENOUS
  Filled 2019-09-09 (×16): qty 2

## 2019-09-09 MED ORDER — MORPHINE SULFATE (PF) 2 MG/ML IV SOLN: 2.0000 mg | INTRAVENOUS | Status: DC | PRN

## 2019-09-09 MED ORDER — SODIUM CHLORIDE 0.9 % IV SOLN: INTRAVENOUS | Status: DC

## 2019-09-09 MED ORDER — HEPARIN SODIUM (PORCINE) 5000 UNIT/ML IJ SOLN
5000.0000 [IU] | Freq: Three times a day (TID) | INTRAMUSCULAR | Status: DC
  Administered 2019-09-09 – 2019-09-10 (×2): 5000 [IU] via SUBCUTANEOUS
  Filled 2019-09-09 (×2): qty 1

## 2019-09-09 MED ORDER — ACETAMINOPHEN 650 MG RE SUPP: 650.0000 mg | Freq: Four times a day (QID) | RECTAL | Status: DC | PRN

## 2019-09-09 MED ORDER — VANCOMYCIN HCL 750 MG/150ML IV SOLN
750.0000 mg | INTRAVENOUS | Status: DC
  Administered 2019-09-10: 750 mg via INTRAVENOUS
  Filled 2019-09-09 (×2): qty 150

## 2019-09-09 MED ORDER — SODIUM CHLORIDE 0.9 % IV SOLN
2.0000 g | Freq: Once | INTRAVENOUS | Status: AC
  Administered 2019-09-09: 2 g via INTRAVENOUS
  Filled 2019-09-09: qty 20

## 2019-09-09 MED ORDER — VANCOMYCIN HCL 1750 MG/350ML IV SOLN
1750.0000 mg | Freq: Once | INTRAVENOUS | Status: AC
  Administered 2019-09-09: 1750 mg via INTRAVENOUS
  Filled 2019-09-09: qty 350

## 2019-09-09 MED ORDER — ONDANSETRON HCL 4 MG/2ML IJ SOLN
4.0000 mg | Freq: Four times a day (QID) | INTRAMUSCULAR | Status: DC | PRN
  Administered 2019-09-16: 4 mg via INTRAVENOUS
  Filled 2019-09-09 (×4): qty 2

## 2019-09-09 MED ORDER — SODIUM CHLORIDE 0.9% FLUSH: 3.0000 mL | Freq: Once | INTRAVENOUS | Status: DC

## 2019-09-09 MED ORDER — VANCOMYCIN HCL IN DEXTROSE 1-5 GM/200ML-% IV SOLN
1000.0000 mg | Freq: Once | INTRAVENOUS | Status: DC
  Filled 2019-09-09: qty 200

## 2019-09-09 MED ORDER — ONDANSETRON HCL 4 MG PO TABS: 4.0000 mg | ORAL_TABLET | Freq: Four times a day (QID) | ORAL | Status: DC | PRN

## 2019-09-09 MED ORDER — SODIUM CHLORIDE 0.9 % IV BOLUS
500.0000 mL | Freq: Once | INTRAVENOUS | Status: AC
  Administered 2019-09-09: 500 mL via INTRAVENOUS

## 2019-09-09 MED ORDER — ACETAMINOPHEN 325 MG PO TABS
650.0000 mg | ORAL_TABLET | Freq: Four times a day (QID) | ORAL | Status: DC | PRN
  Administered 2019-09-14: 650 mg via ORAL
  Filled 2019-09-09: qty 2

## 2019-09-09 NOTE — Progress Notes (Signed)
Pharmacy Antibiotic Note  Randy Howard is a 79 y.o. male admitted on 09/09/2019 with osteomyelitis.  Pharmacy has been consulted for Vanc, Cefepime dosing.  Plan: Vancomycin 1750 mg IV X 1 ordered to be given on 6/18 @ 2200. Vancomycin 750 mg IV Q24H ordered to be given on 6/19  @ 2200. Vanc trough before 5th dose on 6/20 @ 2130.   Cefepime 2 gm IV Q12H ordered to start on 6/18 @ 2200.   Weight: 68.5 kg (151 lb)  Temp (24hrs), Avg:98.2 F (36.8 C), Min:98.2 F (36.8 C), Max:98.2 F (36.8 C)  Recent Labs  Lab 09/09/19 1412 09/09/19 2006  WBC 12.7*  --   CREATININE 1.68*  --   LATICACIDVEN 2.8* 2.5*    Estimated Creatinine Clearance: 34 mL/min (A) (by C-G formula based on SCr of 1.68 mg/dL (H)).    Allergies  Allergen Reactions  . Oxycodone Nausea Only    Antimicrobials this admission:   >>    >>   Dose adjustments this admission:   Microbiology results:  BCx:   UCx:    Sputum:    MRSA PCR:   Thank you for allowing pharmacy to be a part of this patient's care.  Dravyn Severs D 09/09/2019 9:05 PM

## 2019-09-09 NOTE — ED Notes (Signed)
cellphone provided to pt

## 2019-09-09 NOTE — ED Triage Notes (Signed)
Pt to ED via POV c/o leg pain and swelling. Pts leg is red and weeping. Pt states that he is being treated by Dr. Cleda Mccreedy for sore on his left foot. Pt also states that he has not had an appetite for the past 6 months. Pt is in NAD.

## 2019-09-09 NOTE — Progress Notes (Signed)
PHARMACY -  BRIEF ANTIBIOTIC NOTE   Pharmacy has received consult(s) for vancomycin from an ED provider.  The patient's profile has been reviewed for ht/wt/allergies/indication/available labs.    One time order(s) placed for vanc 1 g  Further antibiotics/pharmacy consults should be ordered by admitting physician if indicated.                       Thank you,  Tawnya Crook, PharmD 09/09/2019  7:57 PM

## 2019-09-09 NOTE — ED Provider Notes (Signed)
[facility] Emergency Department Provider Note  ____________________________________________   First MD Initiated Contact with Patient 09/09/19 1853     (approximate)  I have reviewed the triage vital signs and the nursing notes.   HISTORY  Chief Complaint Leg Pain and Anorexia    HPI Randy Howard is a 79 y.o. male with history of coronary disease, hypertension, hyperlipidemia, peripheral vascular disease status post stent placement, followed by Dr. Lucky Howard, feet ulcers followed by Dr. Cleda Howard who comes in for concern for worsening leg pain.  Patient states he has pain in his bilateral feet that is constant, worse with walking, nothing makes better.  The pain is moderate in nature.  States that he is post to have a follow-up appoint with Dr. Lucky Howard but could not get out of bed due to weakness.  States has not really been hungry and eating as much.  He does have some swelling noted to the bilateral legs.  He is on any current antibiotics.  Review of records patient had x-ray done on 6/11 showing osteomyelitis of the right toe.  Not see any notes about what they are planning to do with this information.          Past Medical History:  Diagnosis Date  . Arthritis   . Coronary artery disease   . Dyspnea   . Elevated lipids   . Hyperlipidemia   . Hypertension   . Neuropathy   . Nocturia   . Obstructive sleep apnea   . Peripheral vascular disease ([place])   . Prostate enlargement   . PVD (peripheral vascular disease) ([place])   . Right carotid bruit   . Skin cancer    Followed by Dr. Evorn Howard  . Skin cancer   . Tremor     Patient Active Problem List   Diagnosis Date Noted  . Systolic heart failure ([place]) 07/22/2019  . Fall at home, initial encounter 07/21/2019  . Fall 07/21/2019  . Atherosclerotic vascular disease 09/23/2018  . Atherosclerosis of native arteries of extremity with rest pain ([place]) 09/10/2018  . Swelling of limb 09/01/2017  . CKD (chronic kidney  disease), stage III 06/09/2017  . BPH (benign prostatic hyperplasia) 12/29/2016  . Insomnia 11/12/2016  . Osteoarthritis of multiple joints 07/28/2016  . Neuropathy 07/04/2016  . Early satiety 11/05/2015  . Weight loss 11/05/2015  . Benign head tremor 07/30/2015  . Tremor 06/04/2015  . Inguinal hernia 06/04/2015  . Anxiety disorder 03/07/2015  . Atherosclerosis of native arteries of the extremities with ulceration ([place]) 11/01/2014  . Hypertension 10/06/2014  . Elevated cholesterol 10/06/2014  . Chronic venous insufficiency 10/06/2014  . Peripheral vascular disease ([place]) 10/06/2014  . Difficulty in walking 08/18/2013  . Dizziness 08/18/2013  . CAD (coronary artery disease) 08/16/2013  . Cardiac murmur 08/16/2013  . Breath shortness 08/16/2013  . Chest pain 08/16/2013  . Type 2 diabetes mellitus with stage 3 chronic kidney disease ([place]) 08/16/2013    Past Surgical History:  Procedure Laterality Date  . CARDIAC CATHETERIZATION    . CAROTID ARTERY ANGIOPLASTY Left 09/30/2011  . CORONARY ARTERY BYPASS GRAFT  2006  . ENDARTERECTOMY FEMORAL Bilateral 11/01/2014   Procedure: ENDARTERECTOMY FEMORAL;  Surgeon: Algernon Huxley, MD;  Location: [facility] ORS;  Service: Vascular;  Laterality: Bilateral;  . LOWER EXTREMITY ANGIOGRAPHY Left 04/26/2018   Procedure: LOWER EXTREMITY ANGIOGRAPHY;  Surgeon: Algernon Huxley, MD;  Location: [place] CV LAB;  Service: Cardiovascular;  Laterality: Left;  . LOWER EXTREMITY ANGIOGRAPHY Right 09/23/2018  Procedure: LOWER EXTREMITY ANGIOGRAPHY;  Surgeon: Algernon Huxley, MD;  Location: [place] CV LAB;  Service: Cardiovascular;  Laterality: Right;  . LOWER EXTREMITY ANGIOGRAPHY Right 11/12/2018   Procedure: LOWER EXTREMITY ANGIOGRAPHY;  Surgeon: Algernon Huxley, MD;  Location: [place] CV LAB;  Service: Cardiovascular;  Laterality: Right;  . PERIPHERAL VASCULAR CATHETERIZATION Left 10/09/2014   Procedure: Lower Extremity Angiography;  Surgeon: Algernon Huxley, MD;   Location: [place] CV LAB;  Service: Cardiovascular;  Laterality: Left;  . PERIPHERAL VASCULAR CATHETERIZATION  10/09/2014   Procedure: Lower Extremity Intervention;  Surgeon: Algernon Huxley, MD;  Location: [place] CV LAB;  Service: Cardiovascular;;    Prior to Admission medications   Medication Sig Start Date End Date Taking? Authorizing Provider  atorvastatin (LIPITOR) 10 MG tablet Take 10 mg by mouth daily.   Yes [provider]  baclofen (LIORESAL) 10 MG tablet TAKE 1 TABLET BY MOUTH 3 TIMES DAILY AS NEEDED 09/05/19  Yes Howard, Randy J, DO  benazepril (LOTENSIN) 40 MG tablet TAKE 1 TABLET BY MOUTH ONCE DAILY. 09/03/19  Yes Howard, Randy P, DO  clopidogrel (PLAVIX) 75 MG tablet TAKE 1 TABLET BY MOUTH ONCE DAILY. Patient taking differently: Take 75 mg by mouth daily.  08/16/18  Yes Howard, Randy Doughty, DO  finasteride (PROSCAR) 5 MG tablet TAKE 1 TABLET BY MOUTH ONCE DAILY 09/03/19  Yes Howard, Randy P, DO  fluticasone (FLONASE) 50 MCG/ACT nasal spray Place 2 sprays into both nostrils daily. Use for 4-6 weeks then stop and use seasonally or as needed. 06/08/19  Yes Howard, Randy Doughty, DO  gabapentin (NEURONTIN) 100 MG capsule Take 1-2 capsules (100-200 mg total) by mouth See admin instructions. Take 2 capsules (200mg ) by mouth every morning and take 1 to 2 capsules (100mg -200mg ) by mouth every night 07/23/19  Yes Randy Bi, MD  glimepiride (AMARYL) 4 MG tablet TAKE 1 TABLET BY MOUTH ONCE EVERY MORNING 09/03/19  Yes Howard, Randy Doughty, DO  hydrOXYzine (ATARAX/VISTARIL) 10 MG tablet Take 1 tablet (10 mg total) by mouth 3 (three) times daily as needed for itching or anxiety. 07/23/19  Yes Randy Bi, MD  metoprolol tartrate (LOPRESSOR) 50 MG tablet Take 50 mg by mouth 2 (two) times daily. 09/03/19  Yes [provider]  tamsulosin (FLOMAX) 0.4 MG CAPS capsule Take 1 capsule (0.4 mg total) by mouth daily. 07/23/19  Yes Randy Bi, MD  aspirin EC 81 MG tablet  Take 1 tablet (81 mg total) by mouth daily. 04/26/18   Algernon Huxley, MD  busPIRone (BUSPAR) 5 MG tablet Take 1 tablet (5 mg total) by mouth at bedtime as needed (anxiety). 07/23/19   Randy Bi, MD  feeding supplement, ENSURE ENLIVE, (ENSURE ENLIVE) LIQD Take 237 mLs by mouth 2 (two) times daily between meals. Or equivalent Boosts. 07/23/19   Randy Bi, MD  Magnesium Oxide, Antacid, 500 MG CAPS Take 500 mg by mouth daily as needed (stomach acid).     [provider]  nitroGLYCERIN (NITROSTAT) 0.4 MG SL tablet Place 1 tablet (0.4 mg total) under the tongue every 5 (five) minutes as needed for chest pain. 10/18/15   Arlis Porta., MD  Potassium 99 MG TABS Take 99 mg by mouth daily.     [provider]  torsemide (DEMADEX) 20 MG tablet Take 1 tablet (20 mg total) by mouth daily. (take 1 additional tablet (20mg ) by mouth daily as needed for excess weight) 07/23/19 08/22/19  Randy Bi, MD  Allergies Oxycodone  Family History  Problem Relation Age of Onset  . Heart disease Sister   . Heart disease Sister   . Diabetes Sister   . Diabetes Sister   . Diabetes Brother     Social History Social History   Tobacco Use  . Smoking status: Former Smoker    Packs/day: 1.00    Years: 25.00    Pack years: 25.00    Types: Cigarettes    Quit date: 03/24/1985    Years since quitting: 34.4  . Smokeless tobacco: Former Network engineer  . Vaping Use: Never used  Substance Use Topics  . Alcohol use: No    Alcohol/week: 0.0 standard drinks  . Drug use: No      Review of Systems Constitutional: No fever/chills, weakness Eyes: No visual changes. ENT: No sore throat. Cardiovascular: Denies chest pain. Respiratory: Denies shortness of breath. Gastrointestinal: No abdominal pain.  No nausea, no vomiting.  No diarrhea.  No constipation. Genitourinary: Negative for dysuria. Musculoskeletal: Negative for back pain.  Feet pain Skin: Negative for rash. Neurological: Negative for  headaches, focal weakness or numbness. All other ROS negative ____________________________________________   PHYSICAL EXAM:  VITAL SIGNS: ED Triage Vitals  Enc Vitals Group     BP 09/09/19 1409 125/74     Pulse Rate 09/09/19 1409 77     Resp 09/09/19 1409 18     Temp 09/09/19 1409 98.2 F (36.8 C)     Temp Source 09/09/19 1409 Oral     SpO2 09/09/19 1409 100 %     Weight 09/09/19 1410 151 lb (68.5 kg)     Height --      Head Circumference --      Peak Flow --      Pain Score 09/09/19 1409 10     Pain Loc --      Pain Edu? --      Excl. in [place]? --     Constitutional: Alert and oriented. Well appearing and in no acute distress. Eyes: Conjunctivae are normal. EOMI. Head: Atraumatic. Nose: No congestion/rhinnorhea. Mouth/Throat: Mucous membranes are moist.   Neck: No stridor. Trachea Midline. FROM Cardiovascular: Normal rate, regular rhythm. Grossly normal heart sounds.  Good peripheral circulation. Respiratory: Normal respiratory effort.  No retractions. Lungs CTAB. Gastrointestinal: Soft and nontender. No distention. No abdominal bruits.  Musculoskeletal:   Ulcer noted to the bottom of the left foot with purulent drainage.  Ulcer also noted to the right toe probes down to bone.  Feet are warm.  Chronic venous stasis changes of the bilateral legs with some edema noted.  Right leg larger than left. Neurologic:  Normal speech and language. No gross focal neurologic deficits are appreciated.  Skin:  Skin is warm, dry and intact. No rash noted. Psychiatric: Mood and affect are normal. Speech and behavior are normal. GU: Deferred   ____________________________________________   LABS (all labs ordered are listed, but only abnormal results are displayed)  Labs Reviewed  LACTIC ACID, PLASMA - Abnormal; Notable for the following components:      Result Value   Lactic Acid, Venous 2.8 (*)    All other components within normal limits  COMPREHENSIVE METABOLIC PANEL - Abnormal;  Notable for the following components:   Glucose, Bld 204 (*)    BUN 35 (*)    Creatinine, Ser 1.68 (*)    Albumin 3.0 (*)    AST 188 (*)    ALT 97 (*)    Total Bilirubin  2.0 (*)    GFR calc non Af Amer 38 (*)    GFR calc Af Amer 44 (*)    All other components within normal limits  CBC WITH DIFFERENTIAL/PLATELET - Abnormal; Notable for the following components:   WBC 12.7 (*)    RDW 17.1 (*)    Neutro Abs 11.2 (*)    All other components within normal limits  CULTURE, BLOOD (ROUTINE X 2)  CULTURE, BLOOD (ROUTINE X 2)  SARS CORONAVIRUS 2 BY RT PCR (HOSPITAL ORDER, [place] [facility])  LACTIC ACID, PLASMA  URINALYSIS, COMPLETE (UACMP) WITH MICROSCOPIC   ____________________________________________   RADIOLOGY   Official radiology report(s): No results found.  ____________________________________________   PROCEDURES  Procedure(s) performed (including Critical Care):  Procedures   ____________________________________________   INITIAL IMPRESSION / ASSESSMENT AND PLAN / ED COURSE  Randy Howard was evaluated in Emergency Department on 09/09/2019 for the symptoms described in the history of present illness. He was evaluated in the context of the global COVID-19 pandemic, which necessitated consideration that the patient might be at risk for infection with the SARS-CoV-2 virus that causes COVID-19. Institutional protocols and algorithms that pertain to the evaluation of patients at risk for COVID-19 are in a state of rapid change based on information released by regulatory bodies including the CDC and federal and state organizations. These policies and algorithms were followed during the patient's care in the ED.    Patient is a 79 year old who comes in with bilateral foot pain with ulcers on his bilateral feet.  View of records does show osteomyelitis of the right toe there is drainage and foul smell coming from the wounds.  Discussed with Dr Randy Howard.   Evaluated patient's wounds he said they looked worse than previous.  He recommends admission for IV antibiotics and to get a vascular consult to help decide on further management in the future.  At this time there is no signs of acute limb ischemia.  Ultrasounds were ordered to evaluate for DVT which were negative.  Labs were ordered to evaluate for dehydration.  Lactate elevated at 2.8 Kidney function 1.68 which seems to be around patient's baseline with past 1 month. Sugar slightly elevated at 204 but no evidence of DKA Liver function tests are slightly elevated with a T bili of 2.0.  Patient has no right upper quadrant tenderness White count is elevated 12.7   Blood cultures were added on.  We will give 500 cc of fluid for his lactate of 2.8 hold off on full 30 cc/kg given his significantly edematous legs and patient is not hypotensive or tachycardic.  Based upon prior cultures will add on vancomycin and ceftriaxone for his ulcers.  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 ([place])  Other acute osteomyelitis of right foot ([place])      MEDICATIONS GIVEN DURING THIS VISIT:  Medications  sodium chloride flush (NS) 0.9 % injection 3 mL (has no administration in time range)  vancomycin (VANCOCIN) IVPB 1000 mg/200 mL premix (has no administration in time range)  cefTRIAXone (ROCEPHIN) 2 g in sodium chloride 0.9 % 100 mL IVPB (2 g Intravenous New Bag/Given 09/09/19 2005)  sodium chloride 0.9 % bolus 500 mL (500 mLs Intravenous New Bag/Given 09/09/19 2004)     ED Discharge Orders    None  Note:  This document was prepared using Dragon voice recognition software and may include unintentional dictation errors.   Vanessa [place], MD 09/09/19 2024

## 2019-09-09 NOTE — H&P (Signed)
History and Physical   Randy Howard WIO:[id number] DOB: 01-27-1941 DOA: 09/09/2019  Referring MD/NP/PA: Dr. Jari Howard  PCP: Randy Hauser, DO   Outpatient Specialists: Dr. Lucky Howard, vascular surgery  Patient coming from: Home  Chief Complaint: Bilateral leg ulcers  HPI: Randy Howard is a 79 y.o. male with medical history significant of coronary artery disease, hypertension, hyperlipidemia, peripheral neuropathy, peripheral vascular disease, BPH, history of skin cancer, history of stent placement, history of carotid artery disease who is being followed by vascular surgery to continue presenting to the ER today with bilateral feet ulcers.  Patient was known to have vascular insufficiency as well as venous stasis dermatitis.  It appears he has multiple venous ulcers bilaterally.  He has pain in his feet bilaterally.  Pain is worse when he walks.  As up to 7 out of 10.  He has felt weak in the last few weeks.  He was unable to follow-up with vascular surgery due to this weakness.  He has been treated in the past with antibiotics.  In the ER patient was noted to have multiple plantar base ulcers of both feet the right more than the left.  X-ray of both feet is showed osteomyelitis on the right foot with visible ulcer and cellulitis of the left 1.  Patient also has multiple venous ulcers in the plantar aspect of the both feet.  He is being admitted to the hospital for treatment..  ED Course: Temperature is 98.2 blood pressure 139/94 pulse 85 respirate of 18 oxygen sat 100% room air.  White count 12.7 the rest of the CBC within normal.  Chemistry showed a BUN of 35 creatinine 1.6 and calcium 9.0.  His lactic acid 2.5.  Glucose 204.  X-ray of the left foot showed soft tissue ulceration lateral to the fifth metatarsal phalangeal joint.  No osteomyelitis.  X-ray of the right foot showed osteomyelitis at the first distal tuft.  There is also diffuse soft tissue swelling with small ulceration in the distal  aspect of the first toe.  Venous Doppler ultrasound showed no evidence of DVT.  Patient is being admitted for treatment of cellulitis and venous ulcers with osteomyelitis.  Review of Systems: As per HPI otherwise 10 point review of systems negative.    Past Medical History:  Diagnosis Date  . Arthritis   . Coronary artery disease   . Dyspnea   . Elevated lipids   . Hyperlipidemia   . Hypertension   . Neuropathy   . Nocturia   . Obstructive sleep apnea   . Peripheral vascular disease ([place])   . Prostate enlargement   . PVD (peripheral vascular disease) ([place])   . Right carotid bruit   . Skin cancer    Followed by Dr. Evorn Howard  . Skin cancer   . Tremor     Past Surgical History:  Procedure Laterality Date  . CARDIAC CATHETERIZATION    . CAROTID ARTERY ANGIOPLASTY Left 09/30/2011  . CORONARY ARTERY BYPASS GRAFT  2006  . ENDARTERECTOMY FEMORAL Bilateral 11/01/2014   Procedure: ENDARTERECTOMY FEMORAL;  Surgeon: Algernon Huxley, MD;  Location: [facility];  Service: Vascular;  Laterality: Bilateral;  . LOWER EXTREMITY ANGIOGRAPHY Left 04/26/2018   Procedure: LOWER EXTREMITY ANGIOGRAPHY;  Surgeon: Algernon Huxley, MD;  Location: [place] [facility];  Service: Cardiovascular;  Laterality: Left;  . LOWER EXTREMITY ANGIOGRAPHY Right 09/23/2018   Procedure: LOWER EXTREMITY ANGIOGRAPHY;  Surgeon: Algernon Huxley, MD;  Location: [place] [facility];  Service: Cardiovascular;  Laterality: Right;  . LOWER EXTREMITY ANGIOGRAPHY Right 11/12/2018   Procedure: LOWER EXTREMITY ANGIOGRAPHY;  Surgeon: Algernon Huxley, MD;  Location: [place] [facility];  Service: Cardiovascular;  Laterality: Right;  . PERIPHERAL VASCULAR CATHETERIZATION Left 10/09/2014   Procedure: Lower Extremity Angiography;  Surgeon: Algernon Huxley, MD;  Location: [place] [facility];  Service: Cardiovascular;  Laterality: Left;  . PERIPHERAL VASCULAR CATHETERIZATION  10/09/2014   Procedure: Lower Extremity Intervention;  Surgeon: Algernon Huxley, MD;   Location: [place] [facility];  Service: Cardiovascular;;     reports that he quit smoking about 34 years ago. His smoking use included cigarettes. He has a 25.00 pack-year smoking history. He has quit using smokeless tobacco. He reports that he does not drink alcohol and does not use drugs.  Allergies  Allergen Reactions  . Oxycodone Nausea Only    Family History  Problem Relation Age of Onset  . Heart disease Sister   . Heart disease Sister   . Diabetes Sister   . Diabetes Sister   . Diabetes Brother      Prior to Admission medications   Medication Sig Start Date End Date Taking? Authorizing Provider  atorvastatin (LIPITOR) 10 MG tablet Take 10 mg by mouth daily.   Yes [provider]  baclofen (LIORESAL) 10 MG tablet TAKE 1 TABLET BY MOUTH 3 TIMES DAILY AS NEEDED 09/05/19  Yes Karamalegos, Alexander J, DO  benazepril (LOTENSIN) 40 MG tablet TAKE 1 TABLET BY MOUTH ONCE DAILY. 09/03/19  Yes Johnson, Megan P, DO  clopidogrel (PLAVIX) 75 MG tablet TAKE 1 TABLET BY MOUTH ONCE DAILY. Patient taking differently: Take 75 mg by mouth daily.  08/16/18  Yes Karamalegos, Devonne Doughty, DO  finasteride (PROSCAR) 5 MG tablet TAKE 1 TABLET BY MOUTH ONCE DAILY 09/03/19  Yes Johnson, Megan P, DO  fluticasone (FLONASE) 50 MCG/ACT nasal spray Place 2 sprays into both nostrils daily. Use for 4-6 weeks then stop and use seasonally or as needed. 06/08/19  Yes Karamalegos, Devonne Doughty, DO  gabapentin (NEURONTIN) 100 MG capsule Take 1-2 capsules (100-200 mg total) by mouth See admin instructions. Take 2 capsules (200mg ) by mouth every morning and take 1 to 2 capsules (100mg -200mg ) by mouth every night 07/23/19  Yes Enzo Bi, MD  glimepiride (AMARYL) 4 MG tablet TAKE 1 TABLET BY MOUTH ONCE EVERY MORNING 09/03/19  Yes Karamalegos, Devonne Doughty, DO  hydrOXYzine (ATARAX/VISTARIL) 10 MG tablet Take 1 tablet (10 mg total) by mouth 3 (three) times daily as needed for itching or anxiety. 07/23/19  Yes Enzo Bi, MD    metoprolol tartrate (LOPRESSOR) 50 MG tablet Take 50 mg by mouth 2 (two) times daily. 09/03/19  Yes [provider]  tamsulosin (FLOMAX) 0.4 MG CAPS capsule Take 1 capsule (0.4 mg total) by mouth daily. 07/23/19  Yes Enzo Bi, MD  aspirin EC 81 MG tablet Take 1 tablet (81 mg total) by mouth daily. 04/26/18   Algernon Huxley, MD  busPIRone (BUSPAR) 5 MG tablet Take 1 tablet (5 mg total) by mouth at bedtime as needed (anxiety). 07/23/19   Enzo Bi, MD  feeding supplement, ENSURE ENLIVE, (ENSURE ENLIVE) LIQD Take 237 mLs by mouth 2 (two) times daily between meals. Or equivalent Boosts. 07/23/19   Enzo Bi, MD  Magnesium Oxide, Antacid, 500 MG CAPS Take 500 mg by mouth daily as needed (stomach acid).     [provider]  nitroGLYCERIN (NITROSTAT) 0.4 MG SL tablet Place 1 tablet (0.4 mg total) under  the tongue every 5 (five) minutes as needed for chest pain. 10/18/15   Arlis Porta., MD  Potassium 99 MG TABS Take 99 mg by mouth daily.     [provider]  torsemide (DEMADEX) 20 MG tablet Take 1 tablet (20 mg total) by mouth daily. (take 1 additional tablet (20mg ) by mouth daily as needed for excess weight) 07/23/19 08/22/19  Enzo Bi, MD    Physical Exam: Vitals:   09/09/19 1409 09/09/19 1410  BP: 125/74   Pulse: 77   Resp: 18   Temp: 98.2 F (36.8 C)   TempSrc: Oral   SpO2: 100%   Weight:  68.5 kg      Constitutional: Acutely ill looking, no distress Vitals:   09/09/19 1409 09/09/19 1410  BP: 125/74   Pulse: 77   Resp: 18   Temp: 98.2 F (36.8 C)   TempSrc: Oral   SpO2: 100%   Weight:  68.5 kg   Eyes: PERRL, lids and conjunctivae normal ENMT: Mucous membranes are dry. Posterior pharynx clear of any exudate or lesions.Normal dentition.  Neck: normal, supple, no masses, no thyromegaly Respiratory: clear to auscultation bilaterally, no wheezing, no crackles. Normal respiratory effort. No accessory muscle use.  Cardiovascular: Regular rate and rhythm, no  murmurs / rubs / gallops. No extremity edema. 2+ pedal pulses. No carotid bruits.  Abdomen: no tenderness, no masses palpated. No hepatosplenomegaly. Bowel sounds positive.  Musculoskeletal: no clubbing / cyanosis. No joint deformity upper and lower extremities. Good ROM, no contractures. Normal muscle tone.  Skin: 2 plantar aspect venous ulcers on the right foot and one on the left.  Each linear with clean base, no significant purulent discharge Neurologic: CN 2-12 grossly intact. Sensation intact, DTR normal. Strength 5/5 in all 4.  Psychiatric: Normal judgment and insight. Alert and oriented x 3. Normal mood.     Labs on Admission: I have personally reviewed following labs and imaging studies  CBC: Recent Labs  Lab 09/09/19 1412  WBC 12.7*  NEUTROABS 11.2*  HGB 14.2  HCT 42.1  MCV 88.6  PLT 616   Basic Metabolic Panel: Recent Labs  Lab 09/09/19 1412  NA 138  K 4.5  CL 104  CO2 22  GLUCOSE 204*  BUN 35*  CREATININE 1.68*  CALCIUM 9.0   GFR: Estimated Creatinine Clearance: 34 mL/min (A) (by C-G formula based on SCr of 1.68 mg/dL (H)). Liver Function Tests: Recent Labs  Lab 09/09/19 1412  AST 188*  ALT 97*  ALKPHOS 89  BILITOT 2.0*  PROT 6.5  ALBUMIN 3.0*   No results for input(s): LIPASE, AMYLASE in the last 168 hours. No results for input(s): AMMONIA in the last 168 hours. Coagulation Profile: No results for input(s): INR, PROTIME in the last 168 hours. Cardiac Enzymes: No results for input(s): CKTOTAL, CKMB, CKMBINDEX, TROPONINI in the last 168 hours. BNP (last 3 results) No results for input(s): PROBNP in the last 8760 hours. HbA1C: No results for input(s): HGBA1C in the last 72 hours. CBG: No results for input(s): GLUCAP in the last 168 hours. Lipid Profile: No results for input(s): CHOL, HDL, LDLCALC, TRIG, CHOLHDL, LDLDIRECT in the last 72 hours. Thyroid Function Tests: No results for input(s): TSH, T4TOTAL, FREET4, T3FREE, THYROIDAB in the last  72 hours. Anemia Panel: No results for input(s): VITAMINB12, FOLATE, FERRITIN, TIBC, IRON, RETICCTPCT in the last 72 hours. Urine analysis: No results found for: COLORURINE, APPEARANCEUR, LABSPEC, Alexander, GLUCOSEU, HGBUR, BILIRUBINUR, KETONESUR, PROTEINUR, UROBILINOGEN, NITRITE, LEUKOCYTESUR Sepsis Labs: @  LABRCNTIP(procalcitonin:4,lacticidven:4) ) Recent Results (from the past 240 hour(s))  Aerobic Culture (superficial specimen)     Status: None   Collection Time: 09/02/19  2:20 PM   Specimen: Foot  Result Value Ref Range Status   Specimen Description   Final    FOOT RIGHT Performed at [facility], [address] [place], [place] [address]    Special Requests   Final    NONE Performed at [facility], [address] [place], [place] [address]    Gram Stain   Final    NO WBC SEEN ABUNDANT GRAM NEGATIVE COCCOBACILLI FEW GRAM POSITIVE COCCI Performed at [facility], [place] [address] [place], [place] [address]    Culture   Final    ABUNDANT ENTEROBACTER CLOACAE FEW ENTEROCOCCUS FAECALIS    Report Status 09/06/2019 FINAL  Final   Organism ID, Bacteria ENTEROBACTER CLOACAE  Final   Organism ID, Bacteria ENTEROCOCCUS FAECALIS  Final      Susceptibility   Enterobacter cloacae - MIC*    CEFAZOLIN >=64 RESISTANT Resistant     CEFEPIME <=1 SENSITIVE Sensitive     CEFTAZIDIME <=1 SENSITIVE Sensitive     CEFTRIAXONE <=1 SENSITIVE Sensitive     CIPROFLOXACIN <=0.25 SENSITIVE Sensitive     GENTAMICIN <=1 SENSITIVE Sensitive     IMIPENEM 0.5 SENSITIVE Sensitive     TRIMETH/SULFA <=20 SENSITIVE Sensitive     PIP/TAZO <=4 SENSITIVE Sensitive     * ABUNDANT ENTEROBACTER CLOACAE   Enterococcus faecalis - MIC*    AMPICILLIN <=2 SENSITIVE Sensitive     VANCOMYCIN 1 SENSITIVE Sensitive     GENTAMICIN SYNERGY SENSITIVE Sensitive     * FEW ENTEROCOCCUS FAECALIS     Radiological Exams on Admission: US Venous Img Lower Bilateral  Result Date:  09/09/2019 CLINICAL DATA:  Bilateral lower extremity swelling. EXAM: BILATERAL LOWER EXTREMITY VENOUS DOPPLER ULTRASOUND TECHNIQUE: Gray-scale sonography with compression, as well as color and duplex ultrasound, were performed to evaluate the deep venous system(s) from the level of the common femoral vein through the popliteal and proximal calf veins. COMPARISON:  None. FINDINGS: VENOUS Normal compressibility of the common femoral, superficial femoral, and popliteal veins, as well as the visualized calf veins. Visualized portions of profunda femoral vein and great saphenous vein unremarkable. No filling defects to suggest DVT on grayscale or color Doppler imaging. Doppler waveforms show normal direction of venous flow, normal respiratory plasticity and response to augmentation. Limited views of the contralateral common femoral vein are unremarkable. OTHER Bilateral lower extremity edema is noted. Limitations: none IMPRESSION: 1. No evidence of DVT within the right lower extremity or left lower extremity. 2. Mild diffuse bilateral lower extremity edema. Electronically Signed   By: Virgina Norfolk M.D.   On: 09/09/2019 20:25      Assessment/Plan Principal Problem:   Acute osteomyelitis of right foot (HCC) Active Problems:   Hypertension   Chronic venous insufficiency   Peripheral vascular disease (HCC)   CAD (coronary artery disease)   Anxiety disorder   CKD (chronic kidney disease), stage III   Cellulitis of both feet     #1 right foot osteomyelitis: Involving the great toe.  Also related to patient's vascular ulceration and cellulitis.  Patient will be admitted initiated Vanco and cefepime.  Vascular consult.  Blood cultures to be obtained.  Further decision by surgery.  #2 bilateral vascular ulcers with cellulitis: Secondary to peripheral vascular disease.  Dr. Lucky Howard to be consulted for further evaluation and treatment.  #3  peripheral vascular disease: Again continue per vascular.  #4  coronary artery disease: Appears compensated.  Continue monitoring.  #5 chronic kidney disease stage 3: Appears at baseline.  #6 anxiety disorder: Confirm and resume home regimen.    DVT prophylaxis: Lovenox Code Status: Full code Family Communication: No family at bedside Disposition Plan: Home Consults called: Dr. Lucky Howard vascular surgery please notify in the morning Admission status: Inpatient  Severity of Illness: The appropriate patient status for this patient is INPATIENT. Inpatient status is judged to be reasonable and necessary in order to provide the required intensity of service to ensure the patient's safety. The patient's presenting symptoms, physical exam findings, and initial radiographic and laboratory data in the context of their chronic comorbidities is felt to place them at high risk for further clinical deterioration. Furthermore, it is not anticipated that the patient will be medically stable for discharge from the hospital within 2 midnights of admission. The following factors support the patient status of inpatient.   " The patient's presenting symptoms include bilateral feet ulcers. " The worrisome physical exam findings include deep pulses bilaterally clean with wide base. " The initial radiographic and laboratory data are worrisome because of osteomyelitis of the first toe on the right. " The chronic co-morbidities include peripheral vascular disease.   * I certify that at the point of admission it is my clinical judgment that the patient will require inpatient hospital care spanning beyond 2 midnights from the point of admission due to high intensity of service, high risk for further deterioration and high frequency of surveillance required.Barbette Merino MD [facility] Pager [phone]  If 7PM-7AM, please contact night-coverage [email or web address] Password [facility]  09/09/2019, 8:28 PM

## 2019-09-09 NOTE — ED Notes (Signed)
Pt states blue lips are normal for him for hte last 6 months

## 2019-09-09 NOTE — Telephone Encounter (Signed)
Called patient who called to say he has no apatite. He says he just doesn't feel like eating. He states that he has a lot of pain to both feet. He says they are red but he can't see them well. He has been to a wound doctor and had work done on an ulcer. He states his legs are swollen to the thigh today but that the left leg swelling has gone down. He is in bed. He states that he sees avascular surgeon and has had leg wraps for several weeks but not now he missed his appointment. He denies fever. He states he is taking nothing for pain. Per protocol patient is to go to ER Ambulance transfer was refused. He will call a friend who dries a cab. Care advice read to patient.  Call ended for patient to call friend. Returned call to patient and he states his friend is outside trying to call him. Patient was instructed to call office if he needs any additional help.  Reason for Disposition . Unable to walk . Patient sounds very sick or weak to the triager  Answer Assessment - Initial Assessment Questions 1. ONSET: "When did the pain start?"      After wound clinic 2. LOCATION: "Where is the pain located?"     Rt foot Left foot  3. PAIN: "How bad is the pain?"    (Scale 1-10; or mild, moderate, severe)   -  MILD (1-3): doesn't interfere with normal activities    -  MODERATE (4-7): interferes with normal activities (e.g., work or school) or awakens from sleep, limping    -  SEVERE (8-10): excruciating pain, unable to do any normal activities, unable to walk    12  4. WORK OR EXERCISE: "Has there been any recent work or exercise that involved this part of the body?"      Vascular causes 5. CAUSE: "What do you think is causing the leg pain?"    Poor circulation 6. OTHER SYMPTOMS: "Do you have any other symptoms?" (e.g., chest pain, back pain, breathing difficulty, swelling, rash, fever, numbness, weakness)   Red foot, Swelling in legs  7. PREGNANCY: "Is there any chance you are pregnant?" "When was your  last menstrual period?"    N/A  Answer Assessment - Initial Assessment Questions 1. ONSET: "When did the swelling start?" (e.g., minutes, hours, days)     6 months 2. LOCATION: "What part of the leg is swollen?"  "Are both legs swollen or just one leg?"     Both rt greater than left 3. SEVERITY: "How bad is the swelling?" (e.g., localized; mild, moderate, severe)  - Localized - small area of swelling localized to one leg  - MILD pedal edema - swelling limited to foot and ankle, pitting edema < 1/4 inch (6 mm) deep, rest and elevation eliminate most or all swelling  - MODERATE edema - swelling of lower leg to knee, pitting edema > 1/4 inch (6 mm) deep, rest and elevation only partially reduce swelling  - SEVERE edema - swelling extends above knee, facial or hand swelling present    Severe to thigh 4. REDNESS: "Does the swelling look red or infected?"    Red feet 5. PAIN: "Is the swelling painful to touch?" If so, ask: "How painful is it?"   (Scale 1-10; mild, moderate or severe)    12 6. FEVER: "Do you have a fever?" If so, ask: "What is it, how was it measured, and when did it  start?"      no 7. CAUSE: "What do you think is causing the leg swelling?"     circulation 8. MEDICAL HISTORY: "Do you have a history of heart failure, kidney disease, liver failure, or cancer?"    tripple bypass skin ca 9. RECURRENT SYMPTOM: "Have you had leg swelling before?" If so, ask: "When was the last time?" "What happened that time?"     yes 10. OTHER SYMPTOMS: "Do you have any other symptoms?" (e.g., chest pain, difficulty breathing)      no 11. PREGNANCY: "Is there any chance you are pregnant?" "When was your last menstrual period?"      N/A  Protocols used: LEG PAIN-A-AH, LEG SWELLING AND EDEMA-A-AH

## 2019-09-10 ENCOUNTER — Inpatient Hospital Stay: Payer: Medicare Other

## 2019-09-10 LAB — URINALYSIS, COMPLETE (UACMP) WITH MICROSCOPIC
Bacteria, UA: NONE SEEN
Bilirubin Urine: NEGATIVE
Glucose, UA: NEGATIVE mg/dL
Hgb urine dipstick: NEGATIVE
Ketones, ur: NEGATIVE mg/dL
Leukocytes,Ua: NEGATIVE
Nitrite: NEGATIVE
Protein, ur: 100 mg/dL — AB
Specific Gravity, Urine: 1.026 (ref 1.005–1.030)
pH: 5 (ref 5.0–8.0)

## 2019-09-10 LAB — CBC
HCT: 43.6 % (ref 39.0–52.0)
Hemoglobin: 15.1 g/dL (ref 13.0–17.0)
MCH: 30.4 pg (ref 26.0–34.0)
MCHC: 34.6 g/dL (ref 30.0–36.0)
MCV: 87.7 fL (ref 80.0–100.0)
Platelets: 169 10*3/uL (ref 150–400)
RBC: 4.97 MIL/uL (ref 4.22–5.81)
RDW: 17.3 % — ABNORMAL HIGH (ref 11.5–15.5)
WBC: 14.1 10*3/uL — ABNORMAL HIGH (ref 4.0–10.5)
nRBC: 0 % (ref 0.0–0.2)

## 2019-09-10 LAB — COMPREHENSIVE METABOLIC PANEL
ALT: 65 U/L — ABNORMAL HIGH (ref 0–44)
AST: 81 U/L — ABNORMAL HIGH (ref 15–41)
Albumin: 2.2 g/dL — ABNORMAL LOW (ref 3.5–5.0)
Alkaline Phosphatase: 63 U/L (ref 38–126)
Anion gap: 10 (ref 5–15)
BUN: 44 mg/dL — ABNORMAL HIGH (ref 8–23)
CO2: 22 mmol/L (ref 22–32)
Calcium: 8.4 mg/dL — ABNORMAL LOW (ref 8.9–10.3)
Chloride: 107 mmol/L (ref 98–111)
Creatinine, Ser: 1.77 mg/dL — ABNORMAL HIGH (ref 0.61–1.24)
GFR calc Af Amer: 41 mL/min — ABNORMAL LOW (ref >60)
GFR calc non Af Amer: 35 mL/min — ABNORMAL LOW (ref >60)
Glucose, Bld: 179 mg/dL — ABNORMAL HIGH (ref 70–99)
Potassium: 4.9 mmol/L (ref 3.5–5.1)
Sodium: 139 mmol/L (ref 135–145)
Total Bilirubin: 1.3 mg/dL — ABNORMAL HIGH (ref 0.3–1.2)
Total Protein: 4.6 g/dL — ABNORMAL LOW (ref 6.5–8.1)

## 2019-09-10 MED ORDER — TAMSULOSIN HCL 0.4 MG PO CAPS
0.4000 mg | ORAL_CAPSULE | Freq: Every day | ORAL | Status: DC
  Administered 2019-09-10 – 2019-09-16 (×7): 0.4 mg via ORAL
  Filled 2019-09-10 (×7): qty 1

## 2019-09-10 MED ORDER — METOPROLOL TARTRATE 50 MG PO TABS
50.0000 mg | ORAL_TABLET | Freq: Two times a day (BID) | ORAL | Status: DC
  Administered 2019-09-10 – 2019-09-13 (×6): 50 mg via ORAL
  Filled 2019-09-10 (×7): qty 1

## 2019-09-10 MED ORDER — BENAZEPRIL HCL 20 MG PO TABS
40.0000 mg | ORAL_TABLET | Freq: Every day | ORAL | Status: DC
  Administered 2019-09-10 – 2019-09-16 (×6): 40 mg via ORAL
  Filled 2019-09-10 (×7): qty 2

## 2019-09-10 MED ORDER — ATORVASTATIN CALCIUM 10 MG PO TABS
10.0000 mg | ORAL_TABLET | Freq: Every day | ORAL | Status: DC
  Administered 2019-09-10 – 2019-09-16 (×7): 10 mg via ORAL
  Filled 2019-09-10 (×8): qty 1

## 2019-09-10 MED ORDER — ENSURE ENLIVE PO LIQD
237.0000 mL | Freq: Two times a day (BID) | ORAL | Status: DC
  Administered 2019-09-10 – 2019-09-15 (×6): 237 mL via ORAL

## 2019-09-10 MED ORDER — ENOXAPARIN SODIUM 40 MG/0.4ML ~~LOC~~ SOLN
40.0000 mg | SUBCUTANEOUS | Status: DC
  Administered 2019-09-10 – 2019-09-15 (×4): 40 mg via SUBCUTANEOUS
  Filled 2019-09-10 (×5): qty 0.4

## 2019-09-10 MED ORDER — HYDROXYZINE HCL 10 MG PO TABS
10.0000 mg | ORAL_TABLET | Freq: Three times a day (TID) | ORAL | Status: DC | PRN
  Administered 2019-09-11 – 2019-09-16 (×5): 10 mg via ORAL
  Filled 2019-09-10 (×7): qty 1

## 2019-09-10 MED ORDER — GABAPENTIN 100 MG PO CAPS
200.0000 mg | ORAL_CAPSULE | Freq: Every day | ORAL | Status: DC
  Administered 2019-09-10 – 2019-09-16 (×6): 200 mg via ORAL
  Filled 2019-09-10 (×6): qty 2

## 2019-09-10 MED ORDER — GABAPENTIN 100 MG PO CAPS: 100.0000 mg | ORAL_CAPSULE | ORAL | Status: DC

## 2019-09-10 MED ORDER — FINASTERIDE 5 MG PO TABS
5.0000 mg | ORAL_TABLET | Freq: Every day | ORAL | Status: DC
  Administered 2019-09-10 – 2019-09-16 (×7): 5 mg via ORAL
  Filled 2019-09-10 (×7): qty 1

## 2019-09-10 MED ORDER — BACLOFEN 10 MG PO TABS
10.0000 mg | ORAL_TABLET | Freq: Three times a day (TID) | ORAL | Status: DC | PRN
  Filled 2019-09-10: qty 1

## 2019-09-10 MED ORDER — TORSEMIDE 20 MG PO TABS
20.0000 mg | ORAL_TABLET | Freq: Every day | ORAL | Status: DC
  Administered 2019-09-10 – 2019-09-16 (×7): 20 mg via ORAL
  Filled 2019-09-10 (×7): qty 1

## 2019-09-10 MED ORDER — GABAPENTIN 100 MG PO CAPS
100.0000 mg | ORAL_CAPSULE | Freq: Every day | ORAL | Status: DC
  Administered 2019-09-10: 200 mg via ORAL
  Administered 2019-09-11 – 2019-09-12 (×2): 100 mg via ORAL
  Administered 2019-09-13: 200 mg via ORAL
  Administered 2019-09-14: 100 mg via ORAL
  Administered 2019-09-15: 200 mg via ORAL
  Filled 2019-09-10: qty 2
  Filled 2019-09-10: qty 1
  Filled 2019-09-10: qty 2
  Filled 2019-09-10 (×2): qty 1
  Filled 2019-09-10: qty 2

## 2019-09-10 MED ORDER — FLUTICASONE PROPIONATE 50 MCG/ACT NA SUSP
2.0000 | Freq: Every day | NASAL | Status: DC
  Administered 2019-09-12 – 2019-09-16 (×5): 2 via NASAL
  Filled 2019-09-10: qty 16

## 2019-09-10 MED ORDER — TRAMADOL HCL 50 MG PO TABS
50.0000 mg | ORAL_TABLET | Freq: Four times a day (QID) | ORAL | Status: DC | PRN
  Administered 2019-09-10 – 2019-09-15 (×4): 50 mg via ORAL
  Filled 2019-09-10 (×5): qty 1

## 2019-09-10 MED ORDER — BACITRACIN-NEOMYCIN-POLYMYXIN OINTMENT TUBE
TOPICAL_OINTMENT | CUTANEOUS | Status: DC | PRN
  Filled 2019-09-10 (×2): qty 14.17

## 2019-09-10 NOTE — Consult Note (Signed)
[facility] Vascular Consult Note  MRN : [id number]  Randy Howard is a 79 y.o. ([birth year]) male who presents with chief complaint of  Chief Complaint  Patient presents with  . Leg Pain  . Anorexia  .  History of Present Illness: Patient well known to service- bilateral PAD s/p multiple endovascular and open interventions for PVD and nonhealing ulcers/wounds. Most recently seen in office for chronic leg edema, wounds and foot ulcerations. Right ABI: 0.7 range with digit pressures in the 40s. Presents this admission for chronic foot wounds and evidence of early sepsis.  Current Facility-Administered Medications  Medication Dose Route Frequency Provider Last Rate Last Admin  . 0.9 %  sodium chloride infusion   Intravenous Continuous Gala Romney L, MD 100 mL/hr at 09/10/19 0900 New Bag at 09/10/19 0900  . acetaminophen (TYLENOL) tablet 650 mg  650 mg Oral Q6H PRN Elwyn Reach, MD       Or  . acetaminophen (TYLENOL) suppository 650 mg  650 mg Rectal Q6H PRN Elwyn Reach, MD      . atorvastatin (LIPITOR) tablet 10 mg  10 mg Oral Daily Lorella Nimrod, MD      . baclofen (LIORESAL) tablet 10 mg  10 mg Oral TID PRN Lorella Nimrod, MD      . benazepril (LOTENSIN) tablet 40 mg  40 mg Oral Daily Amin, Soundra Pilon, MD      . ceFEPIme (MAXIPIME) 2 g in sodium chloride 0.9 % 100 mL IVPB  2 g Intravenous Q12H Gala Romney L, MD 200 mL/hr at 09/10/19 1222 2 g at 09/10/19 1222  . enoxaparin (LOVENOX) injection 40 mg  40 mg Subcutaneous Q24H Lorella Nimrod, MD      . feeding supplement (ENSURE ENLIVE) (ENSURE ENLIVE) liquid 237 mL  237 mL Oral BID BM Lorella Nimrod, MD      . finasteride (PROSCAR) tablet 5 mg  5 mg Oral Daily Lorella Nimrod, MD      . fluticasone (FLONASE) 50 MCG/ACT nasal spray 2 spray  2 spray Each Nare Daily Lorella Nimrod, MD      . gabapentin (NEURONTIN) capsule 100-200 mg  100-200 mg Oral QHS Lorella Nimrod, MD      . gabapentin (NEURONTIN) capsule  200 mg  200 mg Oral Daily Lorella Nimrod, MD      . hydrOXYzine (ATARAX/VISTARIL) tablet 10 mg  10 mg Oral TID PRN Lorella Nimrod, MD      . metoprolol tartrate (LOPRESSOR) tablet 50 mg  50 mg Oral BID Lorella Nimrod, MD      . morphine 2 MG/ML injection 2 mg  2 mg Intravenous Q2H PRN Elwyn Reach, MD      . neomycin-bacitracin-polymyxin (NEOSPORIN) ointment   Topical PRN Lorella Nimrod, MD      . ondansetron (ZOFRAN) tablet 4 mg  4 mg Oral Q6H PRN Elwyn Reach, MD       Or  . ondansetron (ZOFRAN) injection 4 mg  4 mg Intravenous Q6H PRN Garba, Mohammad L, MD      . sodium chloride flush (NS) 0.9 % injection 3 mL  3 mL Intravenous Once Vanessa Rison, MD      . tamsulosin (FLOMAX) capsule 0.4 mg  0.4 mg Oral Daily Lorella Nimrod, MD      . torsemide (DEMADEX) tablet 20 mg  20 mg Oral Daily Lorella Nimrod, MD      . traMADol (ULTRAM) tablet 50 mg  50 mg Oral Q6H PRN  Lorella Nimrod, MD   50 mg at 09/10/19 1246  . vancomycin (VANCOREADY) IVPB 750 mg/150 mL  750 mg Intravenous Q24H Elwyn Reach, MD        Past Medical History:  Diagnosis Date  . Arthritis   . Coronary artery disease   . Dyspnea   . Elevated lipids   . Hyperlipidemia   . Hypertension   . Neuropathy   . Nocturia   . Obstructive sleep apnea   . Peripheral vascular disease ([place])   . Prostate enlargement   . PVD (peripheral vascular disease) ([place])   . Right carotid bruit   . Skin cancer    Followed by Dr. Evorn Gong  . Skin cancer   . Tremor     Past Surgical History:  Procedure Laterality Date  . CARDIAC CATHETERIZATION    . CAROTID ARTERY ANGIOPLASTY Left 09/30/2011  . CORONARY ARTERY BYPASS GRAFT  2006  . ENDARTERECTOMY FEMORAL Bilateral 11/01/2014   Procedure: ENDARTERECTOMY FEMORAL;  Surgeon: Algernon Huxley, MD;  Location: [facility];  Service: Vascular;  Laterality: Bilateral;  . LOWER EXTREMITY ANGIOGRAPHY Left 04/26/2018   Procedure: LOWER EXTREMITY ANGIOGRAPHY;  Surgeon: Algernon Huxley, MD;  Location: [place] [facility];  Service: Cardiovascular;  Laterality: Left;  . LOWER EXTREMITY ANGIOGRAPHY Right 09/23/2018   Procedure: LOWER EXTREMITY ANGIOGRAPHY;  Surgeon: Algernon Huxley, MD;  Location: [place];  Service: Cardiovascular;  Laterality: Right;  . LOWER EXTREMITY ANGIOGRAPHY Right 11/12/2018   Procedure: LOWER EXTREMITY ANGIOGRAPHY;  Surgeon: Algernon Huxley, MD;  Location: [facility];  Service: Cardiovascular;  Laterality: Right;  . PERIPHERAL VASCULAR CATHETERIZATION Left 10/09/2014   Procedure: Lower Extremity Angiography;  Surgeon: Algernon Huxley, MD;  Location: [facility];  Service: Cardiovascular;  Laterality: Left;  . PERIPHERAL VASCULAR CATHETERIZATION  10/09/2014   Procedure: Lower Extremity Intervention;  Surgeon: Algernon Huxley, MD;  Location: [place] [facility] [facility];  Service: Cardiovascular;;    Social History Social History   Tobacco Use  . Smoking status: Former Smoker    Packs/day: 1.00    Years: 25.00    Pack years: 25.00    Types: Cigarettes    Quit date: 03/24/1985    Years since quitting: 34.4  . Smokeless tobacco: Former Network engineer  . Vaping Use: Never used  Substance Use Topics  . Alcohol use: No    Alcohol/week: 0.0 standard drinks  . Drug use: No    Family History Family History  Problem Relation Age of Onset  . Heart disease Sister   . Heart disease Sister   . Diabetes Sister   . Diabetes Sister   . Diabetes Brother     Allergies  Allergen Reactions  . Oxycodone Nausea Only     REVIEW OF SYSTEMS (Negative unless checked) Decreased appetite. Constitutional: [] Weight loss  [] Fever  [] Chills Cardiac: [] Chest pain   [] Chest pressure   [] Palpitations   [] Shortness of breath when laying flat   [] Shortness of breath at rest   [] Shortness of breath with exertion. Vascular:  [] Pain in legs with walking   [] Pain in legs at rest   [] Pain in legs when laying flat   [] Claudication   [] Pain in feet when walking  [x] Pain in feet at  rest  [] Pain in feet when laying flat   [] History of DVT   [] Phlebitis   [x] Swelling in legs   [] Varicose veins   [x] Non-healing ulcers Pulmonary:   [] Uses home  oxygen   [] Productive cough   [] Hemoptysis   [] Wheeze  [] COPD   [] Asthma Neurologic:  [] Dizziness  [] Blackouts   [] Seizures   [] History of stroke   [] History of TIA  [] Aphasia   [] Temporary blindness   [] Dysphagia   [] Weakness or numbness in arms   [] Weakness or numbness in legs Musculoskeletal:  [] Arthritis   [] Joint swelling   [] Joint pain   [] Low back pain; pain in bilateral feet Hematologic:  [] Easy bruising  [] Easy bleeding   [] Hypercoagulable state   [] Anemic  [] Hepatitis Gastrointestinal:  [] Blood in stool   [] Vomiting blood  [] Gastroesophageal reflux/heartburn   [] Difficulty swallowing. Genitourinary:  [] Chronic kidney disease   [] Difficult urination  [] Frequent urination  [] Burning with urination   [] Blood in urine Skin:  [] Rashes   [x] Ulcers   [] Wounds Psychological:  [] History of anxiety   []  History of major depression.  Physical Examination  Vitals:   09/09/19 2316 09/10/19 0415 09/10/19 0725 09/10/19 1146  BP: 133/86 125/77 104/61 109/66  Pulse: 96 80 70 71  Resp: 16 16 16 16   Temp: 97.8 F (36.6 C) 97.6 F (36.4 C) 98.8 F (37.1 C) 98.2 F (36.8 C)  TempSrc: Oral Oral    SpO2: 98% 99% 97% 100%  Weight: 77.9 kg     Height: 5\' 11"  (1.803 m)      Body mass index is 23.95 kg/m. Gen:  WD/WN, NAD Pulmonary:  Good air movement, respirations not labored, equal bilaterally.  Cardiac: RRR, normal S1, S2. Vascular:  Vessel Right Left  Radial Palpable Palpable                  Femoral Palpable Palpable      DP/PT Not Palpable Not Palpable       Gastrointestinal: soft, non-tender/non-distended. No guarding/reflex.  Musculoskeletal: M/S 5/5 throughout.   No deformity or atrophy. Edema. Scabbing, ulcerations, erythema bilateral lower extremities. Feet bandaged. Neurologic: Sensation grossly intact in extremities.   Symmetrical.  Speech is fluent. Psychiatric: Judgment intact, Mood & affect appropriate for pt's clinical situation.       CBC Lab Results  Component Value Date   WBC 14.1 (H) 09/10/2019   HGB 15.1 09/10/2019   HCT 43.6 09/10/2019   MCV 87.7 09/10/2019   PLT 169 09/10/2019    BMET    Component Value Date/Time   NA 139 09/10/2019 0520   NA 142 07/30/2013 0000   NA 141 01/05/2012 0724   K 4.9 09/10/2019 0520   K 3.8 01/05/2012 0724   CL 107 09/10/2019 0520   CL 106 01/05/2012 0724   CO2 22 09/10/2019 0520   CO2 28 01/05/2012 0724   GLUCOSE 179 (H) 09/10/2019 0520   GLUCOSE 152 (H) 01/05/2012 0724   BUN 44 (H) 09/10/2019 0520   BUN 15 07/30/2013 0000   BUN 24 (H) 01/05/2012 0724   CREATININE 1.77 (H) 09/10/2019 0520   CREATININE 1.27 (H) 10/25/2018 0917   CALCIUM 8.4 (L) 09/10/2019 0520   CALCIUM 9.0 01/05/2012 0724   GFRNONAA 35 (L) 09/10/2019 0520   GFRNONAA 53 (L) 10/25/2018 0917   GFRAA 41 (L) 09/10/2019 0520   GFRAA 62 10/25/2018 0917   Estimated Creatinine Clearance: 35.5 mL/min (A) (by C-G formula based on SCr of 1.77 mg/dL (H)).  COAG Lab Results  Component Value Date   INR 1.5 (H) 07/21/2019   INR 1.21 10/24/2014    Radiology MR FOOT RIGHT WO CONTRAST  Result Date: 09/10/2019 CLINICAL DATA:  Chronic soft tissue ulcerations  of the foot. EXAM: MRI OF THE RIGHT FOREFOOT WITHOUT CONTRAST TECHNIQUE: Multiplanar, multisequence MR imaging of the right forefoot was performed. No intravenous contrast was administered. COMPARISON:  Radiographs dated 09/01/2019 FINDINGS: Bones/Joint/Cartilage There is osteomyelitis of the distal phalanx of the great toe with focal destruction of a portion of the tuft of the distal phalanx. No other significant bone abnormality. Muscles and Tendons No significant abnormalities. Soft tissues Nonspecific slight edema on the dorsum of the foot as well as slight edema in the subcutaneous fat of the plantar aspect of the foot. No discrete  soft tissue abscesses. 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 Electronically Signed   By: Lorriane Shire M.D.   On: 09/10/2019 14:20   MR FOOT LEFT WO CONTRAST  Result Date: 09/10/2019 CLINICAL DATA:  Soft tissue ulcerations of the left foot. EXAM: MRI OF THE LEFT FOREFOOT WITHOUT CONTRAST TECHNIQUE: Multiplanar, multisequence MR imaging of the left midfoot and forefoot was performed. No intravenous contrast was administered. COMPARISON:  Radiographs dated 09/01/2019 FINDINGS: Bones/Joint/Cartilage There is abnormal edema in the head of the third metatarsal and the base of the proximal phalanx of the third toe. No discrete bone destruction. There is a deep soft tissue ulceration underlying the head of the third metatarsal. The bones of the midfoot and forefoot are otherwise normal. Muscles and Tendons There is a focal fluid collection surrounding the extensor tendon to the third toe at the level of the distal shaft of the metatarsal, best seen on image 28 of series 4, likely representing an abscess. The fluid collection extends toward the soft tissue ulceration Soft tissues Nonspecific subcutaneous edema in the forefoot. No discrete soft tissue abscesses. IMPRESSION: Osteomyelitis of the head of the third metatarsal and the base of the proximal phalanx of the third toe. Tenosynovitis of the flexor tendon of the third toe as described above. This is likely infectious. Electronically Signed   By: Lorriane Shire M.D.   On: 09/10/2019 14:26   US Venous Img Lower Bilateral  Result Date: 09/09/2019 CLINICAL DATA:  Bilateral lower extremity swelling. EXAM: BILATERAL LOWER EXTREMITY VENOUS DOPPLER ULTRASOUND TECHNIQUE: Gray-scale sonography with compression, as well as color and duplex ultrasound, were performed to evaluate the deep venous system(s) from the level of the common femoral vein through the popliteal and proximal calf veins. COMPARISON:  None. FINDINGS: VENOUS Normal  compressibility of the common femoral, superficial femoral, and popliteal veins, as well as the visualized calf veins. Visualized portions of profunda femoral vein and great saphenous vein unremarkable. No filling defects to suggest DVT on grayscale or color Doppler imaging. Doppler waveforms show normal direction of venous flow, normal respiratory plasticity and response to augmentation. Limited views of the contralateral common femoral vein are unremarkable. OTHER Bilateral lower extremity edema is noted. Limitations: none IMPRESSION: 1. No evidence of DVT within the right lower extremity or left lower extremity. 2. Mild diffuse bilateral lower extremity edema. Electronically Signed   By: Virgina Norfolk M.D.   On: 09/09/2019 20:25   DG Foot Complete Left  Result Date: 09/02/2019 CLINICAL DATA:  Diabetic foot ulcer with osteomyelitis. EXAM: LEFT FOOT - COMPLETE 3+ VIEW COMPARISON:  None. FINDINGS: Diffuse soft tissue swelling, most pronounced dorsally and distally. Small superficial soft tissue defects lateral to the 5th MTP joint. No soft tissue gas, bone destruction or periosteal reaction. Moderate inferior calcaneal spur formation IMPRESSION: Soft tissue ulceration lateral to the 5th MTP joint without radiographic evidence of underlying osteomyelitis. Electronically Signed  By: Claudie Revering M.D.   On: 09/02/2019 15:06   DG Foot Complete Right  Result Date: 09/02/2019 CLINICAL DATA:  Diabetic foot ulcer with osteomyelitis. EXAM: RIGHT FOOT COMPLETE - 3+ VIEW COMPARISON:  None. FINDINGS: Diffuse soft tissue swelling, most pronounced distally. Small soft tissue defect in the distal aspect of the great toe. Ill-defined bone destruction and fragmentation involving the 1st distal tuft. No soft tissue gas or fracture. Minimal 1st MTP joint degenerative changes. Mild inferior and mild moderate posterior calcaneal spur formation. IMPRESSION: 1. Osteomyelitis involving the 1st distal tuft. These results will  be called to the ordering clinician or representative by the Radiologist Assistant, and communication documented in the PACS or [employer]. 2. Diffuse soft tissue swelling with a small ulceration in the distal aspect of the great toe. Electronically Signed   By: Claudie Revering M.D.   On: 09/02/2019 15:09      Assessment/Plan  Chronic non healing foot wounds.  Given his age and comorbidities, he would be high risk for any further procedures.  However, we will consider angiogram on Monday. Foot wounds per Podiatry. UNNA boots to bilateral lower extremities.  Evaristo Bury, MD  09/10/2019 3:21 PM    This note was created with Dragon medical transcription system.  Any error is purely unintentional

## 2019-09-10 NOTE — Progress Notes (Signed)
PROGRESS NOTE    Randy Howard  WPY:[id number] DOB: 08/30/1940 DOA: 09/09/2019 PCP: Olin [place], DO   Brief Narrative:  Randy Howard is a 79 y.o. male with medical history significant of coronary artery disease, hypertension, hyperlipidemia, peripheral neuropathy, peripheral vascular disease, BPH, history of skin cancer, history of stent placement, history of carotid artery disease who is being followed by vascular surgery to continue presenting to the ER today with bilateral feet ulcers.  Patient was known to have vascular insufficiency as well as venous stasis dermatitis.  It appears he has multiple venous ulcers bilaterally.  He has pain in his feet bilaterally. Imaging of both feet shows osteomyelitis on right foot with visible ulceration and cellulitis of left foot.  Podiatry was consulted and he was admitted with broad-spectrum antibiotics.  Subjective: Patient continued to complain about bilateral lower extremity pain, right more than left.  Is also asking for some local ointment for his lower leg where a blister was ruptured recently.  Assessment & Plan:   Principal Problem:   Acute osteomyelitis of right foot (HCC) Active Problems:   Hypertension   Chronic venous insufficiency   Peripheral vascular disease (HCC)   CAD (coronary artery disease)   Anxiety disorder   CKD (chronic kidney disease), stage III   Cellulitis of both feet  right foot osteomyelitis: Involving the great toe.  Also related to patient's vascular ulceration and cellulitis.  Patient has a recent wound culture done at wound clinic which was positive for Enterobacter cloacae and Enterococcus faecalis.  He was placed on cefepime and vancomycin on admission which will cover both of those bacteria along with Pseudomonas. -Podiatry was consulted-we will appreciate their recommendations.  According to Dr. Sammuel Bailiff note he will need amputation.  They will consult vascular surgery before due to poor  circulation and to prevent future extension of amputation or complications. -Continue cefepime and vancomycin.  Severe peripheral arterial disease.  Patient has very weak peripheral pulses.  Has an history of bilateral vascular ulcers and cellulitis. Vascular surgery was also consulted on admission-he will need a vascular procedure before amputation.  We will appreciate their recommendations.  CAD.  No acute concern. -Continue to monitor and home meds.  AKI with CKD stage IIIa.  Likley prerenal.  Slight worsening of creatinine this morning. -Give him some IV fluid and monitor renal function. -Avoid nephrotoxins  History of anxiety disorder. -Continue home meds.  Objective: Vitals:   09/09/19 2316 09/10/19 0415 09/10/19 0725 09/10/19 1146  BP: 133/86 125/77 104/61 109/66  Pulse: 96 80 70 71  Resp: 16 16 16 16   Temp: 97.8 F (36.6 C) 97.6 F (36.4 C) 98.8 F (37.1 C) 98.2 F (36.8 C)  TempSrc: Oral Oral    SpO2: 98% 99% 97% 100%  Weight: 77.9 kg     Height: 5\' 11"  (1.803 m)       Intake/Output Summary (Last 24 hours) at 09/10/2019 1348 Last data filed at 09/10/2019 0539 Gross per 24 hour  Intake 909.36 ml  Output --  Net 909.36 ml   Filed Weights   09/09/19 1410 09/09/19 2316  Weight: 68.5 kg 77.9 kg    Examination:  General exam: Chronically ill-appearing elderly man, appears calm and comfortable  Respiratory system: Clear to auscultation. Respiratory effort normal. Cardiovascular system: S1 & S2 heard, RRR. No JVD, murmurs, rubs, Gastrointestinal system: Soft, nontender, nondistended, bowel sounds positive. Central nervous system: Alert and oriented. No focal neurological deficits.Symmetric 5 x 5 power. Extremities: Bilateral 2+  lower extremity edema with signs of chronic venous stasis and congestion.  Bilateral scaling and a small area of raw skin where blister ruptured on right medial extremity.  Both feet with clean bandages. Psychiatry: Judgement and insight  appear normal.   DVT prophylaxis: Lovenox Code Status: Full Family Communication: Discussed with patient Disposition Plan:  Status is: Inpatient  Remains inpatient appropriate because:Inpatient level of care appropriate due to severity of illness   Dispo: The patient is from: Home              Anticipated d/c is to: To be determined              Anticipated d/c date is: > 3 days              Patient currently is not medically stable to d/c.  Patient will need a transmetatarsal amputation after vascular procedure.  Consultants:   Podiatry  Vascular surgery  Procedures:  Antimicrobials:  Cefepime Vancomycin  Data Reviewed: I have personally reviewed following labs and imaging studies  CBC: Recent Labs  Lab 09/09/19 1412 09/10/19 0520  WBC 12.7* 14.1*  NEUTROABS 11.2*  --   HGB 14.2 15.1  HCT 42.1 43.6  MCV 88.6 87.7  PLT 196 976   Basic Metabolic Panel: Recent Labs  Lab 09/09/19 1412 09/10/19 0520  NA 138 139  K 4.5 4.9  CL 104 107  CO2 22 22  GLUCOSE 204* 179*  BUN 35* 44*  CREATININE 1.68* 1.77*  CALCIUM 9.0 8.4*   GFR: Estimated Creatinine Clearance: 35.5 mL/min (A) (by C-G formula based on SCr of 1.77 mg/dL (H)). Liver Function Tests: Recent Labs  Lab 09/09/19 1412 09/10/19 0520  AST 188* 81*  ALT 97* 65*  ALKPHOS 89 63  BILITOT 2.0* 1.3*  PROT 6.5 4.6*  ALBUMIN 3.0* 2.2*   No results for input(s): LIPASE, AMYLASE in the last 168 hours. No results for input(s): AMMONIA in the last 168 hours. Coagulation Profile: No results for input(s): INR, PROTIME in the last 168 hours. Cardiac Enzymes: No results for input(s): CKTOTAL, CKMB, CKMBINDEX, TROPONINI in the last 168 hours. BNP (last 3 results) No results for input(s): PROBNP in the last 8760 hours. HbA1C: No results for input(s): HGBA1C in the last 72 hours. CBG: No results for input(s): GLUCAP in the last 168 hours. Lipid Profile: No results for input(s): CHOL, HDL, LDLCALC, TRIG,  CHOLHDL, LDLDIRECT in the last 72 hours. Thyroid Function Tests: No results for input(s): TSH, T4TOTAL, FREET4, T3FREE, THYROIDAB in the last 72 hours. Anemia Panel: No results for input(s): VITAMINB12, FOLATE, FERRITIN, TIBC, IRON, RETICCTPCT in the last 72 hours. Sepsis Labs: Recent Labs  Lab 09/09/19 1412 09/09/19 2006  LATICACIDVEN 2.8* 2.5*    Recent Results (from the past 240 hour(s))  Aerobic Culture (superficial specimen)     Status: None   Collection Time: 09/02/19  2:20 PM   Specimen: Foot  Result Value Ref Range Status   Specimen Description   Final    FOOT RIGHT Performed at [facility], [address], [place], [place] [address]    Special Requests   Final    NONE Performed at [facility], [address] [place], [place] [address]    Gram Stain   Final    NO WBC SEEN ABUNDANT GRAM NEGATIVE COCCOBACILLI FEW GRAM POSITIVE COCCI Performed at [facility], [place] [address] [place],  [address]    Culture   Final  ABUNDANT ENTEROBACTER CLOACAE FEW ENTEROCOCCUS FAECALIS    Report Status 09/06/2019 FINAL  Final   Organism ID, Bacteria ENTEROBACTER CLOACAE  Final   Organism ID, Bacteria ENTEROCOCCUS FAECALIS  Final      Susceptibility   Enterobacter cloacae - MIC*    CEFAZOLIN >=64 RESISTANT Resistant     CEFEPIME <=1 SENSITIVE Sensitive     CEFTAZIDIME <=1 SENSITIVE Sensitive     CEFTRIAXONE <=1 SENSITIVE Sensitive     CIPROFLOXACIN <=0.25 SENSITIVE Sensitive     GENTAMICIN <=1 SENSITIVE Sensitive     IMIPENEM 0.5 SENSITIVE Sensitive     TRIMETH/SULFA <=20 SENSITIVE Sensitive     PIP/TAZO <=4 SENSITIVE Sensitive     * ABUNDANT ENTEROBACTER CLOACAE   Enterococcus faecalis - MIC*    AMPICILLIN <=2 SENSITIVE Sensitive     VANCOMYCIN 1 SENSITIVE Sensitive     GENTAMICIN SYNERGY SENSITIVE Sensitive     * FEW ENTEROCOCCUS FAECALIS  Blood culture (routine x 2)     Status: None (Preliminary result)   Collection Time: 09/09/19   8:06 PM   Specimen: BLOOD  Result Value Ref Range Status   Specimen Description BLOOD LEFT ANTECUBITAL  Final   Special Requests   Final    BOTTLES DRAWN AEROBIC AND ANAEROBIC Blood Culture results may not be optimal due to an inadequate volume of blood received in culture bottles   Culture   Final    NO GROWTH < 12 HOURS Performed at [facility], [address] [place], [place] [address]    Report Status PENDING  Incomplete  Blood culture (routine x 2)     Status: None (Preliminary result)   Collection Time: 09/09/19  8:06 PM   Specimen: BLOOD  Result Value Ref Range Status   Specimen Description BLOOD LEFT HAND  Final   Special Requests   Final    BOTTLES DRAWN AEROBIC AND ANAEROBIC Blood Culture adequate volume   Culture   Final    NO GROWTH < 12 HOURS Performed at [facility], [address], [place], [place] [address]    Report Status PENDING  Incomplete  SARS Coronavirus 2 by RT PCR (hospital order, performed in [place] hospital lab) Nasopharyngeal Nasopharyngeal Swab     Status: None   Collection Time: 09/09/19 10:15 PM   Specimen: Nasopharyngeal Swab  Result Value Ref Range Status   SARS Coronavirus 2 NEGATIVE NEGATIVE Final    Comment: (NOTE) SARS-CoV-2 target nucleic acids are NOT DETECTED.  The SARS-CoV-2 RNA is generally detectable in upper and lower respiratory specimens during the acute phase of infection. The lowest concentration of SARS-CoV-2 viral copies this assay can detect is 250 copies / mL. A negative result does not preclude SARS-CoV-2 infection and should not be used as the sole basis for treatment or other patient management decisions.  A negative result may occur with improper specimen collection / handling, submission of specimen other than nasopharyngeal swab, presence of viral mutation(s) within the areas targeted by this assay, and inadequate number of viral copies (<250 copies / mL). A negative result must be combined  with clinical observations, patient history, and epidemiological information.  Fact Sheet for Patients:   StrictlyIdeas.no  Fact Sheet for Healthcare Providers: BankingDealers.co.za  This test is not yet approved or  cleared by the [place] FDA and has been authorized for detection and/or diagnosis of SARS-CoV-2 by FDA under an Emergency Use Authorization (EUA).  This EUA will remain in effect (meaning this test can be  used) for the duration of the COVID-19 declaration under Section 564(b)(1) of the Act, 21 U.S.C. section 360bbb-3(b)(1), unless the authorization is terminated or revoked sooner.  Performed at [facility], [address], [place], [place] [address]      Radiology Studies: US Venous Img Lower Bilateral  Result Date: 09/09/2019 CLINICAL DATA:  Bilateral lower extremity swelling. EXAM: BILATERAL LOWER EXTREMITY VENOUS DOPPLER ULTRASOUND TECHNIQUE: Gray-scale sonography with compression, as well as color and duplex ultrasound, were performed to evaluate the deep venous system(s) from the level of the common femoral vein through the popliteal and proximal calf veins. COMPARISON:  None. FINDINGS: VENOUS Normal compressibility of the common femoral, superficial femoral, and popliteal veins, as well as the visualized calf veins. Visualized portions of profunda femoral vein and great saphenous vein unremarkable. No filling defects to suggest DVT on grayscale or color Doppler imaging. Doppler waveforms show normal direction of venous flow, normal respiratory plasticity and response to augmentation. Limited views of the contralateral common femoral vein are unremarkable. OTHER Bilateral lower extremity edema is noted. Limitations: none IMPRESSION: 1. No evidence of DVT within the right lower extremity or left lower extremity. 2. Mild diffuse bilateral lower extremity edema. Electronically Signed   By: Virgina Norfolk M.D.    On: 09/09/2019 20:25    Scheduled Meds: . heparin  5,000 Units Subcutaneous Q8H  . sodium chloride flush  3 mL Intravenous Once   Continuous Infusions: . sodium chloride 100 mL/hr at 09/10/19 0900  . ceFEPime (MAXIPIME) IV 2 g (09/10/19 1222)  . vancomycin       LOS: 1 day   Time spent: 45 minutes.  Lorella Nimrod, MD [facility]  If 7PM-7AM, please contact night-coverage [email or web address]  09/10/2019, 1:48 PM   This record has been created using Systems analyst. Errors have been sought and corrected,but may not always be located. Such creation errors do not reflect on the standard of care.

## 2019-09-10 NOTE — Progress Notes (Signed)
Brief Pharmacy Note  Pharmacy consult for Lovenox for VTE prophylaxis. Order for 40 mg SQ q24h.  Dorena Bodo, PharmD

## 2019-09-10 NOTE — Consult Note (Signed)
Reason for Consult: Multiple ulcerations with osteomyelitis right great toe Referring Physician: Taj Nevins is an 79 y.o. male.  HPI: This is a 79 year old male with significant history of peripheral vascular disease with multiple previous interventions from vascular surgery.  Chronic ulcerations on both feet and has been managed recently by the wound care center.  Presented to the hospital yesterday with loss of appetite and increased signs of infection with difficulty walking.  Past Medical History:  Diagnosis Date  . Arthritis   . Coronary artery disease   . Dyspnea   . Elevated lipids   . Hyperlipidemia   . Hypertension   . Neuropathy   . Nocturia   . Obstructive sleep apnea   . Peripheral vascular disease ([place])   . Prostate enlargement   . PVD (peripheral vascular disease) ([place])   . Right carotid bruit   . Skin cancer    Followed by Dr. Evorn Gong  . Skin cancer   . Tremor     Past Surgical History:  Procedure Laterality Date  . CARDIAC CATHETERIZATION    . CAROTID ARTERY ANGIOPLASTY Left 09/30/2011  . CORONARY ARTERY BYPASS GRAFT  2006  . ENDARTERECTOMY FEMORAL Bilateral 11/01/2014   Procedure: ENDARTERECTOMY FEMORAL;  Surgeon: Algernon Huxley, MD;  Location: [facility] OR[facility];  Service: Vascular;  Laterality: Bilateral;  . LOWER EXTREMITY ANGIOGRAPHY Left 04/26/2018   Procedure: LOWER EXTREMITY ANGIOGRAPHY;  Surgeon: Algernon Huxley, MD;  Location: [place] [facility];  Service: Cardiovascular;  Laterality: Left;  . LOWER EXTREMITY ANGIOGRAPHY Right 09/23/2018   Procedure: LOWER EXTREMITY ANGIOGRAPHY;  Surgeon: Algernon Huxley, MD;  Location: [place] [facility];  Service: Cardiovascular;  Laterality: Right;  . LOWER EXTREMITY ANGIOGRAPHY Right 11/12/2018   Procedure: LOWER EXTREMITY ANGIOGRAPHY;  Surgeon: Algernon Huxley, MD;  Location: [place] [facility];  Service: Cardiovascular;  Laterality: Right;  . PERIPHERAL VASCULAR CATHETERIZATION Left 10/09/2014   Procedure: Lower Extremity  Angiography;  Surgeon: Algernon Huxley, MD;  Location: [place] [facility];  Service: Cardiovascular;  Laterality: Left;  . PERIPHERAL VASCULAR CATHETERIZATION  10/09/2014   Procedure: Lower Extremity Intervention;  Surgeon: Algernon Huxley, MD;  Location: [place] [facility];  Service: Cardiovascular;;    Family History  Problem Relation Age of Onset  . Heart disease Sister   . Heart disease Sister   . Diabetes Sister   . Diabetes Sister   . Diabetes Brother     Social History:  reports that he quit smoking about 34 years ago. His smoking use included cigarettes. He has a 25.00 pack-year smoking history. He has quit using smokeless tobacco. He reports that he does not drink alcohol and does not use drugs.  Allergies:  Allergies  Allergen Reactions  . Oxycodone Nausea Only    Medications:  Scheduled: . heparin  5,000 Units Subcutaneous Q8H  . sodium chloride flush  3 mL Intravenous Once    Results for orders placed or performed during the hospital encounter of 09/09/19 (from the past 48 hour(s))  Lactic acid, plasma     Status: Abnormal   Collection Time: 09/09/19  2:12 PM  Result Value Ref Range   Lactic Acid, Venous 2.8 (HH) 0.5 - 1.9 mmol/L    Comment: CRITICAL RESULT CALLED TO, READ BACK BY AND VERIFIED WITH TIFFANY JOHNSON 09/09/19 1453 [facility] Performed at [facility], [address] [place] [address], [place], [place] [address]   Comprehensive metabolic panel     Status: Abnormal   Collection  Time: 09/09/19  2:12 PM  Result Value Ref Range   Sodium 138 135 - 145 mmol/L   Potassium 4.5 3.5 - 5.1 mmol/L   Chloride 104 98 - 111 mmol/L   CO2 22 22 - 32 mmol/L   Glucose, Bld 204 (H) 70 - 99 mg/dL    Comment: Glucose reference range applies only to samples taken after fasting for at least 8 hours.   BUN 35 (H) 8 - 23 mg/dL   Creatinine, Ser 1.68 (H) 0.61 - 1.24 mg/dL   Calcium 9.0 8.9 - 10.3 mg/dL   Total Protein 6.5 6.5 - 8.1 g/dL   Albumin 3.0 (L) 3.5 - 5.0 g/dL   AST 188 (H) 15 -  41 U/L   ALT 97 (H) 0 - 44 U/L   Alkaline Phosphatase 89 38 - 126 U/L   Total Bilirubin 2.0 (H) 0.3 - 1.2 mg/dL   GFR calc non Af Amer 38 (L) >60 mL/min   GFR calc Af Amer 44 (L) >60 mL/min   Anion gap 12 5 - 15    Comment: Performed at [facility], [address] [place], [place] [address]  CBC with Differential     Status: Abnormal   Collection Time: 09/09/19  2:12 PM  Result Value Ref Range   WBC 12.7 (H) 4.0 - 10.5 K/uL   RBC 4.75 4.22 - 5.81 MIL/uL   Hemoglobin 14.2 13.0 - 17.0 g/dL   HCT 42.1 39 - 52 %   MCV 88.6 80.0 - 100.0 fL   MCH 29.9 26.0 - 34.0 pg   MCHC 33.7 30.0 - 36.0 g/dL   RDW 17.1 (H) 11.5 - 15.5 %   Platelets 196 150 - 400 K/uL   nRBC 0.0 0.0 - 0.2 %   Neutrophils Relative % 87 %   Neutro Abs 11.2 (H) 1.7 - 7.7 K/uL   Lymphocytes Relative 7 %   Lymphs Abs 0.8 0.7 - 4.0 K/uL   Monocytes Relative 5 %   Monocytes Absolute 0.6 0 - 1 K/uL   Eosinophils Relative 0 %   Eosinophils Absolute 0.0 0 - 0 K/uL   Basophils Relative 0 %   Basophils Absolute 0.0 0 - 0 K/uL   Immature Granulocytes 1 %   Abs Immature Granulocytes 0.06 0.00 - 0.07 K/uL    Comment: Performed at [facility], [address] [place], [place] [address]  Lactic acid, plasma     Status: Abnormal   Collection Time: 09/09/19  8:06 PM  Result Value Ref Range   Lactic Acid, Venous 2.5 (HH) 0.5 - 1.9 mmol/L    Comment: CRITICAL VALUE NOTED. VALUE IS CONSISTENT WITH PREVIOUSLY REPORTED/CALLED VALUE [facility] Performed at [facility], [address] [place], [place] [address]   Blood culture (routine x 2)     Status: None (Preliminary result)   Collection Time: 09/09/19  8:06 PM   Specimen: BLOOD  Result Value Ref Range   Specimen Description BLOOD LEFT ANTECUBITAL    Special Requests      BOTTLES DRAWN AEROBIC AND ANAEROBIC Blood Culture results may not be optimal due to an inadequate volume of blood received in culture bottles   Culture      NO GROWTH < 12  HOURS Performed at [facility], [address] [place], [place] [address]    Report Status PENDING   Blood culture (routine x 2)     Status: None (Preliminary result)   Collection Time: 09/09/19  8:06  PM   Specimen: BLOOD  Result Value Ref Range   Specimen Description BLOOD LEFT HAND    Special Requests      BOTTLES DRAWN AEROBIC AND ANAEROBIC Blood Culture adequate volume   Culture      NO GROWTH < 12 HOURS Performed at [facility], [address], [place], [place] [address]    Report Status PENDING   SARS Coronavirus 2 by RT PCR (hospital order, performed in [facility] hospital lab) Nasopharyngeal Nasopharyngeal Swab     Status: None   Collection Time: 09/09/19 10:15 PM   Specimen: Nasopharyngeal Swab  Result Value Ref Range   SARS Coronavirus 2 NEGATIVE NEGATIVE    Comment: (NOTE) SARS-CoV-2 target nucleic acids are NOT DETECTED.  The SARS-CoV-2 RNA is generally detectable in upper and lower respiratory specimens during the acute phase of infection. The lowest concentration of SARS-CoV-2 viral copies this assay can detect is 250 copies / mL. A negative result does not preclude SARS-CoV-2 infection and should not be used as the sole basis for treatment or other patient management decisions.  A negative result may occur with improper specimen collection / handling, submission of specimen other than nasopharyngeal swab, presence of viral mutation(s) within the areas targeted by this assay, and inadequate number of viral copies (<250 copies / mL). A negative result must be combined with clinical observations, patient history, and epidemiological information.  Fact Sheet for Patients:   StrictlyIdeas.no  Fact Sheet for Healthcare Providers: BankingDealers.co.za  This test is not yet approved or  cleared by the [place] FDA and has been authorized for detection and/or diagnosis of SARS-CoV-2 by FDA  under an Emergency Use Authorization (EUA).  This EUA will remain in effect (meaning this test can be used) for the duration of the COVID-19 declaration under Section 564(b)(1) of the Act, 21 U.S.C. section 360bbb-3(b)(1), unless the authorization is terminated or revoked sooner.  Performed at [facility], [address] [place],  [place] [address]   Comprehensive metabolic panel     Status: Abnormal   Collection Time: 09/10/19  5:20 AM  Result Value Ref Range   Sodium 139 135 - 145 mmol/L   Potassium 4.9 3.5 - 5.1 mmol/L   Chloride 107 98 - 111 mmol/L   CO2 22 22 - 32 mmol/L   Glucose, Bld 179 (H) 70 - 99 mg/dL    Comment: Glucose reference range applies only to samples taken after fasting for at least 8 hours.   BUN 44 (H) 8 - 23 mg/dL   Creatinine, Ser 1.77 (H) 0.61 - 1.24 mg/dL   Calcium 8.4 (L) 8.9 - 10.3 mg/dL   Total Protein 4.6 (L) 6.5 - 8.1 g/dL   Albumin 2.2 (L) 3.5 - 5.0 g/dL   AST 81 (H) 15 - 41 U/L   ALT 65 (H) 0 - 44 U/L   Alkaline Phosphatase 63 38 - 126 U/L   Total Bilirubin 1.3 (H) 0.3 - 1.2 mg/dL   GFR calc non Af Amer 35 (L) >60 mL/min   GFR calc Af Amer 41 (L) >60 mL/min   Anion gap 10 5 - 15    Comment: Performed at [facility], [address] [place], [place] [address]  CBC     Status: Abnormal   Collection Time: 09/10/19  5:20 AM  Result Value Ref Range   WBC 14.1 (H) 4.0 - 10.5 K/uL   RBC 4.97 4.22 - 5.81 MIL/uL   Hemoglobin 15.1 13.0 - 17.0  g/dL   HCT 43.6 39 - 52 %   MCV 87.7 80.0 - 100.0 fL   MCH 30.4 26.0 - 34.0 pg   MCHC 34.6 30.0 - 36.0 g/dL   RDW 17.3 (H) 11.5 - 15.5 %   Platelets 169 150 - 400 K/uL   nRBC 0.0 0.0 - 0.2 %    Comment: Performed at [facility], [address] [place], [place] [address]    US Venous Img Lower Bilateral  Result Date: 09/09/2019 CLINICAL DATA:  Bilateral lower extremity swelling. EXAM: BILATERAL LOWER EXTREMITY VENOUS DOPPLER ULTRASOUND TECHNIQUE: Gray-scale sonography with  compression, as well as color and duplex ultrasound, were performed to evaluate the deep venous system(s) from the level of the common femoral vein through the popliteal and proximal calf veins. COMPARISON:  None. FINDINGS: VENOUS Normal compressibility of the common femoral, superficial femoral, and popliteal veins, as well as the visualized calf veins. Visualized portions of profunda femoral vein and great saphenous vein unremarkable. No filling defects to suggest DVT on grayscale or color Doppler imaging. Doppler waveforms show normal direction of venous flow, normal respiratory plasticity and response to augmentation. Limited views of the contralateral common femoral vein are unremarkable. OTHER Bilateral lower extremity edema is noted. Limitations: none IMPRESSION: 1. No evidence of DVT within the right lower extremity or left lower extremity. 2. Mild diffuse bilateral lower extremity edema. Electronically Signed   By: Virgina Norfolk M.D.   On: 09/09/2019 20:25    Review of Systems  Constitutional: Positive for appetite change. Negative for chills and fever.  HENT: Negative for congestion and sore throat.   Respiratory: Negative for cough and shortness of breath.   Cardiovascular: Negative for chest pain and palpitations.  Gastrointestinal: Negative for nausea and vomiting.  Endocrine: Negative for polydipsia and polyuria.  Genitourinary: Negative for dysuria and urgency.  Musculoskeletal:       Relates some pain in the feet and lower extremities with difficulty walking.  Neurological: Positive for weakness. Negative for numbness.  Psychiatric/Behavioral: Negative for behavioral problems. The patient is not nervous/anxious.    Blood pressure 109/66, pulse 71, temperature 98.2 F (36.8 C), resp. rate 16, height 5\' 11"  (1.803 m), weight 77.9 kg, SpO2 100 %. Physical Exam  Cardiovascular:  DP and PT pulses are difficult to palpate, possibly trace present.  Musculoskeletal:     Comments:  Stiff and limited range of motion in the pedal joints.  Muscle testing deferred.  Neurological:  There does appear to be some loss of protective threshold a monofilament wire in several toes but not all.  Skin:  Skin is dry and atrophic with chronic edema and hyperpigmentation changes related to his venous stasis.  A full-thickness ulceration is noted at the distal aspect of the right hallux approximately 10 mm x 5 mm which probes down to the level of bone at the distal phalanx.  Full-thickness ulceration noted beneath the third metatarsal area bilateral approximately 2.5 cm x 8 mm which probes down to the level of the capsule at the metatarsal phalangeal joint.  Full-thickness ulceration beneath the left third metatarsal measures approximately 1.5 cm x 1 cm and also probes down to the level of joint.    Assessment/Plan: Assessment: 1.  Significant peripheral vascular disease. 2.  Multiple full-thickness ulcerations. 3.  Osteomyelitis right hallux.  Plan: Betadine gauze dressings applied to the ulceration sites.  We will obtain bilateral MRIs for evaluation of osteomyelitis or abscess in the forefoot areas.  We will obtain a  vascular consult prior to any debridement or amputations as he is at significant risk for higher amputation.  Durward Fortes 09/10/2019, 12:09 PM

## 2019-09-11 LAB — BASIC METABOLIC PANEL
Anion gap: 5 (ref 5–15)
BUN: 44 mg/dL — ABNORMAL HIGH (ref 8–23)
CO2: 26 mmol/L (ref 22–32)
Calcium: 7.8 mg/dL — ABNORMAL LOW (ref 8.9–10.3)
Chloride: 107 mmol/L (ref 98–111)
Creatinine, Ser: 1.63 mg/dL — ABNORMAL HIGH (ref 0.61–1.24)
GFR calc Af Amer: 45 mL/min — ABNORMAL LOW (ref >60)
GFR calc non Af Amer: 39 mL/min — ABNORMAL LOW (ref >60)
Glucose, Bld: 141 mg/dL — ABNORMAL HIGH (ref 70–99)
Potassium: 3.6 mmol/L (ref 3.5–5.1)
Sodium: 138 mmol/L (ref 135–145)

## 2019-09-11 MED ORDER — VANCOMYCIN HCL IN DEXTROSE 1-5 GM/200ML-% IV SOLN
1000.0000 mg | INTRAVENOUS | Status: DC
  Administered 2019-09-11: 1000 mg via INTRAVENOUS
  Filled 2019-09-11 (×3): qty 200

## 2019-09-11 MED ORDER — ADULT MULTIVITAMIN W/MINERALS CH
1.0000 | ORAL_TABLET | Freq: Every day | ORAL | Status: DC
  Administered 2019-09-11 – 2019-09-16 (×6): 1 via ORAL
  Filled 2019-09-11 (×6): qty 1

## 2019-09-11 NOTE — Consult Note (Addendum)
[facility] Nurse Consult Note: WOC Nursing is simultaneously consulted with Vascular Surgery and Podiatry. The patient is well known to both services and he was seen by Dr. Cleda Mccreedy (Podiatry) and Dr. Lorenso Courier (Vascular) yesterday.  Betadine dressings are ordered to the toe/foot wounds by Podiatry.  Unna's boots are requested to the bilateral LEs by Vascular. Angiogram is being considered for Monday.  [facility] Nurse will see Monday for placement of Unna's Boots.  Thanks, Maudie Flakes, MSN, RN, [place], Arther Abbott  Pager# [phone]

## 2019-09-11 NOTE — Progress Notes (Signed)
Subjective/Chief Complaint: Patient seen.  Still some pain in the feet.   Objective: Vital signs in last 24 hours: Temp:  [97.5 F (36.4 C)-97.9 F (36.6 C)] 97.5 F (36.4 C) (06/20 0743) Pulse Rate:  [52-70] 52 (06/20 1150) Resp:  [14-18] 18 (06/20 1150) BP: (98-110)/(56-76) 103/57 (06/20 1150) SpO2:  [94 %-99 %] 96 % (06/20 1150) Last BM Date: 09/10/19  Intake/Output from previous day: 06/19 0701 - 06/20 0700 In: 939.4 [P.O.:120; I.V.:719.4; IV Piggyback:100] Out: 2575 [Urine:2225; Stool:350] Intake/Output this shift: Total I/O In: 1654.7 [I.V.:1404.7; IV Piggyback:250] Out: 500 [Urine:500]  Bandages are dry and intact.  MRI of both feet yesterday showed osteomyelitis in the right hallux and left third metatarsal.  Upon review of the images early osteomyelitis in the right third metatarsal cannot be completely excluded.  Lab Results:  Recent Labs    09/09/19 1412 09/10/19 0520  WBC 12.7* 14.1*  HGB 14.2 15.1  HCT 42.1 43.6  PLT 196 169   BMET Recent Labs    09/10/19 0520 09/11/19 0507  NA 139 138  K 4.9 3.6  CL 107 107  CO2 22 26  GLUCOSE 179* 141*  BUN 44* 44*  CREATININE 1.77* 1.63*  CALCIUM 8.4* 7.8*   PT/INR No results for input(s): LABPROT, INR in the last 72 hours. ABG No results for input(s): PHART, HCO3 in the last 72 hours.  Invalid input(s): PCO2, PO2  Studies/Results: MR FOOT RIGHT WO CONTRAST  Result Date: 09/10/2019 CLINICAL DATA:  Chronic soft tissue ulcerations of the foot. EXAM: MRI OF THE RIGHT FOREFOOT WITHOUT CONTRAST TECHNIQUE: Multiplanar, multisequence MR imaging of the right forefoot was performed. No intravenous contrast was administered. COMPARISON:  Radiographs dated 09/01/2019 FINDINGS: Bones/Joint/Cartilage There is osteomyelitis of the distal phalanx of the great toe with focal destruction of a portion of the tuft of the distal phalanx. No other significant bone abnormality. Muscles and Tendons No significant  abnormalities. Soft tissues Nonspecific slight edema on the dorsum of the foot as well as slight edema in the subcutaneous fat of the plantar aspect of the foot. No discrete soft tissue abscesses. 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 Electronically Signed   By: Lorriane Shire M.D.   On: 09/10/2019 14:20   MR FOOT LEFT WO CONTRAST  Result Date: 09/10/2019 CLINICAL DATA:  Soft tissue ulcerations of the left foot. EXAM: MRI OF THE LEFT FOREFOOT WITHOUT CONTRAST TECHNIQUE: Multiplanar, multisequence MR imaging of the left midfoot and forefoot was performed. No intravenous contrast was administered. COMPARISON:  Radiographs dated 09/01/2019 FINDINGS: Bones/Joint/Cartilage There is abnormal edema in the head of the third metatarsal and the base of the proximal phalanx of the third toe. No discrete bone destruction. There is a deep soft tissue ulceration underlying the head of the third metatarsal. The bones of the midfoot and forefoot are otherwise normal. Muscles and Tendons There is a focal fluid collection surrounding the extensor tendon to the third toe at the level of the distal shaft of the metatarsal, best seen on image 28 of series 4, likely representing an abscess. The fluid collection extends toward the soft tissue ulceration Soft tissues Nonspecific subcutaneous edema in the forefoot. No discrete soft tissue abscesses. IMPRESSION: Osteomyelitis of the head of the third metatarsal and the base of the proximal phalanx of the third toe. Tenosynovitis of the flexor tendon of the third toe as described above. This is likely infectious. Electronically Signed   By: Lorriane Shire M.D.   On:  09/10/2019 14:26   US Venous Img Lower Bilateral  Result Date: 09/09/2019 CLINICAL DATA:  Bilateral lower extremity swelling. EXAM: BILATERAL LOWER EXTREMITY VENOUS DOPPLER ULTRASOUND TECHNIQUE: Gray-scale sonography with compression, as well as color and duplex ultrasound, were performed to  evaluate the deep venous system(s) from the level of the common femoral vein through the popliteal and proximal calf veins. COMPARISON:  None. FINDINGS: VENOUS Normal compressibility of the common femoral, superficial femoral, and popliteal veins, as well as the visualized calf veins. Visualized portions of profunda femoral vein and great saphenous vein unremarkable. No filling defects to suggest DVT on grayscale or color Doppler imaging. Doppler waveforms show normal direction of venous flow, normal respiratory plasticity and response to augmentation. Limited views of the contralateral common femoral vein are unremarkable. OTHER Bilateral lower extremity edema is noted. Limitations: none IMPRESSION: 1. No evidence of DVT within the right lower extremity or left lower extremity. 2. Mild diffuse bilateral lower extremity edema. Electronically Signed   By: Virgina Norfolk M.D.   On: 09/09/2019 20:25    Anti-infectives: Anti-infectives (From admission, onward)   Start     Dose/Rate Route Frequency Ordered Stop   09/11/19 2200  vancomycin (VANCOCIN) IVPB 1000 mg/200 mL premix     Discontinue     1,000 mg 200 mL/hr over 60 Minutes Intravenous Every 24 hours 09/11/19 1037     09/10/19 2200  vancomycin (VANCOREADY) IVPB 750 mg/150 mL  Status:  Discontinued        750 mg 150 mL/hr over 60 Minutes Intravenous Every 24 hours 09/09/19 2103 09/11/19 1037   09/09/19 2200  ceFEPIme (MAXIPIME) 2 g in sodium chloride 0.9 % 100 mL IVPB     Discontinue     2 g 200 mL/hr over 30 Minutes Intravenous Every 12 hours 09/09/19 2104     09/09/19 2115  vancomycin (VANCOREADY) IVPB 1750 mg/350 mL        1,750 mg 175 mL/hr over 120 Minutes Intravenous  Once 09/09/19 2100 09/10/19 0345   09/09/19 1945  vancomycin (VANCOCIN) IVPB 1000 mg/200 mL premix  Status:  Discontinued        1,000 mg 200 mL/hr over 60 Minutes Intravenous  Once 09/09/19 1940 09/09/19 2059   09/09/19 1945  cefTRIAXone (ROCEPHIN) 2 g in sodium chloride  0.9 % 100 mL IVPB        2 g 200 mL/hr over 30 Minutes Intravenous  Once 09/09/19 1940 09/09/19 2145      Assessment/Plan: s/p Procedure(s): Lower Extremity Angiography (Right) Assessment: Severe peripheral vascular disease with osteomyelitis right hallux and left third metatarsal.   Plan: Discussed with the patient that we will wait for possible angiogram tomorrow to determine circulation status and ability to heal.  Appreciate vascular surgery consult yesterday.  Discussed with the patient that if his circulation can be improved he will likely need amputation of the right great toe as well as debridement of the infected bone in the left foot.  If circulation cannot be improved discussed that he may be at significant risk for higher amputation.  At this point we will wait for possible procedure tomorrow and vascular recommendations.  LOS: 2 days    Durward Fortes 09/11/2019

## 2019-09-11 NOTE — Progress Notes (Signed)
Subjective/Chief Complaint: Doing OK. Discomfort in feet. Unchanged from prior exam   Objective: Vital signs in last 24 hours: Temp:  [97.5 F (36.4 C)-98.2 F (36.8 C)] 97.5 F (36.4 C) (06/20 0743) Pulse Rate:  [52-71] 52 (06/20 0743) Resp:  [14-18] 15 (06/20 0743) BP: (98-110)/(56-76) 101/76 (06/20 0743) SpO2:  [94 %-100 %] 94 % (06/20 0743) Last BM Date: 09/10/19  Intake/Output from previous day: 06/19 0701 - 06/20 0700 In: 939.4 [P.O.:120; I.V.:719.4; IV Piggyback:100] Out: 2575 [Urine:2225; Stool:350] Intake/Output this shift: Total I/O In: 1654.7 [I.V.:1404.7; IV Piggyback:250] Out: 500 [Urine:500]  General appearance: alert and no distress Resp: clear to auscultation bilaterally Extremities: Edema. Scabbing, ulcerations, erythema bilateral lower extremities. Feet bandaged  Lab Results:  Recent Labs    09/09/19 1412 09/10/19 0520  WBC 12.7* 14.1*  HGB 14.2 15.1  HCT 42.1 43.6  PLT 196 169   BMET Recent Labs    09/10/19 0520 09/11/19 0507  NA 139 138  K 4.9 3.6  CL 107 107  CO2 22 26  GLUCOSE 179* 141*  BUN 44* 44*  CREATININE 1.77* 1.63*  CALCIUM 8.4* 7.8*   PT/INR No results for input(s): LABPROT, INR in the last 72 hours. ABG No results for input(s): PHART, HCO3 in the last 72 hours.  Invalid input(s): PCO2, PO2  Studies/Results: MR FOOT RIGHT WO CONTRAST  Result Date: 09/10/2019 CLINICAL DATA:  Chronic soft tissue ulcerations of the foot. EXAM: MRI OF THE RIGHT FOREFOOT WITHOUT CONTRAST TECHNIQUE: Multiplanar, multisequence MR imaging of the right forefoot was performed. No intravenous contrast was administered. COMPARISON:  Radiographs dated 09/01/2019 FINDINGS: Bones/Joint/Cartilage There is osteomyelitis of the distal phalanx of the great toe with focal destruction of a portion of the tuft of the distal phalanx. No other significant bone abnormality. Muscles and Tendons No significant abnormalities. Soft tissues Nonspecific slight  edema on the dorsum of the foot as well as slight edema in the subcutaneous fat of the plantar aspect of the foot. No discrete soft tissue abscesses. 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 Electronically Signed   By: Lorriane Shire M.D.   On: 09/10/2019 14:20   MR FOOT LEFT WO CONTRAST  Result Date: 09/10/2019 CLINICAL DATA:  Soft tissue ulcerations of the left foot. EXAM: MRI OF THE LEFT FOREFOOT WITHOUT CONTRAST TECHNIQUE: Multiplanar, multisequence MR imaging of the left midfoot and forefoot was performed. No intravenous contrast was administered. COMPARISON:  Radiographs dated 09/01/2019 FINDINGS: Bones/Joint/Cartilage There is abnormal edema in the head of the third metatarsal and the base of the proximal phalanx of the third toe. No discrete bone destruction. There is a deep soft tissue ulceration underlying the head of the third metatarsal. The bones of the midfoot and forefoot are otherwise normal. Muscles and Tendons There is a focal fluid collection surrounding the extensor tendon to the third toe at the level of the distal shaft of the metatarsal, best seen on image 28 of series 4, likely representing an abscess. The fluid collection extends toward the soft tissue ulceration Soft tissues Nonspecific subcutaneous edema in the forefoot. No discrete soft tissue abscesses. IMPRESSION: Osteomyelitis of the head of the third metatarsal and the base of the proximal phalanx of the third toe. Tenosynovitis of the flexor tendon of the third toe as described above. This is likely infectious. Electronically Signed   By: Lorriane Shire M.D.   On: 09/10/2019 14:26   US Venous Img Lower Bilateral  Result Date: 09/09/2019 CLINICAL DATA:  Bilateral  lower extremity swelling. EXAM: BILATERAL LOWER EXTREMITY VENOUS DOPPLER ULTRASOUND TECHNIQUE: Gray-scale sonography with compression, as well as color and duplex ultrasound, were performed to evaluate the deep venous system(s) from the level  of the common femoral vein through the popliteal and proximal calf veins. COMPARISON:  None. FINDINGS: VENOUS Normal compressibility of the common femoral, superficial femoral, and popliteal veins, as well as the visualized calf veins. Visualized portions of profunda femoral vein and great saphenous vein unremarkable. No filling defects to suggest DVT on grayscale or color Doppler imaging. Doppler waveforms show normal direction of venous flow, normal respiratory plasticity and response to augmentation. Limited views of the contralateral common femoral vein are unremarkable. OTHER Bilateral lower extremity edema is noted. Limitations: none IMPRESSION: 1. No evidence of DVT within the right lower extremity or left lower extremity. 2. Mild diffuse bilateral lower extremity edema. Electronically Signed   By: Virgina Norfolk M.D.   On: 09/09/2019 20:25    Anti-infectives: Anti-infectives (From admission, onward)   Start     Dose/Rate Route Frequency Ordered Stop   09/11/19 2200  vancomycin (VANCOCIN) IVPB 1000 mg/200 mL premix     Discontinue     1,000 mg 200 mL/hr over 60 Minutes Intravenous Every 24 hours 09/11/19 1037     09/10/19 2200  vancomycin (VANCOREADY) IVPB 750 mg/150 mL  Status:  Discontinued        750 mg 150 mL/hr over 60 Minutes Intravenous Every 24 hours 09/09/19 2103 09/11/19 1037   09/09/19 2200  ceFEPIme (MAXIPIME) 2 g in sodium chloride 0.9 % 100 mL IVPB     Discontinue     2 g 200 mL/hr over 30 Minutes Intravenous Every 12 hours 09/09/19 2104     09/09/19 2115  vancomycin (VANCOREADY) IVPB 1750 mg/350 mL        1,750 mg 175 mL/hr over 120 Minutes Intravenous  Once 09/09/19 2100 09/10/19 0345   09/09/19 1945  vancomycin (VANCOCIN) IVPB 1000 mg/200 mL premix  Status:  Discontinued        1,000 mg 200 mL/hr over 60 Minutes Intravenous  Once 09/09/19 1940 09/09/19 2059   09/09/19 1945  cefTRIAXone (ROCEPHIN) 2 g in sodium chloride 0.9 % 100 mL IVPB        2 g 200 mL/hr over 30  Minutes Intravenous  Once 09/09/19 1940 09/09/19 2145      Assessment/Plan:   Plan for Angiogram tomorrow. However, likely limited options for revascularization at this juncture. Will discuss with Dr. Lucky Cowboy. Chronic bilateral foot wounds. High risk for intervention with age and comorbidities.  LOS: 2 days    Randy Howard 09/11/2019

## 2019-09-11 NOTE — Progress Notes (Signed)
PROGRESS NOTE    Randy Howard  DJM:[id number] DOB: Nov 15, 1940 DOA: 09/09/2019 PCP: Olin Hauser, DO   Brief Narrative:  Randy Howard is a 79 y.o. male with medical history significant of coronary artery disease, hypertension, hyperlipidemia, peripheral neuropathy, peripheral vascular disease, BPH, history of skin cancer, history of stent placement, history of carotid artery disease who is being followed by vascular surgery to continue presenting to the ER today with bilateral feet ulcers.  Patient was known to have vascular insufficiency as well as venous stasis dermatitis.  It appears he has multiple venous ulcers bilaterally.  He has pain in his feet bilaterally. Imaging of both feet shows osteomyelitis on right foot with visible ulceration and cellulitis of left foot.  Podiatry was consulted and he was admitted with broad-spectrum antibiotics.  Subjective: Patient was complaining of foot pain. He was resting comfortably when seen today along with Dr. Cleda Mccreedy with podiatry today.  Assessment & Plan:   Principal Problem:   Acute osteomyelitis of right foot (HCC) Active Problems:   Hypertension   Chronic venous insufficiency   Peripheral vascular disease (HCC)   CAD (coronary artery disease)   Anxiety disorder   CKD (chronic kidney disease), stage III   Cellulitis of both feet  right foot osteomyelitis: Involving the great toe.  Also related to patient's vascular ulceration and cellulitis.  Patient has a recent wound culture done at wound clinic which was positive for Enterobacter cloacae and Enterococcus faecalis.  He was placed on cefepime and vancomycin on admission which will cover both of those bacteria along with Pseudomonas. -MRI with right hallux and a possible third metatarsal osteomyelitis along with left third metatarsal osteomyelitis. -Podiatry was consulted-once vascular surgery to do angiogram to see his circulation before proceeding for right big toe  amputation and left bone debridement. Patient might need higher levels of amputation if no circulation. Patient has an history of multiple procedures on right leg for his PAD. -Continue cefepime and vancomycin.  Severe peripheral arterial disease.  Patient has very weak peripheral pulses.  Has an history of bilateral vascular ulcers and cellulitis. Vascular surgery was also consulted on admission-he will need a vascular procedure before amputation.  -Patient is going for angiography with vascular tomorrow. Had multiple procedures on right leg and might not be amenable for further procedures. -Unna boot after the procedure tomorrow.  CAD.  No acute concern. -Continue to monitor and home meds.  AKI with CKD stage IIIa.  Likley prerenal.  Slight improvement of creatinine this morning. -Avoid nephrotoxins -continue to monitor  History of anxiety disorder. -Continue home meds.  Objective: Vitals:   09/10/19 1544 09/10/19 2240 09/11/19 0743 09/11/19 1150  BP: 110/61 (!) 98/56 101/76 (!) 103/57  Pulse: 70 (!) 54 (!) 52 (!) 52  Resp: 18 14 15 18   Temp: 97.7 F (36.5 C) 97.9 F (36.6 C) (!) 97.5 F (36.4 C)   TempSrc: Oral Axillary Oral   SpO2: 99% 99% 94% 96%  Weight:      Height:        Intake/Output Summary (Last 24 hours) at 09/11/2019 1417 Last data filed at 09/11/2019 1306 Gross per 24 hour  Intake 2474.09 ml  Output 3425 ml  Net -950.91 ml   Filed Weights   09/09/19 1410 09/09/19 2316  Weight: 68.5 kg 77.9 kg    Examination:  General exam: Chronically ill-appearing elderly man, appears calm and comfortable  Respiratory system: Clear to auscultation. Respiratory effort normal. Cardiovascular system: S1 & S2  heard, RRR. No JVD, murmurs, rubs, Gastrointestinal system: Soft, nontender, nondistended, bowel sounds positive. Central nervous system: Alert and oriented. No focal neurological deficits.Symmetric 5 x 5 power. Extremities: Bilateral 2+ lower extremity edema with  signs of chronic venous stasis and congestion.  Bilateral scaling and a small area of raw skin where blister ruptured on right medial extremity.  Both feet with clean bandages. Psychiatry: Judgement and insight appear normal.   DVT prophylaxis: Lovenox Code Status: Full Family Communication: Discussed with patient Disposition Plan:  Status is: Inpatient  Remains inpatient appropriate because:Inpatient level of care appropriate due to severity of illness   Dispo: The patient is from: Home              Anticipated d/c is to: To be determined              Anticipated d/c date is: > 3 days              Patient currently is not medically stable to d/c.  Patient will need a transmetatarsal amputation after vascular procedure.  Consultants:   Podiatry  Vascular surgery  Procedures:  Antimicrobials:  Cefepime Vancomycin  Data Reviewed: I have personally reviewed following labs and imaging studies  CBC: Recent Labs  Lab 09/09/19 1412 09/10/19 0520  WBC 12.7* 14.1*  NEUTROABS 11.2*  --   HGB 14.2 15.1  HCT 42.1 43.6  MCV 88.6 87.7  PLT 196 166   Basic Metabolic Panel: Recent Labs  Lab 09/09/19 1412 09/10/19 0520 09/11/19 0507  NA 138 139 138  K 4.5 4.9 3.6  CL 104 107 107  CO2 22 22 26   GLUCOSE 204* 179* 141*  BUN 35* 44* 44*  CREATININE 1.68* 1.77* 1.63*  CALCIUM 9.0 8.4* 7.8*   GFR: Estimated Creatinine Clearance: 38.5 mL/min (A) (by C-G formula based on SCr of 1.63 mg/dL (H)). Liver Function Tests: Recent Labs  Lab 09/09/19 1412 09/10/19 0520  AST 188* 81*  ALT 97* 65*  ALKPHOS 89 63  BILITOT 2.0* 1.3*  PROT 6.5 4.6*  ALBUMIN 3.0* 2.2*   No results for input(s): LIPASE, AMYLASE in the last 168 hours. No results for input(s): AMMONIA in the last 168 hours. Coagulation Profile: No results for input(s): INR, PROTIME in the last 168 hours. Cardiac Enzymes: No results for input(s): CKTOTAL, CKMB, CKMBINDEX, TROPONINI in the last 168 hours. BNP (last 3  results) No results for input(s): PROBNP in the last 8760 hours. HbA1C: No results for input(s): HGBA1C in the last 72 hours. CBG: No results for input(s): GLUCAP in the last 168 hours. Lipid Profile: No results for input(s): CHOL, HDL, LDLCALC, TRIG, CHOLHDL, LDLDIRECT in the last 72 hours. Thyroid Function Tests: No results for input(s): TSH, T4TOTAL, FREET4, T3FREE, THYROIDAB in the last 72 hours. Anemia Panel: No results for input(s): VITAMINB12, FOLATE, FERRITIN, TIBC, IRON, RETICCTPCT in the last 72 hours. Sepsis Labs: Recent Labs  Lab 09/09/19 1412 09/09/19 2006  LATICACIDVEN 2.8* 2.5*    Recent Results (from the past 240 hour(s))  Aerobic Culture (superficial specimen)     Status: None   Collection Time: 09/02/19  2:20 PM   Specimen: Foot  Result Value Ref Range Status   Specimen Description   Final    FOOT RIGHT Performed at [facility], [address] [place], [place] [address]    Special Requests   Final    NONE Performed at [facility], [address] [place], [place] [address]    Gram  Stain   Final    NO WBC SEEN ABUNDANT GRAM NEGATIVE COCCOBACILLI FEW GRAM POSITIVE COCCI Performed at [facility], [place] [address], [place], [place] [address]    Culture   Final    ABUNDANT ENTEROBACTER CLOACAE FEW ENTEROCOCCUS FAECALIS    Report Status 09/06/2019 FINAL  Final   Organism ID, Bacteria ENTEROBACTER CLOACAE  Final   Organism ID, Bacteria ENTEROCOCCUS FAECALIS  Final      Susceptibility   Enterobacter cloacae - MIC*    CEFAZOLIN >=64 RESISTANT Resistant     CEFEPIME <=1 SENSITIVE Sensitive     CEFTAZIDIME <=1 SENSITIVE Sensitive     CEFTRIAXONE <=1 SENSITIVE Sensitive     CIPROFLOXACIN <=0.25 SENSITIVE Sensitive     GENTAMICIN <=1 SENSITIVE Sensitive     IMIPENEM 0.5 SENSITIVE Sensitive     TRIMETH/SULFA <=20 SENSITIVE Sensitive     PIP/TAZO <=4 SENSITIVE Sensitive     * ABUNDANT ENTEROBACTER CLOACAE   Enterococcus  faecalis - MIC*    AMPICILLIN <=2 SENSITIVE Sensitive     VANCOMYCIN 1 SENSITIVE Sensitive     GENTAMICIN SYNERGY SENSITIVE Sensitive     * FEW ENTEROCOCCUS FAECALIS  Blood culture (routine x 2)     Status: None (Preliminary result)   Collection Time: 09/09/19  8:06 PM   Specimen: BLOOD  Result Value Ref Range Status   Specimen Description BLOOD LEFT ANTECUBITAL  Final   Special Requests   Final    BOTTLES DRAWN AEROBIC AND ANAEROBIC Blood Culture results may not be optimal due to an inadequate volume of blood received in culture bottles   Culture   Final    NO GROWTH 2 DAYS Performed at [facility], [address] [place], [place] [address]    Report Status PENDING  Incomplete  Blood culture (routine x 2)     Status: None (Preliminary result)   Collection Time: 09/09/19  8:06 PM   Specimen: BLOOD  Result Value Ref Range Status   Specimen Description BLOOD LEFT HAND  Final   Special Requests   Final    BOTTLES DRAWN AEROBIC AND ANAEROBIC Blood Culture adequate volume   Culture   Final    NO GROWTH 2 DAYS Performed at [facility], [address], [place], [place] [address]    Report Status PENDING  Incomplete  SARS Coronavirus 2 by RT PCR (hospital order, performed in [place] hospital lab) Nasopharyngeal Nasopharyngeal Swab     Status: None   Collection Time: 09/09/19 10:15 PM   Specimen: Nasopharyngeal Swab  Result Value Ref Range Status   SARS Coronavirus 2 NEGATIVE NEGATIVE Final    Comment: (NOTE) SARS-CoV-2 target nucleic acids are NOT DETECTED.  The SARS-CoV-2 RNA is generally detectable in upper and lower respiratory specimens during the acute phase of infection. The lowest concentration of SARS-CoV-2 viral copies this assay can detect is 250 copies / mL. A negative result does not preclude SARS-CoV-2 infection and should not be used as the sole basis for treatment or other patient management decisions.  A negative result may occur  with improper specimen collection / handling, submission of specimen other than nasopharyngeal swab, presence of viral mutation(s) within the areas targeted by this assay, and inadequate number of viral copies (<250 copies / mL). A negative result must be combined with clinical observations, patient history, and epidemiological information.  Fact Sheet for Patients:   StrictlyIdeas.no  Fact Sheet for Healthcare Providers: BankingDealers.co.za  This test is not yet  approved or  cleared by the [place] and has been authorized for detection and/or diagnosis of SARS-CoV-2 by FDA under an Emergency Use Authorization (EUA).  This EUA will remain in effect (meaning this test can be used) for the duration of the COVID-19 declaration under Section 564(b)(1) of the Act, 21 U.S.C. section 360bbb-3(b)(1), unless the authorization is terminated or revoked sooner.  Performed at [facility], [address], [place], [place] [address]      Radiology Studies: MR FOOT RIGHT WO CONTRAST  Result Date: 09/10/2019 CLINICAL DATA:  Chronic soft tissue ulcerations of the foot. EXAM: MRI OF THE RIGHT FOREFOOT WITHOUT CONTRAST TECHNIQUE: Multiplanar, multisequence MR imaging of the right forefoot was performed. No intravenous contrast was administered. COMPARISON:  Radiographs dated 09/01/2019 FINDINGS: Bones/Joint/Cartilage There is osteomyelitis of the distal phalanx of the great toe with focal destruction of a portion of the tuft of the distal phalanx. No other significant bone abnormality. Muscles and Tendons No significant abnormalities. Soft tissues Nonspecific slight edema on the dorsum of the foot as well as slight edema in the subcutaneous fat of the plantar aspect of the foot. No discrete soft tissue abscesses. 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 Electronically Signed   By: Lorriane Shire M.D.   On: 09/10/2019  14:20   MR FOOT LEFT WO CONTRAST  Result Date: 09/10/2019 CLINICAL DATA:  Soft tissue ulcerations of the left foot. EXAM: MRI OF THE LEFT FOREFOOT WITHOUT CONTRAST TECHNIQUE: Multiplanar, multisequence MR imaging of the left midfoot and forefoot was performed. No intravenous contrast was administered. COMPARISON:  Radiographs dated 09/01/2019 FINDINGS: Bones/Joint/Cartilage There is abnormal edema in the head of the third metatarsal and the base of the proximal phalanx of the third toe. No discrete bone destruction. There is a deep soft tissue ulceration underlying the head of the third metatarsal. The bones of the midfoot and forefoot are otherwise normal. Muscles and Tendons There is a focal fluid collection surrounding the extensor tendon to the third toe at the level of the distal shaft of the metatarsal, best seen on image 28 of series 4, likely representing an abscess. The fluid collection extends toward the soft tissue ulceration Soft tissues Nonspecific subcutaneous edema in the forefoot. No discrete soft tissue abscesses. IMPRESSION: Osteomyelitis of the head of the third metatarsal and the base of the proximal phalanx of the third toe. Tenosynovitis of the flexor tendon of the third toe as described above. This is likely infectious. Electronically Signed   By: Lorriane Shire M.D.   On: 09/10/2019 14:26   US Venous Img Lower Bilateral  Result Date: 09/09/2019 CLINICAL DATA:  Bilateral lower extremity swelling. EXAM: BILATERAL LOWER EXTREMITY VENOUS DOPPLER ULTRASOUND TECHNIQUE: Gray-scale sonography with compression, as well as color and duplex ultrasound, were performed to evaluate the deep venous system(s) from the level of the common femoral vein through the popliteal and proximal calf veins. COMPARISON:  None. FINDINGS: VENOUS Normal compressibility of the common femoral, superficial femoral, and popliteal veins, as well as the visualized calf veins. Visualized portions of profunda femoral vein  and great saphenous vein unremarkable. No filling defects to suggest DVT on grayscale or color Doppler imaging. Doppler waveforms show normal direction of venous flow, normal respiratory plasticity and response to augmentation. Limited views of the contralateral common femoral vein are unremarkable. OTHER Bilateral lower extremity edema is noted. Limitations: none IMPRESSION: 1. No evidence of DVT within the right lower extremity or left lower extremity. 2. Mild  diffuse bilateral lower extremity edema. Electronically Signed   By: Virgina Norfolk M.D.   On: 09/09/2019 20:25    Scheduled Meds:  atorvastatin  10 mg Oral Daily   benazepril  40 mg Oral Daily   enoxaparin (LOVENOX) injection  40 mg Subcutaneous Q24H   feeding supplement (ENSURE ENLIVE)  237 mL Oral BID BM   finasteride  5 mg Oral Daily   fluticasone  2 spray Each Nare Daily   gabapentin  100-200 mg Oral QHS   gabapentin  200 mg Oral Daily   metoprolol tartrate  50 mg Oral BID   multivitamin with minerals  1 tablet Oral Daily   sodium chloride flush  3 mL Intravenous Once   tamsulosin  0.4 mg Oral Daily   torsemide  20 mg Oral Daily   Continuous Infusions:  sodium chloride 100 mL/hr at 09/11/19 1147   ceFEPime (MAXIPIME) IV 2 g (09/11/19 1149)   vancomycin       LOS: 2 days   Time spent: 40 minutes.  Lorella Nimrod, MD [facility]  If 7PM-7AM, please contact night-coverage [email or web address]  09/11/2019, 2:17 PM   This record has been created using Systems analyst. Errors have been sought and corrected,but may not always be located. Such creation errors do not reflect on the standard of care.

## 2019-09-11 NOTE — Plan of Care (Signed)

## 2019-09-11 NOTE — Progress Notes (Signed)
Initial Nutrition Assessment  RD working remotely.  DOCUMENTATION CODES:   Not applicable  INTERVENTION:  Continue Ensure Enlive po BID, each supplement provides 350 kcal and 20 grams of protein.  Provide daily MVI.  NUTRITION DIAGNOSIS:   Increased nutrient needs related to wound healing as evidenced by estimated needs.  GOAL:   Patient will meet greater than or equal to 90% of their needs  MONITOR:   PO intake, Supplement acceptance, Labs, Weight trends, Skin, I & O's  REASON FOR ASSESSMENT:   Malnutrition Screening Tool    ASSESSMENT:   79 year old male with PMHx of HTN, PVD, OSA, neuropathy, CAD s/p CABG 2006, HLD, arthritis admitted with right foot osteomyelitis.   Attempted to call patient x 3 but he was unable to answer. Per chart patient reported a decreased appetite for the past 6 months. Current order for Ensure Enlive was re-ordered from home medication list. Per documentation he was able to eat 100% of his lunch yesterday. Patient with increased nutrient needs for wound healing.   Over the past year patient's weight has fluctuated between 68-81 kg. His lowest weight was 68 kg on 09/23/2018. He then gained up to 81 kg on 07/21/2019. He is now documented to be 77.9 kg (171.74 lbs).  Medications reviewed and include: torsemide, NS at 100 mL/hr, cefepime, vancomycin.  Labs reviewed: BUN 44, Creatinine 1.63.  Unable to determine if patient meets criteria for malnutrition at this time.  NUTRITION - FOCUSED PHYSICAL EXAM:  Unable to complete at this time as RD is working remotely.  Diet Order:   Diet Order            Diet NPO time specified  Diet effective midnight           Diet Heart Room service appropriate? Yes; Fluid consistency: Thin  Diet effective now                EDUCATION NEEDS:   No education needs have been identified at this time  Skin:  Skin Assessment: Skin Integrity Issues: Skin Integrity Issues:: Other (Comment) Other: Ulcer right  great toe (1cm x 0.6cm); ulcer sole right foot (2.5cm x 1cm); open blister right leg (6.2cm x 6.5cm); ulcer sole left foot (1.7cm x 1.3cm)  Last BM:  09/10/2019 - medium type 6  Height:   Ht Readings from Last 1 Encounters:  09/09/19 5\' 11"  (1.803 m)   Weight:   Wt Readings from Last 1 Encounters:  09/09/19 77.9 kg   BMI:  Body mass index is 23.95 kg/m.  Estimated Nutritional Needs:   Kcal:  2000-2200  Protein:  100-110 grams  Fluid:  1.9 L/day  Jacklynn Barnacle, MS, RD, LDN Pager number available on Amion

## 2019-09-11 NOTE — Progress Notes (Signed)
Pharmacy Antibiotic Note  Randy Howard is a 79 y.o. male admitted on 09/09/2019 with osteomyelitis.  Pharmacy has been consulted for Vanc, Cefepime dosing. Hx of ALCALIGENES FAECALIS, ENTEROCOCCUS FAECALIS and ENTEROBACTER CLOACAE in wound culture  Plan: Day 3 of total abx.  Vancomycin 1750 mg IV X 1 ordered to be given on 6/18 @ 2200. Will increase vancomycin 750 mg IV Q24H to 1000 mg q24H per [place] nomogram. Vanc trough before 4th or 5th dose    Cefepime 2 gm IV Q12H    Height: 5\' 11"  (180.3 cm) Weight: 77.9 kg (171 lb 11.8 oz) IBW/kg (Calculated) : 75.3  Temp (24hrs), Avg:97.8 F (36.6 C), Min:97.5 F (36.4 C), Max:98.2 F (36.8 C)  Recent Labs  Lab 09/09/19 1412 09/09/19 2006 09/10/19 0520 09/11/19 0507  WBC 12.7*  --  14.1*  --   CREATININE 1.68*  --  1.77* 1.63*  LATICACIDVEN 2.8* 2.5*  --   --     Estimated Creatinine Clearance: 38.5 mL/min (A) (by C-G formula based on SCr of 1.63 mg/dL (H)).    Allergies  Allergen Reactions  . Oxycodone Nausea Only    Microbiology results: 6/18 BCx: pending:   Thank you for allowing pharmacy to be a part of this patient's care.  Oswald Hillock 09/11/2019 10:37 AM

## 2019-09-12 ENCOUNTER — Encounter
Admission: EM | Disposition: A | Discharge status: Skilled Nursing Facility | Payer: Self-pay | Source: Home / Self Care | Attending: Internal Medicine

## 2019-09-12 ENCOUNTER — Other Ambulatory Visit (INDEPENDENT_AMBULATORY_CARE_PROVIDER_SITE_OTHER): Payer: Self-pay | Admitting: Vascular Surgery

## 2019-09-12 DIAGNOSIS — I70249 Atherosclerosis of native arteries of left leg with ulceration of unspecified site: Secondary | ICD-10-CM

## 2019-09-12 DIAGNOSIS — I70239 Atherosclerosis of native arteries of right leg with ulceration of unspecified site: Secondary | ICD-10-CM

## 2019-09-12 HISTORY — PX: LOWER EXTREMITY ANGIOGRAPHY: CATH118251

## 2019-09-12 LAB — GLUCOSE, CAPILLARY
Glucose-Capillary: 158 mg/dL — ABNORMAL HIGH (ref 70–99)
Glucose-Capillary: 89 mg/dL (ref 70–99)
Glucose-Capillary: 72 mg/dL (ref 70–99)
Glucose-Capillary: 139 mg/dL — ABNORMAL HIGH (ref 70–99)
Glucose-Capillary: 147 mg/dL — ABNORMAL HIGH (ref 70–99)

## 2019-09-12 LAB — BASIC METABOLIC PANEL
Anion gap: 6 (ref 5–15)
BUN: 39 mg/dL — ABNORMAL HIGH (ref 8–23)
CO2: 25 mmol/L (ref 22–32)
Calcium: 7.6 mg/dL — ABNORMAL LOW (ref 8.9–10.3)
Chloride: 107 mmol/L (ref 98–111)
Creatinine, Ser: 1.36 mg/dL — ABNORMAL HIGH (ref 0.61–1.24)
GFR calc Af Amer: 57 mL/min — ABNORMAL LOW (ref >60)
GFR calc non Af Amer: 49 mL/min — ABNORMAL LOW (ref >60)
Glucose, Bld: 210 mg/dL — ABNORMAL HIGH (ref 70–99)
Potassium: 3.5 mmol/L (ref 3.5–5.1)
Sodium: 138 mmol/L (ref 135–145)

## 2019-09-12 LAB — VANCOMYCIN, TROUGH: Vancomycin Tr: 16 ug/mL (ref 15–20)

## 2019-09-12 SURGERY — LOWER EXTREMITY ANGIOGRAPHY
Anesthesia: Moderate Sedation | Laterality: Right

## 2019-09-12 MED ORDER — MIDAZOLAM HCL 5 MG/5ML IJ SOLN
INTRAMUSCULAR | Status: AC
  Filled 2019-09-12: qty 5

## 2019-09-12 MED ORDER — MIDAZOLAM HCL 2 MG/ML PO SYRP: 8.0000 mg | ORAL_SOLUTION | Freq: Once | ORAL | Status: DC | PRN

## 2019-09-12 MED ORDER — FAMOTIDINE 20 MG PO TABS: 40.0000 mg | ORAL_TABLET | Freq: Once | ORAL | Status: DC | PRN

## 2019-09-12 MED ORDER — SODIUM CHLORIDE 0.9 % IV SOLN: INTRAVENOUS | Status: DC

## 2019-09-12 MED ORDER — HYDROMORPHONE HCL 1 MG/ML IJ SOLN
1.0000 mg | Freq: Once | INTRAMUSCULAR | Status: AC | PRN
  Administered 2019-09-13: 1 mg via INTRAVENOUS
  Filled 2019-09-12 (×2): qty 1

## 2019-09-12 MED ORDER — INSULIN ASPART 100 UNIT/ML ~~LOC~~ SOLN
3.0000 [IU] | Freq: Three times a day (TID) | SUBCUTANEOUS | Status: DC
  Administered 2019-09-12 – 2019-09-16 (×9): 3 [IU] via SUBCUTANEOUS
  Filled 2019-09-12 (×9): qty 1

## 2019-09-12 MED ORDER — POTASSIUM CHLORIDE CRYS ER 10 MEQ PO TBCR
10.0000 meq | EXTENDED_RELEASE_TABLET | Freq: Every day | ORAL | Status: DC
  Administered 2019-09-12 – 2019-09-16 (×5): 10 meq via ORAL
  Filled 2019-09-12 (×5): qty 1

## 2019-09-12 MED ORDER — INSULIN GLARGINE 100 UNIT/ML ~~LOC~~ SOLN
6.0000 [IU] | Freq: Every day | SUBCUTANEOUS | Status: DC
  Administered 2019-09-12 – 2019-09-15 (×4): 6 [IU] via SUBCUTANEOUS
  Filled 2019-09-12 (×5): qty 0.06

## 2019-09-12 MED ORDER — ONDANSETRON HCL 4 MG/2ML IJ SOLN
4.0000 mg | Freq: Four times a day (QID) | INTRAMUSCULAR | Status: DC | PRN
  Administered 2019-09-13: 4 mg via INTRAVENOUS

## 2019-09-12 MED ORDER — HEPARIN SODIUM (PORCINE) 1000 UNIT/ML IJ SOLN
INTRAMUSCULAR | Status: AC
  Filled 2019-09-12: qty 1

## 2019-09-12 MED ORDER — MIDAZOLAM HCL 2 MG/2ML IJ SOLN
INTRAMUSCULAR | Status: DC | PRN
  Administered 2019-09-12: 2 mg via INTRAVENOUS

## 2019-09-12 MED ORDER — CLINDAMYCIN PHOSPHATE 300 MG/50ML IV SOLN: 300.0000 mg | INTRAVENOUS | Status: DC

## 2019-09-12 MED ORDER — DIPHENHYDRAMINE HCL 50 MG/ML IJ SOLN: 50.0000 mg | Freq: Once | INTRAMUSCULAR | Status: DC | PRN

## 2019-09-12 MED ORDER — VANCOMYCIN HCL 1250 MG/250ML IV SOLN
1250.0000 mg | INTRAVENOUS | Status: DC
  Administered 2019-09-12 – 2019-09-14 (×3): 1250 mg via INTRAVENOUS
  Filled 2019-09-12 (×4): qty 250

## 2019-09-12 MED ORDER — METHYLPREDNISOLONE SODIUM SUCC 125 MG IJ SOLR: 125.0000 mg | Freq: Once | INTRAMUSCULAR | Status: DC | PRN

## 2019-09-12 MED ORDER — INSULIN ASPART 100 UNIT/ML ~~LOC~~ SOLN
0.0000 [IU] | Freq: Three times a day (TID) | SUBCUTANEOUS | Status: DC
  Administered 2019-09-12: 3 [IU] via SUBCUTANEOUS
  Administered 2019-09-13 – 2019-09-14 (×4): 2 [IU] via SUBCUTANEOUS
  Administered 2019-09-15: 5 [IU] via SUBCUTANEOUS
  Administered 2019-09-15 – 2019-09-16 (×2): 2 [IU] via SUBCUTANEOUS
  Filled 2019-09-12 (×8): qty 1

## 2019-09-12 MED ORDER — FENTANYL CITRATE (PF) 100 MCG/2ML IJ SOLN
INTRAMUSCULAR | Status: DC | PRN
  Administered 2019-09-12: 50 ug via INTRAVENOUS

## 2019-09-12 MED ORDER — CEFAZOLIN SODIUM-DEXTROSE 1-4 GM/50ML-% IV SOLN: 1.0000 g | INTRAVENOUS | Status: DC

## 2019-09-12 MED ORDER — INSULIN ASPART 100 UNIT/ML ~~LOC~~ SOLN: 0.0000 [IU] | Freq: Every day | SUBCUTANEOUS | Status: DC

## 2019-09-12 MED ORDER — FENTANYL CITRATE (PF) 100 MCG/2ML IJ SOLN
INTRAMUSCULAR | Status: AC
  Filled 2019-09-12: qty 2

## 2019-09-12 MED ORDER — CLINDAMYCIN PHOSPHATE 300 MG/50ML IV SOLN
INTRAVENOUS | Status: AC
  Filled 2019-09-12: qty 50

## 2019-09-12 SURGICAL SUPPLY — 8 items
CANNULA 5F STIFF (CANNULA) ×1 IMPLANT
CATH PIG 70CM (CATHETERS) ×1 IMPLANT
GLIDEWIRE ADV .035X260CM (WIRE) ×1 IMPLANT
PACK ANGIOGRAPHY (CUSTOM PROCEDURE TRAY) ×1 IMPLANT
SHEATH BRITE TIP 5FRX11 (SHEATH) ×1 IMPLANT
SYR MEDRAD MARK 7 150ML (SYRINGE) ×1 IMPLANT
TUBING CONTRAST HIGH PRESS 72 (TUBING) ×1 IMPLANT
WIRE J 3MM .035X145CM (WIRE) ×1 IMPLANT

## 2019-09-12 NOTE — Op Note (Signed)
[facility]  Percutaneous Study/Intervention Procedural Note   Date of Surgery: 09/12/2019  Surgeon(s):Estill Llerena    Assistants:none  Pre-operative Diagnosis: PAD with ulceration BLE  Post-operative diagnosis:  Same  Procedure(s) Performed:             1.  Ultrasound guidance for vascular access left femoral artery             2.  Catheter placement into right common femoral artery from left femoral approach             3.  Aortogram and selective right lower extremity angiogram             4.  StarClose closure device left femoral artery  EBL: 5 cc  Contrast: 35 cc  Fluoro Time: 1.9 minutes  Moderate Conscious Sedation Time: approximately 20 minutes using 2 mg of Versed and 50 mcg of Fentanyl              Indications:  Patient is a 79 y.o.male with nonhealing ulcerations on both legs and a long history of severe peripheral arterial disease status post multiple previous surgeries and interventions. The patient is brought in for angiography for further evaluation and potential treatment.  Due to the limb threatening nature of the situation, angiogram was performed for attempted limb salvage. The patient is aware that if the procedure fails, amputation would be expected.  The patient also understands that even with successful revascularization, amputation may still be required due to the severity of the situation.  Risks and benefits are discussed and informed consent is obtained.   Procedure:  The patient was identified and appropriate procedural time out was performed.  The patient was then placed supine on the table and prepped and draped in the usual sterile fashion. Moderate conscious sedation was administered during a face to face encounter with the patient throughout the procedure with my supervision of the RN administering medicines and monitoring the patient's vital signs, pulse oximetry, telemetry and mental status throughout from the start of the  procedure until the patient was taken to the recovery room. Ultrasound was used to evaluate the left common femoral artery.  It was patent .  A digital ultrasound image was acquired.  A Seldinger needle was used to access the left common femoral artery under direct ultrasound guidance and a permanent image was performed.  A 0.035 J wire was advanced without resistance and a 5Fr sheath was placed.  Pigtail catheter was placed into the aorta and an AP aortogram was performed. The left renal artery was seen and was patent.  The right renal artery was not well seen but this may have been due to catheter position.  It is also possible that it was occluded.  The aorta and iliac arteries were highly calcific and tortuous but not stenotic.  There appeared to be a small abdominal aortic aneurysm. I then crossed the aortic bifurcation and advanced to the right femoral head. Selective right lower extremity angiogram was then performed. This demonstrated a flush occlusion of the right SFA with a patulous common femoral artery from his previous endarterectomy.  The profunda femoris is widely patent and collateralizes well to the above-knee popliteal artery.  There is an occlusion in the mid popliteal artery with the below-knee popliteal artery reconstituting.  The peroneal artery was the dominant runoff to the foot with a somewhat diseased posterior tibial artery seen going distally as well although the degree of disease was difficult to discern.  This was not going to be something that we are going to be able to cross from the left femoral approach as I have demonstrated previously.  In addition, the patient contaminated the site and I was fearful of doing any intervention requiring stent placement or even using a StarClose at this point.  I elected to terminate the procedure. The sheath was removed and pressure was held over the left femoral artery with excellent hemostatic result. The patient was taken to the recovery room in  stable condition having tolerated the procedure well.  Findings:               Aortogram:  The left renal artery was seen and was patent.  The right renal artery was not well seen but this may have been due to catheter position.  It is also possible that it was occluded.  The aorta and iliac arteries were highly calcific and tortuous but not stenotic.  There appeared to be a small abdominal aortic aneurysm.             Right Lower Extremity:  This demonstrated a flush occlusion of the right SFA with a patulous common femoral artery from his previous endarterectomy.  The profunda femoris is widely patent and collateralizes well to the above-knee popliteal artery.  There is an occlusion in the mid popliteal artery with the below-knee popliteal artery reconstituting.  The peroneal artery was the dominant runoff to the foot with a somewhat diseased posterior tibial artery seen going distally as well although the degree of disease was difficult to discern   Disposition: Patient was taken to the recovery room in stable condition having tolerated the procedure well.  Complications: None  Leotis Pain 09/12/2019 1:30 PM   This note was created with Dragon Medical transcription system. Any errors in dictation are purely unintentional.

## 2019-09-12 NOTE — Progress Notes (Signed)
PROGRESS NOTE    PEIRCE DEVENEY  KKX:[id number] DOB: Jan 24, 1941 DOA: 09/09/2019 PCP: Olin Hauser, DO   Brief Narrative:  Randy Howard is a 79 y.o. male with medical history significant of coronary artery disease, hypertension, hyperlipidemia, peripheral neuropathy, peripheral vascular disease, BPH, history of skin cancer, history of stent placement, history of carotid artery disease who is being followed by vascular surgery to continue presenting to the ER today with bilateral feet ulcers.  Patient was known to have vascular insufficiency as well as venous stasis dermatitis.  It appears he has multiple venous ulcers bilaterally.  He has pain in his feet bilaterally. Imaging of both feet shows osteomyelitis on right foot with visible ulceration and cellulitis of left foot.  Podiatry was consulted and he was admitted with broad-spectrum antibiotics.  Subjective: Patient was little frustrated that he is lying here for the past 3 days and nothing is being done.  Continue to have foot pain.  Explained to him that he is going for angiography today before proceeding with podiatry.  Assessment & Plan:   Principal Problem:   Acute osteomyelitis of right foot (HCC) Active Problems:   Hypertension   Chronic venous insufficiency   Peripheral vascular disease (HCC)   CAD (coronary artery disease)   Anxiety disorder   CKD (chronic kidney disease), stage III   Cellulitis of both feet  right foot osteomyelitis: Involving the great toe.  Also related to patient's vascular ulceration and cellulitis.  Patient has a recent wound culture done at wound clinic which was positive for Enterobacter cloacae and Enterococcus faecalis.  He was placed on cefepime and vancomycin on admission which will cover both of those bacteria along with Pseudomonas. -MRI with right hallux and a possible third metatarsal osteomyelitis along with left third metatarsal osteomyelitis. -Podiatry was consulted-once  vascular surgery to do angiogram to see his circulation before proceeding for right big toe amputation and left bone debridement. Patient might need higher levels of amputation if no circulation. Patient has an history of multiple procedures on right leg for his PAD. -Continue cefepime and vancomycin.  Severe peripheral arterial disease.  Patient has very weak peripheral pulses.  Has an history of bilateral vascular ulcers and cellulitis. Vascular surgery was also consulted on admission-he will need a vascular procedure before amputation.  -Patient underwent right lower extremity angiogram with vascular surgery today which demonstrated a flush occlusion of the right SFA with a patulous common femoral artery from his previous endarterectomy.  The profunda femoris is widely patent and collateralizes well to the above-knee popliteal artery.  There is an occlusion in the mid popliteal artery with the below-knee popliteal artery reconstituting.  The peroneal artery was the dominant runoff to the foot with a somewhat diseased posterior tibial artery seen going distally as well although the degree of disease was difficult to discern. -Unna boot after the procedure.  CAD.  No acute concern. -Continue to monitor and home meds.  AKI with CKD stage IIIa.  Likley prerenal.  Slight improvement of creatinine this morning. -Avoid nephrotoxins -continue to monitor  History of anxiety disorder. -Continue home meds.  Objective: Vitals:   09/12/19 1341 09/12/19 1400 09/12/19 1430 09/12/19 1450  BP: (!) 110/53 (!) 106/59 118/73 111/66  Pulse:  63 72 63  Resp: 20 (!) 23 17   Temp:    97.8 F (36.6 C)  TempSrc:    Oral  SpO2: 94% 92% 98% 100%  Weight:      Height:  Intake/Output Summary (Last 24 hours) at 09/12/2019 1457 Last data filed at 09/12/2019 1232 Gross per 24 hour  Intake 2132.13 ml  Output 2425 ml  Net -292.87 ml   Filed Weights   09/09/19 1410 09/09/19 2316  Weight: 68.5 kg 77.9 kg      Examination:  General exam: Chronically ill-appearing elderly man, appears calm and comfortable  Respiratory system: Clear to auscultation. Respiratory effort normal. Cardiovascular system: S1 & S2 heard, RRR. No JVD, murmurs, rubs, Gastrointestinal system: Soft, nontender, nondistended, bowel sounds positive. Central nervous system: Alert and oriented. No focal neurological deficits. Extremities: Bilateral 1+ lower extremity edema with signs of chronic venous stasis and congestion.  Bilateral scaling and a small area of raw skin where blister ruptured on right medial extremity.  Both feet with clean bandages. Psychiatry: Judgement and insight appear normal.   DVT prophylaxis: Lovenox Code Status: Full Family Communication: Discussed with patient Disposition Plan:  Status is: Inpatient  Remains inpatient appropriate because:Inpatient level of care appropriate due to severity of illness   Dispo: The patient is from: Home              Anticipated d/c is to: To be determined              Anticipated d/c date is: > 3 days              Patient currently is not medically stable to d/c.  Patient will need a transmetatarsal amputation after vascular procedure.  Consultants:   Podiatry  Vascular surgery  Procedures:  Antimicrobials:  Cefepime Vancomycin  Data Reviewed: I have personally reviewed following labs and imaging studies  CBC: Recent Labs  Lab 09/09/19 1412 09/10/19 0520  WBC 12.7* 14.1*  NEUTROABS 11.2*  --   HGB 14.2 15.1  HCT 42.1 43.6  MCV 88.6 87.7  PLT 196 161   Basic Metabolic Panel: Recent Labs  Lab 09/09/19 1412 09/10/19 0520 09/11/19 0507 09/12/19 0338  NA 138 139 138 138  K 4.5 4.9 3.6 3.5  CL 104 107 107 107  CO2 22 22 26 25   GLUCOSE 204* 179* 141* 210*  BUN 35* 44* 44* 39*  CREATININE 1.68* 1.77* 1.63* 1.36*  CALCIUM 9.0 8.4* 7.8* 7.6*   GFR: Estimated Creatinine Clearance: 46.1 mL/min (A) (by C-G formula based on SCr of 1.36 mg/dL  (H)). Liver Function Tests: Recent Labs  Lab 09/09/19 1412 09/10/19 0520  AST 188* 81*  ALT 97* 65*  ALKPHOS 89 63  BILITOT 2.0* 1.3*  PROT 6.5 4.6*  ALBUMIN 3.0* 2.2*   No results for input(s): LIPASE, AMYLASE in the last 168 hours. No results for input(s): AMMONIA in the last 168 hours. Coagulation Profile: No results for input(s): INR, PROTIME in the last 168 hours. Cardiac Enzymes: No results for input(s): CKTOTAL, CKMB, CKMBINDEX, TROPONINI in the last 168 hours. BNP (last 3 results) No results for input(s): PROBNP in the last 8760 hours. HbA1C: No results for input(s): HGBA1C in the last 72 hours. CBG: Recent Labs  Lab 09/12/19 0915 09/12/19 1132 09/12/19 1416  GLUCAP 158* 89 72   Lipid Profile: No results for input(s): CHOL, HDL, LDLCALC, TRIG, CHOLHDL, LDLDIRECT in the last 72 hours. Thyroid Function Tests: No results for input(s): TSH, T4TOTAL, FREET4, T3FREE, THYROIDAB in the last 72 hours. Anemia Panel: No results for input(s): VITAMINB12, FOLATE, FERRITIN, TIBC, IRON, RETICCTPCT in the last 72 hours. Sepsis Labs: Recent Labs  Lab 09/09/19 1412 09/09/19 2006  LATICACIDVEN 2.8* 2.5*  Recent Results (from the past 240 hour(s))  Blood culture (routine x 2)     Status: None (Preliminary result)   Collection Time: 09/09/19  8:06 PM   Specimen: BLOOD  Result Value Ref Range Status   Specimen Description BLOOD LEFT ANTECUBITAL  Final   Special Requests   Final    BOTTLES DRAWN AEROBIC AND ANAEROBIC Blood Culture results may not be optimal due to an inadequate volume of blood received in culture bottles   Culture   Final    NO GROWTH 3 DAYS Performed at [facility], [address] [place], [place] [address]    Report Status PENDING  Incomplete  Blood culture (routine x 2)     Status: None (Preliminary result)   Collection Time: 09/09/19  8:06 PM   Specimen: BLOOD  Result Value Ref Range Status   Specimen Description BLOOD LEFT HAND  Final    Special Requests   Final    BOTTLES DRAWN AEROBIC AND ANAEROBIC Blood Culture adequate volume   Culture   Final    NO GROWTH 3 DAYS Performed at [facility], [address], [place], [place] [address]    Report Status PENDING  Incomplete  SARS Coronavirus 2 by RT PCR (hospital order, performed in [place] hospital lab) Nasopharyngeal Nasopharyngeal Swab     Status: None   Collection Time: 09/09/19 10:15 PM   Specimen: Nasopharyngeal Swab  Result Value Ref Range Status   SARS Coronavirus 2 NEGATIVE NEGATIVE Final    Comment: (NOTE) SARS-CoV-2 target nucleic acids are NOT DETECTED.  The SARS-CoV-2 RNA is generally detectable in upper and lower respiratory specimens during the acute phase of infection. The lowest concentration of SARS-CoV-2 viral copies this assay can detect is 250 copies / mL. A negative result does not preclude SARS-CoV-2 infection and should not be used as the sole basis for treatment or other patient management decisions.  A negative result may occur with improper specimen collection / handling, submission of specimen other than nasopharyngeal swab, presence of viral mutation(s) within the areas targeted by this assay, and inadequate number of viral copies (<250 copies / mL). A negative result must be combined with clinical observations, patient history, and epidemiological information.  Fact Sheet for Patients:   StrictlyIdeas.no  Fact Sheet for Healthcare Providers: BankingDealers.co.za  This test is not yet approved or  cleared by the [place] FDA and has been authorized for detection and/or diagnosis of SARS-CoV-2 by FDA under an Emergency Use Authorization (EUA).  This EUA will remain in effect (meaning this test can be used) for the duration of the COVID-19 declaration under Section 564(b)(1) of the Act, 21 U.S.C. section 360bbb-3(b)(1), unless the authorization is terminated or revoked  sooner.  Performed at [facility], [address] [place], [place] [address]      Radiology Studies: PERIPHERAL VASCULAR CATHETERIZATION  Result Date: 09/12/2019 See op note   Scheduled Meds: . atorvastatin  10 mg Oral Daily  . benazepril  40 mg Oral Daily  . enoxaparin (LOVENOX) injection  40 mg Subcutaneous Q24H  . feeding supplement (ENSURE ENLIVE)  237 mL Oral BID BM  . fentaNYL      . finasteride  5 mg Oral Daily  . fluticasone  2 spray Each Nare Daily  . gabapentin  100-200 mg Oral QHS  . gabapentin  200 mg Oral Daily  . heparin sodium (porcine)      . insulin aspart  0-15 Units Subcutaneous TID WC  .  insulin aspart  0-5 Units Subcutaneous QHS  . insulin aspart  3 Units Subcutaneous TID WC  . insulin glargine  6 Units Subcutaneous QHS  . metoprolol tartrate  50 mg Oral BID  . midazolam      . multivitamin with minerals  1 tablet Oral Daily  . potassium chloride  10 mEq Oral Daily  . sodium chloride flush  3 mL Intravenous Once  . tamsulosin  0.4 mg Oral Daily  . torsemide  20 mg Oral Daily   Continuous Infusions: . sodium chloride 100 mL/hr at 09/12/19 1056  . ceFEPime (MAXIPIME) IV 2 g (09/12/19 1056)  . vancomycin 1,000 mg (09/11/19 2307)     LOS: 3 days   Time spent: 40 minutes.  Lorella Nimrod, MD [facility]  If 7PM-7AM, please contact night-coverage [email or web address]  09/12/2019, 2:57 PM   This record has been created using Systems analyst. Errors have been sought and corrected,but may not always be located. Such creation errors do not reflect on the standard of care.

## 2019-09-12 NOTE — Progress Notes (Signed)
Pharmacy Antibiotic Note  Randy Howard is a 79 y.o. male admitted on 09/09/2019 with osteomyelitis.  Pharmacy has been consulted for Vanc, Cefepime dosing. Hx of ALCALIGENES FAECALIS, ENTEROCOCCUS FAECALIS and ENTEROBACTER CLOACAE in wound culture  Plan:  6/20 @ 2137 Vanc trough= 16 mcg/ml  Will increase vancomycin from 1000 mg IV Q24H to 1250 mg q24H for gaol trough closer to 20 mcg/ml for Ostemyelitis   Cefepime 2 gm IV Q12H    Height: 5\' 11"  (180.3 cm) Weight: 77.9 kg (171 lb 11.8 oz) IBW/kg (Calculated) : 75.3  Temp (24hrs), Avg:98.1 F (36.7 C), Min:97.5 F (36.4 C), Max:98.5 F (36.9 C)  Recent Labs  Lab 09/09/19 1412 09/09/19 2006 09/10/19 0520 09/11/19 0507 09/11/19 2137 09/12/19 0338  WBC 12.7*  --  14.1*  --   --   --   CREATININE 1.68*  --  1.77* 1.63*  --  1.36*  LATICACIDVEN 2.8* 2.5*  --   --   --   --   VANCOTROUGH  --   --   --   --  16  --     Estimated Creatinine Clearance: 46.1 mL/min (A) (by C-G formula based on SCr of 1.36 mg/dL (H)).    Allergies  Allergen Reactions  . Oxycodone Nausea Only    Microbiology results: 6/18 BCx: pending: NG x 3d  Thank you for allowing pharmacy to be a part of this patient's care.  Latoria Dry A 09/12/2019 3:17 PM

## 2019-09-12 NOTE — Consult Note (Signed)
[facility] Nurse Consult Note: Reason for Consult:Bilateral wounds to feet with severe vascular disease noted.  Dr. Cleda Mccreedy in agreement to wait on compression at this time due to perfusion status.  Will stand by.  Please re-consult if needed.  Vascular has implemented topical wound care orders.  Wound type:vascular Pressure Injury POA: NA Measurement:see vascular note.  Will not follow at this time.  Please re-consult if needed.  Domenic Moras MSN, RN, FNP-BC CWON Wound, Ostomy, Continence Nurse Pager [phone]

## 2019-09-12 NOTE — H&P (Signed)
[facility] History & Physical Update  The patient was interviewed and re-examined.  The patient's previous History and Physical has been reviewed and is unchanged.  There is no change in the plan of care. We plan to proceed with the scheduled procedure.  Leotis Pain, MD  09/12/2019, 12:24 PM

## 2019-09-13 ENCOUNTER — Encounter: Payer: Self-pay | Admitting: Vascular Surgery

## 2019-09-13 DIAGNOSIS — L97516 Non-pressure chronic ulcer of other part of right foot with bone involvement without evidence of necrosis: Secondary | ICD-10-CM

## 2019-09-13 DIAGNOSIS — M86171 Other acute osteomyelitis, right ankle and foot: Secondary | ICD-10-CM

## 2019-09-13 LAB — BASIC METABOLIC PANEL
Anion gap: 6 (ref 5–15)
BUN: 33 mg/dL — ABNORMAL HIGH (ref 8–23)
CO2: 27 mmol/L (ref 22–32)
Calcium: 7.7 mg/dL — ABNORMAL LOW (ref 8.9–10.3)
Chloride: 105 mmol/L (ref 98–111)
Creatinine, Ser: 1.23 mg/dL (ref 0.61–1.24)
GFR calc Af Amer: >60 mL/min (ref >60)
GFR calc non Af Amer: 55 mL/min — ABNORMAL LOW (ref >60)
Glucose, Bld: 184 mg/dL — ABNORMAL HIGH (ref 70–99)
Potassium: 3.3 mmol/L — ABNORMAL LOW (ref 3.5–5.1)
Sodium: 138 mmol/L (ref 135–145)

## 2019-09-13 LAB — HEMOGLOBIN A1C
Hgb A1c MFr Bld: 7.7 % — ABNORMAL HIGH (ref 4.8–5.6)
Mean Plasma Glucose: 174 mg/dL

## 2019-09-13 LAB — GLUCOSE, CAPILLARY
Glucose-Capillary: 134 mg/dL — ABNORMAL HIGH (ref 70–99)
Glucose-Capillary: 136 mg/dL — ABNORMAL HIGH (ref 70–99)
Glucose-Capillary: 69 mg/dL — ABNORMAL LOW (ref 70–99)
Glucose-Capillary: 86 mg/dL (ref 70–99)
Glucose-Capillary: 122 mg/dL — ABNORMAL HIGH (ref 70–99)

## 2019-09-13 LAB — MAGNESIUM: Magnesium: 1.6 mg/dL — ABNORMAL LOW (ref 1.7–2.4)

## 2019-09-13 MED ORDER — MAGNESIUM SULFATE 2 GM/50ML IV SOLN
2.0000 g | Freq: Once | INTRAVENOUS | Status: AC
  Administered 2019-09-13: 2 g via INTRAVENOUS
  Filled 2019-09-13: qty 50

## 2019-09-13 MED ORDER — CLOPIDOGREL BISULFATE 75 MG PO TABS
75.0000 mg | ORAL_TABLET | Freq: Every day | ORAL | Status: DC
  Administered 2019-09-13 – 2019-09-16 (×4): 75 mg via ORAL
  Filled 2019-09-13 (×4): qty 1

## 2019-09-13 MED ORDER — ASPIRIN EC 81 MG PO TBEC
81.0000 mg | DELAYED_RELEASE_TABLET | Freq: Every day | ORAL | Status: DC
  Administered 2019-09-13 – 2019-09-16 (×4): 81 mg via ORAL
  Filled 2019-09-13 (×4): qty 1

## 2019-09-13 MED ORDER — NITROGLYCERIN 0.4 MG SL SUBL: 0.4000 mg | SUBLINGUAL_TABLET | SUBLINGUAL | Status: DC | PRN

## 2019-09-13 MED ORDER — MELATONIN 5 MG PO TABS
5.0000 mg | ORAL_TABLET | Freq: Every day | ORAL | Status: DC
  Administered 2019-09-13 – 2019-09-15 (×3): 5 mg via ORAL
  Filled 2019-09-13 (×3): qty 1

## 2019-09-13 MED ORDER — POTASSIUM CHLORIDE CRYS ER 20 MEQ PO TBCR
40.0000 meq | EXTENDED_RELEASE_TABLET | Freq: Once | ORAL | Status: AC
  Administered 2019-09-13: 40 meq via ORAL
  Filled 2019-09-13: qty 2

## 2019-09-13 NOTE — TOC Initial Note (Signed)
Transition of Care [facility]) - Initial/Assessment Note    Patient Details  Name: Randy Howard MRN: [id number] Date of Birth: 1941/01/31  Transition of Care [facility]) CM/SW Contact:    Elease Hashimoto, LCSW Phone Number: 09/13/2019, 10:56 AM  Clinical Narrative:   Met with pt to discuss discharge plan. He plans on returning home where he has been talking care of himself. He is quite stubborn and independent, uses a cane but only outside. His sister is involved but does not stay with him. He is receptive to home health but when mention rehab he declines this option. Will need to follow and await medical issues and then get PT to evaluate pt and see how well he does with this. Continue to work on best and safest plan for pt.                Expected Discharge Plan: [place] Barriers to Discharge: Continued Medical Work up   Patient Goals and CMS Choice Patient states their goals for this hospitalization and ongoing recovery are:: I plan to go home have alwyas been able to take care of myself why not now? CMS Medicare.gov Compare Post Acute Care list provided to:: Patient Choice offered to / list presented to : Patient  Expected Discharge Plan and Services Expected Discharge Plan: [place] In-house Referral: Clinical Social Work   Post Acute Care Choice: Home Health, Durable Medical Equipment Living arrangements for the past 2 months: [place]                                      Prior Living Arrangements/Services Living arrangements for the past 2 months: Single Family Home Lives with:: Self Patient language and need for interpreter reviewed:: No Do you feel safe going back to the place where you live?: Yes      Need for Family Participation in Patient Care: Yes (Comment) Care giver support system in place?: No (comment) Current home services: DME (cane) Criminal Activity/Legal Involvement Pertinent to Current  Situation/Hospitalization: No - Comment as needed  Activities of Daily Living Home Assistive Devices/Equipment: Cane (specify quad or straight) ADL Screening (condition at time of admission) Patient's cognitive ability adequate to safely complete daily activities?: Yes Is the patient deaf or have difficulty hearing?: No Does the patient have difficulty seeing, even when wearing glasses/contacts?: No Does the patient have difficulty concentrating, remembering, or making decisions?: No Patient able to express need for assistance with ADLs?: Yes Does the patient have difficulty dressing or bathing?: Yes Independently performs ADLs?: Yes (appropriate for developmental age) Does the patient have difficulty walking or climbing stairs?: Yes Weakness of Legs: Both Weakness of Arms/Hands: Both  Permission Sought/Granted Permission sought to share information with : Family Supports Permission granted to share information with : Yes, Verbal Permission Granted  Share Information with NAME: Cletz     Permission granted to share info w Relationship: sister     Emotional Assessment Appearance:: Appears stated age Attitude/Demeanor/Rapport: Self-Absorbed Affect (typically observed): Adaptable, Restless Orientation: : Oriented to Self, Oriented to Place, Oriented to  Time, Oriented to Situation   Psych Involvement: No (comment)  Admission diagnosis:  Cellulitis of both feet [L03.115, L03.116] Other acute osteomyelitis of right foot ([place]) [M86.171] Ulcer of right foot with bone involvement without evidence of necrosis [facility]) [L97.516] Patient Active Problem List   Diagnosis Date Noted  .  Cellulitis of both feet 09/09/2019  . Acute osteomyelitis of right foot ([place]) 09/09/2019  . Systolic heart failure ([place]) 07/22/2019  . Fall at home, initial encounter 07/21/2019  . Fall 07/21/2019  . Atherosclerotic vascular disease 09/23/2018  . Atherosclerosis of native arteries of extremity with rest pain  ([place]) 09/10/2018  . Swelling of limb 09/01/2017  . CKD (chronic kidney disease), stage III 06/09/2017  . BPH (benign prostatic hyperplasia) 12/29/2016  . Insomnia 11/12/2016  . Osteoarthritis of multiple joints 07/28/2016  . Neuropathy 07/04/2016  . Early satiety 11/05/2015  . Weight loss 11/05/2015  . Benign head tremor 07/30/2015  . Tremor 06/04/2015  . Inguinal hernia 06/04/2015  . Anxiety disorder 03/07/2015  . Atherosclerosis of native arteries of the extremities with ulceration ([place]) 11/01/2014  . Hypertension 10/06/2014  . Elevated cholesterol 10/06/2014  . Chronic venous insufficiency 10/06/2014  . Peripheral vascular disease ([place]) 10/06/2014  . Difficulty in walking 08/18/2013  . Dizziness 08/18/2013  . CAD (coronary artery disease) 08/16/2013  . Cardiac murmur 08/16/2013  . Breath shortness 08/16/2013  . Chest pain 08/16/2013  . Type 2 diabetes mellitus with stage 3 chronic kidney disease ([place]) 08/16/2013   PCP:  Olin Hauser, DO Pharmacy:   [facility], [address] [place] [address] Phone: [phone] Fax: [phone]     Social Determinants of Health (SDOH) Interventions    Readmission Risk Interventions No flowsheet data found.

## 2019-09-13 NOTE — TOC Progression Note (Signed)
Transition of Care [facility]) - Progression Note    Patient Details  Name: Randy Howard MRN: [id number] Date of Birth: 07/18/1940  Transition of Care [facility]) CM/SW Contact  Shreyas Piatkowski, [place], LCSW Phone Number: 09/13/2019, 1:49 PM  Clinical Narrative:  According to MD pt will not have surgery while here but will follow up as an OP. Am searching for home health agency to take his [place] and PT to evaluate pt. Work on discharge needs, used cane prior to admission     Expected Discharge Plan: [place] Barriers to Discharge: Continued Medical Work up  Expected Discharge Plan and Services Expected Discharge Plan: [place] In-house Referral: Clinical Social Work   Post Acute Care Choice: Home Health, Durable Medical Equipment Living arrangements for the past 2 months: Single Family Home                                       Social Determinants of Health (SDOH) Interventions    Readmission Risk Interventions No flowsheet data found.

## 2019-09-13 NOTE — Progress Notes (Signed)
1 Day Post-Op   Subjective/Chief Complaint: Patient seen.  Still complains of pain in his feet.  Somewhat discouraged about sitting around in the hospital.   Objective: Vital signs in last 24 hours: Temp:  [97.5 F (36.4 C)-98.2 F (36.8 C)] 97.5 F (36.4 C) (06/22 0748) Pulse Rate:  [62-72] 62 (06/22 0748) Resp:  [12-23] 16 (06/22 0748) BP: (97-118)/(53-73) 110/61 (06/22 0748) SpO2:  [92 %-100 %] 97 % (06/22 0748) Last BM Date: 09/11/19  Intake/Output from previous day: 06/21 0701 - 06/22 0700 In: 1250 [I.V.:900; IV Piggyback:350] Out: 5025 [Urine:5025] Intake/Output this shift: No intake/output data recorded.  Bandages on both feet are dry and intact.  Upon removal no significant drainage is noted from the plantar ulcerations or the ulceration on the right hallux.  Edema in the lower extremities has significantly improved.  No expressible purulence in the foot and skin changes appear more chronic with no clear acute significant cellulitis.      Lab Results:  No results for input(s): WBC, HGB, HCT, PLT in the last 72 hours. BMET Recent Labs    09/12/19 0338 09/13/19 0413  NA 138 138  K 3.5 3.3*  CL 107 105  CO2 25 27  GLUCOSE 210* 184*  BUN 39* 33*  CREATININE 1.36* 1.23  CALCIUM 7.6* 7.7*   PT/INR No results for input(s): LABPROT, INR in the last 72 hours. ABG No results for input(s): PHART, HCO3 in the last 72 hours.  Invalid input(s): PCO2, PO2  Studies/Results: PERIPHERAL VASCULAR CATHETERIZATION  Result Date: 09/12/2019 See op note   Anti-infectives: Anti-infectives (From admission, onward)   Start     Dose/Rate Route Frequency Ordered Stop   09/13/19 0000  ceFAZolin (ANCEF) IVPB 1 g/50 mL premix  Status:  Discontinued       Note to Pharmacy: To be given in specials   1 g 100 mL/hr over 30 Minutes Intravenous 30 min pre-op 09/12/19 1035 09/12/19 1049   09/13/19 0000  clindamycin (CLEOCIN) IVPB 300 mg  Status:  Discontinued       Note to  Pharmacy: To be given in specials   300 mg 100 mL/hr over 30 Minutes Intravenous 30 min pre-op 09/12/19 1052 09/12/19 1152   09/12/19 2200  vancomycin (VANCOREADY) IVPB 1250 mg/250 mL     Discontinue     1,250 mg 166.7 mL/hr over 90 Minutes Intravenous Every 24 hours 09/12/19 1516     09/12/19 1102  clindamycin (CLEOCIN) 300 MG/50ML IVPB  Status:  Discontinued       Note to Pharmacy: Carlynn Spry   : cabinet override      09/12/19 1102 09/12/19 1156   09/11/19 2200  vancomycin (VANCOCIN) IVPB 1000 mg/200 mL premix  Status:  Discontinued        1,000 mg 200 mL/hr over 60 Minutes Intravenous Every 24 hours 09/11/19 1037 09/12/19 1516   09/10/19 2200  vancomycin (VANCOREADY) IVPB 750 mg/150 mL  Status:  Discontinued        750 mg 150 mL/hr over 60 Minutes Intravenous Every 24 hours 09/09/19 2103 09/11/19 1037   09/09/19 2200  ceFEPIme (MAXIPIME) 2 g in sodium chloride 0.9 % 100 mL IVPB     Discontinue     2 g 200 mL/hr over 30 Minutes Intravenous Every 12 hours 09/09/19 2104     09/09/19 2115  vancomycin (VANCOREADY) IVPB 1750 mg/350 mL        1,750 mg 175 mL/hr over 120 Minutes Intravenous  Once 09/09/19 2100  09/10/19 0345   09/09/19 1945  vancomycin (VANCOCIN) IVPB 1000 mg/200 mL premix  Status:  Discontinued        1,000 mg 200 mL/hr over 60 Minutes Intravenous  Once 09/09/19 1940 09/09/19 2059   09/09/19 1945  cefTRIAXone (ROCEPHIN) 2 g in sodium chloride 0.9 % 100 mL IVPB        2 g 200 mL/hr over 30 Minutes Intravenous  Once 09/09/19 1940 09/09/19 2145      Assessment/Plan: s/p Procedure(s): Lower Extremity Angiography (Right) Assessment: 1.  Significant peripheral vascular disease.2.  Full-thickness ulcerations bilateral with osteomyelitis.   Plan: Betadine and dressings reapplied to the ulcerations on both feet.  Discussed with the patient that the results of his angiogram yesterday were not promising with poor blood flow to the right foot that may not be able to be  improved.  In speaking with Dr. Lucky Cowboy he stated yesterday that we may have to hold off until next week due to his kidney function for angiogram on the left lower extremity.  Discussed with the patient that he is at significant risk for higher amputation on the right leg with the left undetermined at this point.  The feet actually appear fairly stable at this point and pending vascular recommendations patient may be stable enough for outpatient antibiotics and wound care until his circulation in his left lower extremity can be evaluated next week.  At this point we will put on hold any urgent debridements.  Follow periodically while in the hospital.  LOS: 4 days    Durward Fortes 09/13/2019

## 2019-09-13 NOTE — Progress Notes (Signed)
[facility] Daily Progress Note   Subjective: 09/12/19: 1. Ultrasound guidance for vascular access left femoral artery 2. Catheter placement into right common femoral artery from left femoral approach 3. Aortogram and selective right lower extremity angiogram 4. StarClose closure device left femoral artery  Patient lethargic today. No acute distress  Objective: Vitals:   09/12/19 1430 09/12/19 1450 09/12/19 2355 09/13/19 0748  BP: 118/73 111/66 (!) 106/55 110/61  Pulse: 72 63 62 62  Resp: 17  18 16   Temp:  97.8 F (36.6 C) 98.2 F (36.8 C) (!) 97.5 F (36.4 C)  TempSrc:  Oral Oral Oral  SpO2: 98% 100% 96% 97%  Weight:      Height:        Intake/Output Summary (Last 24 hours) at 09/13/2019 1315 Last data filed at 09/13/2019 1204 Gross per 24 hour  Intake 1250 ml  Output 5025 ml  Net -3775 ml   Physical Exam: A&Ox3, NAD CV: RRR Pulmonary: CTA Bilaterally Abdomen: Soft, Nontender, Nondistended Left Groin:  Dressing clean, dry and intact Vascular:  Right Lower Extremity: Thigh soft.  Calf soft.  Unable to palpate pedal pulses.  Wounds stable.   Left Lower Extremity: Thigh soft.  Calf soft.  Unable to palpate pedal pulses.  Wounds stable.   Laboratory: CBC    Component Value Date/Time   WBC 14.1 (H) 09/10/2019 0520   HGB 15.1 09/10/2019 0520   HCT 43.6 09/10/2019 0520   PLT 169 09/10/2019 0520   BMET    Component Value Date/Time   NA 138 09/13/2019 0413   NA 142 07/30/2013 0000   NA 141 01/05/2012 0724   K 3.3 (L) 09/13/2019 0413   K 3.8 01/05/2012 0724   CL 105 09/13/2019 0413   CL 106 01/05/2012 0724   CO2 27 09/13/2019 0413   CO2 28 01/05/2012 0724   GLUCOSE 184 (H) 09/13/2019 0413   GLUCOSE 152 (H) 01/05/2012 0724   BUN 33 (H) 09/13/2019 0413   BUN 15 07/30/2013 0000   BUN 24 (H) 01/05/2012 0724   CREATININE 1.23 09/13/2019 0413   CREATININE 1.27 (H) 10/25/2018 0917    CALCIUM 7.7 (L) 09/13/2019 0413   CALCIUM 9.0 01/05/2012 0724   GFRNONAA 55 (L) 09/13/2019 0413   GFRNONAA 53 (L) 10/25/2018 0917   GFRAA >60 09/13/2019 0413   GFRAA 62 10/25/2018 0917   Assessment/Planning: The patient is an 79 year old male with severe peripheral artery disease to the bilateral extremities status post right lower extremity angiogram - POD#1  1) Unfortunately, was not able to revascularize the right lower extremity during angiogram.  At this point, we have exhausted endovascular intervention the patient is not a candidate for open bypass.  Patient is at high risk of tissue loss and need for amputation. 2) Stopped morphine for breakthrough as the patient was very lethargic this afternoon. 3) PT/OT/ambulation 4) Plan for left lower extremity angiogram with possible intervention on Monday.  Patient does not have to stay in house and can be discharged home with a plan for him to return as an outpatient on Monday. 5) Restart ASA / Plavix  Discussed with Dr. Ellis Parents Evonte Prestage PA-C 09/13/2019 1:15 PM

## 2019-09-13 NOTE — Evaluation (Signed)
Physical Therapy Evaluation Patient Details Name: Randy Howard MRN: [id number] DOB: [birth year] Today's Date: 09/13/2019   History of Present Illness  Per MD notes: Pt is an 79 y.o. male with medical history significant of coronary artery disease, hypertension, hyperlipidemia, peripheral neuropathy, peripheral vascular disease, BPH, history of skin cancer, history of stent placement, history of carotid artery disease who is being followed by vascular surgery presented to the ER with bilateral foot ulcers.  MD assessment includes: right foot osteomyelitis, severe peripheral artery disease to the bilateral extremities status post right lower extremity angiogram 09/12/19, unable to revascularize the right lower extremity during procedure, chronic venous insufficiency, and AKI with CKD stage IIIa.    Clinical Impression  Pt pleasant and motivated to participate during the session.  Pt lethargic initially but improved quickly with activity.  Unfortunately pt did have an instance of vomiting during the session with nsg aware.  Pt was functionally weak during the session requiring significant time and effort to go from sup to sit and needing min A to come to standing.  Pt was only able to take several very effortful steps with min A for stability before returning to sitting.  Pt has a history of falls and with his combination of current weakness and instability is at a very high risk for future falls making him unsafe to return to his prior living situation at this time.  Pt will benefit from PT services in a SNF setting upon discharge to safely address deficits listed in patient problem list for decreased caregiver assistance and eventual return to PLOF.      Follow Up Recommendations SNF    Equipment Recommendations  3in1 (PT)    Recommendations for Other Services       Precautions / Restrictions Precautions Precautions: Fall Restrictions Other Position/Activity Restrictions: Per Dr. Cleda Mccreedy ok for  pt to gait train and stair train with grip socks, encourage pt to weight bear through the heels as much as possible and have pt don surgical shoes if possible.      Mobility  Bed Mobility Overal bed mobility: Modified Independent             General bed mobility comments: Extra time and effort but no physical assistance needed  Transfers Overall transfer level: Needs assistance Equipment used: Rolling walker (2 wheeled) Transfers: Sit to/from Stand Sit to Stand: Min assist;From elevated surface         General transfer comment: Min A to come to standing from an elevated surface and for eccentric control during stand to sit  Ambulation/Gait Ambulation/Gait assistance: Min assist Gait Distance (Feet): 4 Feet Assistive device: Rolling walker (2 wheeled) Gait Pattern/deviations: Step-to pattern;Trunk flexed Gait velocity: decreased   General Gait Details: Min A for stability with cues for amb closer to the RW with pt fatigued after limited amb  Stairs            Wheelchair Mobility    Modified Rankin (Stroke Patients Only)       Balance Overall balance assessment: Needs assistance   Sitting balance-Leahy Scale: Fair     Standing balance support: Bilateral upper extremity supported Standing balance-Leahy Scale: Poor                               Pertinent Vitals/Pain Pain Assessment: No/denies pain    Home Living Family/patient expects to be discharged to:: Private residence Living Arrangements: Alone Available Help at Discharge: Friend(s);Available  PRN/intermittently Type of Home: House Home Access: Stairs to enter Entrance Stairs-Rails: Right Entrance Stairs-Number of Steps: 4 Home Layout: One level Home Equipment: Cane - single point;Walker - 2 wheels Additional Comments: Kasandra Knudsen is a Automotive engineer.    Prior Function Level of Independence: Independent with assistive device(s)         Comments: Mod Ind amb with a walking  stick limited community distances, "a few" falls in the last 6 months, Ind with ADLs     Hand Dominance        Extremity/Trunk Assessment   Upper Extremity Assessment Upper Extremity Assessment: Generalized weakness    Lower Extremity Assessment Lower Extremity Assessment: Generalized weakness       Communication   Communication: No difficulties  Cognition Arousal/Alertness: Awake/alert Behavior During Therapy: WFL for tasks assessed/performed Overall Cognitive Status: Within Functional Limits for tasks assessed                                        General Comments      Exercises Total Joint Exercises Ankle Circles/Pumps: Strengthening;Both;10 reps Quad Sets: Strengthening;Both;10 reps Gluteal Sets: Strengthening;Both;10 reps Heel Slides: AROM;Both;5 reps Hip ABduction/ADduction: AROM;Both;5 reps Long Arc Quad: Strengthening;Both;10 reps;15 reps Knee Flexion: Strengthening;Both;10 reps;15 reps   Assessment/Plan    PT Assessment Patient needs continued PT services  PT Problem List Decreased strength;Decreased activity tolerance;Decreased balance;Decreased mobility;Decreased knowledge of use of DME       PT Treatment Interventions Gait training;Stair training;Functional mobility training;Therapeutic activities;Balance training;Therapeutic exercise;DME instruction;Patient/family education    PT Goals (Current goals can be found in the Care Plan section)  Acute Rehab PT Goals Patient Stated Goal: To get stronger PT Goal Formulation: With patient Time For Goal Achievement: 09/26/19 Potential to Achieve Goals: Fair    Frequency Min 2X/week   Barriers to discharge Inaccessible home environment;Decreased caregiver support      Co-evaluation               AM-PAC PT "6 Clicks" Mobility  Outcome Measure Help needed turning from your back to your side while in a flat bed without using bedrails?: A Little Help needed moving from lying on  your back to sitting on the side of a flat bed without using bedrails?: A Little Help needed moving to and from a bed to a chair (including a wheelchair)?: A Little Help needed standing up from a chair using your arms (e.g., wheelchair or bedside chair)?: A Little Help needed to walk in hospital room?: A Lot Help needed climbing 3-5 steps with a railing? : Total 6 Click Score: 15    End of Session Equipment Utilized During Treatment: Gait belt Activity Tolerance: Patient tolerated treatment well Patient left: with call bell/phone within reach;Other (comment) (On BSC with CNA aware and returning to check on pt) Nurse Communication: Mobility status (Pt with N&V) PT Visit Diagnosis: Unsteadiness on feet (R26.81);History of falling (Z91.81);Muscle weakness (generalized) (M62.81);Difficulty in walking, not elsewhere classified (R26.2)    Time: 5366-4403 PT Time Calculation (min) (ACUTE ONLY): 34 min   Charges:   PT Evaluation $PT Eval Moderate Complexity: 1 Mod PT Treatments $Therapeutic Exercise: 8-22 mins        D. Royetta Asal PT, DPT 09/13/19, 5:42 PM

## 2019-09-13 NOTE — Progress Notes (Signed)
PROGRESS NOTE    JOHNATON Howard  GGY:[id number] DOB: 08-21-1940 DOA: 09/09/2019 PCP: Olin Hauser, DO   Brief Narrative:  Randy Howard is a 79 y.o. male with medical history significant of coronary artery disease, hypertension, hyperlipidemia, peripheral neuropathy, peripheral vascular disease, BPH, history of skin cancer, history of stent placement, history of carotid artery disease who is being followed by vascular surgery to continue presenting to the ER today with bilateral feet ulcers.  Patient was known to have vascular insufficiency as well as venous stasis dermatitis.  It appears he has multiple venous ulcers bilaterally.  He has pain in his feet bilaterally. Imaging of both feet shows osteomyelitis on right foot with visible ulceration and cellulitis of left foot.  Podiatry was consulted and he was admitted with broad-spectrum antibiotics.  Subjective: Patient was again irritated when seen today, stating that he was unable to sleep last night.  He continued to have pain in his right foot and leg.  Assessment & Plan:   Principal Problem:   Acute osteomyelitis of right foot (HCC) Active Problems:   Hypertension   Chronic venous insufficiency   Peripheral vascular disease (HCC)   CAD (coronary artery disease)   Anxiety disorder   CKD (chronic kidney disease), stage III   Cellulitis of both feet  right foot osteomyelitis: Involving the great toe.  Also related to patient's vascular ulceration and cellulitis.  Patient has a recent wound culture done at wound clinic which was positive for Enterobacter cloacae and Enterococcus faecalis.  He was placed on cefepime and vancomycin on admission which will cover both of those bacteria along with Pseudomonas. -MRI with right hallux and a possible third metatarsal osteomyelitis along with left third metatarsal osteomyelitis. -Podiatry was consulted-patient had poor circulation in right foot after angiography yesterday.  Podiatry  is considering continuation of p.o. antibiotics and outpatient follow-up before making further decision regarding amputation. -PT evaluation as patient lives alone for disposition. -Also need outpatient wound care. -Continue cefepime and vancomycin-will be discharged home on p.o. Augmentin and Cipro according to podiatrist recommendations.  Severe peripheral arterial disease.  Patient has very weak peripheral pulses.  Has an history of bilateral vascular ulcers and cellulitis. Vascular surgery was also consulted on admission-patient has an history of multiple procedures on right lower extremity.  Current angiography with poor circulation in the right foot.  They will do repeat angiography on left lower extremity in 1 to 2 weeks as an outpatient to prevent renal injury.  -Patient underwent right lower extremity angiogram with vascular surgery today which demonstrated a flush occlusion of the right SFA with a patulous common femoral artery from his previous endarterectomy.  The profunda femoris is widely patent and collateralizes well to the above-knee popliteal artery.  There is an occlusion in the mid popliteal artery with the below-knee popliteal artery reconstituting.  The peroneal artery was the dominant runoff to the foot with a somewhat diseased posterior tibial artery seen going distally as well although the degree of disease was difficult to discern. -Unna boot after the procedure. -Continue Plavix and statin.  Chronic venous insufficiency. -Continue home dose of torsemide.  CAD.  No acute concern. -Continue to monitor and home meds.  AKI with CKD stage IIIa.  Likley prerenal.  Slight improvement of creatinine this morning. -Avoid nephrotoxins -continue to monitor  History of anxiety disorder. -Continue home meds.  Objective: Vitals:   09/12/19 1430 09/12/19 1450 09/12/19 2355 09/13/19 0748  BP: 118/73 111/66 (!) 106/55 110/61  Pulse: 72 63 62 62  Resp: 17  18 16   Temp:  97.8 F  (36.6 C) 98.2 F (36.8 C) (!) 97.5 F (36.4 C)  TempSrc:  Oral Oral Oral  SpO2: 98% 100% 96% 97%  Weight:      Height:        Intake/Output Summary (Last 24 hours) at 09/13/2019 1400 Last data filed at 09/13/2019 1204 Gross per 24 hour  Intake 1250 ml  Output 5025 ml  Net -3775 ml   Filed Weights   09/09/19 1410 09/09/19 2316  Weight: 68.5 kg 77.9 kg    Examination:  General exam: Chronically ill-appearing elderly man, appears calm and comfortable  Respiratory system: Clear to auscultation. Respiratory effort normal. Cardiovascular system: S1 & S2 heard, RRR. No JVD, murmurs, rubs, Gastrointestinal system: Soft, nontender, nondistended, bowel sounds positive. Central nervous system: Alert and oriented. No focal neurological deficits. Extremities: Bilateral 2+ lower extremity edema with signs of chronic venous stasis and congestion.  Bilateral scaling and a small area of raw skin where blister ruptured on right medial extremity.  Both feet with clean bandages. Psychiatry: Judgement and insight appear normal.   DVT prophylaxis: Lovenox Code Status: Full Family Communication: Discussed with patient Disposition Plan:  Status is: Inpatient  Remains inpatient appropriate because:Inpatient level of care appropriate due to severity of illness   Dispo: The patient is from: Home              Anticipated d/c is to: To be determined              Anticipated d/c date is: 1 day.              Patient currently is not medically stable to d/c.  Podiatry does not want to do amputation at this time, will follow up as an outpatient and decide about amputations later on after another vascular procedure in couple of weeks.  PT evaluation was ordered to determine disposition needs.  Should be able to go home with home health tomorrow, if SNF is recommended then it might take another day or 2.  Consultants:   Podiatry  Vascular surgery  Procedures:  Antimicrobials:   Cefepime Vancomycin  Data Reviewed: I have personally reviewed following labs and imaging studies  CBC: Recent Labs  Lab 09/09/19 1412 09/10/19 0520  WBC 12.7* 14.1*  NEUTROABS 11.2*  --   HGB 14.2 15.1  HCT 42.1 43.6  MCV 88.6 87.7  PLT 196 725   Basic Metabolic Panel: Recent Labs  Lab 09/09/19 1412 09/10/19 0520 09/11/19 0507 09/12/19 0338 09/13/19 0413  NA 138 139 138 138 138  K 4.5 4.9 3.6 3.5 3.3*  CL 104 107 107 107 105  CO2 22 22 26 25 27   GLUCOSE 204* 179* 141* 210* 184*  BUN 35* 44* 44* 39* 33*  CREATININE 1.68* 1.77* 1.63* 1.36* 1.23  CALCIUM 9.0 8.4* 7.8* 7.6* 7.7*  MG  --   --   --   --  1.6*   GFR: Estimated Creatinine Clearance: 51 mL/min (by C-G formula based on SCr of 1.23 mg/dL). Liver Function Tests: Recent Labs  Lab 09/09/19 1412 09/10/19 0520  AST 188* 81*  ALT 97* 65*  ALKPHOS 89 63  BILITOT 2.0* 1.3*  PROT 6.5 4.6*  ALBUMIN 3.0* 2.2*   No results for input(s): LIPASE, AMYLASE in the last 168 hours. No results for input(s): AMMONIA in the last 168 hours. Coagulation Profile: No results for input(s): INR, PROTIME in the  last 168 hours. Cardiac Enzymes: No results for input(s): CKTOTAL, CKMB, CKMBINDEX, TROPONINI in the last 168 hours. BNP (last 3 results) No results for input(s): PROBNP in the last 8760 hours. HbA1C: Recent Labs    09/12/19 0341  HGBA1C 7.7*   CBG: Recent Labs  Lab 09/12/19 1416 09/12/19 1631 09/12/19 2048 09/13/19 0750 09/13/19 1200  GLUCAP 72 139* 147* 134* 136*   Lipid Profile: No results for input(s): CHOL, HDL, LDLCALC, TRIG, CHOLHDL, LDLDIRECT in the last 72 hours. Thyroid Function Tests: No results for input(s): TSH, T4TOTAL, FREET4, T3FREE, THYROIDAB in the last 72 hours. Anemia Panel: No results for input(s): VITAMINB12, FOLATE, FERRITIN, TIBC, IRON, RETICCTPCT in the last 72 hours. Sepsis Labs: Recent Labs  Lab 09/09/19 1412 09/09/19 2006  LATICACIDVEN 2.8* 2.5*    Recent Results  (from the past 240 hour(s))  Blood culture (routine x 2)     Status: None (Preliminary result)   Collection Time: 09/09/19  8:06 PM   Specimen: BLOOD  Result Value Ref Range Status   Specimen Description BLOOD LEFT ANTECUBITAL  Final   Special Requests   Final    BOTTLES DRAWN AEROBIC AND ANAEROBIC Blood Culture results may not be optimal due to an inadequate volume of blood received in culture bottles   Culture   Final    NO GROWTH 4 DAYS Performed at [facility], [address], [place], [place] [address]    Report Status PENDING  Incomplete  Blood culture (routine x 2)     Status: None (Preliminary result)   Collection Time: 09/09/19  8:06 PM   Specimen: BLOOD  Result Value Ref Range Status   Specimen Description BLOOD LEFT HAND  Final   Special Requests   Final    BOTTLES DRAWN AEROBIC AND ANAEROBIC Blood Culture adequate volume   Culture   Final    NO GROWTH 4 DAYS Performed at [facility], [address], [place],  [address]    Report Status PENDING  Incomplete  SARS Coronavirus 2 by RT PCR (hospital order, performed in [name] hospital lab) Nasopharyngeal Nasopharyngeal Swab     Status: None   Collection Time: 09/09/19 10:15 PM   Specimen: Nasopharyngeal Swab  Result Value Ref Range Status   SARS Coronavirus 2 NEGATIVE NEGATIVE Final    Comment: (NOTE) SARS-CoV-2 target nucleic acids are NOT DETECTED.  The SARS-CoV-2 RNA is generally detectable in upper and lower respiratory specimens during the acute phase of infection. The lowest concentration of SARS-CoV-2 viral copies this assay can detect is 250 copies / mL. A negative result does not preclude SARS-CoV-2 infection and should not be used as the sole basis for treatment or other patient management decisions.  A negative result may occur with improper specimen collection / handling, submission of specimen other than nasopharyngeal swab, presence of viral mutation(s) within the areas  targeted by this assay, and inadequate number of viral copies (<250 copies / mL). A negative result must be combined with clinical observations, patient history, and epidemiological information.  Fact Sheet for Patients:   StrictlyIdeas.no  Fact Sheet for Healthcare Providers: BankingDealers.co.za  This test is not yet approved or  cleared by the [place] FDA and has been authorized for detection and/or diagnosis of SARS-CoV-2 by FDA under an Emergency Use Authorization (EUA).  This EUA will remain in effect (meaning this test can be used) for the duration of the COVID-19 declaration under Section 564(b)(1) of the Act, 21 U.S.C. section 360bbb-3(b)(1), unless  the authorization is terminated or revoked sooner.  Performed at [facility], [address] [place], [place] [address]      Radiology Studies: PERIPHERAL VASCULAR CATHETERIZATION  Result Date: 09/12/2019 See op note   Scheduled Meds:  aspirin EC  81 mg Oral Daily   atorvastatin  10 mg Oral Daily   benazepril  40 mg Oral Daily   clopidogrel  75 mg Oral Daily   enoxaparin (LOVENOX) injection  40 mg Subcutaneous Q24H   feeding supplement (ENSURE ENLIVE)  237 mL Oral BID BM   finasteride  5 mg Oral Daily   fluticasone  2 spray Each Nare Daily   gabapentin  100-200 mg Oral QHS   gabapentin  200 mg Oral Daily   insulin aspart  0-15 Units Subcutaneous TID WC   insulin aspart  0-5 Units Subcutaneous QHS   insulin aspart  3 Units Subcutaneous TID WC   insulin glargine  6 Units Subcutaneous QHS   melatonin  5 mg Oral QHS   metoprolol tartrate  50 mg Oral BID   multivitamin with minerals  1 tablet Oral Daily   potassium chloride  10 mEq Oral Daily   sodium chloride flush  3 mL Intravenous Once   tamsulosin  0.4 mg Oral Daily   torsemide  20 mg Oral Daily   Continuous Infusions:  ceFEPime (MAXIPIME) IV 2 g (09/13/19 1134)   vancomycin  1,250 mg (09/12/19 2312)     LOS: 4 days   Time spent: 40 minutes.  Lorella Nimrod, MD [facility]  If 7PM-7AM, please contact night-coverage [email or web address]  09/13/2019, 2:00 PM   This record has been created using Systems analyst. Errors have been sought and corrected,but may not always be located. Such creation errors do not reflect on the standard of care.

## 2019-09-14 ENCOUNTER — Inpatient Hospital Stay: Payer: Medicare Other

## 2019-09-14 LAB — CULTURE, BLOOD (ROUTINE X 2)
Culture: NO GROWTH
Culture: NO GROWTH
Special Requests: ADEQUATE

## 2019-09-14 LAB — BASIC METABOLIC PANEL
Anion gap: 8 (ref 5–15)
BUN: 33 mg/dL — ABNORMAL HIGH (ref 8–23)
CO2: 28 mmol/L (ref 22–32)
Calcium: 7.8 mg/dL — ABNORMAL LOW (ref 8.9–10.3)
Chloride: 103 mmol/L (ref 98–111)
Creatinine, Ser: 1.24 mg/dL (ref 0.61–1.24)
GFR calc Af Amer: >60 mL/min (ref >60)
GFR calc non Af Amer: 55 mL/min — ABNORMAL LOW (ref >60)
Glucose, Bld: 158 mg/dL — ABNORMAL HIGH (ref 70–99)
Potassium: 3.5 mmol/L (ref 3.5–5.1)
Sodium: 139 mmol/L (ref 135–145)

## 2019-09-14 LAB — GLUCOSE, CAPILLARY
Glucose-Capillary: 127 mg/dL — ABNORMAL HIGH (ref 70–99)
Glucose-Capillary: 96 mg/dL (ref 70–99)
Glucose-Capillary: 124 mg/dL — ABNORMAL HIGH (ref 70–99)
Glucose-Capillary: 187 mg/dL — ABNORMAL HIGH (ref 70–99)

## 2019-09-14 MED ORDER — SENNOSIDES-DOCUSATE SODIUM 8.6-50 MG PO TABS
2.0000 | ORAL_TABLET | Freq: Two times a day (BID) | ORAL | Status: DC
  Administered 2019-09-14 – 2019-09-16 (×4): 2 via ORAL
  Filled 2019-09-14 (×4): qty 2

## 2019-09-14 MED ORDER — PANTOPRAZOLE SODIUM 40 MG PO TBEC
40.0000 mg | DELAYED_RELEASE_TABLET | Freq: Every day | ORAL | Status: DC
  Administered 2019-09-14 – 2019-09-16 (×3): 40 mg via ORAL
  Filled 2019-09-14 (×3): qty 1

## 2019-09-14 MED ORDER — METOPROLOL TARTRATE 25 MG PO TABS
25.0000 mg | ORAL_TABLET | Freq: Two times a day (BID) | ORAL | Status: DC
  Administered 2019-09-15 – 2019-09-16 (×3): 25 mg via ORAL
  Filled 2019-09-14 (×6): qty 1

## 2019-09-14 NOTE — Progress Notes (Signed)
Patient's BP 89/59, HR 54. Patient reports being asymptomatic. Reported results to on-call.

## 2019-09-14 NOTE — Progress Notes (Signed)
Inpatient Diabetes Program Recommendations  AACE/ADA: New Consensus Statement on Inpatient Glycemic Control (2015)  Target Ranges:  Prepandial:   less than 140 mg/dL      Peak postprandial:   less than 180 mg/dL (1-2 hours)      Critically ill patients:  140 - 180 mg/dL   Lab Results  Component Value Date   GLUCAP 127 (H) 09/14/2019   HGBA1C 7.7 (H) 09/12/2019    Review of Glycemic Control Results for Randy Howard, Randy Howard (MRN [id number]) as of 09/14/2019 11:04  Ref. Range 09/13/2019 07:50 09/13/2019 12:00 09/13/2019 16:34 09/13/2019 18:12 09/13/2019 21:29 09/14/2019 08:11  Glucose-Capillary Latest Ref Range: 70 - 99 mg/dL 134 (H) 136 (H) 69 (L) 86 122 (H) 127 (H)   Diabetes history: DM 2 Outpatient Diabetes medications:  Amaryl 4 mg q AM Current orders for Inpatient glycemic control:  Novolog moderate tid with meals and HS Novolog 3 units tid with meals Lantus 6 units daily  Inpatient Diabetes Program Recommendations:    Note patient had mild hypoglycemia on 6/22.  Consider reducing Novolog correction to "Very sensitive" starting at 151 mg/dL.   Thanks  Adah Perl, RN, BC-ADM Inpatient Diabetes Coordinator Pager [phone] (8a-5p)

## 2019-09-14 NOTE — TOC Progression Note (Addendum)
Transition of Care [facility]) - Progression Note    Patient Details  Name: Randy Howard MRN: [id number] Date of Birth: 01/16/1941  [facility] CM/SW Contact  Salisha Bardsley, Gardiner Rhyme, LCSW Phone Number: 09/14/2019, 8:56 AM  Clinical Narrative: Met with pt to discuss discharge plan. He reports: "They are going to cut my legs off." Discussed the plan if this doesn't happen and he goes home or to a facility first. He says his home is cluttered and has a lot of stuff. When discussed going to a SNF he will think about it. Will begin work up for this due to it doesn't seem pt can go home alone and manage.     1:30 PM Have done a bed search await bed offers  Expected Discharge Plan: [place] Barriers to Discharge: Continued Medical Work up  Expected Discharge Plan and Services Expected Discharge Plan: [place] In-house Referral: Clinical Social Work   Post Acute Care Choice: Home Health, Durable Medical Equipment Living arrangements for the past 2 months: Single Family Home                                       Social Determinants of Health (SDOH) Interventions    Readmission Risk Interventions No flowsheet data found.

## 2019-09-14 NOTE — Progress Notes (Signed)
PROGRESS NOTE    Randy Howard  OMA:[id number] DOB: 07/28/1940 DOA: 09/09/2019 PCP: Olin Hauser, DO   Brief Narrative:  Randy Howard is a 79 y.o. male with medical history significant of coronary artery disease, hypertension, hyperlipidemia, peripheral neuropathy, peripheral vascular disease, BPH, history of skin cancer, history of stent placement, history of carotid artery disease who is being followed by vascular surgery to continue presenting to the ER today with bilateral feet ulcers.  Patient was known to have vascular insufficiency as well as venous stasis dermatitis.  It appears he has multiple venous ulcers bilaterally.  He has pain in his feet bilaterally. Imaging of both feet shows osteomyelitis on right foot with visible ulceration and cellulitis of left foot.  Podiatry was consulted and he was admitted with broad-spectrum antibiotics, vascular surgery did angiography which shows poor circulation to right foot.  Podiatry will now wait with p.o. antibiotics and follow-up as an outpatient in 2 weeks to decide about amputation.  Subjective: Was complaining of pills sticking to his chest.  Per patient he is having progressively worsening difficulty with swallowing as solid food and pills get stuck.  He thinks he is having worsening of GERD symptoms.  Assessment & Plan:   Principal Problem:   Acute osteomyelitis of right foot (HCC) Active Problems:   Hypertension   Chronic venous insufficiency   Peripheral vascular disease (HCC)   CAD (coronary artery disease)   Anxiety disorder   CKD (chronic kidney disease), stage III   Cellulitis of both feet  right foot osteomyelitis: Involving the great toe.  Also related to patient's vascular ulceration and cellulitis.  Patient has a recent wound culture done at wound clinic which was positive for Enterobacter cloacae and Enterococcus faecalis.  He was placed on cefepime and vancomycin on admission which will cover both of those  bacteria along with Pseudomonas. -MRI with right hallux and a possible third metatarsal osteomyelitis along with left third metatarsal osteomyelitis. -Podiatry was consulted-patient had poor circulation in right foot after angiography yesterday.  Podiatry is considering continuation of p.o. antibiotics and outpatient follow-up before making further decision regarding amputation. -PT evaluation-SNF placement-TOC consulted. -Also need outpatient wound care. -Continue cefepime and vancomycin-will be discharged home on p.o. Augmentin and Cipro according to podiatrist recommendations.  Severe peripheral arterial disease.  Patient has very weak peripheral pulses.  Has an history of bilateral vascular ulcers and cellulitis. Vascular surgery was also consulted on admission-patient has an history of multiple procedures on right lower extremity.  Current angiography with poor circulation in the right foot.  They will do repeat angiography on left lower extremity in 1 to 2 weeks as an outpatient to prevent renal injury.  -Patient underwent right lower extremity angiogram with vascular surgery today which demonstrated a flush occlusion of the right SFA with a patulous common femoral artery from his previous endarterectomy.  The profunda femoris is widely patent and collateralizes well to the above-knee popliteal artery.  There is an occlusion in the mid popliteal artery with the below-knee popliteal artery reconstituting.  The peroneal artery was the dominant runoff to the foot with a somewhat diseased posterior tibial artery seen going distally as well although the degree of disease was difficult to discern. -Unna boot after the procedure. -Continue Plavix and statin.  Dysphagia.  We obtain barium swallow esophagogram due to his complaint of pills sticking to his chest.  It shows mild tertiary contractions with no obvious obstruction. -Crush pills if they are bigger in size. -  PPI for GERD symptoms.  Chronic  venous insufficiency. -Continue home dose of torsemide.  CAD.  No acute concern. -Continue to monitor and home meds.  AKI with CKD stage IIIa.  Likley prerenal.  Slight improvement of creatinine this morning. -Avoid nephrotoxins -continue to monitor  History of anxiety disorder. -Continue home meds.  Objective: Vitals:   09/14/19 0106 09/14/19 0138 09/14/19 0809 09/14/19 1558  BP: (!) 89/59 (!) 94/54 (!) 90/49 (!) 84/39  Pulse: (!) 54 (!) 52 (!) 51 (!) 57  Resp:   16 18  Temp: 97.9 F (36.6 C)  97.6 F (36.4 C) (!) 97.5 F (36.4 C)  TempSrc: Oral  Oral Oral  SpO2: 93%  99% 96%  Weight:      Height:        Intake/Output Summary (Last 24 hours) at 09/14/2019 1740 Last data filed at 09/14/2019 1620 Gross per 24 hour  Intake 240 ml  Output 1775 ml  Net -1535 ml   Filed Weights   09/09/19 1410 09/09/19 2316  Weight: 68.5 kg 77.9 kg    Examination:  General exam: Chronically ill-appearing elderly man, appears calm and comfortable  Respiratory system: Clear to auscultation. Respiratory effort normal. Cardiovascular system: S1 & S2 heard, RRR. No JVD, murmurs, rubs, Gastrointestinal system: Soft, nontender, nondistended, bowel sounds positive. Central nervous system: Alert and oriented. No focal neurological deficits. Extremities: Bilateral 2+ lower extremity edema with signs of chronic venous stasis and congestion.  Bilateral scaling and a small area of raw skin where blister ruptured on right medial extremity.  Both feet with clean bandages. Psychiatry: Judgement and insight appear normal.   DVT prophylaxis: Lovenox Code Status: Full Family Communication: Discussed with patient Disposition Plan:  Status is: Inpatient  Remains inpatient appropriate because:Inpatient level of care appropriate due to severity of illness   Dispo: The patient is from: Home              Anticipated d/c is to: To be determined              Anticipated d/c date is: 1 day.               Patient currently is not medically stable to d/c.  Podiatry does not want to do amputation at this time, will follow up as an outpatient and decide about amputations later on after another vascular procedure in couple of weeks.  PT evaluation was ordered to determine disposition needs.  Should be able to go home with home health tomorrow, if SNF is recommended then it might take another day or 2.  Consultants:   Podiatry  Vascular surgery  Procedures:  Antimicrobials:  Cefepime Vancomycin  Data Reviewed: I have personally reviewed following labs and imaging studies  CBC: Recent Labs  Lab 09/09/19 1412 09/10/19 0520  WBC 12.7* 14.1*  NEUTROABS 11.2*  --   HGB 14.2 15.1  HCT 42.1 43.6  MCV 88.6 87.7  PLT 196 867   Basic Metabolic Panel: Recent Labs  Lab 09/10/19 0520 09/11/19 0507 09/12/19 0338 09/13/19 0413 09/14/19 0546  NA 139 138 138 138 139  K 4.9 3.6 3.5 3.3* 3.5  CL 107 107 107 105 103  CO2 22 26 25 27 28   GLUCOSE 179* 141* 210* 184* 158*  BUN 44* 44* 39* 33* 33*  CREATININE 1.77* 1.63* 1.36* 1.23 1.24  CALCIUM 8.4* 7.8* 7.6* 7.7* 7.8*  MG  --   --   --  1.6*  --    GFR:  Estimated Creatinine Clearance: 50.6 mL/min (by C-G formula based on SCr of 1.24 mg/dL). Liver Function Tests: Recent Labs  Lab 09/09/19 1412 09/10/19 0520  AST 188* 81*  ALT 97* 65*  ALKPHOS 89 63  BILITOT 2.0* 1.3*  PROT 6.5 4.6*  ALBUMIN 3.0* 2.2*   No results for input(s): LIPASE, AMYLASE in the last 168 hours. No results for input(s): AMMONIA in the last 168 hours. Coagulation Profile: No results for input(s): INR, PROTIME in the last 168 hours. Cardiac Enzymes: No results for input(s): CKTOTAL, CKMB, CKMBINDEX, TROPONINI in the last 168 hours. BNP (last 3 results) No results for input(s): PROBNP in the last 8760 hours. HbA1C: Recent Labs    09/12/19 0341  HGBA1C 7.7*   CBG: Recent Labs  Lab 09/13/19 1812 09/13/19 2129 09/14/19 0811 09/14/19 1205 09/14/19 1559    GLUCAP 86 122* 127* 96 124*   Lipid Profile: No results for input(s): CHOL, HDL, LDLCALC, TRIG, CHOLHDL, LDLDIRECT in the last 72 hours. Thyroid Function Tests: No results for input(s): TSH, T4TOTAL, FREET4, T3FREE, THYROIDAB in the last 72 hours. Anemia Panel: No results for input(s): VITAMINB12, FOLATE, FERRITIN, TIBC, IRON, RETICCTPCT in the last 72 hours. Sepsis Labs: Recent Labs  Lab 09/09/19 1412 09/09/19 2006  LATICACIDVEN 2.8* 2.5*    Recent Results (from the past 240 hour(s))  Blood culture (routine x 2)     Status: None   Collection Time: 09/09/19  8:06 PM   Specimen: BLOOD  Result Value Ref Range Status   Specimen Description BLOOD LEFT ANTECUBITAL  Final   Special Requests   Final    BOTTLES DRAWN AEROBIC AND ANAEROBIC Blood Culture results may not be optimal due to an inadequate volume of blood received in culture bottles   Culture   Final    NO GROWTH 5 DAYS Performed at [facility], [address], [place], [place] [address]    Report Status 09/14/2019 FINAL  Final  Blood culture (routine x 2)     Status: None   Collection Time: 09/09/19  8:06 PM   Specimen: BLOOD  Result Value Ref Range Status   Specimen Description BLOOD LEFT HAND  Final   Special Requests   Final    BOTTLES DRAWN AEROBIC AND ANAEROBIC Blood Culture adequate volume   Culture   Final    NO GROWTH 5 DAYS Performed at [facility], [address] [place],  [address]    Report Status 09/14/2019 FINAL  Final  SARS Coronavirus 2 by RT PCR (hospital order, performed in [facility] hospital lab) Nasopharyngeal Nasopharyngeal Swab     Status: None   Collection Time: 09/09/19 10:15 PM   Specimen: Nasopharyngeal Swab  Result Value Ref Range Status   SARS Coronavirus 2 NEGATIVE NEGATIVE Final    Comment: (NOTE) SARS-CoV-2 target nucleic acids are NOT DETECTED.  The SARS-CoV-2 RNA is generally detectable in upper and lower respiratory specimens during the acute  phase of infection. The lowest concentration of SARS-CoV-2 viral copies this assay can detect is 250 copies / mL. A negative result does not preclude SARS-CoV-2 infection and should not be used as the sole basis for treatment or other patient management decisions.  A negative result may occur with improper specimen collection / handling, submission of specimen other than nasopharyngeal swab, presence of viral mutation(s) within the areas targeted by this assay, and inadequate number of viral copies (<250 copies / mL). A negative result must be combined with clinical observations, patient history, and  epidemiological information.  Fact Sheet for Patients:   StrictlyIdeas.no  Fact Sheet for Healthcare Providers: BankingDealers.co.za  This test is not yet approved or  cleared by the [place] FDA and has been authorized for detection and/or diagnosis of SARS-CoV-2 by FDA under an Emergency Use Authorization (EUA).  This EUA will remain in effect (meaning this test can be used) for the duration of the COVID-19 declaration under Section 564(b)(1) of the Act, 21 U.S.C. section 360bbb-3(b)(1), unless the authorization is terminated or revoked sooner.  Performed at [facility], [address] [place], [place] [address]      Radiology Studies: DG ESOPHAGUS W SINGLE CM (SOL OR THIN BA)  Result Date: 09/14/2019 CLINICAL DATA:  Difficulty swallowing, particularly large pills EXAM: ESOPHOGRAM/BARIUM SWALLOW TECHNIQUE: Single contrast examination was performed using thin barium. Barium impregnated tablet was administered as well. FLUOROSCOPY TIME:  Fluoroscopy Time:  2 minutes 54 seconds Radiation Exposure Index (if provided by the fluoroscopic device): 83.1 mGy Number of Acquired Spot Images: 32, multiple cine fluoroscopic runs COMPARISON:  None. FINDINGS: Swallowing mechanism and esophageal transit time are within normal limits.  Esophageal mucosa is within normal limits. No definitive reflux is seen. Minimal tertiary contractions are noted. No focal area of narrowing is seen. Barium impregnated tablet was administered and held briefly in the mid esophagus although no true obstructive changes seen. This reproduced symptoms similar to what the patient is experiencing. IMPRESSION: Mild tertiary contractions are noted. Mild functional obstruction which may be related to the size and dryness of the tablet within the mid esophagus. This did reproduce the patient's symptomatology although no definitive obstructive changes are seen. Electronically Signed   By: Inez Catalina M.D.   On: 09/14/2019 11:44    Scheduled Meds: . aspirin EC  81 mg Oral Daily  . atorvastatin  10 mg Oral Daily  . benazepril  40 mg Oral Daily  . clopidogrel  75 mg Oral Daily  . enoxaparin (LOVENOX) injection  40 mg Subcutaneous Q24H  . feeding supplement (ENSURE ENLIVE)  237 mL Oral BID BM  . finasteride  5 mg Oral Daily  . fluticasone  2 spray Each Nare Daily  . gabapentin  100-200 mg Oral QHS  . gabapentin  200 mg Oral Daily  . insulin aspart  0-15 Units Subcutaneous TID WC  . insulin aspart  0-5 Units Subcutaneous QHS  . insulin aspart  3 Units Subcutaneous TID WC  . insulin glargine  6 Units Subcutaneous QHS  . melatonin  5 mg Oral QHS  . metoprolol tartrate  25 mg Oral BID  . multivitamin with minerals  1 tablet Oral Daily  . pantoprazole  40 mg Oral Daily  . potassium chloride  10 mEq Oral Daily  . sodium chloride flush  3 mL Intravenous Once  . tamsulosin  0.4 mg Oral Daily  . torsemide  20 mg Oral Daily   Continuous Infusions: . ceFEPime (MAXIPIME) IV 2 g (09/14/19 1202)  . vancomycin 1,250 mg (09/13/19 2312)     LOS: 5 days   Time spent: 35 minutes.  Lorella Nimrod, MD [facility]  If 7PM-7AM, please contact night-coverage [email or web address]  09/14/2019, 5:40 PM   This record has been created using TEFL teacher. Errors have been sought and corrected,but may not always be located. Such creation errors do not reflect on the standard of care.

## 2019-09-14 NOTE — NC FL2 (Signed)
[place] LEVEL OF CARE SCREENING TOOL     IDENTIFICATION  Patient Name: Randy Howard Birthdate: 11/22/1940 Sex: male Admission Date (Current Location): 09/09/2019  [place] and [place] Number:  Engineering geologist and Address:  [facility], [address], [place], [place] [address]      Provider Number: [id number]  Attending Physician Name and Address:  Lorella Nimrod, MD  Relative Name and Phone Number:  Lestine Box sister [phone]    Current Level of Care: Hospital Recommended Level of Care: [place] Prior Approval Number:    Date Approved/Denied:   PASRR Number: [id number]  Discharge Plan: SNF    Current Diagnoses: Patient Active Problem List   Diagnosis Date Noted  . Cellulitis of both feet 09/09/2019  . Acute osteomyelitis of right foot ([place]) 09/09/2019  . Systolic heart failure ([place]) 07/22/2019  . Fall at home, initial encounter 07/21/2019  . Fall 07/21/2019  . Atherosclerotic vascular disease 09/23/2018  . Atherosclerosis of native arteries of extremity with rest pain ([place]) 09/10/2018  . Swelling of limb 09/01/2017  . CKD (chronic kidney disease), stage III 06/09/2017  . BPH (benign prostatic hyperplasia) 12/29/2016  . Insomnia 11/12/2016  . Osteoarthritis of multiple joints 07/28/2016  . Neuropathy 07/04/2016  . Early satiety 11/05/2015  . Weight loss 11/05/2015  . Benign head tremor 07/30/2015  . Tremor 06/04/2015  . Inguinal hernia 06/04/2015  . Anxiety disorder 03/07/2015  . Atherosclerosis of native arteries of the extremities with ulceration ([place]) 11/01/2014  . Hypertension 10/06/2014  . Elevated cholesterol 10/06/2014  . Chronic venous insufficiency 10/06/2014  . Peripheral vascular disease ([place]) 10/06/2014  . Difficulty in walking 08/18/2013  . Dizziness 08/18/2013  . CAD (coronary artery disease) 08/16/2013  . Cardiac murmur 08/16/2013  . Breath shortness 08/16/2013  . Chest  pain 08/16/2013  . Type 2 diabetes mellitus with stage 3 chronic kidney disease ([place]) 08/16/2013    Orientation RESPIRATION BLADDER Height & Weight     Self, Situation, Place, Time  Normal External catheter Weight: 171 lb 11.8 oz (77.9 kg) Height:  5\' 11"  (180.3 cm)  BEHAVIORAL SYMPTOMS/MOOD NEUROLOGICAL BOWEL NUTRITION STATUS      Continent Diet (carb modified thin liquids)  AMBULATORY STATUS COMMUNICATION OF NEEDS Skin   Limited Assist Verbally Surgical wounds                       Personal Care Assistance Level of Assistance  Bathing, Dressing Bathing Assistance: Limited assistance   Dressing Assistance: Limited assistance     Functional Limitations Info             SPECIAL CARE FACTORS FREQUENCY  PT (By licensed PT)     PT Frequency: 5x week              Contractures Contractures Info: Not present    Additional Factors Info  Code Status, Allergies Code Status Info: Full Code Allergies Info: oxycodone           Current Medications (09/14/2019):  This is the current hospital active medication list Current Facility-Administered Medications  Medication Dose Route Frequency Provider Last Rate Last Admin  . acetaminophen (TYLENOL) tablet 650 mg  650 mg Oral Q6H PRN Algernon Huxley, MD       Or  . acetaminophen (TYLENOL) suppository 650 mg  650 mg Rectal Q6H PRN Algernon Huxley, MD      . aspirin EC tablet 81 mg  81 mg  Oral Daily Stegmayer, Kimberly A, PA-C   81 mg at 09/14/19 0953  . atorvastatin (LIPITOR) tablet 10 mg  10 mg Oral Daily Algernon Huxley, MD   10 mg at 09/14/19 0956  . baclofen (LIORESAL) tablet 10 mg  10 mg Oral TID PRN Algernon Huxley, MD      . benazepril (LOTENSIN) tablet 40 mg  40 mg Oral Daily Algernon Huxley, MD   40 mg at 09/14/19 0956  . ceFEPIme (MAXIPIME) 2 g in sodium chloride 0.9 % 100 mL IVPB  2 g Intravenous Q12H Algernon Huxley, MD 200 mL/hr at 09/14/19 1202 2 g at 09/14/19 1202  . clopidogrel (PLAVIX) tablet 75 mg  75 mg Oral Daily  Stegmayer, Kimberly A, PA-C   75 mg at 09/14/19 0957  . enoxaparin (LOVENOX) injection 40 mg  40 mg Subcutaneous Q24H Algernon Huxley, MD   40 mg at 09/11/19 1832  . feeding supplement (ENSURE ENLIVE) (ENSURE ENLIVE) liquid 237 mL  237 mL Oral BID BM Algernon Huxley, MD   237 mL at 09/12/19 1649  . finasteride (PROSCAR) tablet 5 mg  5 mg Oral Daily Algernon Huxley, MD   5 mg at 09/14/19 0955  . fluticasone (FLONASE) 50 MCG/ACT nasal spray 2 spray  2 spray Each Nare Daily Algernon Huxley, MD   2 spray at 09/14/19 0959  . gabapentin (NEURONTIN) capsule 100-200 mg  100-200 mg Oral QHS Algernon Huxley, MD   200 mg at 09/13/19 2231  . gabapentin (NEURONTIN) capsule 200 mg  200 mg Oral Daily Algernon Huxley, MD   200 mg at 09/14/19 0955  . hydrOXYzine (ATARAX/VISTARIL) tablet 10 mg  10 mg Oral TID PRN Algernon Huxley, MD   10 mg at 09/13/19 1132  . insulin aspart (novoLOG) injection 0-15 Units  0-15 Units Subcutaneous TID WC Algernon Huxley, MD   2 Units at 09/14/19 [phone]  . insulin aspart (novoLOG) injection 0-5 Units  0-5 Units Subcutaneous QHS Algernon Huxley, MD      . insulin aspart (novoLOG) injection 3 Units  3 Units Subcutaneous TID WC Algernon Huxley, MD   3 Units at 09/14/19 0900  . insulin glargine (LANTUS) injection 6 Units  6 Units Subcutaneous QHS Algernon Huxley, MD   6 Units at 09/13/19 2237  . melatonin tablet 5 mg  5 mg Oral QHS Lorella Nimrod, MD   5 mg at 09/13/19 2232  . metoprolol tartrate (LOPRESSOR) tablet 25 mg  25 mg Oral BID Sharion Settler, NP      . multivitamin with minerals tablet 1 tablet  1 tablet Oral Daily Algernon Huxley, MD   1 tablet at 09/14/19 0955  . neomycin-bacitracin-polymyxin (NEOSPORIN) ointment   Topical PRN Algernon Huxley, MD   Given at 09/11/19 1832  . nitroGLYCERIN (NITROSTAT) SL tablet 0.4 mg  0.4 mg Sublingual Q5 min PRN Stegmayer, Kimberly A, PA-C      . ondansetron (ZOFRAN) tablet 4 mg  4 mg Oral Q6H PRN Algernon Huxley, MD       Or  . ondansetron (ZOFRAN) injection 4 mg  4 mg Intravenous  Q6H PRN Algernon Huxley, MD      . pantoprazole (PROTONIX) EC tablet 40 mg  40 mg Oral Daily Lorella Nimrod, MD   40 mg at 09/14/19 1155  . potassium chloride SA (KLOR-CON) CR tablet 10 mEq  10 mEq Oral Daily Dew, Erskine Squibb, MD  10 mEq at 09/14/19 0956  . sodium chloride flush (NS) 0.9 % injection 3 mL  3 mL Intravenous Once Algernon Huxley, MD      . tamsulosin (FLOMAX) capsule 0.4 mg  0.4 mg Oral Daily Algernon Huxley, MD   0.4 mg at 09/14/19 0957  . torsemide (DEMADEX) tablet 20 mg  20 mg Oral Daily Algernon Huxley, MD   20 mg at 09/14/19 0958  . traMADol (ULTRAM) tablet 50 mg  50 mg Oral Q6H PRN Algernon Huxley, MD   50 mg at 09/14/19 0954  . vancomycin (VANCOREADY) IVPB 1250 mg/250 mL  1,250 mg Intravenous Q24H Lorella Nimrod, MD 166.7 mL/hr at 09/13/19 2312 1,250 mg at 09/13/19 2312     Discharge Medications: Please see discharge summary for a list of discharge medications.  Relevant Imaging Results:  Relevant Lab Results:   Additional Information SSN: [id number]  Deandra Goering, Gardiner Rhyme, LCSW

## 2019-09-14 NOTE — Progress Notes (Signed)
Cross Cover Brief Note Patient hypotensve and bradycardic after receiving night time BB dose.  Metoprolol scheduled doses decreased to 25 mg BID

## 2019-09-14 NOTE — Plan of Care (Signed)

## 2019-09-14 NOTE — Progress Notes (Signed)
Held metoprolol d/t patient Pulse 151

## 2019-09-15 DIAGNOSIS — E43 Unspecified severe protein-calorie malnutrition: Secondary | ICD-10-CM | POA: Insufficient documentation

## 2019-09-15 LAB — GLUCOSE, CAPILLARY
Glucose-Capillary: 110 mg/dL — ABNORMAL HIGH (ref 70–99)
Glucose-Capillary: 204 mg/dL — ABNORMAL HIGH (ref 70–99)
Glucose-Capillary: 142 mg/dL — ABNORMAL HIGH (ref 70–99)
Glucose-Capillary: 146 mg/dL — ABNORMAL HIGH (ref 70–99)

## 2019-09-15 LAB — BASIC METABOLIC PANEL
Anion gap: 7 (ref 5–15)
BUN: 36 mg/dL — ABNORMAL HIGH (ref 8–23)
CO2: 31 mmol/L (ref 22–32)
Calcium: 7.9 mg/dL — ABNORMAL LOW (ref 8.9–10.3)
Chloride: 103 mmol/L (ref 98–111)
Creatinine, Ser: 1.68 mg/dL — ABNORMAL HIGH (ref 0.61–1.24)
GFR calc Af Amer: 44 mL/min — ABNORMAL LOW (ref >60)
GFR calc non Af Amer: 38 mL/min — ABNORMAL LOW (ref >60)
Glucose, Bld: 123 mg/dL — ABNORMAL HIGH (ref 70–99)
Potassium: 3.6 mmol/L (ref 3.5–5.1)
Sodium: 141 mmol/L (ref 135–145)

## 2019-09-15 LAB — SARS CORONAVIRUS 2 (TAT 6-24 HRS): SARS Coronavirus 2: NEGATIVE

## 2019-09-15 MED ORDER — POLYETHYLENE GLYCOL 3350 17 G PO PACK
17.0000 g | PACK | Freq: Every day | ORAL | Status: DC
  Administered 2019-09-15 – 2019-09-16 (×2): 17 g via ORAL
  Filled 2019-09-15 (×2): qty 1

## 2019-09-15 MED ORDER — SODIUM CHLORIDE 0.9 % IV SOLN: INTRAVENOUS | Status: AC

## 2019-09-15 MED ORDER — AMOXICILLIN-POT CLAVULANATE 875-125 MG PO TABS
1.0000 | ORAL_TABLET | Freq: Two times a day (BID) | ORAL | Status: DC
  Administered 2019-09-15 – 2019-09-16 (×2): 1 via ORAL
  Filled 2019-09-15 (×2): qty 1

## 2019-09-15 MED ORDER — CIPROFLOXACIN HCL 500 MG PO TABS
500.0000 mg | ORAL_TABLET | Freq: Two times a day (BID) | ORAL | Status: DC
  Administered 2019-09-15 – 2019-09-16 (×2): 500 mg via ORAL
  Filled 2019-09-15 (×3): qty 1

## 2019-09-15 NOTE — Care Management Important Message (Signed)
Important Message  Patient Details  Name: Randy Howard MRN: [id number] Date of Birth: [birth year]   Medicare Important Message Given:        Darius Bump Cinzia Devos 09/15/2019, 11:36 AM

## 2019-09-15 NOTE — Progress Notes (Signed)
Nutrition Follow-up  DOCUMENTATION CODES:   Severe malnutrition in context of chronic illness  INTERVENTION:  Continue Ensure Enlive po BID, each supplement provides 350 kcal and 20 grams of protein.  Provide Magic cup BID with lunch and dinner, each supplement provides 290 kcal and 9 grams of protein.   NUTRITION DIAGNOSIS:   Severe Malnutrition related to chronic illness as evidenced by severe fat depletion, severe muscle depletion.  New diagnosis.  GOAL:   Patient will meet greater than or equal to 90% of their needs  Progressing.  MONITOR:   PO intake, Supplement acceptance, Labs, Weight trends, Skin, I & O's  REASON FOR ASSESSMENT:   Malnutrition Screening Tool    ASSESSMENT:   79 year old male with PMHx of HTN, PVD, OSA, neuropathy, CAD s/p CABG 2006, HLD, arthritis admitted with right foot osteomyelitis.  Met with patient at bedside. He reports his appetite was decreased when he first arrived to the hospital but has been improving. He does have some difficulty chewing due to a fall where he hurt his tooth. He reports he is able to find soft foods on the menu. Patient is now documented to be eating 80-90% of his meals. However, he only had about 50% of his lunch today (tray still in room) and he reports he is only eating about 50%-75% of his meals. He reports he is drinking one Ensure per day. Encouraged patient to try to drink both bottles daily. Patient reports his appetite has been decreased for a while PTA. He reports he has been losing weight for over a year now.   Medications reviewed and include: Augmentin, Cipro, Novolog 0-15 units TID with meals, Novolog 0-5 units QHS, Novolog 3 units TID with meals, Lantus 6 units QHS, MVI daily, Protonix, Miralax, potassium chloride 10 mEq daily, senna-docusate 2 tablets BID, NS at 50 mL/hr.  Labs reviewed: CBG 96-204, BUN 36, Creatinine 1.68.  NUTRITION - FOCUSED PHYSICAL EXAM:    Most Recent Value  Orbital Region Severe  depletion  Upper Arm Region Severe depletion  Thoracic and Lumbar Region Moderate depletion  Buccal Region Severe depletion  Temple Region Severe depletion  Clavicle Bone Region Severe depletion  Clavicle and Acromion Bone Region Severe depletion  Scapular Bone Region Severe depletion  Dorsal Hand Severe depletion  Patellar Region Severe depletion  Anterior Thigh Region Severe depletion  Posterior Calf Region Unable to assess  Edema (RD Assessment) --  [non-pitting]  Hair Reviewed  Eyes Reviewed  Mouth Reviewed  Skin Reviewed  [ecchymosis]  Nails Reviewed     Diet Order:   Diet Order            Diet Carb Modified Fluid consistency: Thin; Room service appropriate? Yes  Diet effective now                EDUCATION NEEDS:   No education needs have been identified at this time  Skin:  Skin Assessment: Skin Integrity Issues: Skin Integrity Issues:: Other (Comment) Other: Ulcer right great toe (1cm x 0.6cm); ulcer sole right foot (2.5cm x 1cm); open blister right leg (6.2cm x 6.5cm); ulcer sole left foot (1.7cm x 1.3cm)  Last BM:  09/10/2019  Height:   Ht Readings from Last 1 Encounters:  09/09/19 _0  (1.803 m)   Weight:   Wt Readings from Last 1 Encounters:  09/09/19 77.9 kg   BMI:  Body mass index is 23.95 kg/m.  Estimated Nutritional Needs:   Kcal:  2000-2200  Protein:  100-110 grams  Fluid:  1.9 L/day  Jacklynn Barnacle, MS, RD, LDN Pager number available on Amion

## 2019-09-15 NOTE — Progress Notes (Signed)
PROGRESS NOTE    IVAL PACER  ZOX:[id number] DOB: [birth year] DOA: 09/09/2019 PCP: Olin Hauser, DO   Brief Narrative:  [name] is a 79 y.o. male with medical history significant of coronary artery disease, hypertension, hyperlipidemia, peripheral neuropathy, peripheral vascular disease, BPH, history of skin cancer, history of stent placement, history of carotid artery disease who is being followed by vascular surgery to continue presenting to the ER today with bilateral feet ulcers.  Patient was known to have vascular insufficiency as well as venous stasis dermatitis.  It appears he has multiple venous ulcers bilaterally.  He has pain in his feet bilaterally. Imaging of both feet shows osteomyelitis on right foot with visible ulceration and cellulitis of left foot.  Podiatry was consulted and he was admitted with broad-spectrum antibiotics, vascular surgery did angiography which shows poor circulation to right foot.  Podiatry will now wait with p.o. antibiotics and follow-up as an outpatient in 2 weeks to decide about amputation.  Subjective: Patient has no new complaints today.  Denies any chest pain.  We discussed the result of his esophageal gram from yesterday.  Assessment & Plan:   Principal Problem:   Acute osteomyelitis of right foot (HCC) Active Problems:   Hypertension   Chronic venous insufficiency   Peripheral vascular disease (HCC)   CAD (coronary artery disease)   Anxiety disorder   CKD (chronic kidney disease), stage III   Cellulitis of both feet  right foot osteomyelitis: Involving the great toe.  Also related to patient's vascular ulceration and cellulitis.  Patient has a recent wound culture done at wound clinic which was positive for Enterobacter cloacae and Enterococcus faecalis.  He was placed on cefepime and vancomycin on admission which will cover both of those bacteria along with Pseudomonas. -MRI with right hallux and a possible third  metatarsal osteomyelitis along with left third metatarsal osteomyelitis. -Podiatry was consulted-patient had poor circulation in right foot after angiography .  Podiatry is considering continuation of p.o. antibiotics and outpatient follow-up before making further decision regarding amputation. -PT evaluation-SNF placement-TOC consulted. -Also need outpatient wound care. -Discontinue cefepime and vancomycin today due to increase in his creatinine.  And start him on Augmentin and Cipro according to the recommendations of his podiatrist.  Severe peripheral arterial disease.  Patient has very weak peripheral pulses.  Has an history of bilateral vascular ulcers and cellulitis. Vascular surgery was also consulted on admission-patient has an history of multiple procedures on right lower extremity.  Current angiography with poor circulation in the right foot.  They will do repeat angiography on left lower extremity in 1 to 2 weeks as an outpatient to prevent renal injury.  -Patient underwent right lower extremity angiogram with vascular surgery today which demonstrated a flush occlusion of the right SFA with a patulous common femoral artery from his previous endarterectomy.  The profunda femoris is widely patent and collateralizes well to the above-knee popliteal artery.  There is an occlusion in the mid popliteal artery with the below-knee popliteal artery reconstituting.  The peroneal artery was the dominant runoff to the foot with a somewhat diseased posterior tibial artery seen going distally as well although the degree of disease was difficult to discern. -Continue Plavix and statin.  Dysphagia.  We obtain barium swallow esophagogram due to his complaint of pills sticking to his chest.  It shows mild tertiary contractions with no obvious obstruction. -Crush pills if they are bigger in size. -PPI for GERD symptoms.  Chronic venous insufficiency. -Continue  home dose of torsemide.  CAD.  No acute  concern. -Continue to monitor and home meds.  AKI with CKD stage IIIa.  Creatinine increased to 1.68 today. -Discontinue vancomycin. -Give him gentle fluids. -Avoid nephrotoxins -continue to monitor  History of anxiety disorder. -Continue home meds.  Objective: Vitals:   09/14/19 1558 09/14/19 2259 09/14/19 2352 09/15/19 0736  BP: (!) 84/39 (!) 97/57 104/67 (!) 107/46  Pulse: (!) 57 (!) 58 (!) 59 (!) 59  Resp: 18  17 17   Temp: (!) 97.5 F (36.4 C)  97.8 F (36.6 C) (!) 97.5 F (36.4 C)  TempSrc: Oral  Oral Oral  SpO2: 96%  98% 99%  Weight:      Height:        Intake/Output Summary (Last 24 hours) at 09/15/2019 1545 Last data filed at 09/15/2019 1400 Gross per 24 hour  Intake 980 ml  Output 2480 ml  Net -1500 ml   Filed Weights   09/09/19 1410 09/09/19 2316  Weight: 68.5 kg 77.9 kg    Examination:  General exam: Chronically ill-appearing elderly man, appears calm and comfortable  Respiratory system: Clear to auscultation. Respiratory effort normal. Cardiovascular system: S1 & S2 heard, RRR. No JVD, murmurs, rubs, Gastrointestinal system: Soft, nontender, nondistended, bowel sounds positive. Central nervous system: Alert and oriented. No focal neurological deficits. Extremities: Bilateral 1+ lower extremity edema with signs of chronic venous stasis and congestion.  Bilateral scaling and a small area of raw skin where blister ruptured on right medial extremity.  Both feet with clean bandages. Psychiatry: Judgement and insight appear normal.   DVT prophylaxis: Lovenox Code Status: Full Family Communication: Discussed with patient Disposition Plan:  Status is: Inpatient  Remains inpatient appropriate because:Inpatient level of care appropriate due to severity of illness   Dispo: The patient is from: Home              Anticipated d/c is to: To be determined              Anticipated d/c date is: 1 day.              Patient currently is not medically stable to d/c.   Podiatry does not want to do amputation at this time, will follow up as an outpatient and decide about amputations later on after another vascular procedure in couple of weeks.  PT recommending SNF placement.  TOC is looking for bed and insurance authorization.  Consultants:   Podiatry  Vascular surgery  Procedures:  Antimicrobials:  Cipro Augmentin  Data Reviewed: I have personally reviewed following labs and imaging studies  CBC: Recent Labs  Lab 09/09/19 1412 09/10/19 0520  WBC 12.7* 14.1*  NEUTROABS 11.2*  --   HGB 14.2 15.1  HCT 42.1 43.6  MCV 88.6 87.7  PLT 196 428   Basic Metabolic Panel: Recent Labs  Lab 09/11/19 0507 09/12/19 0338 09/13/19 0413 09/14/19 0546 09/15/19 0549  NA 138 138 138 139 141  K 3.6 3.5 3.3* 3.5 3.6  CL 107 107 105 103 103  CO2 26 25 27 28 31   GLUCOSE 141* 210* 184* 158* 123*  BUN 44* 39* 33* 33* 36*  CREATININE 1.63* 1.36* 1.23 1.24 1.68*  CALCIUM 7.8* 7.6* 7.7* 7.8* 7.9*  MG  --   --  1.6*  --   --    GFR: Estimated Creatinine Clearance: 37.4 mL/min (A) (by C-G formula based on SCr of 1.68 mg/dL (H)). Liver Function Tests: Recent Labs  Lab 09/09/19 1412  09/10/19 0520  AST 188* 81*  ALT 97* 65*  ALKPHOS 89 63  BILITOT 2.0* 1.3*  PROT 6.5 4.6*  ALBUMIN 3.0* 2.2*   No results for input(s): LIPASE, AMYLASE in the last 168 hours. No results for input(s): AMMONIA in the last 168 hours. Coagulation Profile: No results for input(s): INR, PROTIME in the last 168 hours. Cardiac Enzymes: No results for input(s): CKTOTAL, CKMB, CKMBINDEX, TROPONINI in the last 168 hours. BNP (last 3 results) No results for input(s): PROBNP in the last 8760 hours. HbA1C: No results for input(s): HGBA1C in the last 72 hours. CBG: Recent Labs  Lab 09/14/19 1205 09/14/19 1559 09/14/19 2105 09/15/19 0737 09/15/19 1204  GLUCAP 96 124* 187* 110* 204*   Lipid Profile: No results for input(s): CHOL, HDL, LDLCALC, TRIG, CHOLHDL, LDLDIRECT in the  last 72 hours. Thyroid Function Tests: No results for input(s): TSH, T4TOTAL, FREET4, T3FREE, THYROIDAB in the last 72 hours. Anemia Panel: No results for input(s): VITAMINB12, FOLATE, FERRITIN, TIBC, IRON, RETICCTPCT in the last 72 hours. Sepsis Labs: Recent Labs  Lab 09/09/19 1412 09/09/19 2006  LATICACIDVEN 2.8* 2.5*    Recent Results (from the past 240 hour(s))  Blood culture (routine x 2)     Status: None   Collection Time: 09/09/19  8:06 PM   Specimen: BLOOD  Result Value Ref Range Status   Specimen Description BLOOD LEFT ANTECUBITAL  Final   Special Requests   Final    BOTTLES DRAWN AEROBIC AND ANAEROBIC Blood Culture results may not be optimal due to an inadequate volume of blood received in culture bottles   Culture   Final    NO GROWTH 5 DAYS Performed at [facility], [address] [place], [place] [address]    Report Status 09/14/2019 FINAL  Final  Blood culture (routine x 2)     Status: None   Collection Time: 09/09/19  8:06 PM   Specimen: BLOOD  Result Value Ref Range Status   Specimen Description BLOOD LEFT HAND  Final   Special Requests   Final    BOTTLES DRAWN AEROBIC AND ANAEROBIC Blood Culture adequate volume   Culture   Final    NO GROWTH 5 DAYS Performed at [facility], [address] [place], [place] [address]    Report Status 09/14/2019 FINAL  Final  SARS Coronavirus 2 by RT PCR (hospital order, performed in [facility] hospital lab) Nasopharyngeal Nasopharyngeal Swab     Status: None   Collection Time: 09/09/19 10:15 PM   Specimen: Nasopharyngeal Swab  Result Value Ref Range Status   SARS Coronavirus 2 NEGATIVE NEGATIVE Final    Comment: (NOTE) SARS-CoV-2 target nucleic acids are NOT DETECTED.  The SARS-CoV-2 RNA is generally detectable in upper and lower respiratory specimens during the acute phase of infection. The lowest concentration of SARS-CoV-2 viral copies this assay can detect is 250 copies / mL. A negative  result does not preclude SARS-CoV-2 infection and should not be used as the sole basis for treatment or other patient management decisions.  A negative result may occur with improper specimen collection / handling, submission of specimen other than nasopharyngeal swab, presence of viral mutation(s) within the areas targeted by this assay, and inadequate number of viral copies (<250 copies / mL). A negative result must be combined with clinical observations, patient history, and epidemiological information.  Fact Sheet for Patients:   StrictlyIdeas.no  Fact Sheet for Healthcare Providers: BankingDealers.co.za  This test is not yet approved or  cleared by the [place] and has been authorized for detection and/or diagnosis of SARS-CoV-2 by FDA under an Emergency Use Authorization (EUA).  This EUA will remain in effect (meaning this test can be used) for the duration of the COVID-19 declaration under Section 564(b)(1) of the Act, 21 U.S.C. section 360bbb-3(b)(1), unless the authorization is terminated or revoked sooner.  Performed at [facility], [address] [place], [place] [address]      Radiology Studies: DG ESOPHAGUS W SINGLE CM (SOL OR THIN BA)  Result Date: 09/14/2019 CLINICAL DATA:  Difficulty swallowing, particularly large pills EXAM: ESOPHOGRAM/BARIUM SWALLOW TECHNIQUE: Single contrast examination was performed using thin barium. Barium impregnated tablet was administered as well. FLUOROSCOPY TIME:  Fluoroscopy Time:  2 minutes 54 seconds Radiation Exposure Index (if provided by the fluoroscopic device): 83.1 mGy Number of Acquired Spot Images: 32, multiple cine fluoroscopic runs COMPARISON:  None. FINDINGS: Swallowing mechanism and esophageal transit time are within normal limits. Esophageal mucosa is within normal limits. No definitive reflux is seen. Minimal tertiary contractions are noted. No focal area of  narrowing is seen. Barium impregnated tablet was administered and held briefly in the mid esophagus although no true obstructive changes seen. This reproduced symptoms similar to what the patient is experiencing. IMPRESSION: Mild tertiary contractions are noted. Mild functional obstruction which may be related to the size and dryness of the tablet within the mid esophagus. This did reproduce the patient's symptomatology although no definitive obstructive changes are seen. Electronically Signed   By: Inez Catalina M.D.   On: 09/14/2019 11:44    Scheduled Meds: . amoxicillin-clavulanate  1 tablet Oral Q12H  . aspirin EC  81 mg Oral Daily  . atorvastatin  10 mg Oral Daily  . benazepril  40 mg Oral Daily  . ciprofloxacin  500 mg Oral BID  . clopidogrel  75 mg Oral Daily  . enoxaparin (LOVENOX) injection  40 mg Subcutaneous Q24H  . feeding supplement (ENSURE ENLIVE)  237 mL Oral BID BM  . finasteride  5 mg Oral Daily  . fluticasone  2 spray Each Nare Daily  . gabapentin  100-200 mg Oral QHS  . gabapentin  200 mg Oral Daily  . insulin aspart  0-15 Units Subcutaneous TID WC  . insulin aspart  0-5 Units Subcutaneous QHS  . insulin aspart  3 Units Subcutaneous TID WC  . insulin glargine  6 Units Subcutaneous QHS  . melatonin  5 mg Oral QHS  . metoprolol tartrate  25 mg Oral BID  . multivitamin with minerals  1 tablet Oral Daily  . pantoprazole  40 mg Oral Daily  . polyethylene glycol  17 g Oral Daily  . potassium chloride  10 mEq Oral Daily  . senna-docusate  2 tablet Oral BID  . sodium chloride flush  3 mL Intravenous Once  . tamsulosin  0.4 mg Oral Daily  . torsemide  20 mg Oral Daily   Continuous Infusions: . sodium chloride 50 mL/hr at 09/15/19 1316     LOS: 6 days   Time spent: 35 minutes.  Lorella Nimrod, MD [facility]  If 7PM-7AM, please contact night-coverage [email or web address]  09/15/2019, 3:45 PM   This record has been created using Systems analyst.  Errors have been sought and corrected,but may not always be located. Such creation errors do not reflect on the standard of care.

## 2019-09-15 NOTE — Care Management Important Message (Signed)
Important Message  Patient Details  Name: Randy Howard MRN: [id number] Date of Birth: 1941-01-23   Medicare Important Message Given:  Yes     Juliann Pulse A Fionn Stracke 09/15/2019, 11:38 AM

## 2019-09-15 NOTE — TOC Progression Note (Addendum)
Transition of Care [facility]) - Progression Note    Patient Details  Name: Randy Howard MRN: [id number] Date of Birth: December 22, 1940  Transition of Care [facility]) CM/SW Contact  Tommi Crepeau, Gardiner Rhyme, LCSW Phone Number: 09/15/2019, 9:58 AM  Clinical Narrative:   Met with pt to discuss bed offers, he has [facility] due to has been there before. Will begin insurance auth ref [id number]. Have let [facility] know  Have asked MD to order COVID test in preparation and pt has been vaccinated. Work on discharge plan. Clinical information faxed. Pt needs to have a BM he reports he has only had one in the last five days. Bedside RN aware and will work on this   Expected Discharge Plan: [place] Barriers to Discharge: Continued Medical Work up  Expected Discharge Plan and Services Expected Discharge Plan: [place] In-house Referral: Clinical Social Work   Post Acute Care Choice: Home Health, Durable Medical Equipment Living arrangements for the past 2 months: Single Family Home                                       Social Determinants of Health (SDOH) Interventions    Readmission Risk Interventions No flowsheet data found.

## 2019-09-16 DIAGNOSIS — L97512 Non-pressure chronic ulcer of other part of right foot with fat layer exposed: Secondary | ICD-10-CM | POA: Diagnosis not present

## 2019-09-16 DIAGNOSIS — Z7901 Long term (current) use of anticoagulants: Secondary | ICD-10-CM | POA: Diagnosis not present

## 2019-09-16 DIAGNOSIS — L97822 Non-pressure chronic ulcer of other part of left lower leg with fat layer exposed: Secondary | ICD-10-CM | POA: Diagnosis not present

## 2019-09-16 DIAGNOSIS — N183 Chronic kidney disease, stage 3 unspecified: Secondary | ICD-10-CM | POA: Diagnosis not present

## 2019-09-16 DIAGNOSIS — M62838 Other muscle spasm: Secondary | ICD-10-CM | POA: Diagnosis not present

## 2019-09-16 DIAGNOSIS — I25119 Atherosclerotic heart disease of native coronary artery with unspecified angina pectoris: Secondary | ICD-10-CM | POA: Diagnosis not present

## 2019-09-16 DIAGNOSIS — F419 Anxiety disorder, unspecified: Secondary | ICD-10-CM | POA: Diagnosis not present

## 2019-09-16 DIAGNOSIS — Z48812 Encounter for surgical aftercare following surgery on the circulatory system: Secondary | ICD-10-CM | POA: Diagnosis not present

## 2019-09-16 DIAGNOSIS — E1122 Type 2 diabetes mellitus with diabetic chronic kidney disease: Secondary | ICD-10-CM | POA: Diagnosis not present

## 2019-09-16 DIAGNOSIS — E1140 Type 2 diabetes mellitus with diabetic neuropathy, unspecified: Secondary | ICD-10-CM | POA: Diagnosis not present

## 2019-09-16 DIAGNOSIS — M6281 Muscle weakness (generalized): Secondary | ICD-10-CM | POA: Diagnosis not present

## 2019-09-16 DIAGNOSIS — L03115 Cellulitis of right lower limb: Secondary | ICD-10-CM

## 2019-09-16 DIAGNOSIS — L03116 Cellulitis of left lower limb: Secondary | ICD-10-CM

## 2019-09-16 DIAGNOSIS — I739 Peripheral vascular disease, unspecified: Secondary | ICD-10-CM | POA: Diagnosis not present

## 2019-09-16 DIAGNOSIS — L89893 Pressure ulcer of other site, stage 3: Secondary | ICD-10-CM | POA: Diagnosis not present

## 2019-09-16 DIAGNOSIS — E1165 Type 2 diabetes mellitus with hyperglycemia: Secondary | ICD-10-CM | POA: Diagnosis not present

## 2019-09-16 DIAGNOSIS — I25810 Atherosclerosis of coronary artery bypass graft(s) without angina pectoris: Secondary | ICD-10-CM | POA: Diagnosis not present

## 2019-09-16 DIAGNOSIS — I10 Essential (primary) hypertension: Secondary | ICD-10-CM

## 2019-09-16 DIAGNOSIS — L97812 Non-pressure chronic ulcer of other part of right lower leg with fat layer exposed: Secondary | ICD-10-CM | POA: Diagnosis not present

## 2019-09-16 DIAGNOSIS — I639 Cerebral infarction, unspecified: Secondary | ICD-10-CM | POA: Diagnosis not present

## 2019-09-16 DIAGNOSIS — E785 Hyperlipidemia, unspecified: Secondary | ICD-10-CM | POA: Diagnosis not present

## 2019-09-16 DIAGNOSIS — M86171 Other acute osteomyelitis, right ankle and foot: Secondary | ICD-10-CM | POA: Diagnosis not present

## 2019-09-16 DIAGNOSIS — R279 Unspecified lack of coordination: Secondary | ICD-10-CM | POA: Diagnosis not present

## 2019-09-16 DIAGNOSIS — M7989 Other specified soft tissue disorders: Secondary | ICD-10-CM | POA: Diagnosis not present

## 2019-09-16 DIAGNOSIS — E43 Unspecified severe protein-calorie malnutrition: Secondary | ICD-10-CM

## 2019-09-16 DIAGNOSIS — I7025 Atherosclerosis of native arteries of other extremities with ulceration: Secondary | ICD-10-CM | POA: Diagnosis not present

## 2019-09-16 DIAGNOSIS — J449 Chronic obstructive pulmonary disease, unspecified: Secondary | ICD-10-CM | POA: Diagnosis not present

## 2019-09-16 DIAGNOSIS — R5381 Other malaise: Secondary | ICD-10-CM | POA: Diagnosis not present

## 2019-09-16 DIAGNOSIS — L97522 Non-pressure chronic ulcer of other part of left foot with fat layer exposed: Secondary | ICD-10-CM | POA: Diagnosis not present

## 2019-09-16 DIAGNOSIS — N1831 Chronic kidney disease, stage 3a: Secondary | ICD-10-CM | POA: Diagnosis not present

## 2019-09-16 LAB — BASIC METABOLIC PANEL
Anion gap: 10 (ref 5–15)
BUN: 37 mg/dL — ABNORMAL HIGH (ref 8–23)
CO2: 28 mmol/L (ref 22–32)
Calcium: 7.8 mg/dL — ABNORMAL LOW (ref 8.9–10.3)
Chloride: 100 mmol/L (ref 98–111)
Creatinine, Ser: 1.58 mg/dL — ABNORMAL HIGH (ref 0.61–1.24)
GFR calc Af Amer: 47 mL/min — ABNORMAL LOW (ref >60)
GFR calc non Af Amer: 41 mL/min — ABNORMAL LOW (ref >60)
Glucose, Bld: 169 mg/dL — ABNORMAL HIGH (ref 70–99)
Potassium: 3.5 mmol/L (ref 3.5–5.1)
Sodium: 138 mmol/L (ref 135–145)

## 2019-09-16 LAB — GLUCOSE, CAPILLARY: Glucose-Capillary: 126 mg/dL — ABNORMAL HIGH (ref 70–99)

## 2019-09-16 MED ORDER — BACITRACIN-NEOMYCIN-POLYMYXIN OINTMENT TUBE: 1.0000 "application " | TOPICAL_OINTMENT | Freq: Two times a day (BID) | CUTANEOUS | 28 refills | Status: AC

## 2019-09-16 MED ORDER — AMOXICILLIN-POT CLAVULANATE 875-125 MG PO TABS: 1.0000 | ORAL_TABLET | Freq: Two times a day (BID) | ORAL | 0 refills | Status: AC

## 2019-09-16 MED ORDER — ADULT MULTIVITAMIN W/MINERALS CH: 1.0000 | ORAL_TABLET | Freq: Every day | ORAL | 0 refills | Status: AC

## 2019-09-16 MED ORDER — ASPIRIN 81 MG PO TBEC: 81.0000 mg | DELAYED_RELEASE_TABLET | Freq: Every day | ORAL | 11 refills | Status: AC

## 2019-09-16 MED ORDER — CIPROFLOXACIN HCL 500 MG PO TABS: 500.0000 mg | ORAL_TABLET | Freq: Two times a day (BID) | ORAL | 0 refills | Status: AC

## 2019-09-16 MED ORDER — POLYETHYLENE GLYCOL 3350 17 G PO PACK: 17.0000 g | PACK | Freq: Every day | ORAL | 0 refills | Status: AC

## 2019-09-16 NOTE — TOC Progression Note (Signed)
Transition of Care [facility]) - Progression Note    Patient Details  Name: Randy Howard MRN: [id number] Date of Birth: 04/02/1940  Transition of Care [facility]) CM/SW Contact  Suzannah Bettes, Gardiner Rhyme, LCSW Phone Number: 09/16/2019, 8:11 AM  Clinical Narrative: Pt has insurance auth for transfer to [facility] if medically stable to go. Have let bedside RN and MD aware. Will await response.  Ref [id number] 6/24-6/28.     Expected Discharge Plan: [place] Barriers to Discharge: Continued Medical Work up  Expected Discharge Plan and Services Expected Discharge Plan: [place] In-house Referral: Clinical Social Work   Post Acute Care Choice: Home Health, Durable Medical Equipment Living arrangements for the past 2 months: Single Family Home                                       Social Determinants of Health (SDOH) Interventions    Readmission Risk Interventions No flowsheet data found.

## 2019-09-16 NOTE — TOC Transition Note (Addendum)
Transition of Care [facility]) - CM/SW Discharge Note   Patient Details  Name: Randy Howard MRN: [id number] Date of Birth: 1940-08-24  Transition of Care [facility]) CM/SW Contact:  Elease Hashimoto, LCSW Phone Number: 09/16/2019, 9:20 AM   Clinical Narrative:  Pt has been approved to transfer today to [facility]. COVID test back and is negative and bedside RN aware of plans. She will call report to [phone]. Await DC paperwork and will transfer to facility. Ref number [id number] 6/24-6/28. Transport to be here at 11:30. Going to room 40A    Final next level of care: [place] Barriers to Discharge: Barriers Resolved   Patient Goals and CMS Choice Patient states their goals for this hospitalization and ongoing recovery are:: I plan to go home have alwyas been able to take care of myself why not now? CMS Medicare.gov Compare Post Acute Care list provided to:: Patient Choice offered to / list presented to : Patient  Discharge Placement PASRR number recieved: 09/14/19            Patient [facility] bed at: [facility] Patient to be transferred to facility by: [facility] Name of family member notified: [name]-sister Patient and family notified of of transfer: 09/16/19  Discharge Plan and Services In-house Referral: Clinical Social Work   Post Acute Care Choice: Home Health, Durable Medical Equipment                               Social Determinants of Health (SDOH) Interventions     Readmission Risk Interventions No flowsheet data found.

## 2019-09-16 NOTE — Plan of Care (Signed)
  Problem: Education: Goal: Knowledge of General Education information will improve Description: Including pain rating scale, medication(s)/side effects and non-pharmacologic comfort measures 09/16/2019 1036 by Ferrel Logan, RN Outcome: Adequate for Discharge 09/16/2019 0925 by Ferrel Logan, RN Outcome: Progressing   Problem: Clinical Measurements: Goal: Will remain free from infection 09/16/2019 1036 by Ferrel Logan, RN Outcome: Adequate for Discharge 09/16/2019 0925 by Ferrel Logan, RN Outcome: Progressing   Problem: Activity: Goal: Risk for activity intolerance will decrease 09/16/2019 1036 by Ferrel Logan, RN Outcome: Adequate for Discharge 09/16/2019 0925 by Ferrel Logan, RN Outcome: Progressing   Problem: Elimination: Goal: Will not experience complications related to bowel motility 09/16/2019 1036 by Ferrel Logan, RN Outcome: Adequate for Discharge 09/16/2019 0925 by Ferrel Logan, RN Outcome: Progressing Goal: Will not experience complications related to urinary retention 09/16/2019 1036 by Ferrel Logan, RN Outcome: Adequate for Discharge 09/16/2019 0925 by Ferrel Logan, RN Outcome: Adequate for Discharge   Problem: Pain Managment: Goal: General experience of comfort will improve 09/16/2019 1036 by Ferrel Logan, RN Outcome: Adequate for Discharge 09/16/2019 0925 by Ferrel Logan, RN Outcome: Progressing   Problem: Safety: Goal: Ability to remain free from injury will improve 09/16/2019 1036 by Ferrel Logan, RN Outcome: Adequate for Discharge 09/16/2019 0925 by Ferrel Logan, RN Outcome: Progressing   Problem: Skin Integrity: Goal: Risk for impaired skin integrity will decrease 09/16/2019 1036 by Ferrel Logan, RN Outcome: Adequate for Discharge 09/16/2019 0925 by Ferrel Logan, RN Outcome: Progressing

## 2019-09-16 NOTE — Discharge Summary (Addendum)
Physician Discharge Summary  Randy Howard:[id number] DOB: Mar 15, 1941 DOA: 09/09/2019  PCP: Randy Hauser, DO  Admit date: 09/09/2019 Discharge date: 09/16/2019  Admitted From: Home  Disposition:  SNF   Recommendations for Outpatient Follow-up and new medication changes:  1. Follow up with Dr. Parks Howard in 7 days.  2. Continue antibiotic therapy with Augmentin and ciprofloxacin for 10 more days. 3. Follow with vascular surgery next week.  4. Continue local wound care.  5. Added low dose aspirin 81 mg daily.  6. Follow up renal function in 7 days.   Home Health: na   Equipment/Devices: na    Discharge Condition: stable  CODE STATUS: full  Diet recommendation: heart healthy and diabetic prudent,   Brief/Interim Summary: Patient admitted to the hospital with right foot osteomyelitis in the setting of severe peripheral vascular disease.  79 year old male who presented with bilateral leg ulcers.  He does have significant past medical history for coronary artery disease, hypertension, dyslipidemia, peripheral neuropathy, peripheral vascular disease and BPH.  Patient with multiple venous ulcers bilaterally lower extremities.  Positive claudication symptoms.  He failed outpatient antibiotic therapy.  Lesions were more prevalent at the right foot.  On his initial physical examination his temperature was 98.2, blood pressure 139/94, heart rate 85, respiratory rate 18, oxygen saturation 100%.  He had dry mucous membranes, his lungs were clear to auscultation bilaterally, heart S1-S2, present rhythmic, his abdomen was soft, he had to plantar aspect venous ultrasound right foot, and on the left foot.  Each linear with a clean base.  No purulent discharge. Sodium 138, potassium 4.5, chloride 104, bicarb 22, glucose 204, BUN 35, creatinine 1.68, AST 188, ALT 97, lactic acid 2.8, white count 12.7, hemoglobin 14.2, hematocrit 42.1, platelets 196.  SARS COVID-19 was negative.  Urine analysis  specific gravity 1.026, 0-5 red cells, 0-5 white cells.  Patient underwent further work-up with feet MRI showing osteomyelitis, he was placed on broad-spectrum antibiotic therapy and vascular surgery was consulted.  He underwent angiography which showed poor circulation to the right foot.  Podiatry was consulted with recommendations for oral antibiotics and follow-up in 2 weeks, then decision will be made about amputation.  1.  Right foot osteomyelitis of the distal phalanx of the great toe/left foot osteomyelitis of the head of the third metatarsal and base of the proximal phalanx of the third toe/left tenosynovitis of the flexor tendon of the third toe, likely infectious. Patient was admitted to the medical ward, he was placed on broad spectrum antibiotic therapy with intravenous vancomycin and cefepime. Old cultures from the wound clinic were positive for Enterobacter cloacae and Enterobacter faecalis. Patient responded well to medical therapy, his antibiotics were transitioned to oral Augmentin and ciprofloxacin.  Patient very weak and deconditioned, he will be discharged to skilled nursing facility.  2.  Severe peripheral vascular disease. dyslipidemia.  Patient with symptomatic disease with claudication and skin ulcers.  Further work-up with ultrasonography of the lower extremities was negative for deep vein thrombosis.  Patient underwent aortogram, the aorta and iliac arteries were highly calcific and tortuous but nonstenotic.  Small abdominal aortic aneurysm. The right lower extremity demonstrated occlusion of the right SFA, profunda femoris was widely patent and collateralizes well to the above-knee popitleal artery.  Occlusion of the mid popliteal artery with the below-knee popliteal artery reconstituting.   Right lower extremity was unable to be revascularized during angiogram.  Patient was considered not a candidate for open bypass.  He was determined to be high  risk for tissue loss and  need for amputation.  Plan for left extremity angiogram with possible intervention next week.  Patient will continue clopidogrel and statin therapy, added aspirin.   3.  Dysphagia.  Patient underwent further work-up with swallow esophagogram which showed no obstruction. Continue aspiration precautions, and antiacid therapy with pantoprazole.  4.  Coronary artery disease/ HTN.  No active chest pain, continue clopidogrel.  Continue blood pressure control with metoprolol  5.  Acute kidney injury on chronic kidney disease stage IIIa/ hypokalemia.  His discharge creatinine is 1.58, sodium 138, potassium 3.5, chloride 100, bicarb 28.  Follow-up kidney function as an outpatient.  Patient received Kcl supplementation.   Continue home dose of torsemide.   6.  Uncontrolled type 2 diabetes mellitus/ Hgb A1C 7.7.  During hospitalization patient received sliding scale and basal insulin, at discharge resume glimepiride 4 mg daily.  7. BPH. Continue with finasteride and tamsulosin.  Discharge Diagnoses:  Principal Problem:   Acute osteomyelitis of right foot ([place]) Active Problems:   Hypertension   Chronic venous insufficiency   Peripheral vascular disease (HCC)   CAD (coronary artery disease)   Anxiety disorder   CKD (chronic kidney disease), stage III   Cellulitis of both feet   Protein-calorie malnutrition, severe    Discharge Instructions   Allergies as of 09/16/2019      Reactions   Oxycodone Nausea Only      Medication List    TAKE these medications   amoxicillin-clavulanate 875-125 MG tablet Commonly known as: AUGMENTIN Take 1 tablet by mouth every 12 (twelve) hours for 10 days.   aspirin 81 MG EC tablet Take 1 tablet (81 mg total) by mouth daily. Swallow whole. Start taking on: September 17, 2019   atorvastatin 10 MG tablet Commonly known as: LIPITOR Take 10 mg by mouth daily.   baclofen 10 MG tablet Commonly known as: LIORESAL TAKE 1 TABLET BY MOUTH 3 TIMES DAILY AS  NEEDED   benazepril 40 MG tablet Commonly known as: LOTENSIN TAKE 1 TABLET BY MOUTH ONCE DAILY.   ciprofloxacin 500 MG tablet Commonly known as: CIPRO Take 1 tablet (500 mg total) by mouth 2 (two) times daily for 10 days.   clopidogrel 75 MG tablet Commonly known as: PLAVIX TAKE 1 TABLET BY MOUTH ONCE DAILY.   feeding supplement (ENSURE ENLIVE) Liqd Take 237 mLs by mouth 2 (two) times daily between meals. Or equivalent Boosts.   finasteride 5 MG tablet Commonly known as: PROSCAR TAKE 1 TABLET BY MOUTH ONCE DAILY   fluticasone 50 MCG/ACT nasal spray Commonly known as: FLONASE Place 2 sprays into both nostrils daily. Use for 4-6 weeks then stop and use seasonally or as needed.   gabapentin 100 MG capsule Commonly known as: NEURONTIN Take 1-2 capsules (100-200 mg total) by mouth See admin instructions. Take 2 capsules (200mg ) by mouth every morning and take 1 to 2 capsules (100mg -200mg ) by mouth every night   glimepiride 4 MG tablet Commonly known as: AMARYL TAKE 1 TABLET BY MOUTH ONCE EVERY MORNING   hydrOXYzine 10 MG tablet Commonly known as: ATARAX/VISTARIL Take 1 tablet (10 mg total) by mouth 3 (three) times daily as needed for itching or anxiety.   metoprolol tartrate 50 MG tablet Commonly known as: LOPRESSOR Take 50 mg by mouth 2 (two) times daily.   multivitamin with minerals Tabs tablet Take 1 tablet by mouth daily. Start taking on: September 17, 2019   neomycin-bacitracin-polymyxin Oint Commonly known as: NEOSPORIN Apply 1 application topically  2 (two) times daily.   nitroGLYCERIN 0.4 MG SL tablet Commonly known as: NITROSTAT Place 1 tablet (0.4 mg total) under the tongue every 5 (five) minutes as needed for chest pain.   polyethylene glycol 17 g packet Commonly known as: MIRALAX / GLYCOLAX Take 17 g by mouth daily. Start taking on: September 17, 2019   tamsulosin 0.4 MG Caps capsule Commonly known as: FLOMAX Take 1 capsule (0.4 mg total) by mouth daily.    torsemide 20 MG tablet Commonly known as: DEMADEX Take 1 tablet (20 mg total) by mouth daily. (take 1 additional tablet (20mg ) by mouth daily as needed for excess weight)            Discharge Care Instructions  (From admission, onward)         Start     Ordered   09/16/19 0000  Discharge wound care:       Comments: Betadine dressings are ordered to the toe/foot wounds by Podiatry.  Unna's boots are requested to the bilateral LEs by Vascular   09/16/19 0953          Contact information for after-discharge care    [facility] .   Service: Skilled Nursing Contact information: [place] [phone]                 Allergies  Allergen Reactions  . Oxycodone Nausea Only    Consultations:  Vascular surgery   Surgery    Procedures/Studies: MR FOOT RIGHT WO CONTRAST  Result Date: 09/10/2019 CLINICAL DATA:  Chronic soft tissue ulcerations of the foot. EXAM: MRI OF THE RIGHT FOREFOOT WITHOUT CONTRAST TECHNIQUE: Multiplanar, multisequence MR imaging of the right forefoot was performed. No intravenous contrast was administered. COMPARISON:  Radiographs dated 09/01/2019 FINDINGS: Bones/Joint/Cartilage There is osteomyelitis of the distal phalanx of the great toe with focal destruction of a portion of the tuft of the distal phalanx. No other significant bone abnormality. Muscles and Tendons No significant abnormalities. Soft tissues Nonspecific slight edema on the dorsum of the foot as well as slight edema in the subcutaneous fat of the plantar aspect of the foot. No discrete soft tissue abscesses. 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 Electronically Signed   By: Lorriane Shire M.D.   On: 09/10/2019 14:20   MR FOOT LEFT WO CONTRAST  Result Date: 09/10/2019 CLINICAL DATA:  Soft tissue ulcerations of the left foot. EXAM: MRI OF THE LEFT FOREFOOT WITHOUT CONTRAST  TECHNIQUE: Multiplanar, multisequence MR imaging of the left midfoot and forefoot was performed. No intravenous contrast was administered. COMPARISON:  Radiographs dated 09/01/2019 FINDINGS: Bones/Joint/Cartilage There is abnormal edema in the head of the third metatarsal and the base of the proximal phalanx of the third toe. No discrete bone destruction. There is a deep soft tissue ulceration underlying the head of the third metatarsal. The bones of the midfoot and forefoot are otherwise normal. Muscles and Tendons There is a focal fluid collection surrounding the extensor tendon to the third toe at the level of the distal shaft of the metatarsal, best seen on image 28 of series 4, likely representing an abscess. The fluid collection extends toward the soft tissue ulceration Soft tissues Nonspecific subcutaneous edema in the forefoot. No discrete soft tissue abscesses. IMPRESSION: Osteomyelitis of the head of the third metatarsal and the base of the proximal phalanx of the third toe. Tenosynovitis of the flexor tendon of the third toe  as described above. This is likely infectious. Electronically Signed   By: Lorriane Shire M.D.   On: 09/10/2019 14:26   PERIPHERAL VASCULAR CATHETERIZATION  Result Date: 09/12/2019 See op note  US Venous Img Lower Bilateral  Result Date: 09/09/2019 CLINICAL DATA:  Bilateral lower extremity swelling. EXAM: BILATERAL LOWER EXTREMITY VENOUS DOPPLER ULTRASOUND TECHNIQUE: Gray-scale sonography with compression, as well as color and duplex ultrasound, were performed to evaluate the deep venous system(s) from the level of the common femoral vein through the popliteal and proximal calf veins. COMPARISON:  None. FINDINGS: VENOUS Normal compressibility of the common femoral, superficial femoral, and popliteal veins, as well as the visualized calf veins. Visualized portions of profunda femoral vein and great saphenous vein unremarkable. No filling defects to suggest DVT on grayscale or  color Doppler imaging. Doppler waveforms show normal direction of venous flow, normal respiratory plasticity and response to augmentation. Limited views of the contralateral common femoral vein are unremarkable. OTHER Bilateral lower extremity edema is noted. Limitations: none IMPRESSION: 1. No evidence of DVT within the right lower extremity or left lower extremity. 2. Mild diffuse bilateral lower extremity edema. Electronically Signed   By: Virgina Norfolk M.D.   On: 09/09/2019 20:25   DG Foot Complete Left  Result Date: 09/02/2019 CLINICAL DATA:  Diabetic foot ulcer with osteomyelitis. EXAM: LEFT FOOT - COMPLETE 3+ VIEW COMPARISON:  None. FINDINGS: Diffuse soft tissue swelling, most pronounced dorsally and distally. Small superficial soft tissue defects lateral to the 5th MTP joint. No soft tissue gas, bone destruction or periosteal reaction. Moderate inferior calcaneal spur formation IMPRESSION: Soft tissue ulceration lateral to the 5th MTP joint without radiographic evidence of underlying osteomyelitis. Electronically Signed   By: Claudie Revering M.D.   On: 09/02/2019 15:06   DG Foot Complete Right  Result Date: 09/02/2019 CLINICAL DATA:  Diabetic foot ulcer with osteomyelitis. EXAM: RIGHT FOOT COMPLETE - 3+ VIEW COMPARISON:  None. FINDINGS: Diffuse soft tissue swelling, most pronounced distally. Small soft tissue defect in the distal aspect of the great toe. Ill-defined bone destruction and fragmentation involving the 1st distal tuft. No soft tissue gas or fracture. Minimal 1st MTP joint degenerative changes. Mild inferior and mild moderate posterior calcaneal spur formation. IMPRESSION: 1. Osteomyelitis involving the 1st distal tuft. These results will be called to the ordering clinician or representative by the Radiologist Assistant, and communication documented in the PACS or [employer]. 2. Diffuse soft tissue swelling with a small ulceration in the distal aspect of the great toe.  Electronically Signed   By: Claudie Revering M.D.   On: 09/02/2019 15:09   DG ESOPHAGUS W SINGLE CM (SOL OR THIN BA)  Result Date: 09/14/2019 CLINICAL DATA:  Difficulty swallowing, particularly large pills EXAM: ESOPHOGRAM/BARIUM SWALLOW TECHNIQUE: Single contrast examination was performed using thin barium. Barium impregnated tablet was administered as well. FLUOROSCOPY TIME:  Fluoroscopy Time:  2 minutes 54 seconds Radiation Exposure Index (if provided by the fluoroscopic device): 83.1 mGy Number of Acquired Spot Images: 32, multiple cine fluoroscopic runs COMPARISON:  None. FINDINGS: Swallowing mechanism and esophageal transit time are within normal limits. Esophageal mucosa is within normal limits. No definitive reflux is seen. Minimal tertiary contractions are noted. No focal area of narrowing is seen. Barium impregnated tablet was administered and held briefly in the mid esophagus although no true obstructive changes seen. This reproduced symptoms similar to what the patient is experiencing. IMPRESSION: Mild tertiary contractions are noted. Mild functional obstruction which may be related to the size and  dryness of the tablet within the mid esophagus. This did reproduce the patient's symptomatology although no definitive obstructive changes are seen. Electronically Signed   By: Inez Catalina M.D.   On: 09/14/2019 11:44      Procedures:  1. Ultrasound guidance for vascular access left femoral artery 2. Catheter placement into right common femoral artery from left femoral approach 3. Aortogram and selective right lower extremity angiogram 4. StarClose closure device left femoral artery   Subjective: Patient is feeling better, no chest pain or dyspnea, no nausea or vomiting, continue to be very weak and deconditioned.   Discharge Exam: Vitals:   09/16/19 0011 09/16/19 0744  BP: 111/60 115/67  Pulse: 64 61  Resp: 19 15  Temp: 97.9 F (36.6 C) 97.7 F (36.5 C)  SpO2: 98% 99%   Vitals:    09/15/19 1607 09/16/19 0010 09/16/19 0011 09/16/19 0744  BP: 103/72  111/60 115/67  Pulse: (!) 48 63 64 61  Resp: 16 19 19 15   Temp: 97.8 F (36.6 C) 97.8 F (36.6 C) 97.9 F (36.6 C) 97.7 F (36.5 C)  TempSrc: Oral Oral Oral Oral  SpO2: 100% 98% 98% 99%  Weight:      Height:        General: Not in pain or dyspnea,  Neurology: Awake and alert, non focal  E ENT: no pallor, no icterus, oral mucosa moist Cardiovascular: No JVD. S1-S2 present, rhythmic, no gallops, rubs, or murmurs. No lower extremity edema. Pulmonary: positive breath sounds bilaterally, adequate air movement, no wheezing, rhonchi or rales. Gastrointestinal. Abdomen with no organomegaly, non tender, no rebound or guarding Skin. No rashes Musculoskeletal: wounds with dressing in place.   The results of significant diagnostics from this hospitalization (including imaging, microbiology, ancillary and laboratory) are listed below for reference.     Microbiology: Recent Results (from the past 240 hour(s))  Blood culture (routine x 2)     Status: None   Collection Time: 09/09/19  8:06 PM   Specimen: BLOOD  Result Value Ref Range Status   Specimen Description BLOOD LEFT ANTECUBITAL  Final   Special Requests   Final    BOTTLES DRAWN AEROBIC AND ANAEROBIC Blood Culture results may not be optimal due to an inadequate volume of blood received in culture bottles   Culture   Final    NO GROWTH 5 DAYS Performed at [facility], [address], [place], [place] [address]    Report Status 09/14/2019 FINAL  Final  Blood culture (routine x 2)     Status: None   Collection Time: 09/09/19  8:06 PM   Specimen: BLOOD  Result Value Ref Range Status   Specimen Description BLOOD LEFT HAND  Final   Special Requests   Final    BOTTLES DRAWN AEROBIC AND ANAEROBIC Blood Culture adequate volume   Culture   Final    NO GROWTH 5 DAYS Performed at [facility], [address] [place], [place] [address]     Report Status 09/14/2019 FINAL  Final  SARS Coronavirus 2 by RT PCR (hospital order, performed in [facility] hospital lab) Nasopharyngeal Nasopharyngeal Swab     Status: None   Collection Time: 09/09/19 10:15 PM   Specimen: Nasopharyngeal Swab  Result Value Ref Range Status   SARS Coronavirus 2 NEGATIVE NEGATIVE Final    Comment: (NOTE) SARS-CoV-2 target nucleic acids are NOT DETECTED.  The SARS-CoV-2 RNA is generally detectable in upper and lower respiratory specimens during the acute phase of infection.  The lowest concentration of SARS-CoV-2 viral copies this assay can detect is 250 copies / mL. A negative result does not preclude SARS-CoV-2 infection and should not be used as the sole basis for treatment or other patient management decisions.  A negative result may occur with improper specimen collection / handling, submission of specimen other than nasopharyngeal swab, presence of viral mutation(s) within the areas targeted by this assay, and inadequate number of viral copies (<250 copies / mL). A negative result must be combined with clinical observations, patient history, and epidemiological information.  Fact Sheet for Patients:   StrictlyIdeas.no  Fact Sheet for Healthcare Providers: BankingDealers.co.za  This test is not yet approved or  cleared by the [place] FDA and has been authorized for detection and/or diagnosis of SARS-CoV-2 by FDA under an Emergency Use Authorization (EUA).  This EUA will remain in effect (meaning this test can be used) for the duration of the COVID-19 declaration under Section 564(b)(1) of the Act, 21 U.S.C. section 360bbb-3(b)(1), unless the authorization is terminated or revoked sooner.  Performed at [facility], [place], [place] [address]   SARS CORONAVIRUS 2 (TAT 6-24 HRS) Nasopharyngeal Nasopharyngeal Swab     Status: None   Collection Time: 09/15/19 12:24 PM    Specimen: Nasopharyngeal Swab  Result Value Ref Range Status   SARS Coronavirus 2 NEGATIVE NEGATIVE Final    Comment: (NOTE) SARS-CoV-2 target nucleic acids are NOT DETECTED.  The SARS-CoV-2 RNA is generally detectable in upper and lower respiratory specimens during the acute phase of infection. Negative results do not preclude SARS-CoV-2 infection, do not rule out co-infections with other pathogens, and should not be used as the sole basis for treatment or other patient management decisions. Negative results must be combined with clinical observations, patient history, and epidemiological information. The expected result is Negative.  Fact Sheet for Patients: SugarRoll.be  Fact Sheet for Healthcare Providers: https://www.woods-mathews.com/  This test is not yet approved or cleared by the [place] FDA and  has been authorized for detection and/or diagnosis of SARS-CoV-2 by FDA under an Emergency Use Authorization (EUA). This EUA will remain  in effect (meaning this test can be used) for the duration of the COVID-19 declaration under Se ction 564(b)(1) of the Act, 21 U.S.C. section 360bbb-3(b)(1), unless the authorization is terminated or revoked sooner.  Performed at [facility], [place] [address] [place], [place] [address]      Labs: BNP (last 3 results) Recent Labs    07/21/19 1315  BNP 1,749.4*   Basic Metabolic Panel: Recent Labs  Lab 09/12/19 0338 09/13/19 0413 09/14/19 0546 09/15/19 0549 09/16/19 0504  NA 138 138 139 141 138  K 3.5 3.3* 3.5 3.6 3.5  CL 107 105 103 103 100  CO2 25 27 28 31 28   GLUCOSE 210* 184* 158* 123* 169*  BUN 39* 33* 33* 36* 37*  CREATININE 1.36* 1.23 1.24 1.68* 1.58*  CALCIUM 7.6* 7.7* 7.8* 7.9* 7.8*  MG  --  1.6*  --   --   --    Liver Function Tests: Recent Labs  Lab 09/09/19 1412 09/10/19 0520  AST 188* 81*  ALT 97* 65*  ALKPHOS 89 63  BILITOT 2.0* 1.3*  PROT 6.5  4.6*  ALBUMIN 3.0* 2.2*   No results for input(s): LIPASE, AMYLASE in the last 168 hours. No results for input(s): AMMONIA in the last 168 hours. CBC: Recent Labs  Lab 09/09/19 1412 09/10/19 0520  WBC 12.7* 14.1*  NEUTROABS 11.2*  --   HGB 14.2 15.1  HCT 42.1 43.6  MCV 88.6 87.7  PLT 196 169   Cardiac Enzymes: No results for input(s): CKTOTAL, CKMB, CKMBINDEX, TROPONINI in the last 168 hours. BNP: Invalid input(s): POCBNP CBG: Recent Labs  Lab 09/15/19 0737 09/15/19 1204 09/15/19 1610 09/15/19 2058 09/16/19 0750  GLUCAP 110* 204* 142* 146* 126*   D-Dimer No results for input(s): DDIMER in the last 72 hours. Hgb A1c No results for input(s): HGBA1C in the last 72 hours. Lipid Profile No results for input(s): CHOL, HDL, LDLCALC, TRIG, CHOLHDL, LDLDIRECT in the last 72 hours. Thyroid function studies No results for input(s): TSH, T4TOTAL, T3FREE, THYROIDAB in the last 72 hours.  Invalid input(s): FREET3 Anemia work up No results for input(s): VITAMINB12, FOLATE, FERRITIN, TIBC, IRON, RETICCTPCT in the last 72 hours. Urinalysis    Component Value Date/Time   COLORURINE AMBER (A) 09/09/2019 1458   APPEARANCEUR HAZY (A) 09/09/2019 1458   LABSPEC 1.026 09/09/2019 1458   PHURINE 5.0 09/09/2019 1458   GLUCOSEU NEGATIVE 09/09/2019 1458   HGBUR NEGATIVE 09/09/2019 1458   BILIRUBINUR NEGATIVE 09/09/2019 1458   KETONESUR NEGATIVE 09/09/2019 1458   PROTEINUR 100 (A) 09/09/2019 1458   NITRITE NEGATIVE 09/09/2019 1458   LEUKOCYTESUR NEGATIVE 09/09/2019 1458   Sepsis Labs Invalid input(s): PROCALCITONIN,  WBC,  LACTICIDVEN Microbiology Recent Results (from the past 240 hour(s))  Blood culture (routine x 2)     Status: None   Collection Time: 09/09/19  8:06 PM   Specimen: BLOOD  Result Value Ref Range Status   Specimen Description BLOOD LEFT ANTECUBITAL  Final   Special Requests   Final    BOTTLES DRAWN AEROBIC AND ANAEROBIC Blood Culture results may not be optimal  due to an inadequate volume of blood received in culture bottles   Culture   Final    NO GROWTH 5 DAYS Performed at [facility], [address], [place], [place] [address]    Report Status 09/14/2019 FINAL  Final  Blood culture (routine x 2)     Status: None   Collection Time: 09/09/19  8:06 PM   Specimen: BLOOD  Result Value Ref Range Status   Specimen Description BLOOD LEFT HAND  Final   Special Requests   Final    BOTTLES DRAWN AEROBIC AND ANAEROBIC Blood Culture adequate volume   Culture   Final    NO GROWTH 5 DAYS Performed at [facility], [address] [place], [place] [address]    Report Status 09/14/2019 FINAL  Final  SARS Coronavirus 2 by RT PCR (hospital order, performed in [facility] hospital lab) Nasopharyngeal Nasopharyngeal Swab     Status: None   Collection Time: 09/09/19 10:15 PM   Specimen: Nasopharyngeal Swab  Result Value Ref Range Status   SARS Coronavirus 2 NEGATIVE NEGATIVE Final    Comment: (NOTE) SARS-CoV-2 target nucleic acids are NOT DETECTED.  The SARS-CoV-2 RNA is generally detectable in upper and lower respiratory specimens during the acute phase of infection. The lowest concentration of SARS-CoV-2 viral copies this assay can detect is 250 copies / mL. A negative result does not preclude SARS-CoV-2 infection and should not be used as the sole basis for treatment or other patient management decisions.  A negative result may occur with improper specimen collection / handling, submission of specimen other than nasopharyngeal swab, presence of viral mutation(s) within the areas targeted by this assay, and inadequate number of viral copies (<250 copies / mL). A  negative result must be combined with clinical observations, patient history, and epidemiological information.  Fact Sheet for Patients:   StrictlyIdeas.no  Fact Sheet for Healthcare Providers: BankingDealers.co.za  This  test is not yet approved or  cleared by the [place] FDA and has been authorized for detection and/or diagnosis of SARS-CoV-2 by FDA under an Emergency Use Authorization (EUA).  This EUA will remain in effect (meaning this test can be used) for the duration of the COVID-19 declaration under Section 564(b)(1) of the Act, 21 U.S.C. section 360bbb-3(b)(1), unless the authorization is terminated or revoked sooner.  Performed at [facility], [place], [place] [address]   SARS CORONAVIRUS 2 (TAT 6-24 HRS) Nasopharyngeal Nasopharyngeal Swab     Status: None   Collection Time: 09/15/19 12:24 PM   Specimen: Nasopharyngeal Swab  Result Value Ref Range Status   SARS Coronavirus 2 NEGATIVE NEGATIVE Final    Comment: (NOTE) SARS-CoV-2 target nucleic acids are NOT DETECTED.  The SARS-CoV-2 RNA is generally detectable in upper and lower respiratory specimens during the acute phase of infection. Negative results do not preclude SARS-CoV-2 infection, do not rule out co-infections with other pathogens, and should not be used as the sole basis for treatment or other patient management decisions. Negative results must be combined with clinical observations, patient history, and epidemiological information. The expected result is Negative.  Fact Sheet for Patients: SugarRoll.be  Fact Sheet for Healthcare Providers: https://www.woods-mathews.com/  This test is not yet approved or cleared by the [place] FDA and  has been authorized for detection and/or diagnosis of SARS-CoV-2 by FDA under an Emergency Use Authorization (EUA). This EUA will remain  in effect (meaning this test can be used) for the duration of the COVID-19 declaration under Se ction 564(b)(1) of the Act, 21 U.S.C. section 360bbb-3(b)(1), unless the authorization is terminated or revoked sooner.  Performed at [facility], [place] [address],  [place],  [address]      Time coordinating discharge: 45 minutes  SIGNED:   Tawni Millers, MD  [facility] 09/16/2019, 9:17 AM

## 2019-09-16 NOTE — Plan of Care (Signed)
  Problem: Education: Goal: Knowledge of General Education information will improve Description: Including pain rating scale, medication(s)/side effects and non-pharmacologic comfort measures Outcome: Progressing   Problem: Clinical Measurements: Goal: Will remain free from infection Outcome: Progressing   Problem: Activity: Goal: Risk for activity intolerance will decrease Outcome: Progressing   Problem: Elimination: Goal: Will not experience complications related to bowel motility Outcome: Progressing   Problem: Pain Managment: Goal: General experience of comfort will improve Outcome: Progressing   Problem: Safety: Goal: Ability to remain free from injury will improve Outcome: Progressing   Problem: Skin Integrity: Goal: Risk for impaired skin integrity will decrease Outcome: Progressing

## 2019-09-19 DIAGNOSIS — J449 Chronic obstructive pulmonary disease, unspecified: Secondary | ICD-10-CM | POA: Diagnosis not present

## 2019-09-19 DIAGNOSIS — R5381 Other malaise: Secondary | ICD-10-CM | POA: Diagnosis not present

## 2019-09-19 DIAGNOSIS — M86171 Other acute osteomyelitis, right ankle and foot: Secondary | ICD-10-CM | POA: Diagnosis not present

## 2019-09-19 DIAGNOSIS — I739 Peripheral vascular disease, unspecified: Secondary | ICD-10-CM | POA: Diagnosis not present

## 2019-09-20 ENCOUNTER — Encounter (INDEPENDENT_AMBULATORY_CARE_PROVIDER_SITE_OTHER): Payer: Self-pay | Admitting: Vascular Surgery

## 2019-09-20 ENCOUNTER — Ambulatory Visit (INDEPENDENT_AMBULATORY_CARE_PROVIDER_SITE_OTHER): Payer: Medicare Other | Admitting: Vascular Surgery

## 2019-09-20 ENCOUNTER — Other Ambulatory Visit: Payer: Self-pay

## 2019-09-20 VITALS — BP 70/56 | HR 59 | Resp 14

## 2019-09-20 DIAGNOSIS — E43 Unspecified severe protein-calorie malnutrition: Secondary | ICD-10-CM

## 2019-09-20 DIAGNOSIS — I10 Essential (primary) hypertension: Secondary | ICD-10-CM

## 2019-09-20 DIAGNOSIS — E1122 Type 2 diabetes mellitus with diabetic chronic kidney disease: Secondary | ICD-10-CM | POA: Diagnosis not present

## 2019-09-20 DIAGNOSIS — M7989 Other specified soft tissue disorders: Secondary | ICD-10-CM | POA: Diagnosis not present

## 2019-09-20 DIAGNOSIS — I7025 Atherosclerosis of native arteries of other extremities with ulceration: Secondary | ICD-10-CM

## 2019-09-20 DIAGNOSIS — N183 Chronic kidney disease, stage 3 unspecified: Secondary | ICD-10-CM

## 2019-09-20 NOTE — Assessment & Plan Note (Signed)
Pushing p.o. intake would help his lower extremity swelling.  He is having a difficult time eating secondary to a dental injury from a fall last month.

## 2019-09-20 NOTE — Assessment & Plan Note (Signed)
Had an angiogram with contrast last week.  Angiogram on the left leg can be considered, but we will need to limit contrast if we do this.

## 2019-09-20 NOTE — Progress Notes (Signed)
MRN : [id number]  Randy Howard is a 79 y.o. (1940/10/01) male who presents with chief complaint of  Chief Complaint  Patient presents with   Follow-up    [facility] 1wk post angio  .  History of Present Illness: Patient returns today in follow up of his many issues.  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  On the left, his flow was only mildly reduced on noninvasive studies earlier this year and he should have adequate flow for wound healing with digit pressures in the 80s.  He did have an angiogram of the right leg last week and had no periprocedural complications. One of his bigger issues here recently has been massive lateral lower extremity swelling with weeping and ulceration.  This has been a little worse on the right than the left.  With Unna boots and elevating his legs, the swelling is under much better control.  He does still have a superficial ulceration on the medial right calf, but otherwise is doing well.  He is now in a skilled nursing facility and his daughter is trying to get him transition back closer to their home in [place] He has followed with podiatry who has recommended conservative therapy which I think is reasonable.  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  Current Outpatient Medications  Medication Sig Dispense Refill   amoxicillin-clavulanate (AUGMENTIN) 875-125 MG tablet Take 1 tablet by mouth every 12 (twelve) hours for 10 days. 20 tablet 0   aspirin EC 81 MG EC tablet Take 1 tablet (81 mg total) by mouth daily. Swallow whole. 30 tablet 11   atorvastatin (LIPITOR) 10 MG tablet Take 10 mg by mouth daily.     baclofen (LIORESAL) 10 MG tablet TAKE 1 TABLET BY  MOUTH 3 TIMES DAILY AS NEEDED 90 each 2   benazepril (LOTENSIN) 40 MG tablet TAKE 1 TABLET BY MOUTH ONCE DAILY. 90 tablet 0   ciprofloxacin (CIPRO) 500 MG tablet Take 1 tablet (500 mg total) by mouth 2 (two) times daily for 10 days. 20 tablet 0   clopidogrel (PLAVIX) 75 MG tablet TAKE 1 TABLET BY MOUTH ONCE DAILY. (Patient taking differently: Take 75 mg by mouth daily. ) 90 tablet 3   feeding supplement, ENSURE ENLIVE, (ENSURE ENLIVE) LIQD Take 237 mLs by mouth 2 (two) times daily between meals. Or equivalent Boosts. 237 mL 12   finasteride (PROSCAR) 5 MG tablet TAKE 1 TABLET BY MOUTH ONCE DAILY 90 tablet 0   fluticasone (FLONASE) 50 MCG/ACT nasal spray Place 2 sprays into both nostrils daily. Use for 4-6 weeks then stop and use seasonally or as needed. 16 g 3   gabapentin (NEURONTIN) 100 MG capsule Take 1-2 capsules (100-200 mg total) by mouth See admin instructions. Take 2 capsules (200mg ) by mouth every morning and take 1 to 2 capsules (100mg -200mg ) by mouth every night     glimepiride (AMARYL) 4 MG tablet TAKE 1 TABLET BY MOUTH ONCE EVERY MORNING 90 tablet 0   hydrOXYzine (ATARAX/VISTARIL) 10 MG tablet Take 1 tablet (10 mg total) by mouth 3 (three) times daily as needed for itching or anxiety.     metoprolol tartrate (LOPRESSOR) 50 MG tablet Take 50 mg by mouth 2 (  two) times daily.     Multiple Vitamin (MULTIVITAMIN WITH MINERALS) TABS tablet Take 1 tablet by mouth daily. 30 tablet 0   neomycin-bacitracin-polymyxin (NEOSPORIN) OINT Apply 1 application topically 2 (two) times daily. 1 Tube 28   nitroGLYCERIN (NITROSTAT) 0.4 MG SL tablet Place 1 tablet (0.4 mg total) under the tongue every 5 (five) minutes as needed for chest pain. 50 tablet 3   polyethylene glycol (MIRALAX / GLYCOLAX) 17 g packet Take 17 g by mouth daily. 14 each 0   tamsulosin (FLOMAX) 0.4 MG CAPS capsule Take 1 capsule (0.4 mg total) by mouth daily.     torsemide (DEMADEX) 20 MG tablet Take 1 tablet (20 mg total)  by mouth daily. (take 1 additional tablet (20mg ) by mouth daily as needed for excess weight) 30 tablet 0   No current facility-administered medications for this visit.    Past Medical History:  Diagnosis Date   Arthritis    Coronary artery disease    Dyspnea    Elevated lipids    Hyperlipidemia    Hypertension    Neuropathy    Nocturia    Obstructive sleep apnea    Peripheral vascular disease (HCC)    Prostate enlargement    PVD (peripheral vascular disease) (HCC)    Right carotid bruit    Skin cancer    Followed by Dr. Evorn Gong   Skin cancer    Tremor     Past Surgical History:  Procedure Laterality Date   CARDIAC CATHETERIZATION     CAROTID ARTERY ANGIOPLASTY Left 09/30/2011   CORONARY ARTERY BYPASS GRAFT  2006   ENDARTERECTOMY FEMORAL Bilateral 11/01/2014   Procedure: ENDARTERECTOMY FEMORAL;  Surgeon: Algernon Huxley, MD;  Location: [facility];  Service: Vascular;  Laterality: Bilateral;   LOWER EXTREMITY ANGIOGRAPHY Left 04/26/2018   Procedure: LOWER EXTREMITY ANGIOGRAPHY;  Surgeon: Algernon Huxley, MD;  Location: [place] [facility];  Service: Cardiovascular;  Laterality: Left;   LOWER EXTREMITY ANGIOGRAPHY Right 09/23/2018   Procedure: LOWER EXTREMITY ANGIOGRAPHY;  Surgeon: Algernon Huxley, MD;  Location: [place] [facility];  Service: Cardiovascular;  Laterality: Right;   LOWER EXTREMITY ANGIOGRAPHY Right 11/12/2018   Procedure: LOWER EXTREMITY ANGIOGRAPHY;  Surgeon: Algernon Huxley, MD;  Location: [place] [facility];  Service: Cardiovascular;  Laterality: Right;   LOWER EXTREMITY ANGIOGRAPHY Right 09/12/2019   Procedure: Lower Extremity Angiography;  Surgeon: Algernon Huxley, MD;  Location: [place] [facility];  Service: Cardiovascular;  Laterality: Right;   PERIPHERAL VASCULAR CATHETERIZATION Left 10/09/2014   Procedure: Lower Extremity Angiography;  Surgeon: Algernon Huxley, MD;  Location: [place] [facility];  Service: Cardiovascular;  Laterality: Left;    PERIPHERAL VASCULAR CATHETERIZATION  10/09/2014   Procedure: Lower Extremity Intervention;  Surgeon: Algernon Huxley, MD;  Location: [place] [facility];  Service: Cardiovascular;;     Social History   Tobacco Use   Smoking status: Former Smoker    Packs/day: 1.00    Years: 25.00    Pack years: 25.00    Types: Cigarettes    Quit date: 03/24/1985    Years since quitting: 34.5   Smokeless tobacco: Former Counsellor Use: Never used  Substance Use Topics   Alcohol use: No    Alcohol/week: 0.0 standard drinks   Drug use: No      Family History  Problem Relation Age of Onset   Heart disease Sister    Heart disease Sister    Diabetes  Sister    Diabetes Sister    Diabetes Brother     Allergies  Allergen Reactions   Oxycodone Nausea Only     REVIEW OF SYSTEMS (Negative unless checked)  Constitutional: [] ?Weight loss  [] ?Fever  [] ?Chills Cardiac: [] ?Chest pain   [] ?Chest pressure   [x] ?Palpitations   [] ?Shortness of breath when laying flat   [] ?Shortness of breath at rest   [x] ?Shortness of breath with exertion. Vascular:  [x] ?Pain in legs with walking   [x] ?Pain in legs at rest   [x] ?Pain in legs when laying flat   [] ?Claudication   [] ?Pain in feet when walking  [] ?Pain in feet at rest  [] ?Pain in feet when laying flat   [] ?History of DVT   [] ?Phlebitis   [x] ?Swelling in legs   [] ?Varicose veins   [x] ?Non-healing ulcers Pulmonary:   [] ?Uses home oxygen   [] ?Productive cough   [] ?Hemoptysis   [] ?Wheeze  [] ?COPD   [] ?Asthma Neurologic:  [] ?Dizziness  [] ?Blackouts   [] ?Seizures   [] ?History of stroke   [] ?History of TIA  [] ?Aphasia   [] ?Temporary blindness   [] ?Dysphagia   [] ?Weakness or numbness in arms   [] ?Weakness or numbness in legs Musculoskeletal:  [x] ?Arthritis   [] ?Joint swelling   [x] ?Joint pain   [] ?Low back pain Hematologic:  [] ?Easy bruising  [] ?Easy bleeding   [] ?Hypercoagulable state   [] ?Anemic   Gastrointestinal:  [] ?Blood in stool    [] ?Vomiting blood  [x] ?Gastroesophageal reflux/heartburn   [] ?Abdominal pain Genitourinary:  [x] ?Chronic kidney disease   [] ?Difficult urination  [] ?Frequent urination  [] ?Burning with urination   [] ?Hematuria Skin:  [] ?Rashes   [x] ?Ulcers   [x] ?Wounds Psychological:  [] ?History of anxiety   [] ? History of major depression.  Physical Examination  BP (!) 70/56 (BP Location: Left Arm)    Pulse (!) 59    Resp 14  Gen:  WD/WN, NAD Head: [place]/AT, No temporalis wasting. Ear/Nose/Throat: Hearing grossly intact, nares w/o erythema or drainage Eyes: Conjunctiva clear. Sclera non-icteric Neck: Supple.  Trachea midline Pulmonary:  Good air movement, no use of accessory muscles.  Cardiac: Irregular Vascular:  Vessel Right Left  Radial Palpable Palpable                          PT  not palpable  trace palpable  DP  1+ palpable  1+ palpable    Musculoskeletal: M/S 5/5 throughout.  No deformity or atrophy.  Superficial ulceration little bigger than a quarter on the proximal medial right calf.  Significantly improved lower extremity edema with currently only 1+ bilateral lower extremity edema.  Marked stasis dermatitis changes are present bilaterally.  Dry scab on the tip of the right great toe. Neurologic: Sensation grossly intact in extremities.  Symmetrical.  Speech is fluent.  Psychiatric: Judgment intact, Mood & affect appropriate for pt's clinical situation. Dermatologic: Lower extremity wounds as above       Labs Recent Results (from the past 2160 hour(s))  Comprehensive metabolic panel     Status: Abnormal   Collection Time: 07/21/19  1:15 PM  Result Value Ref Range   Sodium 138 135 - 145 mmol/L   Potassium 4.0 3.5 - 5.1 mmol/L   Chloride 103 98 - 111 mmol/L   CO2 27 22 - 32 mmol/L   Glucose, Bld 165 (H) 70 - 99 mg/dL    Comment: Glucose reference range applies only to samples taken after fasting for at least 8 hours.   BUN 31 (H) 8 - 23 mg/dL  Creatinine, Ser 1.87 (H) 0.61 -  1.24 mg/dL   Calcium 9.1 8.9 - 10.3 mg/dL   Total Protein 6.2 (L) 6.5 - 8.1 g/dL   Albumin 3.4 (L) 3.5 - 5.0 g/dL   AST 35 15 - 41 U/L   ALT 31 0 - 44 U/L   Alkaline Phosphatase 75 38 - 126 U/L   Total Bilirubin 1.4 (H) 0.3 - 1.2 mg/dL   GFR calc non Af Amer 33 (L) >60 mL/min   GFR calc Af Amer 38 (L) >60 mL/min   Anion gap 8 5 - 15    Comment: Performed at [facility], [place], [place] [address]  Troponin I (High Sensitivity)     Status: Abnormal   Collection Time: 07/21/19  1:15 PM  Result Value Ref Range   Troponin I (High Sensitivity) 46 (H) <18 ng/L    Comment: (NOTE) Elevated high sensitivity troponin I (hsTnI) values and significant  changes across serial measurements may suggest ACS but many other  chronic and acute conditions are known to elevate hsTnI results.  Refer to the "Links" section for chest pain algorithms and additional  guidance. Performed at [facility], [address] [place], [place] [address]   CBC with Differential     Status: Abnormal   Collection Time: 07/21/19  1:15 PM  Result Value Ref Range   WBC 8.3 4.0 - 10.5 K/uL   RBC 4.60 4.22 - 5.81 MIL/uL   Hemoglobin 13.5 13.0 - 17.0 g/dL   HCT 41.1 39 - 52 %   MCV 89.3 80.0 - 100.0 fL   MCH 29.3 26.0 - 34.0 pg   MCHC 32.8 30.0 - 36.0 g/dL   RDW 15.8 (H) 11.5 - 15.5 %   Platelets 163 150 - 400 K/uL   nRBC 0.0 0.0 - 0.2 %   Neutrophils Relative % 80 %   Neutro Abs 6.6 1.7 - 7.7 K/uL   Lymphocytes Relative 11 %   Lymphs Abs 0.9 0.7 - 4.0 K/uL   Monocytes Relative 7 %   Monocytes Absolute 0.6 0 - 1 K/uL   Eosinophils Relative 1 %   Eosinophils Absolute 0.1 0 - 0 K/uL   Basophils Relative 0 %   Basophils Absolute 0.0 0 - 0 K/uL   Immature Granulocytes 1 %   Abs Immature Granulocytes 0.07 0.00 - 0.07 K/uL    Comment: Performed at [facility], [address] [place], [place] [address]  Protime-INR     Status: Abnormal   Collection Time: 07/21/19   1:15 PM  Result Value Ref Range   Prothrombin Time 17.6 (H) 11.4 - 15.2 seconds   INR 1.5 (H) 0.8 - 1.2    Comment: (NOTE) INR goal varies based on device and disease states. Performed at [facility], [address] [place], [place] [address]   Brain natriuretic peptide     Status: Abnormal   Collection Time: 07/21/19  1:15 PM  Result Value Ref Range   B Natriuretic Peptide 3,835.0 (H) 0.0 - 100.0 pg/mL    Comment: Performed at [facility], [place], [place] [address]  Troponin I (High Sensitivity)     Status: Abnormal   Collection Time: 07/21/19  2:09 PM  Result Value Ref Range   Troponin I (High Sensitivity) 44 (H) <18 ng/L    Comment: (NOTE) Elevated high sensitivity troponin I (hsTnI) values and significant  changes across serial measurements may suggest ACS but many  other  chronic and acute conditions are known to elevate hsTnI results.  Refer to the "Links" section for chest pain algorithms and additional  guidance. Performed at [facility], [address] [place], [place] [address]   Respiratory Panel by RT PCR (Flu A&B, Covid) - Nasopharyngeal Swab     Status: None   Collection Time: 07/21/19  3:26 PM   Specimen: Nasopharyngeal Swab  Result Value Ref Range   SARS Coronavirus 2 by RT PCR NEGATIVE NEGATIVE    Comment: (NOTE) SARS-CoV-2 target nucleic acids are NOT DETECTED. The SARS-CoV-2 RNA is generally detectable in upper respiratoy specimens during the acute phase of infection. The lowest concentration of SARS-CoV-2 viral copies this assay can detect is 131 copies/mL. A negative result does not preclude SARS-Cov-2 infection and should not be used as the sole basis for treatment or other patient management decisions. A negative result may occur with  improper specimen collection/handling, submission of specimen other than nasopharyngeal swab, presence of viral mutation(s) within the areas targeted by this assay, and  inadequate number of viral copies (<131 copies/mL). A negative result must be combined with clinical observations, patient history, and epidemiological information. The expected result is Negative. Fact Sheet for Patients:  PinkCheek.be Fact Sheet for Healthcare Providers:  GravelBags.it This test is not yet ap proved or cleared by the [place] FDA and  has been authorized for detection and/or diagnosis of SARS-CoV-2 by FDA under an Emergency Use Authorization (EUA). This EUA will remain  in effect (meaning this test can be used) for the duration of the COVID-19 declaration under Section 564(b)(1) of the Act, 21 U.S.C. section 360bbb-3(b)(1), unless the authorization is terminated or revoked sooner.    Influenza A by PCR NEGATIVE NEGATIVE   Influenza B by PCR NEGATIVE NEGATIVE    Comment: (NOTE) The Xpert Xpress SARS-CoV-2/FLU/RSV assay is intended as an aid in  the diagnosis of influenza from Nasopharyngeal swab specimens and  should not be used as a sole basis for treatment. Nasal washings and  aspirates are unacceptable for Xpert Xpress SARS-CoV-2/FLU/RSV  testing. Fact Sheet for Patients: PinkCheek.be Fact Sheet for Healthcare Providers: GravelBags.it This test is not yet approved or cleared by the [place] FDA and  has been authorized for detection and/or diagnosis of SARS-CoV-2 by  FDA under an Emergency Use Authorization (EUA). This EUA will remain  in effect (meaning this test can be used) for the duration of the  Covid-19 declaration under Section 564(b)(1) of the Act, 21  U.S.C. section 360bbb-3(b)(1), unless the authorization is  terminated or revoked. Performed at [facility], [address] [place], [place] [address]   Glucose, capillary     Status: Abnormal   Collection Time: 07/21/19  4:32 PM  Result Value Ref Range    Glucose-Capillary 166 (H) 70 - 99 mg/dL    Comment: Glucose reference range applies only to samples taken after fasting for at least 8 hours.  Troponin I (High Sensitivity)     Status: Abnormal   Collection Time: 07/21/19  7:05 PM  Result Value Ref Range   Troponin I (High Sensitivity) 42 (H) <18 ng/L    Comment: (NOTE) Elevated high sensitivity troponin I (hsTnI) values and significant  changes across serial measurements may suggest ACS but many other  chronic and acute conditions are known to elevate hsTnI results.  Refer to the "Links" section for chest pain algorithms and additional  guidance. Performed at [facility], [address] [place],  [place] [address]   CK     Status: None   Collection Time: 07/21/19  7:05 PM  Result Value Ref Range   Total CK 268 49.0 - 397.0 U/L    Comment: Performed at [facility], [address] [place], [place] [address]  Glucose, capillary     Status: Abnormal   Collection Time: 07/21/19  9:03 PM  Result Value Ref Range   Glucose-Capillary 174 (H) 70 - 99 mg/dL    Comment: Glucose reference range applies only to samples taken after fasting for at least 8 hours.  Basic metabolic panel     Status: Abnormal   Collection Time: 07/22/19  4:22 AM  Result Value Ref Range   Sodium 137 135 - 145 mmol/L   Potassium 3.7 3.5 - 5.1 mmol/L   Chloride 104 98 - 111 mmol/L   CO2 25 22 - 32 mmol/L   Glucose, Bld 137 (H) 70 - 99 mg/dL    Comment: Glucose reference range applies only to samples taken after fasting for at least 8 hours.   BUN 29 (H) 8 - 23 mg/dL   Creatinine, Ser 1.63 (H) 0.61 - 1.24 mg/dL   Calcium 8.6 (L) 8.9 - 10.3 mg/dL   GFR calc non Af Amer 39 (L) >60 mL/min   GFR calc Af Amer 45 (L) >60 mL/min   Anion gap 8 5 - 15    Comment: Performed at [facility], [address] [place], [place] [address]  CBC     Status: Abnormal   Collection Time: 07/22/19  4:22 AM  Result Value Ref Range   WBC 7.3 4.0 - 10.5  K/uL   RBC 4.06 (L) 4.22 - 5.81 MIL/uL   Hemoglobin 12.1 (L) 13.0 - 17.0 g/dL   HCT 36.2 (L) 39 - 52 %   MCV 89.2 80.0 - 100.0 fL   MCH 29.8 26.0 - 34.0 pg   MCHC 33.4 30.0 - 36.0 g/dL   RDW 15.9 (H) 11.5 - 15.5 %   Platelets 147 (L) 150 - 400 K/uL   nRBC 0.0 0.0 - 0.2 %    Comment: Performed at [facility], [address] [place], [place] [address]  Glucose, capillary     Status: Abnormal   Collection Time: 07/22/19  7:42 AM  Result Value Ref Range   Glucose-Capillary 119 (H) 70 - 99 mg/dL    Comment: Glucose reference range applies only to samples taken after fasting for at least 8 hours.  ECHOCARDIOGRAM COMPLETE     Status: None   Collection Time: 07/22/19 10:19 AM  Result Value Ref Range   Weight 2,856 oz   Height 71 in   BP 125/63 mmHg  Glucose, capillary     Status: Abnormal   Collection Time: 07/22/19 12:03 PM  Result Value Ref Range   Glucose-Capillary 133 (H) 70 - 99 mg/dL    Comment: Glucose reference range applies only to samples taken after fasting for at least 8 hours.  Glucose, capillary     Status: Abnormal   Collection Time: 07/22/19  4:13 PM  Result Value Ref Range   Glucose-Capillary 122 (H) 70 - 99 mg/dL    Comment: Glucose reference range applies only to samples taken after fasting for at least 8 hours.  Glucose, capillary     Status: Abnormal   Collection Time: 07/22/19  9:11 PM  Result Value Ref Range   Glucose-Capillary 111 (H) 70 - 99 mg/dL    Comment: Glucose  reference range applies only to samples taken after fasting for at least 8 hours.  Basic metabolic panel     Status: Abnormal   Collection Time: 07/23/19  4:13 AM  Result Value Ref Range   Sodium 140 135 - 145 mmol/L   Potassium 3.4 (L) 3.5 - 5.1 mmol/L   Chloride 104 98 - 111 mmol/L   CO2 28 22 - 32 mmol/L   Glucose, Bld 122 (H) 70 - 99 mg/dL    Comment: Glucose reference range applies only to samples taken after fasting for at least 8 hours.   BUN 26 (H) 8 - 23 mg/dL    Creatinine, Ser 1.40 (H) 0.61 - 1.24 mg/dL   Calcium 8.5 (L) 8.9 - 10.3 mg/dL   GFR calc non Af Amer 47 (L) >60 mL/min   GFR calc Af Amer 55 (L) >60 mL/min   Anion gap 8 5 - 15    Comment: Performed at [facility], [address] [place], [place] [address]  CBC     Status: Abnormal   Collection Time: 07/23/19  4:13 AM  Result Value Ref Range   WBC 7.7 4.0 - 10.5 K/uL   RBC 4.26 4.22 - 5.81 MIL/uL   Hemoglobin 12.6 (L) 13.0 - 17.0 g/dL   HCT 38.1 (L) 39 - 52 %   MCV 89.4 80.0 - 100.0 fL   MCH 29.6 26.0 - 34.0 pg   MCHC 33.1 30.0 - 36.0 g/dL   RDW 15.9 (H) 11.5 - 15.5 %   Platelets 146 (L) 150 - 400 K/uL   nRBC 0.0 0.0 - 0.2 %    Comment: Performed at [facility], [address], [place], [place] [address]  Magnesium     Status: None   Collection Time: 07/23/19  4:13 AM  Result Value Ref Range   Magnesium 2.0 1.7 - 2.4 mg/dL    Comment: Performed at [facility], [address] [place], [place] [address]  Glucose, capillary     Status: None   Collection Time: 07/23/19  7:16 AM  Result Value Ref Range   Glucose-Capillary 94 70 - 99 mg/dL    Comment: Glucose reference range applies only to samples taken after fasting for at least 8 hours.  Glucose, capillary     Status: Abnormal   Collection Time: 07/23/19 12:09 PM  Result Value Ref Range   Glucose-Capillary 194 (H) 70 - 99 mg/dL    Comment: Glucose reference range applies only to samples taken after fasting for at least 8 hours.  Aerobic Culture (superficial specimen)     Status: None   Collection Time: 09/02/19  2:20 PM   Specimen: Foot  Result Value Ref Range   Specimen Description      FOOT RIGHT Performed at [facility], [address], [place], [place] [address]    Special Requests      NONE Performed at [facility], [place], [place] [address]    Gram Stain      NO WBC SEEN ABUNDANT GRAM NEGATIVE COCCOBACILLI FEW GRAM POSITIVE COCCI Performed  at [facility], [place] [address], [place],  [address]    Culture      ABUNDANT ENTEROBACTER CLOACAE FEW ENTEROCOCCUS FAECALIS    Report Status 09/06/2019 FINAL    Organism ID, Bacteria ENTEROBACTER CLOACAE    Organism ID, Bacteria ENTEROCOCCUS FAECALIS       Susceptibility   Enterobacter cloacae - MIC*  CEFAZOLIN >=64 RESISTANT Resistant     CEFEPIME <=1 SENSITIVE Sensitive     CEFTAZIDIME <=1 SENSITIVE Sensitive     CEFTRIAXONE <=1 SENSITIVE Sensitive     CIPROFLOXACIN <=0.25 SENSITIVE Sensitive     GENTAMICIN <=1 SENSITIVE Sensitive     IMIPENEM 0.5 SENSITIVE Sensitive     TRIMETH/SULFA <=20 SENSITIVE Sensitive     PIP/TAZO <=4 SENSITIVE Sensitive     * ABUNDANT ENTEROBACTER CLOACAE   Enterococcus faecalis - MIC*    AMPICILLIN <=2 SENSITIVE Sensitive     VANCOMYCIN 1 SENSITIVE Sensitive     GENTAMICIN SYNERGY SENSITIVE Sensitive     * FEW ENTEROCOCCUS FAECALIS  Lactic acid, plasma     Status: Abnormal   Collection Time: 09/09/19  2:12 PM  Result Value Ref Range   Lactic Acid, Venous 2.8 (HH) 0.5 - 1.9 mmol/L    Comment: CRITICAL RESULT CALLED TO, READ BACK BY AND VERIFIED WITH TIFFANY JOHNSON 09/09/19 1453 KBH Performed at [facility], [address] [place], [place] [address]   Comprehensive metabolic panel     Status: Abnormal   Collection Time: 09/09/19  2:12 PM  Result Value Ref Range   Sodium 138 135 - 145 mmol/L   Potassium 4.5 3.5 - 5.1 mmol/L   Chloride 104 98 - 111 mmol/L   CO2 22 22 - 32 mmol/L   Glucose, Bld 204 (H) 70 - 99 mg/dL    Comment: Glucose reference range applies only to samples taken after fasting for at least 8 hours.   BUN 35 (H) 8 - 23 mg/dL   Creatinine, Ser 1.68 (H) 0.61 - 1.24 mg/dL   Calcium 9.0 8.9 - 10.3 mg/dL   Total Protein 6.5 6.5 - 8.1 g/dL   Albumin 3.0 (L) 3.5 - 5.0 g/dL   AST 188 (H) 15 - 41 U/L   ALT 97 (H) 0 - 44 U/L   Alkaline Phosphatase 89 38 - 126 U/L   Total Bilirubin 2.0 (H) 0.3 - 1.2 mg/dL    GFR calc non Af Amer 38 (L) >60 mL/min   GFR calc Af Amer 44 (L) >60 mL/min   Anion gap 12 5 - 15    Comment: Performed at [facility], [address] [place], [place] [address]  CBC with Differential     Status: Abnormal   Collection Time: 09/09/19  2:12 PM  Result Value Ref Range   WBC 12.7 (H) 4.0 - 10.5 K/uL   RBC 4.75 4.22 - 5.81 MIL/uL   Hemoglobin 14.2 13.0 - 17.0 g/dL   HCT 42.1 39 - 52 %   MCV 88.6 80.0 - 100.0 fL   MCH 29.9 26.0 - 34.0 pg   MCHC 33.7 30.0 - 36.0 g/dL   RDW 17.1 (H) 11.5 - 15.5 %   Platelets 196 150 - 400 K/uL   nRBC 0.0 0.0 - 0.2 %   Neutrophils Relative % 87 %   Neutro Abs 11.2 (H) 1.7 - 7.7 K/uL   Lymphocytes Relative 7 %   Lymphs Abs 0.8 0.7 - 4.0 K/uL   Monocytes Relative 5 %   Monocytes Absolute 0.6 0 - 1 K/uL   Eosinophils Relative 0 %   Eosinophils Absolute 0.0 0 - 0 K/uL   Basophils Relative 0 %   Basophils Absolute 0.0 0 - 0 K/uL   Immature Granulocytes 1 %   Abs Immature Granulocytes 0.06 0.00 - 0.07 K/uL    Comment: Performed at [facility], [address] [place], [place] [address]  Urinalysis, Complete w Microscopic     Status: Abnormal   Collection Time: 09/09/19  2:58 PM  Result Value Ref Range   Color, Urine AMBER (A) YELLOW    Comment: BIOCHEMICALS MAY BE AFFECTED BY COLOR   APPearance HAZY (A) CLEAR   Specific Gravity, Urine 1.026 1.005 - 1.030   pH 5.0 5.0 - 8.0   Glucose, UA NEGATIVE NEGATIVE mg/dL   Hgb urine dipstick NEGATIVE NEGATIVE   Bilirubin Urine NEGATIVE NEGATIVE   Ketones, ur NEGATIVE NEGATIVE mg/dL   Protein, ur 100 (A) NEGATIVE mg/dL   Nitrite NEGATIVE NEGATIVE   Leukocytes,Ua NEGATIVE NEGATIVE   RBC / HPF 0-5 0 - 5 RBC/hpf   WBC, UA 0-5 0 - 5 WBC/hpf   Bacteria, UA NONE SEEN NONE SEEN   Squamous Epithelial / LPF 0-5 0 - 5   Mucus PRESENT    Hyaline Casts, UA PRESENT     Comment: Performed at [facility], [address] [place], [place] [address]  Lactic acid, plasma      Status: Abnormal   Collection Time: 09/09/19  8:06 PM  Result Value Ref Range   Lactic Acid, Venous 2.5 (HH) 0.5 - 1.9 mmol/L    Comment: CRITICAL VALUE NOTED. VALUE IS CONSISTENT WITH PREVIOUSLY REPORTED/CALLED VALUE [facility] Performed at [facility], [address] [place], [place] [address]   Blood culture (routine x 2)     Status: None   Collection Time: 09/09/19  8:06 PM   Specimen: BLOOD  Result Value Ref Range   Specimen Description BLOOD LEFT ANTECUBITAL    Special Requests      BOTTLES DRAWN AEROBIC AND ANAEROBIC Blood Culture results may not be optimal due to an inadequate volume of blood received in culture bottles   Culture      NO GROWTH 5 DAYS Performed at [facility], [address], [place], [place] [address]    Report Status 09/14/2019 FINAL   Blood culture (routine x 2)     Status: None   Collection Time: 09/09/19  8:06 PM   Specimen: BLOOD  Result Value Ref Range   Specimen Description BLOOD LEFT HAND    Special Requests      BOTTLES DRAWN AEROBIC AND ANAEROBIC Blood Culture adequate volume   Culture      NO GROWTH 5 DAYS Performed at [facility], [address], [place], [place] [address]    Report Status 09/14/2019 FINAL   SARS Coronavirus 2 by RT PCR (hospital order, performed in [place] hospital lab) Nasopharyngeal Nasopharyngeal Swab     Status: None   Collection Time: 09/09/19 10:15 PM   Specimen: Nasopharyngeal Swab  Result Value Ref Range   SARS Coronavirus 2 NEGATIVE NEGATIVE    Comment: (NOTE) SARS-CoV-2 target nucleic acids are NOT DETECTED.  The SARS-CoV-2 RNA is generally detectable in upper and lower respiratory specimens during the acute phase of infection. The lowest concentration of SARS-CoV-2 viral copies this assay can detect is 250 copies / mL. A negative result does not preclude SARS-CoV-2 infection and should not be used as the sole basis for treatment or other patient management decisions.  A  negative result may occur with improper specimen collection / handling, submission of specimen other than nasopharyngeal swab, presence of viral mutation(s) within the areas targeted by this assay, and inadequate number of viral copies (<250 copies / mL). A negative result must be combined with clinical observations, patient history,  and epidemiological information.  Fact Sheet for Patients:   StrictlyIdeas.no  Fact Sheet for Healthcare Providers: BankingDealers.co.za  This test is not yet approved or  cleared by the [place] FDA and has been authorized for detection and/or diagnosis of SARS-CoV-2 by FDA under an Emergency Use Authorization (EUA).  This EUA will remain in effect (meaning this test can be used) for the duration of the COVID-19 declaration under Section 564(b)(1) of the Act, 21 U.S.C. section 360bbb-3(b)(1), unless the authorization is terminated or revoked sooner.  Performed at [facility], [address] [place], [place] [address]   Comprehensive metabolic panel     Status: Abnormal   Collection Time: 09/10/19  5:20 AM  Result Value Ref Range   Sodium 139 135 - 145 mmol/L   Potassium 4.9 3.5 - 5.1 mmol/L   Chloride 107 98 - 111 mmol/L   CO2 22 22 - 32 mmol/L   Glucose, Bld 179 (H) 70 - 99 mg/dL    Comment: Glucose reference range applies only to samples taken after fasting for at least 8 hours.   BUN 44 (H) 8 - 23 mg/dL   Creatinine, Ser 1.77 (H) 0.61 - 1.24 mg/dL   Calcium 8.4 (L) 8.9 - 10.3 mg/dL   Total Protein 4.6 (L) 6.5 - 8.1 g/dL   Albumin 2.2 (L) 3.5 - 5.0 g/dL   AST 81 (H) 15 - 41 U/L   ALT 65 (H) 0 - 44 U/L   Alkaline Phosphatase 63 38 - 126 U/L   Total Bilirubin 1.3 (H) 0.3 - 1.2 mg/dL   GFR calc non Af Amer 35 (L) >60 mL/min   GFR calc Af Amer 41 (L) >60 mL/min   Anion gap 10 5 - 15    Comment: Performed at [facility], [address] [place], [place] [address]  CBC      Status: Abnormal   Collection Time: 09/10/19  5:20 AM  Result Value Ref Range   WBC 14.1 (H) 4.0 - 10.5 K/uL   RBC 4.97 4.22 - 5.81 MIL/uL   Hemoglobin 15.1 13.0 - 17.0 g/dL   HCT 43.6 39 - 52 %   MCV 87.7 80.0 - 100.0 fL   MCH 30.4 26.0 - 34.0 pg   MCHC 34.6 30.0 - 36.0 g/dL   RDW 17.3 (H) 11.5 - 15.5 %   Platelets 169 150 - 400 K/uL   nRBC 0.0 0.0 - 0.2 %    Comment: Performed at [facility], [address], [place], [place] [address]  Basic metabolic panel     Status: Abnormal   Collection Time: 09/11/19  5:07 AM  Result Value Ref Range   Sodium 138 135 - 145 mmol/L   Potassium 3.6 3.5 - 5.1 mmol/L   Chloride 107 98 - 111 mmol/L   CO2 26 22 - 32 mmol/L   Glucose, Bld 141 (H) 70 - 99 mg/dL    Comment: Glucose reference range applies only to samples taken after fasting for at least 8 hours.   BUN 44 (H) 8 - 23 mg/dL   Creatinine, Ser 1.63 (H) 0.61 - 1.24 mg/dL   Calcium 7.8 (L) 8.9 - 10.3 mg/dL   GFR calc non Af Amer 39 (L) >60 mL/min   GFR calc Af Amer 45 (L) >60 mL/min   Anion gap 5 5 - 15    Comment: Performed at [facility], [address] [place], [place] [address]  Vancomycin, trough  Status: None   Collection Time: 09/11/19  9:37 PM  Result Value Ref Range   Vancomycin Tr 16 15 - 20 ug/mL    Comment: Performed at [facility] [address], [place], [place] [address]  Basic metabolic panel     Status: Abnormal   Collection Time: 09/12/19  3:38 AM  Result Value Ref Range   Sodium 138 135 - 145 mmol/L   Potassium 3.5 3.5 - 5.1 mmol/L   Chloride 107 98 - 111 mmol/L   CO2 25 22 - 32 mmol/L   Glucose, Bld 210 (H) 70 - 99 mg/dL    Comment: Glucose reference range applies only to samples taken after fasting for at least 8 hours.   BUN 39 (H) 8 - 23 mg/dL   Creatinine, Ser 1.36 (H) 0.61 - 1.24 mg/dL   Calcium 7.6 (L) 8.9 - 10.3 mg/dL   GFR calc non Af Amer 49 (L) >60 mL/min   GFR calc Af Amer 57 (L) >60 mL/min   Anion gap 6 5 - 15     Comment: Performed at [facility], [address] [place], [place] [address]  Hemoglobin A1c     Status: Abnormal   Collection Time: 09/12/19  3:41 AM  Result Value Ref Range   Hgb A1c MFr Bld 7.7 (H) 4.8 - 5.6 %    Comment: (NOTE)         Prediabetes: 5.7 - 6.4         Diabetes: >6.4         Glycemic control for adults with diabetes: <7.0    Mean Plasma Glucose 174 mg/dL    Comment: (NOTE) Performed At: [facility] [address] [place], [place] [id number] Rush Byrom MD [id number]   Glucose, capillary     Status: Abnormal   Collection Time: 09/12/19  9:15 AM  Result Value Ref Range   Glucose-Capillary 158 (H) 70 - 99 mg/dL    Comment: Glucose reference range applies only to samples taken after fasting for at least 8 hours.  Glucose, capillary     Status: None   Collection Time: 09/12/19 11:32 AM  Result Value Ref Range   Glucose-Capillary 89 70 - 99 mg/dL    Comment: Glucose reference range applies only to samples taken after fasting for at least 8 hours.  Glucose, capillary     Status: None   Collection Time: 09/12/19  2:16 PM  Result Value Ref Range   Glucose-Capillary 72 70 - 99 mg/dL    Comment: Glucose reference range applies only to samples taken after fasting for at least 8 hours.  Glucose, capillary     Status: Abnormal   Collection Time: 09/12/19  4:31 PM  Result Value Ref Range   Glucose-Capillary 139 (H) 70 - 99 mg/dL    Comment: Glucose reference range applies only to samples taken after fasting for at least 8 hours.  Glucose, capillary     Status: Abnormal   Collection Time: 09/12/19  8:48 PM  Result Value Ref Range   Glucose-Capillary 147 (H) 70 - 99 mg/dL    Comment: Glucose reference range applies only to samples taken after fasting for at least 8 hours.  Basic metabolic panel     Status: Abnormal   Collection Time: 09/13/19  4:13 AM  Result Value Ref Range   Sodium 138 135 - 145 mmol/L   Potassium 3.3 (L) 3.5 - 5.1 mmol/L    Chloride 105 98 - 111 mmol/L  CO2 27 22 - 32 mmol/L   Glucose, Bld 184 (H) 70 - 99 mg/dL    Comment: Glucose reference range applies only to samples taken after fasting for at least 8 hours.   BUN 33 (H) 8 - 23 mg/dL   Creatinine, Ser 1.23 0.61 - 1.24 mg/dL   Calcium 7.7 (L) 8.9 - 10.3 mg/dL   GFR calc non Af Amer 55 (L) >60 mL/min   GFR calc Af Amer >60 >60 mL/min   Anion gap 6 5 - 15    Comment: Performed at [facility], [address], [place], [place] [address]  Magnesium     Status: Abnormal   Collection Time: 09/13/19  4:13 AM  Result Value Ref Range   Magnesium 1.6 (L) 1.7 - 2.4 mg/dL    Comment: Performed at [facility], [address] [place], [place] [address]  Glucose, capillary     Status: Abnormal   Collection Time: 09/13/19  7:50 AM  Result Value Ref Range   Glucose-Capillary 134 (H) 70 - 99 mg/dL    Comment: Glucose reference range applies only to samples taken after fasting for at least 8 hours.  Glucose, capillary     Status: Abnormal   Collection Time: 09/13/19 12:00 PM  Result Value Ref Range   Glucose-Capillary 136 (H) 70 - 99 mg/dL    Comment: Glucose reference range applies only to samples taken after fasting for at least 8 hours.  Glucose, capillary     Status: Abnormal   Collection Time: 09/13/19  4:34 PM  Result Value Ref Range   Glucose-Capillary 69 (L) 70 - 99 mg/dL    Comment: Glucose reference range applies only to samples taken after fasting for at least 8 hours.  Glucose, capillary     Status: None   Collection Time: 09/13/19  6:12 PM  Result Value Ref Range   Glucose-Capillary 86 70 - 99 mg/dL    Comment: Glucose reference range applies only to samples taken after fasting for at least 8 hours.  Glucose, capillary     Status: Abnormal   Collection Time: 09/13/19  9:29 PM  Result Value Ref Range   Glucose-Capillary 122 (H) 70 - 99 mg/dL    Comment: Glucose reference range applies only to samples taken after fasting for at  least 8 hours.   Comment 1 Notify RN   Basic metabolic panel     Status: Abnormal   Collection Time: 09/14/19  5:46 AM  Result Value Ref Range   Sodium 139 135 - 145 mmol/L   Potassium 3.5 3.5 - 5.1 mmol/L   Chloride 103 98 - 111 mmol/L   CO2 28 22 - 32 mmol/L   Glucose, Bld 158 (H) 70 - 99 mg/dL    Comment: Glucose reference range applies only to samples taken after fasting for at least 8 hours.   BUN 33 (H) 8 - 23 mg/dL   Creatinine, Ser 1.24 0.61 - 1.24 mg/dL   Calcium 7.8 (L) 8.9 - 10.3 mg/dL   GFR calc non Af Amer 55 (L) >60 mL/min   GFR calc Af Amer >60 >60 mL/min   Anion gap 8 5 - 15    Comment: Performed at [facility], [address] [place], [place] [address]  Glucose, capillary     Status: Abnormal   Collection Time: 09/14/19  8:11 AM  Result Value Ref Range   Glucose-Capillary 127 (H) 70 - 99 mg/dL    Comment: Glucose reference  range applies only to samples taken after fasting for at least 8 hours.  Glucose, capillary     Status: None   Collection Time: 09/14/19 12:05 PM  Result Value Ref Range   Glucose-Capillary 96 70 - 99 mg/dL    Comment: Glucose reference range applies only to samples taken after fasting for at least 8 hours.   Comment 1 Notify RN   Glucose, capillary     Status: Abnormal   Collection Time: 09/14/19  3:59 PM  Result Value Ref Range   Glucose-Capillary 124 (H) 70 - 99 mg/dL    Comment: Glucose reference range applies only to samples taken after fasting for at least 8 hours.   Comment 1 Notify RN   Glucose, capillary     Status: Abnormal   Collection Time: 09/14/19  9:05 PM  Result Value Ref Range   Glucose-Capillary 187 (H) 70 - 99 mg/dL    Comment: Glucose reference range applies only to samples taken after fasting for at least 8 hours.   Comment 1 Notify RN   Basic metabolic panel     Status: Abnormal   Collection Time: 09/15/19  5:49 AM  Result Value Ref Range   Sodium 141 135 - 145 mmol/L   Potassium 3.6 3.5 - 5.1 mmol/L    Chloride 103 98 - 111 mmol/L   CO2 31 22 - 32 mmol/L   Glucose, Bld 123 (H) 70 - 99 mg/dL    Comment: Glucose reference range applies only to samples taken after fasting for at least 8 hours.   BUN 36 (H) 8 - 23 mg/dL   Creatinine, Ser 1.68 (H) 0.61 - 1.24 mg/dL   Calcium 7.9 (L) 8.9 - 10.3 mg/dL   GFR calc non Af Amer 38 (L) >60 mL/min   GFR calc Af Amer 44 (L) >60 mL/min   Anion gap 7 5 - 15    Comment: Performed at [facility], [address] [place], [place] [address]  Glucose, capillary     Status: Abnormal   Collection Time: 09/15/19  7:37 AM  Result Value Ref Range   Glucose-Capillary 110 (H) 70 - 99 mg/dL    Comment: Glucose reference range applies only to samples taken after fasting for at least 8 hours.  Glucose, capillary     Status: Abnormal   Collection Time: 09/15/19 12:04 PM  Result Value Ref Range   Glucose-Capillary 204 (H) 70 - 99 mg/dL    Comment: Glucose reference range applies only to samples taken after fasting for at least 8 hours.   Comment 1 Notify RN   SARS CORONAVIRUS 2 (TAT 6-24 HRS) Nasopharyngeal Nasopharyngeal Swab     Status: None   Collection Time: 09/15/19 12:24 PM   Specimen: Nasopharyngeal Swab  Result Value Ref Range   SARS Coronavirus 2 NEGATIVE NEGATIVE    Comment: (NOTE) SARS-CoV-2 target nucleic acids are NOT DETECTED.  The SARS-CoV-2 RNA is generally detectable in upper and lower respiratory specimens during the acute phase of infection. Negative results do not preclude SARS-CoV-2 infection, do not rule out co-infections with other pathogens, and should not be used as the sole basis for treatment or other patient management decisions. Negative results must be combined with clinical observations, patient history, and epidemiological information. The expected result is Negative.  Fact Sheet for Patients: SugarRoll.be  Fact Sheet for Healthcare  Providers: https://www.woods-mathews.com/  This test is not yet approved or cleared by the [place] FDA and  has been authorized for  detection and/or diagnosis of SARS-CoV-2 by FDA under an Emergency Use Authorization (EUA). This EUA will remain  in effect (meaning this test can be used) for the duration of the COVID-19 declaration under Se ction 564(b)(1) of the Act, 21 U.S.C. section 360bbb-3(b)(1), unless the authorization is terminated or revoked sooner.  Performed at [facility], [place] [address], , [place] [address]   Glucose, capillary     Status: Abnormal   Collection Time: 09/15/19  4:10 PM  Result Value Ref Range   Glucose-Capillary 142 (H) 70 - 99 mg/dL    Comment: Glucose reference range applies only to samples taken after fasting for at least 8 hours.   Comment 1 Notify RN   Glucose, capillary     Status: Abnormal   Collection Time: 09/15/19  8:58 PM  Result Value Ref Range   Glucose-Capillary 146 (H) 70 - 99 mg/dL    Comment: Glucose reference range applies only to samples taken after fasting for at least 8 hours.   Comment 1 Notify RN   Basic metabolic panel     Status: Abnormal   Collection Time: 09/16/19  5:04 AM  Result Value Ref Range   Sodium 138 135 - 145 mmol/L   Potassium 3.5 3.5 - 5.1 mmol/L   Chloride 100 98 - 111 mmol/L   CO2 28 22 - 32 mmol/L   Glucose, Bld 169 (H) 70 - 99 mg/dL    Comment: Glucose reference range applies only to samples taken after fasting for at least 8 hours.   BUN 37 (H) 8 - 23 mg/dL   Creatinine, Ser 1.58 (H) 0.61 - 1.24 mg/dL   Calcium 7.8 (L) 8.9 - 10.3 mg/dL   GFR calc non Af Amer 41 (L) >60 mL/min   GFR calc Af Amer 47 (L) >60 mL/min   Anion gap 10 5 - 15    Comment: Performed at [facility], [address] [place],  [address]  Glucose, capillary     Status: Abnormal   Collection Time: 09/16/19  7:50 AM  Result Value Ref Range   Glucose-Capillary 126 (H) 70 - 99 mg/dL     Comment: Glucose reference range applies only to samples taken after fasting for at least 8 hours.   Comment 1 Notify RN     Radiology MR FOOT RIGHT WO CONTRAST  Result Date: 09/10/2019 CLINICAL DATA:  Chronic soft tissue ulcerations of the foot. EXAM: MRI OF THE RIGHT FOREFOOT WITHOUT CONTRAST TECHNIQUE: Multiplanar, multisequence MR imaging of the right forefoot was performed. No intravenous contrast was administered. COMPARISON:  Radiographs dated 09/01/2019 FINDINGS: Bones/Joint/Cartilage There is osteomyelitis of the distal phalanx of the great toe with focal destruction of a portion of the tuft of the distal phalanx. No other significant bone abnormality. Muscles and Tendons No significant abnormalities. Soft tissues Nonspecific slight edema on the dorsum of the foot as well as slight edema in the subcutaneous fat of the plantar aspect of the foot. No discrete soft tissue abscesses. 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 Electronically Signed   By: Lorriane Shire M.D.   On: 09/10/2019 14:20   MR FOOT LEFT WO CONTRAST  Result Date: 09/10/2019 CLINICAL DATA:  Soft tissue ulcerations of the left foot. EXAM: MRI OF THE LEFT FOREFOOT WITHOUT CONTRAST TECHNIQUE: Multiplanar, multisequence MR imaging of the left midfoot and forefoot was performed. No intravenous contrast was administered. COMPARISON:  Radiographs dated 09/01/2019 FINDINGS: Bones/Joint/Cartilage There is abnormal edema in  the head of the third metatarsal and the base of the proximal phalanx of the third toe. No discrete bone destruction. There is a deep soft tissue ulceration underlying the head of the third metatarsal. The bones of the midfoot and forefoot are otherwise normal. Muscles and Tendons There is a focal fluid collection surrounding the extensor tendon to the third toe at the level of the distal shaft of the metatarsal, best seen on image 28 of series 4, likely representing an abscess. The fluid  collection extends toward the soft tissue ulceration Soft tissues Nonspecific subcutaneous edema in the forefoot. No discrete soft tissue abscesses. IMPRESSION: Osteomyelitis of the head of the third metatarsal and the base of the proximal phalanx of the third toe. Tenosynovitis of the flexor tendon of the third toe as described above. This is likely infectious. Electronically Signed   By: Lorriane Shire M.D.   On: 09/10/2019 14:26   PERIPHERAL VASCULAR CATHETERIZATION  Result Date: 09/12/2019 See op note  US Venous Img Lower Bilateral  Result Date: 09/09/2019 CLINICAL DATA:  Bilateral lower extremity swelling. EXAM: BILATERAL LOWER EXTREMITY VENOUS DOPPLER ULTRASOUND TECHNIQUE: Gray-scale sonography with compression, as well as color and duplex ultrasound, were performed to evaluate the deep venous system(s) from the level of the common femoral vein through the popliteal and proximal calf veins. COMPARISON:  None. FINDINGS: VENOUS Normal compressibility of the common femoral, superficial femoral, and popliteal veins, as well as the visualized calf veins. Visualized portions of profunda femoral vein and great saphenous vein unremarkable. No filling defects to suggest DVT on grayscale or color Doppler imaging. Doppler waveforms show normal direction of venous flow, normal respiratory plasticity and response to augmentation. Limited views of the contralateral common femoral vein are unremarkable. OTHER Bilateral lower extremity edema is noted. Limitations: none IMPRESSION: 1. No evidence of DVT within the right lower extremity or left lower extremity. 2. Mild diffuse bilateral lower extremity edema. Electronically Signed   By: Virgina Norfolk M.D.   On: 09/09/2019 20:25   DG Foot Complete Left  Result Date: 09/02/2019 CLINICAL DATA:  Diabetic foot ulcer with osteomyelitis. EXAM: LEFT FOOT - COMPLETE 3+ VIEW COMPARISON:  None. FINDINGS: Diffuse soft tissue swelling, most pronounced dorsally and distally.  Small superficial soft tissue defects lateral to the 5th MTP joint. No soft tissue gas, bone destruction or periosteal reaction. Moderate inferior calcaneal spur formation IMPRESSION: Soft tissue ulceration lateral to the 5th MTP joint without radiographic evidence of underlying osteomyelitis. Electronically Signed   By: Claudie Revering M.D.   On: 09/02/2019 15:06   DG Foot Complete Right  Result Date: 09/02/2019 CLINICAL DATA:  Diabetic foot ulcer with osteomyelitis. EXAM: RIGHT FOOT COMPLETE - 3+ VIEW COMPARISON:  None. FINDINGS: Diffuse soft tissue swelling, most pronounced distally. Small soft tissue defect in the distal aspect of the great toe. Ill-defined bone destruction and fragmentation involving the 1st distal tuft. No soft tissue gas or fracture. Minimal 1st MTP joint degenerative changes. Mild inferior and mild moderate posterior calcaneal spur formation. IMPRESSION: 1. Osteomyelitis involving the 1st distal tuft. These results will be called to the ordering clinician or representative by the Radiologist Assistant, and communication documented in the PACS or [employer]. 2. Diffuse soft tissue swelling with a small ulceration in the distal aspect of the great toe. Electronically Signed   By: Claudie Revering M.D.   On: 09/02/2019 15:09   DG ESOPHAGUS W SINGLE CM (SOL OR THIN BA)  Result Date: 09/14/2019 CLINICAL DATA:  Difficulty swallowing,  particularly large pills EXAM: ESOPHOGRAM/BARIUM SWALLOW TECHNIQUE: Single contrast examination was performed using thin barium. Barium impregnated tablet was administered as well. FLUOROSCOPY TIME:  Fluoroscopy Time:  2 minutes 54 seconds Radiation Exposure Index (if provided by the fluoroscopic device): 83.1 mGy Number of Acquired Spot Images: 32, multiple cine fluoroscopic runs COMPARISON:  None. FINDINGS: Swallowing mechanism and esophageal transit time are within normal limits. Esophageal mucosa is within normal limits. No definitive reflux is seen.  Minimal tertiary contractions are noted. No focal area of narrowing is seen. Barium impregnated tablet was administered and held briefly in the mid esophagus although no true obstructive changes seen. This reproduced symptoms similar to what the patient is experiencing. IMPRESSION: Mild tertiary contractions are noted. Mild functional obstruction which may be related to the size and dryness of the tablet within the mid esophagus. This did reproduce the patient's symptomatology although no definitive obstructive changes are seen. Electronically Signed   By: Inez Catalina M.D.   On: 09/14/2019 11:44    Assessment/Plan Type 2 diabetes mellitus with stage 3 chronic kidney disease (HCC) blood glucose control important in reducing the progression of atherosclerotic disease. Also, involved in wound healing. On appropriate medications.   Hypertension blood pressure control important in reducing the progression of atherosclerotic disease. On appropriate oral medications.  CKD (chronic kidney disease), stage III Had an angiogram with contrast last week.  Angiogram on the left leg can be considered, but we will need to limit contrast if we do this.  Protein-calorie malnutrition, severe Pushing p.o. intake would help his lower extremity swelling.  He is having a difficult time eating secondary to a dental injury from a fall last month.  Swelling of limb Would continue to wrap his legs in Unna boots until all the wounds have healed and the swelling is under control and can be managed with compression socks.  A 3 layer Unna boot was placed on both lower extremities today.  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  On the left, his flow was only mildly reduced on noninvasive studies earlier this year and he should have adequate  flow for wound healing with digit pressures in the 80s. Overall fairly difficult situation with a high risk of limb loss.  Continued conservative therapy for now seems to be the most appropriate option.  The family is going to be transitioning his care to [facility] or [place].  I will be happy to discuss his case with whoever assumes his care in those locations we will be happy to provide his records.  I have taken care of him for well over a decade now he has a very extensive vascular history with multiple previous procedures.  For now, we will continue to wrap his legs in Unna boots to keep the swelling and ulceration under control.  Continue his current medical regimen.    Leotis Pain, MD  09/20/2019 3:16 PM    This note was created with Dragon medical transcription system.  Any errors from dictation are purely unintentional

## 2019-09-20 NOTE — Assessment & Plan Note (Signed)
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  On the left, his flow was only mildly reduced on noninvasive studies earlier this year and he should have adequate flow for wound healing with digit pressures in the 80s. Overall fairly difficult situation with a high risk of limb loss.  Continued conservative therapy for now seems to be the most appropriate option.  The family is going to be transitioning his care to [facility] or [place].  I will be happy to discuss his case with whoever assumes his care in those locations we will be happy to provide his records.  I have taken care of him for well over a decade now he has a very extensive vascular history with multiple previous procedures.  For now, we will continue to wrap his legs in Unna boots to keep the swelling and ulceration under control.  Continue his current medical regimen.

## 2019-09-20 NOTE — Assessment & Plan Note (Signed)
Would continue to wrap his legs in Unna boots until all the wounds have healed and the swelling is under control and can be managed with compression socks.  A 3 layer Unna boot was placed on both lower extremities today.

## 2019-09-22 DIAGNOSIS — I739 Peripheral vascular disease, unspecified: Secondary | ICD-10-CM | POA: Diagnosis not present

## 2019-09-22 DIAGNOSIS — I255 Ischemic cardiomyopathy: Secondary | ICD-10-CM | POA: Diagnosis not present

## 2019-09-22 DIAGNOSIS — Z79899 Other long term (current) drug therapy: Secondary | ICD-10-CM | POA: Diagnosis not present

## 2019-09-22 DIAGNOSIS — R6520 Severe sepsis without septic shock: Secondary | ICD-10-CM | POA: Diagnosis not present

## 2019-09-22 DIAGNOSIS — I472 Ventricular tachycardia: Secondary | ICD-10-CM | POA: Diagnosis not present

## 2019-09-22 DIAGNOSIS — M86171 Other acute osteomyelitis, right ankle and foot: Secondary | ICD-10-CM | POA: Diagnosis not present

## 2019-09-22 DIAGNOSIS — I950 Idiopathic hypotension: Secondary | ICD-10-CM | POA: Diagnosis not present

## 2019-09-22 DIAGNOSIS — R627 Adult failure to thrive: Secondary | ICD-10-CM | POA: Diagnosis not present

## 2019-09-22 DIAGNOSIS — I5023 Acute on chronic systolic (congestive) heart failure: Secondary | ICD-10-CM | POA: Diagnosis not present

## 2019-09-22 DIAGNOSIS — Z48812 Encounter for surgical aftercare following surgery on the circulatory system: Secondary | ICD-10-CM | POA: Diagnosis not present

## 2019-09-22 DIAGNOSIS — K5900 Constipation, unspecified: Secondary | ICD-10-CM | POA: Diagnosis not present

## 2019-09-22 DIAGNOSIS — R1312 Dysphagia, oropharyngeal phase: Secondary | ICD-10-CM | POA: Diagnosis not present

## 2019-09-22 DIAGNOSIS — I4510 Unspecified right bundle-branch block: Secondary | ICD-10-CM | POA: Diagnosis not present

## 2019-09-22 DIAGNOSIS — L97529 Non-pressure chronic ulcer of other part of left foot with unspecified severity: Secondary | ICD-10-CM | POA: Diagnosis not present

## 2019-09-22 DIAGNOSIS — E876 Hypokalemia: Secondary | ICD-10-CM | POA: Diagnosis not present

## 2019-09-22 DIAGNOSIS — L97526 Non-pressure chronic ulcer of other part of left foot with bone involvement without evidence of necrosis: Secondary | ICD-10-CM | POA: Diagnosis not present

## 2019-09-22 DIAGNOSIS — I2510 Atherosclerotic heart disease of native coronary artery without angina pectoris: Secondary | ICD-10-CM | POA: Diagnosis not present

## 2019-09-22 DIAGNOSIS — E1169 Type 2 diabetes mellitus with other specified complication: Secondary | ICD-10-CM | POA: Diagnosis not present

## 2019-09-22 DIAGNOSIS — Z951 Presence of aortocoronary bypass graft: Secondary | ICD-10-CM | POA: Diagnosis not present

## 2019-09-22 DIAGNOSIS — M86071 Acute hematogenous osteomyelitis, right ankle and foot: Secondary | ICD-10-CM | POA: Diagnosis not present

## 2019-09-22 DIAGNOSIS — N179 Acute kidney failure, unspecified: Secondary | ICD-10-CM | POA: Diagnosis not present

## 2019-09-22 DIAGNOSIS — Z20822 Contact with and (suspected) exposure to covid-19: Secondary | ICD-10-CM | POA: Diagnosis not present

## 2019-09-22 DIAGNOSIS — A419 Sepsis, unspecified organism: Secondary | ICD-10-CM | POA: Diagnosis not present

## 2019-09-22 DIAGNOSIS — E1069 Type 1 diabetes mellitus with other specified complication: Secondary | ICD-10-CM | POA: Diagnosis not present

## 2019-09-22 DIAGNOSIS — D72829 Elevated white blood cell count, unspecified: Secondary | ICD-10-CM | POA: Diagnosis not present

## 2019-09-22 DIAGNOSIS — T148XXA Other injury of unspecified body region, initial encounter: Secondary | ICD-10-CM | POA: Diagnosis not present

## 2019-09-22 DIAGNOSIS — I130 Hypertensive heart and chronic kidney disease with heart failure and stage 1 through stage 4 chronic kidney disease, or unspecified chronic kidney disease: Secondary | ICD-10-CM | POA: Diagnosis not present

## 2019-09-22 DIAGNOSIS — E1165 Type 2 diabetes mellitus with hyperglycemia: Secondary | ICD-10-CM | POA: Diagnosis not present

## 2019-09-22 DIAGNOSIS — I70213 Atherosclerosis of native arteries of extremities with intermittent claudication, bilateral legs: Secondary | ICD-10-CM | POA: Diagnosis not present

## 2019-09-22 DIAGNOSIS — E43 Unspecified severe protein-calorie malnutrition: Secondary | ICD-10-CM | POA: Diagnosis not present

## 2019-09-22 DIAGNOSIS — Z87891 Personal history of nicotine dependence: Secondary | ICD-10-CM | POA: Diagnosis not present

## 2019-09-22 DIAGNOSIS — I501 Left ventricular failure: Secondary | ICD-10-CM | POA: Diagnosis not present

## 2019-09-22 DIAGNOSIS — I442 Atrioventricular block, complete: Secondary | ICD-10-CM | POA: Diagnosis not present

## 2019-09-22 DIAGNOSIS — M86172 Other acute osteomyelitis, left ankle and foot: Secondary | ICD-10-CM | POA: Diagnosis not present

## 2019-09-22 DIAGNOSIS — I872 Venous insufficiency (chronic) (peripheral): Secondary | ICD-10-CM | POA: Diagnosis not present

## 2019-09-22 DIAGNOSIS — K7200 Acute and subacute hepatic failure without coma: Secondary | ICD-10-CM | POA: Diagnosis not present

## 2019-09-22 DIAGNOSIS — I493 Ventricular premature depolarization: Secondary | ICD-10-CM | POA: Diagnosis not present

## 2019-09-22 DIAGNOSIS — L97919 Non-pressure chronic ulcer of unspecified part of right lower leg with unspecified severity: Secondary | ICD-10-CM | POA: Diagnosis not present

## 2019-09-22 DIAGNOSIS — I7389 Other specified peripheral vascular diseases: Secondary | ICD-10-CM | POA: Diagnosis not present

## 2019-09-22 DIAGNOSIS — I10 Essential (primary) hypertension: Secondary | ICD-10-CM | POA: Diagnosis not present

## 2019-09-22 DIAGNOSIS — I462 Cardiac arrest due to underlying cardiac condition: Secondary | ICD-10-CM | POA: Diagnosis not present

## 2019-09-22 DIAGNOSIS — I214 Non-ST elevation (NSTEMI) myocardial infarction: Secondary | ICD-10-CM | POA: Diagnosis not present

## 2019-09-22 DIAGNOSIS — Z515 Encounter for palliative care: Secondary | ICD-10-CM | POA: Diagnosis not present

## 2019-09-22 DIAGNOSIS — I959 Hypotension, unspecified: Secondary | ICD-10-CM | POA: Diagnosis not present

## 2019-09-22 DIAGNOSIS — N183 Chronic kidney disease, stage 3 unspecified: Secondary | ICD-10-CM | POA: Diagnosis not present

## 2019-09-22 DIAGNOSIS — I083 Combined rheumatic disorders of mitral, aortic and tricuspid valves: Secondary | ICD-10-CM | POA: Diagnosis not present

## 2019-09-22 DIAGNOSIS — L97519 Non-pressure chronic ulcer of other part of right foot with unspecified severity: Secondary | ICD-10-CM | POA: Diagnosis not present

## 2019-09-22 DIAGNOSIS — E1152 Type 2 diabetes mellitus with diabetic peripheral angiopathy with gangrene: Secondary | ICD-10-CM | POA: Diagnosis not present

## 2019-09-22 DIAGNOSIS — I219 Acute myocardial infarction, unspecified: Secondary | ICD-10-CM | POA: Diagnosis not present

## 2019-09-22 DIAGNOSIS — Z006 Encounter for examination for normal comparison and control in clinical research program: Secondary | ICD-10-CM | POA: Diagnosis not present

## 2019-09-22 DIAGNOSIS — E785 Hyperlipidemia, unspecified: Secondary | ICD-10-CM | POA: Diagnosis not present

## 2019-09-22 DIAGNOSIS — Z7984 Long term (current) use of oral hypoglycemic drugs: Secondary | ICD-10-CM | POA: Diagnosis not present

## 2019-09-22 DIAGNOSIS — E1122 Type 2 diabetes mellitus with diabetic chronic kidney disease: Secondary | ICD-10-CM | POA: Diagnosis not present

## 2019-09-22 DIAGNOSIS — E11621 Type 2 diabetes mellitus with foot ulcer: Secondary | ICD-10-CM | POA: Diagnosis not present

## 2019-09-22 DIAGNOSIS — R918 Other nonspecific abnormal finding of lung field: Secondary | ICD-10-CM | POA: Diagnosis not present

## 2019-09-22 DIAGNOSIS — R531 Weakness: Secondary | ICD-10-CM | POA: Diagnosis not present

## 2019-09-22 DIAGNOSIS — I96 Gangrene, not elsewhere classified: Secondary | ICD-10-CM | POA: Diagnosis not present

## 2019-09-22 DIAGNOSIS — D6959 Other secondary thrombocytopenia: Secondary | ICD-10-CM | POA: Diagnosis not present

## 2019-09-22 DIAGNOSIS — Z950 Presence of cardiac pacemaker: Secondary | ICD-10-CM | POA: Diagnosis not present

## 2019-09-22 DIAGNOSIS — L97419 Non-pressure chronic ulcer of right heel and midfoot with unspecified severity: Secondary | ICD-10-CM | POA: Diagnosis not present

## 2019-09-22 DIAGNOSIS — I252 Old myocardial infarction: Secondary | ICD-10-CM | POA: Diagnosis not present

## 2019-10-06 DIAGNOSIS — I219 Acute myocardial infarction, unspecified: Secondary | ICD-10-CM | POA: Diagnosis not present

## 2019-10-06 DIAGNOSIS — I214 Non-ST elevation (NSTEMI) myocardial infarction: Secondary | ICD-10-CM | POA: Diagnosis not present

## 2019-10-06 DIAGNOSIS — E1165 Type 2 diabetes mellitus with hyperglycemia: Secondary | ICD-10-CM | POA: Diagnosis not present

## 2019-10-06 DIAGNOSIS — I739 Peripheral vascular disease, unspecified: Secondary | ICD-10-CM | POA: Diagnosis not present

## 2019-10-06 DIAGNOSIS — I358 Other nonrheumatic aortic valve disorders: Secondary | ICD-10-CM | POA: Diagnosis not present

## 2019-10-06 DIAGNOSIS — I96 Gangrene, not elsewhere classified: Secondary | ICD-10-CM | POA: Diagnosis not present

## 2019-10-06 DIAGNOSIS — M86171 Other acute osteomyelitis, right ankle and foot: Secondary | ICD-10-CM | POA: Diagnosis not present

## 2019-10-06 DIAGNOSIS — E039 Hypothyroidism, unspecified: Secondary | ICD-10-CM | POA: Diagnosis not present

## 2019-10-06 DIAGNOSIS — N183 Chronic kidney disease, stage 3 unspecified: Secondary | ICD-10-CM | POA: Diagnosis not present

## 2019-10-06 DIAGNOSIS — N138 Other obstructive and reflux uropathy: Secondary | ICD-10-CM | POA: Diagnosis not present

## 2019-10-06 DIAGNOSIS — I950 Idiopathic hypotension: Secondary | ICD-10-CM | POA: Diagnosis not present

## 2019-10-06 DIAGNOSIS — T368X5A Adverse effect of other systemic antibiotics, initial encounter: Secondary | ICD-10-CM | POA: Diagnosis not present

## 2019-10-06 DIAGNOSIS — L89152 Pressure ulcer of sacral region, stage 2: Secondary | ICD-10-CM | POA: Diagnosis not present

## 2019-10-06 DIAGNOSIS — E1069 Type 1 diabetes mellitus with other specified complication: Secondary | ICD-10-CM | POA: Diagnosis not present

## 2019-10-06 DIAGNOSIS — E1151 Type 2 diabetes mellitus with diabetic peripheral angiopathy without gangrene: Secondary | ICD-10-CM | POA: Diagnosis not present

## 2019-10-06 DIAGNOSIS — E785 Hyperlipidemia, unspecified: Secondary | ICD-10-CM | POA: Diagnosis not present

## 2019-10-06 DIAGNOSIS — Z66 Do not resuscitate: Secondary | ICD-10-CM | POA: Diagnosis not present

## 2019-10-06 DIAGNOSIS — D696 Thrombocytopenia, unspecified: Secondary | ICD-10-CM | POA: Diagnosis not present

## 2019-10-06 DIAGNOSIS — I872 Venous insufficiency (chronic) (peripheral): Secondary | ICD-10-CM | POA: Diagnosis not present

## 2019-10-06 DIAGNOSIS — Z48812 Encounter for surgical aftercare following surgery on the circulatory system: Secondary | ICD-10-CM | POA: Diagnosis not present

## 2019-10-06 DIAGNOSIS — Z951 Presence of aortocoronary bypass graft: Secondary | ICD-10-CM | POA: Diagnosis not present

## 2019-10-06 DIAGNOSIS — I493 Ventricular premature depolarization: Secondary | ICD-10-CM | POA: Diagnosis not present

## 2019-10-06 DIAGNOSIS — Z515 Encounter for palliative care: Secondary | ICD-10-CM | POA: Diagnosis not present

## 2019-10-06 DIAGNOSIS — R0602 Shortness of breath: Secondary | ICD-10-CM | POA: Diagnosis not present

## 2019-10-06 DIAGNOSIS — L97519 Non-pressure chronic ulcer of other part of right foot with unspecified severity: Secondary | ICD-10-CM | POA: Diagnosis not present

## 2019-10-06 DIAGNOSIS — I5023 Acute on chronic systolic (congestive) heart failure: Secondary | ICD-10-CM | POA: Diagnosis not present

## 2019-10-06 DIAGNOSIS — A419 Sepsis, unspecified organism: Secondary | ICD-10-CM | POA: Diagnosis not present

## 2019-10-06 DIAGNOSIS — Z20822 Contact with and (suspected) exposure to covid-19: Secondary | ICD-10-CM | POA: Diagnosis not present

## 2019-10-06 DIAGNOSIS — I10 Essential (primary) hypertension: Secondary | ICD-10-CM | POA: Diagnosis not present

## 2019-10-06 DIAGNOSIS — I472 Ventricular tachycardia: Secondary | ICD-10-CM | POA: Diagnosis not present

## 2019-10-06 DIAGNOSIS — G9341 Metabolic encephalopathy: Secondary | ICD-10-CM | POA: Diagnosis not present

## 2019-10-06 DIAGNOSIS — R531 Weakness: Secondary | ICD-10-CM | POA: Diagnosis not present

## 2019-10-06 DIAGNOSIS — E1169 Type 2 diabetes mellitus with other specified complication: Secondary | ICD-10-CM | POA: Diagnosis not present

## 2019-10-06 DIAGNOSIS — E43 Unspecified severe protein-calorie malnutrition: Secondary | ICD-10-CM | POA: Diagnosis not present

## 2019-10-06 DIAGNOSIS — I21A1 Myocardial infarction type 2: Secondary | ICD-10-CM | POA: Diagnosis not present

## 2019-10-06 DIAGNOSIS — Z950 Presence of cardiac pacemaker: Secondary | ICD-10-CM | POA: Diagnosis not present

## 2019-10-06 DIAGNOSIS — E11621 Type 2 diabetes mellitus with foot ulcer: Secondary | ICD-10-CM | POA: Diagnosis not present

## 2019-10-06 DIAGNOSIS — N179 Acute kidney failure, unspecified: Secondary | ICD-10-CM | POA: Diagnosis not present

## 2019-10-06 DIAGNOSIS — K7200 Acute and subacute hepatic failure without coma: Secondary | ICD-10-CM | POA: Diagnosis not present

## 2019-10-06 DIAGNOSIS — E860 Dehydration: Secondary | ICD-10-CM | POA: Diagnosis not present

## 2019-10-06 DIAGNOSIS — N170 Acute kidney failure with tubular necrosis: Secondary | ICD-10-CM | POA: Diagnosis not present

## 2019-10-06 DIAGNOSIS — I442 Atrioventricular block, complete: Secondary | ICD-10-CM | POA: Diagnosis not present

## 2019-10-06 DIAGNOSIS — M8660 Other chronic osteomyelitis, unspecified site: Secondary | ICD-10-CM | POA: Diagnosis not present

## 2019-10-06 DIAGNOSIS — E876 Hypokalemia: Secondary | ICD-10-CM | POA: Diagnosis not present

## 2019-10-06 DIAGNOSIS — L97529 Non-pressure chronic ulcer of other part of left foot with unspecified severity: Secondary | ICD-10-CM | POA: Diagnosis not present

## 2019-10-06 DIAGNOSIS — I130 Hypertensive heart and chronic kidney disease with heart failure and stage 1 through stage 4 chronic kidney disease, or unspecified chronic kidney disease: Secondary | ICD-10-CM | POA: Diagnosis not present

## 2019-10-06 DIAGNOSIS — I499 Cardiac arrhythmia, unspecified: Secondary | ICD-10-CM | POA: Diagnosis not present

## 2019-10-06 DIAGNOSIS — E1122 Type 2 diabetes mellitus with diabetic chronic kidney disease: Secondary | ICD-10-CM | POA: Diagnosis not present

## 2019-10-06 DIAGNOSIS — R1312 Dysphagia, oropharyngeal phase: Secondary | ICD-10-CM | POA: Diagnosis not present

## 2019-10-06 DIAGNOSIS — I2510 Atherosclerotic heart disease of native coronary artery without angina pectoris: Secondary | ICD-10-CM | POA: Diagnosis not present

## 2019-10-06 DIAGNOSIS — L97526 Non-pressure chronic ulcer of other part of left foot with bone involvement without evidence of necrosis: Secondary | ICD-10-CM | POA: Diagnosis not present

## 2019-10-13 DIAGNOSIS — M86171 Other acute osteomyelitis, right ankle and foot: Secondary | ICD-10-CM | POA: Diagnosis not present

## 2019-10-17 DIAGNOSIS — D696 Thrombocytopenia, unspecified: Secondary | ICD-10-CM | POA: Diagnosis not present

## 2019-10-17 DIAGNOSIS — N179 Acute kidney failure, unspecified: Secondary | ICD-10-CM | POA: Diagnosis not present

## 2019-10-17 DIAGNOSIS — N170 Acute kidney failure with tubular necrosis: Secondary | ICD-10-CM | POA: Diagnosis not present

## 2019-10-17 DIAGNOSIS — D72829 Elevated white blood cell count, unspecified: Secondary | ICD-10-CM | POA: Diagnosis not present

## 2019-10-17 DIAGNOSIS — T368X5A Adverse effect of other systemic antibiotics, initial encounter: Secondary | ICD-10-CM | POA: Diagnosis not present

## 2019-10-17 DIAGNOSIS — E039 Hypothyroidism, unspecified: Secondary | ICD-10-CM | POA: Diagnosis not present

## 2019-10-17 DIAGNOSIS — K8020 Calculus of gallbladder without cholecystitis without obstruction: Secondary | ICD-10-CM | POA: Diagnosis not present

## 2019-10-17 DIAGNOSIS — E1151 Type 2 diabetes mellitus with diabetic peripheral angiopathy without gangrene: Secondary | ICD-10-CM | POA: Diagnosis not present

## 2019-10-17 DIAGNOSIS — L89152 Pressure ulcer of sacral region, stage 2: Secondary | ICD-10-CM | POA: Diagnosis not present

## 2019-10-17 DIAGNOSIS — E1122 Type 2 diabetes mellitus with diabetic chronic kidney disease: Secondary | ICD-10-CM | POA: Diagnosis not present

## 2019-10-17 DIAGNOSIS — E11621 Type 2 diabetes mellitus with foot ulcer: Secondary | ICD-10-CM | POA: Diagnosis not present

## 2019-10-17 DIAGNOSIS — M8660 Other chronic osteomyelitis, unspecified site: Secondary | ICD-10-CM | POA: Diagnosis not present

## 2019-10-17 DIAGNOSIS — G9341 Metabolic encephalopathy: Secondary | ICD-10-CM | POA: Diagnosis not present

## 2019-10-17 DIAGNOSIS — M869 Osteomyelitis, unspecified: Secondary | ICD-10-CM | POA: Diagnosis not present

## 2019-10-17 DIAGNOSIS — L97529 Non-pressure chronic ulcer of other part of left foot with unspecified severity: Secondary | ICD-10-CM | POA: Diagnosis not present

## 2019-10-17 DIAGNOSIS — R0602 Shortness of breath: Secondary | ICD-10-CM | POA: Diagnosis not present

## 2019-10-17 DIAGNOSIS — E119 Type 2 diabetes mellitus without complications: Secondary | ICD-10-CM | POA: Diagnosis not present

## 2019-10-17 DIAGNOSIS — I739 Peripheral vascular disease, unspecified: Secondary | ICD-10-CM | POA: Diagnosis not present

## 2019-10-17 DIAGNOSIS — E860 Dehydration: Secondary | ICD-10-CM | POA: Diagnosis not present

## 2019-10-17 DIAGNOSIS — I130 Hypertensive heart and chronic kidney disease with heart failure and stage 1 through stage 4 chronic kidney disease, or unspecified chronic kidney disease: Secondary | ICD-10-CM | POA: Diagnosis not present

## 2019-10-17 DIAGNOSIS — Z66 Do not resuscitate: Secondary | ICD-10-CM | POA: Diagnosis not present

## 2019-10-17 DIAGNOSIS — I442 Atrioventricular block, complete: Secondary | ICD-10-CM | POA: Diagnosis not present

## 2019-10-17 DIAGNOSIS — I5023 Acute on chronic systolic (congestive) heart failure: Secondary | ICD-10-CM | POA: Diagnosis not present

## 2019-10-17 DIAGNOSIS — E1169 Type 2 diabetes mellitus with other specified complication: Secondary | ICD-10-CM | POA: Diagnosis not present

## 2019-10-17 DIAGNOSIS — I499 Cardiac arrhythmia, unspecified: Secondary | ICD-10-CM | POA: Diagnosis not present

## 2019-10-17 DIAGNOSIS — R9431 Abnormal electrocardiogram [ECG] [EKG]: Secondary | ICD-10-CM | POA: Diagnosis not present

## 2019-10-17 DIAGNOSIS — Z8679 Personal history of other diseases of the circulatory system: Secondary | ICD-10-CM | POA: Diagnosis not present

## 2019-10-17 DIAGNOSIS — R34 Anuria and oliguria: Secondary | ICD-10-CM | POA: Diagnosis not present

## 2019-10-17 DIAGNOSIS — I358 Other nonrheumatic aortic valve disorders: Secondary | ICD-10-CM | POA: Diagnosis not present

## 2019-10-17 DIAGNOSIS — Z20822 Contact with and (suspected) exposure to covid-19: Secondary | ICD-10-CM | POA: Diagnosis not present

## 2019-10-17 DIAGNOSIS — L97519 Non-pressure chronic ulcer of other part of right foot with unspecified severity: Secondary | ICD-10-CM | POA: Diagnosis not present

## 2019-10-17 DIAGNOSIS — I214 Non-ST elevation (NSTEMI) myocardial infarction: Secondary | ICD-10-CM | POA: Diagnosis not present

## 2019-10-17 DIAGNOSIS — I21A1 Myocardial infarction type 2: Secondary | ICD-10-CM | POA: Diagnosis not present

## 2019-10-17 DIAGNOSIS — I2510 Atherosclerotic heart disease of native coronary artery without angina pectoris: Secondary | ICD-10-CM | POA: Diagnosis not present

## 2019-10-17 DIAGNOSIS — Z515 Encounter for palliative care: Secondary | ICD-10-CM | POA: Diagnosis not present

## 2019-10-17 DIAGNOSIS — N183 Chronic kidney disease, stage 3 unspecified: Secondary | ICD-10-CM | POA: Diagnosis not present

## 2019-10-17 DIAGNOSIS — N138 Other obstructive and reflux uropathy: Secondary | ICD-10-CM | POA: Diagnosis not present

## 2019-10-17 DIAGNOSIS — Z950 Presence of cardiac pacemaker: Secondary | ICD-10-CM | POA: Diagnosis not present

## 2019-10-17 DIAGNOSIS — I472 Ventricular tachycardia: Secondary | ICD-10-CM | POA: Diagnosis not present

## 2019-12-06 ENCOUNTER — Other Ambulatory Visit: Payer: Self-pay | Admitting: *Deleted

## 2019-12-06 DIAGNOSIS — Z0000 Encounter for general adult medical examination without abnormal findings: Secondary | ICD-10-CM

## 2019-12-06 DIAGNOSIS — N401 Enlarged prostate with lower urinary tract symptoms: Secondary | ICD-10-CM

## 2019-12-06 DIAGNOSIS — I739 Peripheral vascular disease, unspecified: Secondary | ICD-10-CM

## 2019-12-06 DIAGNOSIS — N1831 Chronic kidney disease, stage 3a: Secondary | ICD-10-CM

## 2019-12-06 DIAGNOSIS — E1169 Type 2 diabetes mellitus with other specified complication: Secondary | ICD-10-CM

## 2019-12-06 DIAGNOSIS — R351 Nocturia: Secondary | ICD-10-CM

## 2019-12-06 DIAGNOSIS — E1142 Type 2 diabetes mellitus with diabetic polyneuropathy: Secondary | ICD-10-CM

## 2019-12-06 DIAGNOSIS — I129 Hypertensive chronic kidney disease with stage 1 through stage 4 chronic kidney disease, or unspecified chronic kidney disease: Secondary | ICD-10-CM

## 2019-12-06 DIAGNOSIS — E785 Hyperlipidemia, unspecified: Secondary | ICD-10-CM

## 2019-12-06 DIAGNOSIS — N183 Chronic kidney disease, stage 3 unspecified: Secondary | ICD-10-CM

## 2019-12-07 ENCOUNTER — Other Ambulatory Visit: Payer: Medicare Other

## 2019-12-13 ENCOUNTER — Encounter: Payer: Medicare Other | Admitting: Family Medicine

## 2019-12-14 ENCOUNTER — Encounter: Payer: Medicare Other | Admitting: Family Medicine

## 2020-01-13 NOTE — Telephone Encounter (Signed)
done

## 2021-07-16 IMAGING — RF DG ESOPHAGUS
7 series · 14 of 24 positions shown · non-contrast
Comparison: None.

CLINICAL DATA: Difficulty swallowing, particularly large pills

EXAM:
ESOPHOGRAM/BARIUM SWALLOW
TECHNIQUE: Single contrast examination was performed using thin barium. Barium
impregnated tablet was administered as well.
FLUOROSCOPY TIME:  Fluoroscopy Time:  2 minutes 54 seconds
Radiation Exposure Index (if provided by the fluoroscopic device):
83.1 mGy
Number of Acquired Spot Images: 32, multiple cine fluoroscopic runs

[Series 1: cp_standard · 0.17mm/px · 2 of 154 frames shown (1 of 4)]
[frame 24/154]
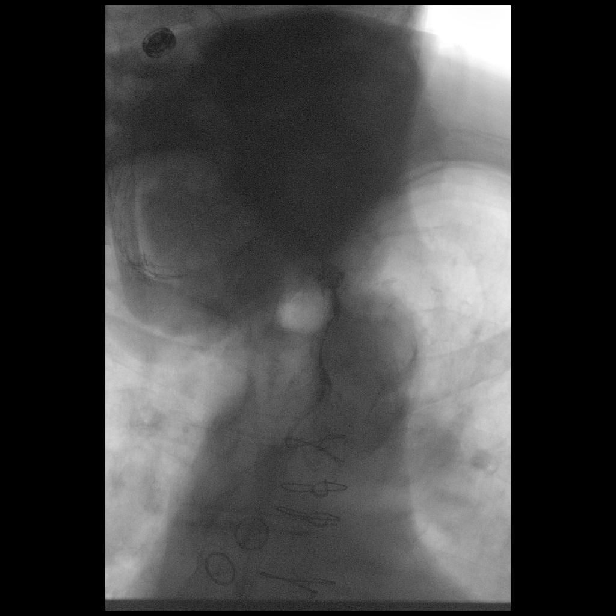
[frame 117/154]
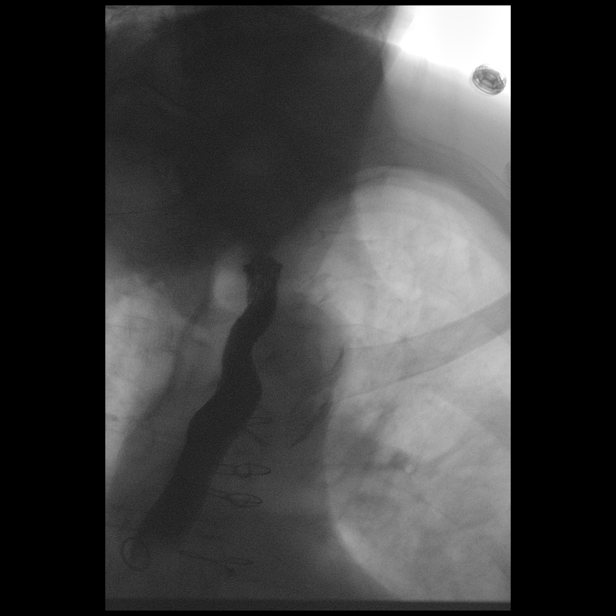

[Series 2: fluoro_barium 2fps_bw · 0.18mm/px · 2 of 5 frames shown (1 of 3)]
[frame 3/5]
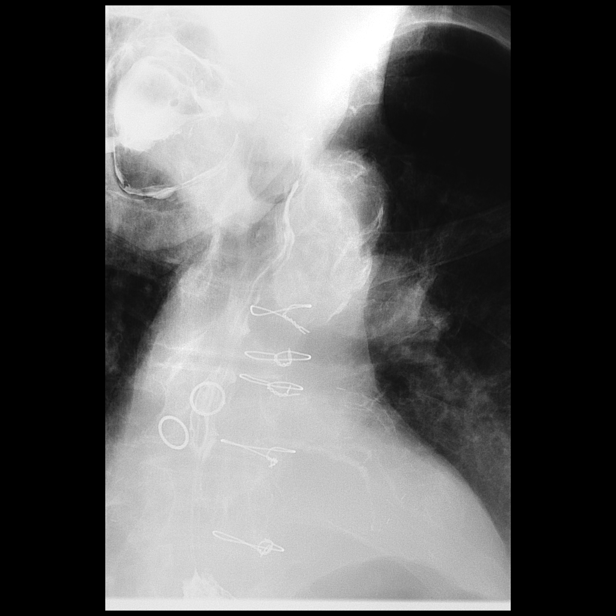
[frame 5/5]
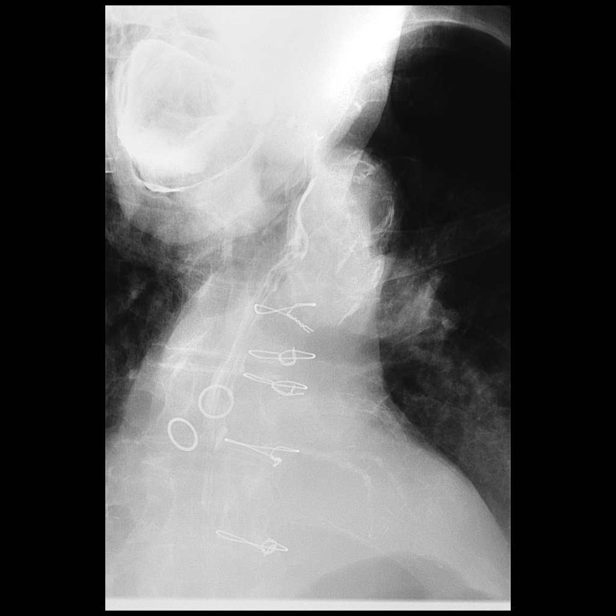

[Series 3: fluoro_barium 2fps_bw · 0.18mm/px · 2 of 8 frames shown (2 of 3)]
[frame 2/8]
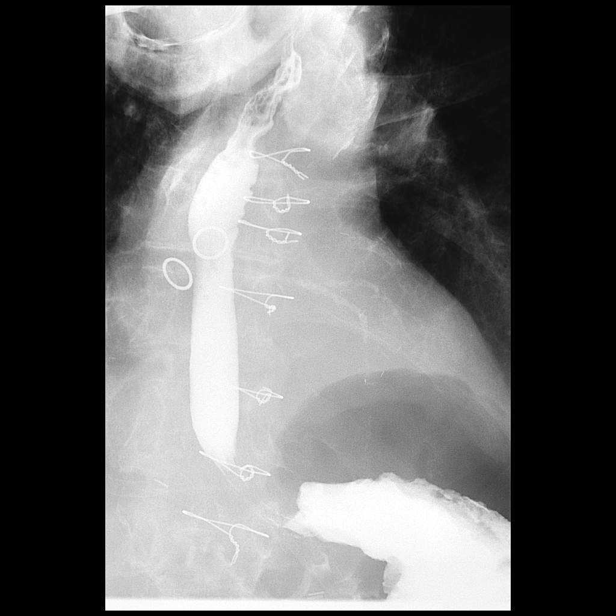
[frame 7/8]
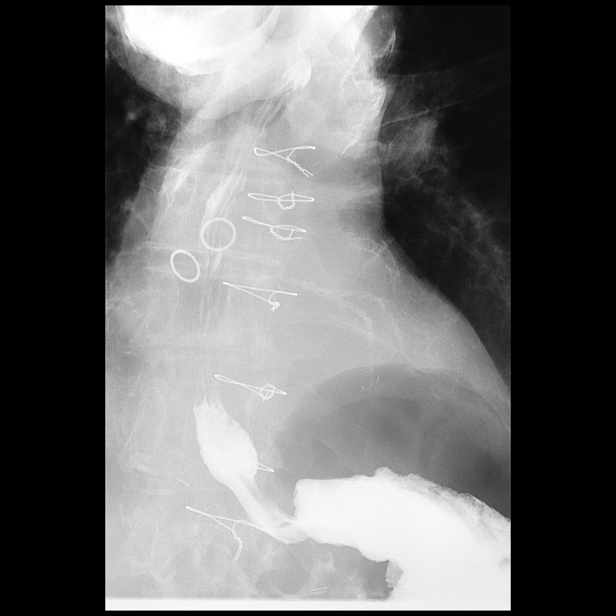

[Series 4: fluoro_barium 2fps_bw · 0.18mm/px · 2 of 19 frames shown (3 of 3)]
[frame 10/19]
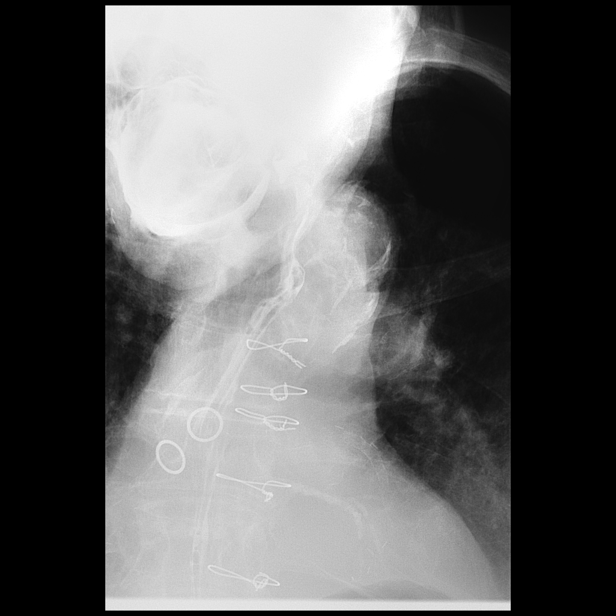
[frame 16/19]
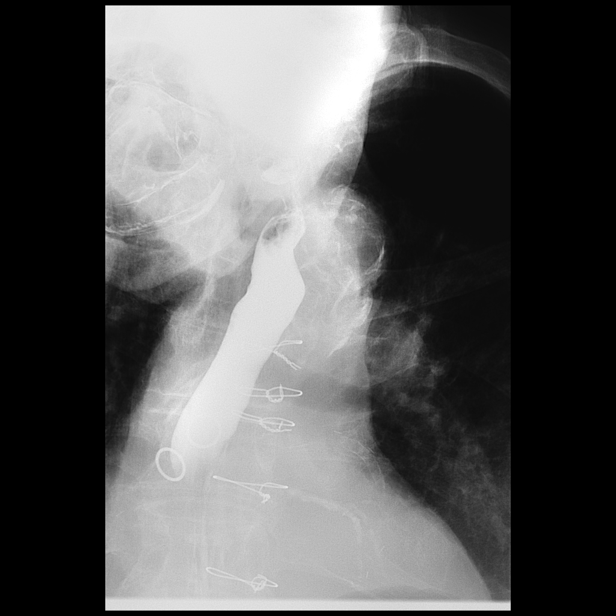

[Series 5: cp_standard · 0.18mm/px · 2 of 151 frames shown (2 of 4)]
[frame 7/151]
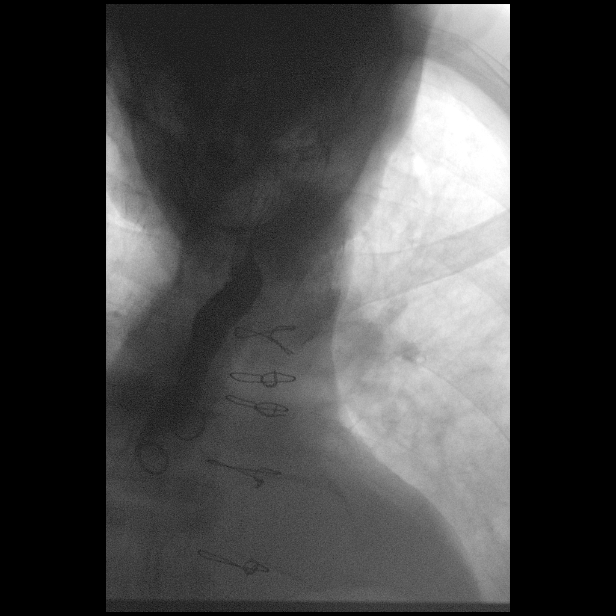
[frame 129/151]
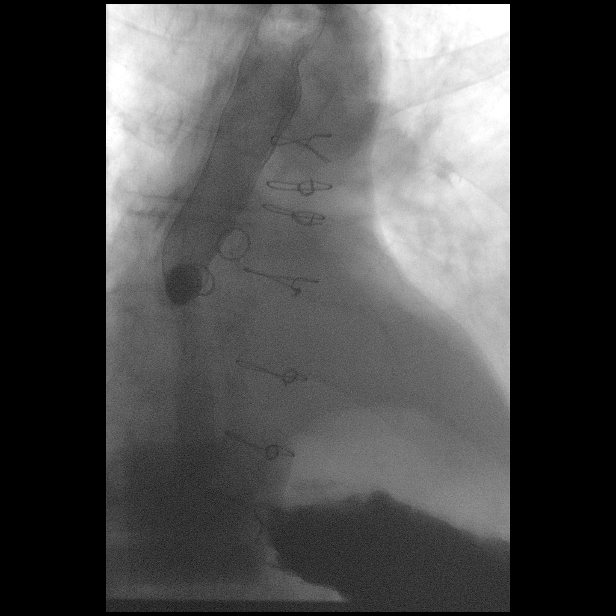

[Series 6: cp_standard · 0.18mm/px · 2 of 254 frames shown (3 of 4)]
[frame 64/254]
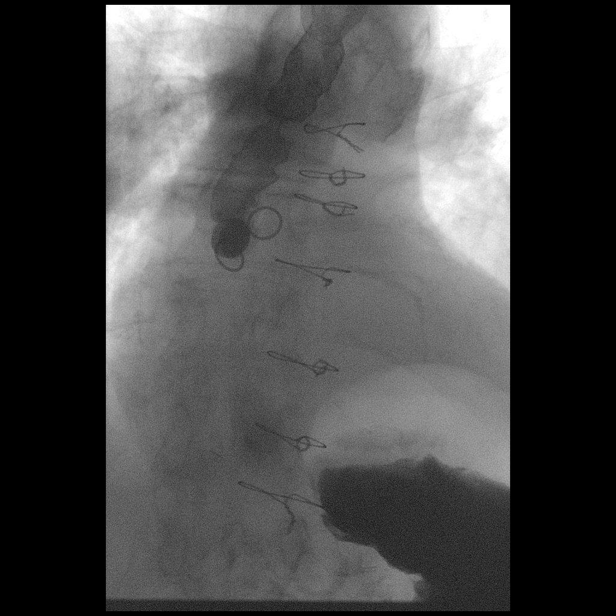
[frame 128/254]
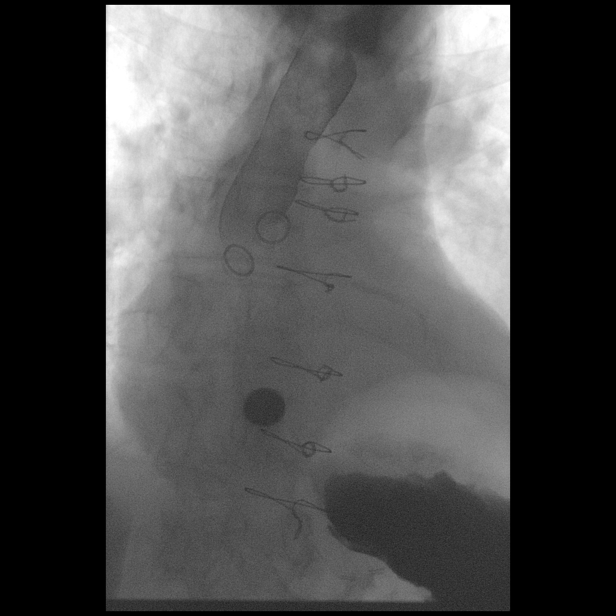

[Series 7: cp_standard · 0.18mm/px · 2 of 115 frames shown (4 of 4)]
[frame 47/115]
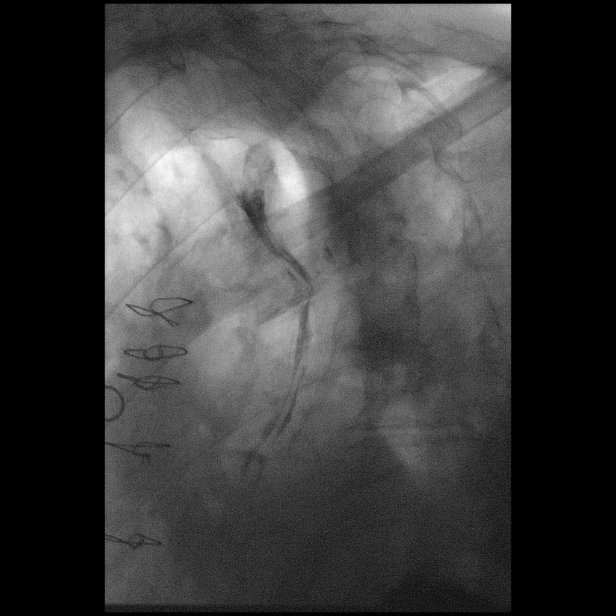
[frame 98/115]
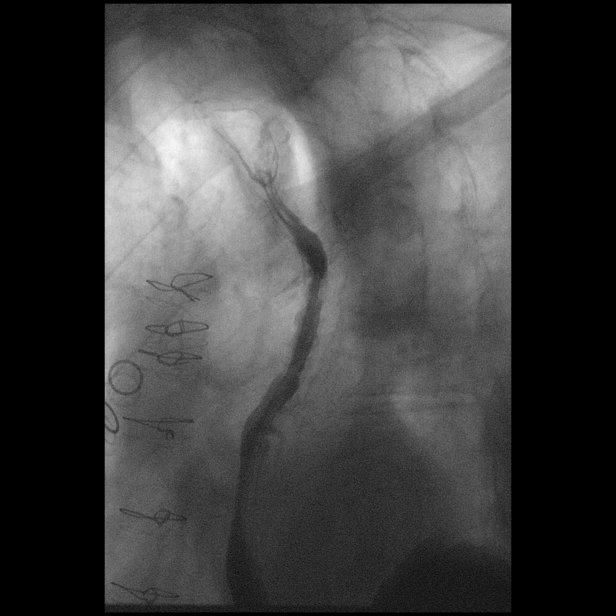

[14 of 24 positions shown; findings below may reference images not displayed]

FINDINGS: Swallowing mechanism and esophageal transit time are within normal
limits. Esophageal mucosa is within normal limits. No definitive
reflux is seen. Minimal tertiary contractions are noted. No focal
area of narrowing is seen.

Barium impregnated tablet was administered and held briefly in the
mid esophagus although no true obstructive changes seen. This
reproduced symptoms similar to what the patient is experiencing.
IMPRESSION: Mild tertiary contractions are noted.

Mild functional obstruction which may be related to the size and
dryness of the tablet within the mid esophagus. This did reproduce
the patient's symptomatology although no definitive obstructive
changes are seen.
